# Patient Record
Sex: Female | Born: 1940 | Race: White | Hispanic: No
Health system: Southern US, Academic
[De-identification: ages and names within clinical notes are randomized; demographics above are authoritative.]

## PROBLEM LIST (undated history)

## (undated) ENCOUNTER — Encounter: Attending: Hematology & Oncology | Primary: Hematology & Oncology

## (undated) ENCOUNTER — Telehealth: Attending: Adult Health | Primary: Adult Health

## (undated) ENCOUNTER — Encounter

## (undated) ENCOUNTER — Ambulatory Visit: Payer: MEDICARE

## (undated) ENCOUNTER — Ambulatory Visit

## (undated) ENCOUNTER — Encounter
Attending: Student in an Organized Health Care Education/Training Program | Primary: Student in an Organized Health Care Education/Training Program

## (undated) ENCOUNTER — Telehealth

## (undated) ENCOUNTER — Ambulatory Visit: Payer: Medicare (Managed Care)

## (undated) ENCOUNTER — Ambulatory Visit
Payer: Medicare (Managed Care) | Attending: Student in an Organized Health Care Education/Training Program | Primary: Student in an Organized Health Care Education/Training Program

## (undated) ENCOUNTER — Other Ambulatory Visit

## (undated) ENCOUNTER — Encounter: Attending: Family | Primary: Family

## (undated) ENCOUNTER — Telehealth: Attending: Hematology & Oncology | Primary: Hematology & Oncology

## (undated) ENCOUNTER — Ambulatory Visit: Payer: MEDICARE | Attending: Hematology & Oncology | Primary: Hematology & Oncology

## (undated) ENCOUNTER — Encounter: Attending: Adult Health | Primary: Adult Health

## (undated) ENCOUNTER — Ambulatory Visit: Payer: Medicare (Managed Care) | Attending: Adult Health | Primary: Adult Health

## (undated) ENCOUNTER — Telehealth
Attending: Student in an Organized Health Care Education/Training Program | Primary: Student in an Organized Health Care Education/Training Program

## (undated) ENCOUNTER — Ambulatory Visit: Payer: MEDICARE | Attending: Adult Health | Primary: Adult Health

## (undated) ENCOUNTER — Encounter: Attending: Family Medicine | Primary: Family Medicine

## (undated) ENCOUNTER — Other Ambulatory Visit: Payer: Medicare (Managed Care)

## (undated) ENCOUNTER — Telehealth: Attending: Family Medicine | Primary: Family Medicine

## (undated) ENCOUNTER — Ambulatory Visit
Payer: MEDICARE | Attending: Student in an Organized Health Care Education/Training Program | Primary: Student in an Organized Health Care Education/Training Program

## (undated) ENCOUNTER — Ambulatory Visit: Payer: MEDICARE | Attending: Internal Medicine | Primary: Internal Medicine

## (undated) ENCOUNTER — Inpatient Hospital Stay: Payer: Medicare (Managed Care)

## (undated) ENCOUNTER — Ambulatory Visit: Payer: Medicare (Managed Care) | Attending: Family Medicine | Primary: Family Medicine

## (undated) DIAGNOSIS — I10 Essential (primary) hypertension: Secondary | ICD-10-CM

## (undated) DIAGNOSIS — E119 Type 2 diabetes mellitus without complications: Secondary | ICD-10-CM

## (undated) HISTORY — PX: FOOT SURGERY: SHX648

## (undated) HISTORY — PX: CHOLECYSTECTOMY: SHX55

## (undated) HISTORY — PX: ABDOMINAL HYSTERECTOMY: SHX81

## (undated) HISTORY — PX: BACK SURGERY: SHX140

---

## 2019-07-15 ENCOUNTER — Ambulatory Visit: Admit: 2019-07-15 | Discharge: 2019-07-16 | Payer: MEDICARE

## 2019-07-15 DIAGNOSIS — E785 Hyperlipidemia, unspecified: Principal | ICD-10-CM

## 2019-07-15 DIAGNOSIS — R899 Unspecified abnormal finding in specimens from other organs, systems and tissues: Principal | ICD-10-CM

## 2019-07-15 DIAGNOSIS — I2510 Atherosclerotic heart disease of native coronary artery without angina pectoris: Principal | ICD-10-CM

## 2019-07-15 DIAGNOSIS — I10 Essential (primary) hypertension: Principal | ICD-10-CM

## 2019-07-15 DIAGNOSIS — E119 Type 2 diabetes mellitus without complications: Principal | ICD-10-CM

## 2019-07-15 MED ORDER — ATORVASTATIN 10 MG TABLET
ORAL_TABLET | 3 refills | 0 days | Status: CP
Start: 2019-07-15 — End: ?

## 2019-07-17 MED ORDER — LISINOPRIL 20 MG TABLET
ORAL_TABLET | Freq: Every day | ORAL | 0 refills | 90 days | Status: CP
Start: 2019-07-17 — End: ?

## 2019-07-17 MED ORDER — METOPROLOL SUCCINATE ER 50 MG TABLET,EXTENDED RELEASE 24 HR
ORAL_TABLET | Freq: Every day | ORAL | 0 refills | 90.00000 days | Status: CP
Start: 2019-07-17 — End: ?

## 2019-07-17 MED ORDER — ASPIRIN 81 MG TABLET,DELAYED RELEASE
ORAL_TABLET | Freq: Every day | ORAL | 0 refills | 90 days | Status: CP
Start: 2019-07-17 — End: ?

## 2019-07-17 MED ORDER — AMLODIPINE 5 MG TABLET
ORAL_TABLET | Freq: Every day | ORAL | 0 refills | 90.00000 days | Status: CP
Start: 2019-07-17 — End: ?

## 2019-07-20 ENCOUNTER — Ambulatory Visit
Admit: 2019-07-20 | Discharge: 2019-07-31 | Disposition: A | Discharge status: Skilled Nursing Facility | Payer: MEDICARE | Admitting: Internal Medicine

## 2019-07-20 ENCOUNTER — Other Ambulatory Visit: Payer: Self-pay

## 2019-07-20 ENCOUNTER — Emergency Department
Admission: EM | Admit: 2019-07-20 | Discharge: 2019-07-20 | Disposition: A | Discharge status: Home / Self Care | Payer: Medicare HMO | Attending: Emergency Medicine | Admitting: Emergency Medicine

## 2019-07-20 ENCOUNTER — Encounter: Payer: Self-pay | Admitting: Emergency Medicine

## 2019-07-20 DIAGNOSIS — R1010 Upper abdominal pain, unspecified: Secondary | ICD-10-CM | POA: Insufficient documentation

## 2019-07-20 DIAGNOSIS — R109 Unspecified abdominal pain: Secondary | ICD-10-CM

## 2019-07-20 DIAGNOSIS — Z531 Procedure and treatment not carried out because of patient's decision for reasons of belief and group pressure: Secondary | ICD-10-CM | POA: Diagnosis not present

## 2019-07-20 DIAGNOSIS — R7989 Other specified abnormal findings of blood chemistry: Principal | ICD-10-CM

## 2019-07-20 HISTORY — DX: Type 2 diabetes mellitus without complications: E11.9

## 2019-07-20 HISTORY — DX: Essential (primary) hypertension: I10

## 2019-07-20 LAB — CBC
HCT: 46.4 % — ABNORMAL HIGH (ref 36.0–46.0)
Hemoglobin: 14.7 g/dL (ref 12.0–15.0)
MCH: 28.9 pg (ref 26.0–34.0)
MCHC: 31.7 g/dL (ref 30.0–36.0)
MCV: 91.2 fL (ref 80.0–100.0)
Platelets: 239 10*3/uL (ref 150–400)
RBC: 5.09 MIL/uL (ref 3.87–5.11)
RDW: 13.9 % (ref 11.5–15.5)
WBC: 3.2 10*3/uL — ABNORMAL LOW (ref 4.0–10.5)
nRBC: 0 % (ref 0.0–0.2)

## 2019-07-20 LAB — COMPREHENSIVE METABOLIC PANEL
ALT: 406 U/L — ABNORMAL HIGH (ref 0–44)
AST: 699 U/L — ABNORMAL HIGH (ref 15–41)
Albumin: 4.2 g/dL (ref 3.5–5.0)
Alkaline Phosphatase: 48 U/L (ref 38–126)
Anion gap: 7 (ref 5–15)
BUN: 21 mg/dL (ref 8–23)
CO2: 23 mmol/L (ref 22–32)
Calcium: 9.5 mg/dL (ref 8.9–10.3)
Chloride: 109 mmol/L (ref 98–111)
Creatinine, Ser: 0.8 mg/dL (ref 0.44–1.00)
GFR calc Af Amer: >60 mL/min (ref >60)
GFR calc non Af Amer: >60 mL/min (ref >60)
Glucose, Bld: 171 mg/dL — ABNORMAL HIGH (ref 70–99)
Potassium: 3.8 mmol/L (ref 3.5–5.1)
Sodium: 139 mmol/L (ref 135–145)
Total Bilirubin: 2 mg/dL — ABNORMAL HIGH (ref 0.3–1.2)
Total Protein: 7.1 g/dL (ref 6.5–8.1)

## 2019-07-20 LAB — LIPASE, BLOOD: Lipase: 3038 U/L — ABNORMAL HIGH (ref 11–51)

## 2019-07-20 MED ORDER — ONDANSETRON 4 MG PO TBDP
4.0000 mg | ORAL_TABLET | Freq: Once | ORAL | Status: AC | PRN
  Administered 2019-07-20: 4 mg via ORAL
  Filled 2019-07-20: qty 1

## 2019-07-20 NOTE — ED Notes (Addendum)
Patient's son here stating he is going to take patient to [facility]. Encouraged patient and son to wait to be seen and have US done. Son adamant that they are going to [facility] since patient had been waiting here for 2 hours.

## 2019-07-20 NOTE — ED Triage Notes (Signed)
Patient with complaint of upper abdominal pain that started about 23:00 last night. Patient denies nausea and vomiting.

## 2019-07-31 MED ORDER — FENOFIBRATE NANOCRYSTALLIZED 145 MG TABLET
ORAL_TABLET | Freq: Every day | ORAL | 0 refills | 30 days
Start: 2019-07-31 — End: 2019-08-30

## 2019-07-31 MED ORDER — DIPHENHYDRAMINE 12.5 MG CHEWABLE TABLET
ORAL_TABLET | Freq: Two times a day (BID) | ORAL | 0 refills | 15.00000 days | Status: CP | PRN
Start: 2019-07-31 — End: 2019-08-30

## 2019-07-31 MED ORDER — ACETAMINOPHEN 500 MG TABLET
Freq: Four times a day (QID) | ORAL | 0 days | PRN
Start: 2019-07-31 — End: ?

## 2019-07-31 MED ORDER — LORAZEPAM 0.5 MG TABLET
ORAL_TABLET | Freq: Every evening | ORAL | 30.00000 days | PRN
Start: 2019-07-31 — End: ?

## 2019-07-31 MED ORDER — POLYETHYLENE GLYCOL 3350 17 GRAM ORAL POWDER PACKET
Freq: Every day | ORAL | 0 refills | 0.00000 days | PRN
Start: 2019-07-31 — End: 2019-08-30

## 2019-07-31 MED ORDER — AMLODIPINE 5 MG TABLET
Freq: Every day | ORAL | 0 refills | 0 days
Start: 2019-07-31 — End: ?

## 2019-07-31 MED ORDER — SENNOSIDES 8.6 MG TABLET
Freq: Every evening | ORAL | 0 refills | 0.00000 days | PRN
Start: 2019-07-31 — End: 2019-08-30

## 2019-07-31 MED ORDER — METOPROLOL TARTRATE 25 MG TABLET
ORAL_TABLET | Freq: Two times a day (BID) | ORAL | 0 refills | 30 days
Start: 2019-07-31 — End: 2019-08-30

## 2019-07-31 MED ORDER — CALCIUM CARBONATE 200 MG CALCIUM (500 MG) CHEWABLE TABLET
Freq: Two times a day (BID) | ORAL | 0 refills | 0.00000 days | PRN
Start: 2019-07-31 — End: 2019-08-30

## 2019-07-31 MED ORDER — ALBUTEROL SULFATE HFA 90 MCG/ACTUATION AEROSOL INHALER
Freq: Three times a day (TID) | RESPIRATORY_TRACT | 0 refills | 0.00000 days | PRN
Start: 2019-07-31 — End: ?

## 2019-07-31 MED ORDER — GUAIFENESIN ER 600 MG TABLET, EXTENDED RELEASE 12 HR
Freq: Two times a day (BID) | ORAL | 0 refills | 0.00000 days | PRN
Start: 2019-07-31 — End: ?

## 2019-07-31 MED ORDER — OXYCODONE-ACETAMINOPHEN 5 MG-325 MG TABLET
ORAL_TABLET | Freq: Three times a day (TID) | ORAL | 0 refills | 7 days | Status: CP | PRN
Start: 2019-07-31 — End: 2019-08-07

## 2019-07-31 MED ORDER — ATORVASTATIN 10 MG TABLET
Freq: Every day | ORAL | 0 days
Start: 2019-07-31 — End: ?

## 2019-08-01 MED ORDER — FUROSEMIDE 20 MG TABLET
ORAL | 0 refills | 0.00000 days
Start: 2019-08-01 — End: 2019-08-31

## 2019-08-01 MED ORDER — PANTOPRAZOLE 40 MG TABLET,DELAYED RELEASE
ORAL_TABLET | Freq: Every day | ORAL | 0 refills | 30.00000 days | Status: CP
Start: 2019-08-01 — End: 2019-08-31

## 2019-08-01 MED ORDER — FLUTICASONE PROPIONATE 50 MCG/ACTUATION NASAL SPRAY,SUSPENSION
Freq: Every day | NASAL | 0 refills | 0 days
Start: 2019-08-01 — End: 2020-07-31

## 2019-08-13 ENCOUNTER — Ambulatory Visit: Admit: 2019-08-13 | Discharge: 2019-08-14 | Payer: MEDICARE

## 2019-08-17 DIAGNOSIS — E118 Type 2 diabetes mellitus with unspecified complications: Principal | ICD-10-CM

## 2019-08-17 DIAGNOSIS — E785 Hyperlipidemia, unspecified: Principal | ICD-10-CM

## 2019-08-17 DIAGNOSIS — I2510 Atherosclerotic heart disease of native coronary artery without angina pectoris: Principal | ICD-10-CM

## 2019-08-17 DIAGNOSIS — D72819 Decreased white blood cell count, unspecified: Principal | ICD-10-CM

## 2019-08-18 ENCOUNTER — Other Ambulatory Visit: Admit: 2019-08-18 | Discharge: 2019-08-19 | Payer: MEDICARE

## 2019-08-18 DIAGNOSIS — E785 Hyperlipidemia, unspecified: Principal | ICD-10-CM

## 2019-08-18 DIAGNOSIS — E118 Type 2 diabetes mellitus with unspecified complications: Principal | ICD-10-CM

## 2019-08-18 DIAGNOSIS — I5023 Acute on chronic systolic (congestive) heart failure: Principal | ICD-10-CM

## 2019-08-18 DIAGNOSIS — D72819 Decreased white blood cell count, unspecified: Principal | ICD-10-CM

## 2019-08-18 DIAGNOSIS — N3000 Acute cystitis without hematuria: Principal | ICD-10-CM

## 2019-08-18 DIAGNOSIS — R899 Unspecified abnormal finding in specimens from other organs, systems and tissues: Principal | ICD-10-CM

## 2019-08-19 DIAGNOSIS — I2510 Atherosclerotic heart disease of native coronary artery without angina pectoris: Principal | ICD-10-CM

## 2019-08-20 DIAGNOSIS — N3000 Acute cystitis without hematuria: Principal | ICD-10-CM

## 2019-08-20 DIAGNOSIS — D649 Anemia, unspecified: Principal | ICD-10-CM

## 2019-08-20 MED ORDER — PANTOPRAZOLE 40 MG TABLET,DELAYED RELEASE
ORAL_TABLET | Freq: Every day | ORAL | 0 refills | 30.00000 days | Status: CP
Start: 2019-08-20 — End: 2019-09-19

## 2019-08-20 MED ORDER — NITROFURANTOIN MONOHYDRATE/MACROCRYSTALS 100 MG CAPSULE
ORAL_CAPSULE | Freq: Two times a day (BID) | ORAL | 0 refills | 10 days | Status: CP
Start: 2019-08-20 — End: 2019-08-30

## 2019-08-24 DIAGNOSIS — R899 Unspecified abnormal finding in specimens from other organs, systems and tissues: Principal | ICD-10-CM

## 2019-09-27 ENCOUNTER — Other Ambulatory Visit: Payer: Self-pay

## 2019-09-27 ENCOUNTER — Ambulatory Visit
Admission: EM | Admit: 2019-09-27 | Discharge: 2019-09-27 | Disposition: A | Discharge status: Home / Self Care | Payer: Medicare HMO

## 2019-09-27 DIAGNOSIS — H6982 Other specified disorders of Eustachian tube, left ear: Secondary | ICD-10-CM | POA: Diagnosis not present

## 2019-09-27 NOTE — ED Triage Notes (Signed)
Patient complains of left ear pain that started last week. States that she has been "doctoring" on it all week without any relief.

## 2019-09-27 NOTE — ED Provider Notes (Signed)
[facility]    CSN: [id number] Arrival date & time: 09/27/19  1109      History   Chief Complaint Chief Complaint  Patient presents with  . Otalgia    left    HPI Jacqueline Arroyo is a 79 y.o. female.   79 yo female here for evaluation of left ear pain, fullness, and muffled hearing x 1 week.   She denies fever, chills, or cough. She has had a runny nose, sneezing, and PND.      Past Medical History:  Diagnosis Date  . Diabetes mellitus without complication ([place])   . Hypertension     There are no problems to display for this patient.   Past Surgical History:  Procedure Laterality Date  . ABDOMINAL HYSTERECTOMY    . BACK SURGERY    . CHOLECYSTECTOMY    . FOOT SURGERY      OB History   No obstetric history on file.      Home Medications    Prior to Admission medications   Medication Sig Start Date End Date Taking? Authorizing Provider  albuterol (VENTOLIN HFA) 108 (90 Base) MCG/ACT inhaler Inhale into the lungs. 07/12/19  Yes [provider]  amLODipine (NORVASC) 5 MG tablet  07/20/19  Yes [provider]  aspirin 81 MG EC tablet Take by mouth. 07/17/19  Yes [provider]  atorvastatin (LIPITOR) 10 MG tablet Take 5 mg by mouth at bedtime. 08/24/19  Yes [provider]  calcium-vitamin D (OSCAL WITH D) 500-200 MG-UNIT TABS tablet Take by mouth.   Yes [provider]  fenofibrate (TRICOR) 145 MG tablet Take 145 mg by mouth daily. 08/10/19  Yes [provider]  fluticasone (FLONASE) 50 MCG/ACT nasal spray Place into both nostrils. 08/19/19  Yes [provider]  furosemide (LASIX) 20 MG tablet Take by mouth. 07/23/19  Yes [provider]  lisinopril (ZESTRIL) 20 MG tablet Take 20 mg by mouth 2 (two) times daily. 06/26/19  Yes [provider]  LORazepam (ATIVAN) 0.5 MG tablet Take by mouth. 07/31/19  Yes [provider]  metFORMIN (GLUCOPHAGE-XR) 500 MG 24 hr tablet Take  500 mg by mouth daily. 07/12/19  Yes [provider]  metoprolol succinate (TOPROL-XL) 50 MG 24 hr tablet Take 50 mg by mouth 2 (two) times daily. 09/08/19  Yes [provider]    Family History History reviewed. No pertinent family history.  Social History Social History   Tobacco Use  . Smoking status: Never Smoker  . Smokeless tobacco: Never Used  Vaping Use  . Vaping Use: Never used  Substance Use Topics  . Alcohol use: Never  . Drug use: Never     Allergies   Ciprofloxacin and Keflex [cephalexin]   Review of Systems Review of Systems  Constitutional: Negative for activity change, appetite change, chills and fever.  HENT: Positive for postnasal drip and rhinorrhea. Negative for congestion and sore throat.   Respiratory: Negative for cough and shortness of breath.   Cardiovascular: Negative for chest pain.  Gastrointestinal: Negative for nausea and vomiting.  Genitourinary: Negative for difficulty urinating and frequency.  Musculoskeletal: Negative for arthralgias and neck pain.  Skin: Negative.   Neurological: Negative for dizziness, light-headedness and headaches.  Hematological: Negative.   Psychiatric/Behavioral: Negative.      Physical Exam Triage Vital Signs ED Triage Vitals [09/27/19 1154]  Enc Vitals Group     BP      Pulse      Resp  Temp      Temp src      SpO2      Weight 174 lb (78.9 kg)     Height 5' (1.524 m)     Head Circumference      Peak Flow      Pain Score 6     Pain Loc      Pain Edu?      Excl. in [place]?    No data found.  Updated Vital Signs Ht 5' (1.524 m)   Wt 174 lb (78.9 kg)   BMI 33.98 kg/m   Visual Acuity Right Eye Distance:   Left Eye Distance:   Bilateral Distance:    Right Eye Near:   Left Eye Near:    Bilateral Near:     Physical Exam Vitals and nursing note reviewed.  Constitutional:      Appearance: Normal appearance.  HENT:     Head: Normocephalic and atraumatic.     Right Ear:  Tympanic membrane, ear canal and external ear normal.     Left Ear: Ear canal and external ear normal.     Ears:     Comments: There is a small serous effusion on the left side. The TM is mildly red but it is not bulging and there is a good light reflex. Patient has pain when milking the eustachian tube externally on the left as well.  Cardiovascular:     Rate and Rhythm: Normal rate and regular rhythm.     Pulses: Normal pulses.     Heart sounds: Normal heart sounds.  Pulmonary:     Effort: Pulmonary effort is normal.     Breath sounds: Normal breath sounds.  Musculoskeletal:        General: Normal range of motion.  Skin:    General: Skin is warm and dry.     Capillary Refill: Capillary refill takes less than 2 seconds.  Neurological:     General: No focal deficit present.     Mental Status: She is alert and oriented to person, place, and time.  Psychiatric:        Mood and Affect: Mood normal.        Behavior: Behavior normal.      UC Treatments / Results  Labs (all labs ordered are listed, but only abnormal results are displayed) Labs Reviewed - No data to display  EKG   Radiology No results found.  Procedures Procedures (including critical care time)  Medications Ordered in UC Medications - No data to display  Initial Impression / Assessment and Plan / UC Course  I have reviewed the triage vital signs and the nursing notes.  Pertinent labs & imaging results that were available during my care of the patient were reviewed by me and considered in my medical decision making (see chart for details).   Patient is here for evaluation of fullness in he left ear with associated pain behind her left jaw and muffled hearing.  She has been using Dimetapp and ear drops at home without relief. She has allergies and a runny nose that she uses Flonase for but it is not helping.   PE reveals a scant serous effusion and mild redness to the left TM. Will treat with Flonase and  sinus rinse for Eustachian tube dysfunction.   Final Clinical Impressions(s) / UC Diagnoses   Final diagnoses:  Eustachian tube dysfunction, left     Discharge Instructions     Continue your Dimetapp and Flonase.  Use a Neilmed sinus rinse kit with distilled water 2-3 times a day to rinse your sinuses and also clear out the mucous causing your ear fullness and pain behind your jaw. Also, continue to pop your ears as shown by me to help clear the tube that drains your ear.   If your symptoms are not getting better see your PCP.     ED Prescriptions    None     PDMP not reviewed this encounter.   Margarette Canada, NP 09/27/19 1350

## 2019-09-27 NOTE — Discharge Instructions (Addendum)
Continue your Dimetapp and Flonase.  Use a Neilmed sinus rinse kit with distilled water 2-3 times a day to rinse your sinuses and also clear out the mucous causing your ear fullness and pain behind your jaw. Also, continue to pop your ears as shown by me to help clear the tube that drains your ear.   If your symptoms are not getting better see your PCP.

## 2019-10-01 ENCOUNTER — Ambulatory Visit: Admit: 2019-10-01 | Discharge: 2019-10-02 | Payer: MEDICARE | Attending: Family | Primary: Family

## 2019-10-01 DIAGNOSIS — D649 Anemia, unspecified: Principal | ICD-10-CM

## 2019-10-01 DIAGNOSIS — G250 Essential tremor: Principal | ICD-10-CM

## 2019-10-01 DIAGNOSIS — R899 Unspecified abnormal finding in specimens from other organs, systems and tissues: Principal | ICD-10-CM

## 2019-10-01 DIAGNOSIS — R739 Hyperglycemia, unspecified: Principal | ICD-10-CM

## 2019-10-01 DIAGNOSIS — K8500 Idiopathic acute pancreatitis without necrosis or infection: Principal | ICD-10-CM

## 2019-10-01 DIAGNOSIS — I10 Essential (primary) hypertension: Principal | ICD-10-CM

## 2019-10-01 DIAGNOSIS — Z23 Encounter for immunization: Principal | ICD-10-CM

## 2019-10-01 MED ORDER — METOPROLOL TARTRATE 25 MG TABLET
ORAL_TABLET | Freq: Two times a day (BID) | ORAL | 3 refills | 30 days | Status: CP
Start: 2019-10-01 — End: 2019-10-31

## 2019-10-01 MED ORDER — FUROSEMIDE 20 MG TABLET
ORAL_TABLET | ORAL | 3 refills | 105.00000 days | Status: CP
Start: 2019-10-01 — End: 2019-10-31

## 2019-10-01 MED ORDER — LORAZEPAM 0.5 MG TABLET
ORAL_TABLET | Freq: Every evening | ORAL | 3 refills | 30.00000 days | Status: CP | PRN
Start: 2019-10-01 — End: ?

## 2019-10-01 MED ORDER — LISINOPRIL 20 MG TABLET
ORAL_TABLET | Freq: Every day | ORAL | 3 refills | 90.00000 days | Status: CP
Start: 2019-10-01 — End: ?

## 2019-10-01 MED ORDER — AMLODIPINE 5 MG TABLET
ORAL_TABLET | Freq: Every day | ORAL | 3 refills | 90.00000 days | Status: CP
Start: 2019-10-01 — End: ?

## 2019-10-01 MED ORDER — METFORMIN 500 MG TABLET
ORAL_TABLET | Freq: Every day | ORAL | 3 refills | 90 days | Status: CP
Start: 2019-10-01 — End: ?

## 2019-10-01 MED ORDER — BLOOD GLUCOSE TEST STRIPS
ORAL_STRIP | Freq: Two times a day (BID) | 3 refills | 0.00000 days | Status: CP
Start: 2019-10-01 — End: 2019-12-30

## 2019-10-09 ENCOUNTER — Ambulatory Visit: Admit: 2019-10-09 | Discharge: 2019-10-10 | Payer: MEDICARE

## 2019-10-09 MED ORDER — SPIRONOLACTONE 25 MG TABLET
ORAL_TABLET | Freq: Every day | ORAL | 6 refills | 30 days | Status: CP
Start: 2019-10-09 — End: 2019-11-08

## 2019-10-15 ENCOUNTER — Ambulatory Visit: Admit: 2019-10-15 | Discharge: 2019-10-15 | Payer: MEDICARE

## 2019-10-15 ENCOUNTER — Institutional Professional Consult (permissible substitution): Admit: 2019-10-15 | Discharge: 2019-10-15 | Payer: MEDICARE

## 2019-10-15 DIAGNOSIS — Z794 Long term (current) use of insulin: Principal | ICD-10-CM

## 2019-10-15 DIAGNOSIS — E119 Type 2 diabetes mellitus without complications: Secondary | ICD-10-CM

## 2019-10-15 DIAGNOSIS — Z0000 Encounter for general adult medical examination without abnormal findings: Principal | ICD-10-CM

## 2019-10-19 ENCOUNTER — Ambulatory Visit: Admit: 2019-10-19 | Discharge: 2019-10-20 | Payer: MEDICARE

## 2019-11-02 DIAGNOSIS — I428 Other cardiomyopathies: Principal | ICD-10-CM

## 2019-11-02 DIAGNOSIS — D72819 Decreased white blood cell count, unspecified: Principal | ICD-10-CM

## 2019-11-02 DIAGNOSIS — E119 Type 2 diabetes mellitus without complications: Principal | ICD-10-CM

## 2019-11-02 DIAGNOSIS — K8500 Idiopathic acute pancreatitis without necrosis or infection: Principal | ICD-10-CM

## 2019-11-02 DIAGNOSIS — G250 Essential tremor: Principal | ICD-10-CM

## 2019-11-02 DIAGNOSIS — E118 Type 2 diabetes mellitus with unspecified complications: Principal | ICD-10-CM

## 2019-11-02 DIAGNOSIS — Z8579 Personal history of other malignant neoplasms of lymphoid, hematopoietic and related tissues: Principal | ICD-10-CM

## 2019-11-02 DIAGNOSIS — Z794 Long term (current) use of insulin: Principal | ICD-10-CM

## 2019-11-02 DIAGNOSIS — L989 Disorder of the skin and subcutaneous tissue, unspecified: Principal | ICD-10-CM

## 2019-11-02 DIAGNOSIS — I2510 Atherosclerotic heart disease of native coronary artery without angina pectoris: Principal | ICD-10-CM

## 2019-11-02 DIAGNOSIS — I10 Essential (primary) hypertension: Principal | ICD-10-CM

## 2019-11-02 DIAGNOSIS — M1990 Unspecified osteoarthritis, unspecified site: Principal | ICD-10-CM

## 2019-11-02 DIAGNOSIS — E785 Hyperlipidemia, unspecified: Principal | ICD-10-CM

## 2019-11-02 MED ORDER — BLOOD GLUCOSE TEST STRIPS: strip | 3 refills | 0 days | Status: AC

## 2019-11-02 MED ORDER — BLOOD GLUCOSE TEST STRIPS
ORAL_STRIP | Freq: Two times a day (BID) | 3 refills | 0.00000 days | Status: CP
Start: 2019-11-02 — End: 2019-11-02

## 2019-11-02 MED ORDER — BLOOD GLUCOSE TEST STRIPS: strip | Freq: Two times a day (BID) | 3 refills | 0 days | Status: AC

## 2019-11-02 MED ORDER — LANCETS
0 refills | 0 days | Status: CP
Start: 2019-11-02 — End: 2020-01-27

## 2019-11-12 ENCOUNTER — Ambulatory Visit: Admit: 2019-11-12 | Discharge: 2019-11-13 | Payer: MEDICARE

## 2019-11-23 ENCOUNTER — Ambulatory Visit: Admit: 2019-11-23 | Discharge: 2019-11-24 | Payer: MEDICARE

## 2019-11-23 DIAGNOSIS — R21 Rash and other nonspecific skin eruption: Principal | ICD-10-CM

## 2019-11-23 MED ORDER — TRIAMCINOLONE ACETONIDE 0.1 % TOPICAL CREAM
Freq: Two times a day (BID) | TOPICAL | 0 refills | 0 days | Status: CP
Start: 2019-11-23 — End: 2020-11-22

## 2019-12-21 DIAGNOSIS — E875 Hyperkalemia: Principal | ICD-10-CM

## 2019-12-21 MED ORDER — LISINOPRIL 20 MG TABLET
ORAL_TABLET | ORAL | 0 refills | 0.00000 days | Status: CP
Start: 2019-12-21 — End: 2019-12-31

## 2019-12-31 ENCOUNTER — Ambulatory Visit: Admit: 2019-12-31 | Discharge: 2020-01-01 | Payer: MEDICARE

## 2019-12-31 DIAGNOSIS — R000 Tachycardia, unspecified: Principal | ICD-10-CM

## 2019-12-31 DIAGNOSIS — I10 Essential (primary) hypertension: Principal | ICD-10-CM

## 2019-12-31 DIAGNOSIS — I5032 Chronic diastolic (congestive) heart failure: Principal | ICD-10-CM

## 2019-12-31 DIAGNOSIS — I2510 Atherosclerotic heart disease of native coronary artery without angina pectoris: Principal | ICD-10-CM

## 2019-12-31 MED ORDER — FENOFIBRATE NANOCRYSTALLIZED 145 MG TABLET
ORAL_TABLET | Freq: Every day | ORAL | 3 refills | 90 days | Status: CP
Start: 2019-12-31 — End: ?

## 2019-12-31 MED ORDER — EMPAGLIFLOZIN 10 MG TABLET
ORAL_TABLET | Freq: Every day | ORAL | 3 refills | 90 days | Status: CP
Start: 2019-12-31 — End: ?

## 2019-12-31 MED ORDER — LISINOPRIL 20 MG TABLET
ORAL_TABLET | Freq: Two times a day (BID) | ORAL | 3 refills | 90 days | Status: CP
Start: 2019-12-31 — End: 2020-01-30

## 2020-01-26 ENCOUNTER — Ambulatory Visit: Admit: 2020-01-26 | Discharge: 2020-01-27 | Payer: MEDICARE | Attending: Family | Primary: Family

## 2020-01-26 DIAGNOSIS — L219 Seborrheic dermatitis, unspecified: Principal | ICD-10-CM

## 2020-01-26 DIAGNOSIS — M1990 Unspecified osteoarthritis, unspecified site: Principal | ICD-10-CM

## 2020-01-26 DIAGNOSIS — E119 Type 2 diabetes mellitus without complications: Principal | ICD-10-CM

## 2020-01-26 DIAGNOSIS — E118 Type 2 diabetes mellitus with unspecified complications: Principal | ICD-10-CM

## 2020-01-26 MED ORDER — KETOCONAZOLE 2 % TOPICAL CREAM
Freq: Every day | TOPICAL | 1 refills | 60 days | Status: CP
Start: 2020-01-26 — End: 2021-01-25

## 2020-01-26 MED ORDER — HYDROXYZINE HCL 10 MG TABLET
ORAL_TABLET | Freq: Three times a day (TID) | ORAL | 3 refills | 10 days | Status: CP | PRN
Start: 2020-01-26 — End: ?

## 2020-01-26 MED ORDER — MELOXICAM 7.5 MG TABLET
ORAL_TABLET | Freq: Every day | ORAL | 3 refills | 30 days | Status: CP
Start: 2020-01-26 — End: ?

## 2020-01-27 DIAGNOSIS — E118 Type 2 diabetes mellitus with unspecified complications: Principal | ICD-10-CM

## 2020-01-27 MED ORDER — LANCETS
0 refills | 0 days | Status: CP
Start: 2020-01-27 — End: ?

## 2020-02-08 ENCOUNTER — Ambulatory Visit: Admit: 2020-02-08 | Discharge: 2020-02-09 | Payer: MEDICARE | Attending: Family | Primary: Family

## 2020-02-08 DIAGNOSIS — E119 Type 2 diabetes mellitus without complications: Principal | ICD-10-CM

## 2020-02-08 DIAGNOSIS — R350 Frequency of micturition: Principal | ICD-10-CM

## 2020-02-08 DIAGNOSIS — M1990 Unspecified osteoarthritis, unspecified site: Principal | ICD-10-CM

## 2020-02-08 DIAGNOSIS — R300 Dysuria: Principal | ICD-10-CM

## 2020-02-08 MED ORDER — NITROFURANTOIN MONOHYDRATE/MACROCRYSTALS 100 MG CAPSULE
ORAL_CAPSULE | Freq: Two times a day (BID) | ORAL | 0 refills | 7 days | Status: CP
Start: 2020-02-08 — End: 2020-02-15

## 2020-02-08 MED ORDER — PREDNISONE 10 MG TABLET
ORAL_TABLET | 0 refills | 0 days | Status: CP
Start: 2020-02-08 — End: ?

## 2020-02-23 DIAGNOSIS — G250 Essential tremor: Principal | ICD-10-CM

## 2020-02-24 ENCOUNTER — Ambulatory Visit: Admit: 2020-02-24 | Discharge: 2020-02-25 | Payer: MEDICARE | Attending: Hematology | Primary: Hematology

## 2020-02-24 DIAGNOSIS — Z8579 Personal history of other malignant neoplasms of lymphoid, hematopoietic and related tissues: Principal | ICD-10-CM

## 2020-02-24 DIAGNOSIS — D72819 Decreased white blood cell count, unspecified: Principal | ICD-10-CM

## 2020-02-24 MED ORDER — LORAZEPAM 0.5 MG TABLET
ORAL_TABLET | Freq: Every evening | ORAL | 0 refills | 15 days | Status: CP | PRN
Start: 2020-02-24 — End: ?

## 2020-03-01 ENCOUNTER — Ambulatory Visit: Admit: 2020-03-01 | Discharge: 2020-03-02 | Payer: MEDICARE

## 2020-03-01 DIAGNOSIS — Z8579 Personal history of other malignant neoplasms of lymphoid, hematopoietic and related tissues: Principal | ICD-10-CM

## 2020-03-01 DIAGNOSIS — L219 Seborrheic dermatitis, unspecified: Principal | ICD-10-CM

## 2020-03-01 DIAGNOSIS — M1990 Unspecified osteoarthritis, unspecified site: Principal | ICD-10-CM

## 2020-03-01 DIAGNOSIS — I2510 Atherosclerotic heart disease of native coronary artery without angina pectoris: Principal | ICD-10-CM

## 2020-03-01 DIAGNOSIS — G250 Essential tremor: Principal | ICD-10-CM

## 2020-03-01 DIAGNOSIS — I5032 Chronic diastolic (congestive) heart failure: Principal | ICD-10-CM

## 2020-03-01 DIAGNOSIS — C884 Extranodal marginal zone B-cell lymphoma of mucosa-associated lymphoid tissue [MALT-lymphoma]: Principal | ICD-10-CM

## 2020-03-01 DIAGNOSIS — D72819 Decreased white blood cell count, unspecified: Principal | ICD-10-CM

## 2020-03-01 DIAGNOSIS — E118 Type 2 diabetes mellitus with unspecified complications: Principal | ICD-10-CM

## 2020-03-01 DIAGNOSIS — I10 Essential (primary) hypertension: Principal | ICD-10-CM

## 2020-03-01 DIAGNOSIS — E785 Hyperlipidemia, unspecified: Principal | ICD-10-CM

## 2020-03-01 MED ORDER — ATORVASTATIN 10 MG TABLET
ORAL_TABLET | Freq: Every day | ORAL | 3 refills | 90 days | Status: CP
Start: 2020-03-01 — End: ?

## 2020-03-01 MED ORDER — FUROSEMIDE 20 MG TABLET
ORAL_TABLET | 1 refills | 0 days | Status: CP
Start: 2020-03-01 — End: ?

## 2020-03-04 ENCOUNTER — Ambulatory Visit: Admit: 2020-03-04 | Discharge: 2020-03-05 | Payer: MEDICARE | Attending: Internal Medicine | Primary: Internal Medicine

## 2020-03-04 MED ORDER — LISINOPRIL 20 MG TABLET
ORAL_TABLET | Freq: Every day | ORAL | 3 refills | 90 days
Start: 2020-03-04 — End: 2021-03-04

## 2020-03-04 MED ORDER — AMLODIPINE 5 MG TABLET
ORAL_TABLET | Freq: Two times a day (BID) | ORAL | 3 refills | 90 days
Start: 2020-03-04 — End: ?

## 2020-03-28 DIAGNOSIS — G250 Essential tremor: Principal | ICD-10-CM

## 2020-03-29 MED ORDER — LORAZEPAM 0.5 MG TABLET
ORAL_TABLET | Freq: Two times a day (BID) | ORAL | 0 refills | 15 days | Status: CP | PRN
Start: 2020-03-29 — End: 2020-04-28

## 2020-04-01 DIAGNOSIS — C884 Extranodal marginal zone B-cell lymphoma of mucosa-associated lymphoid tissue [MALT-lymphoma]: Principal | ICD-10-CM

## 2020-04-08 ENCOUNTER — Other Ambulatory Visit: Admit: 2020-04-08 | Discharge: 2020-04-08 | Payer: MEDICARE

## 2020-04-08 ENCOUNTER — Ambulatory Visit: Admit: 2020-04-08 | Discharge: 2020-04-08 | Payer: MEDICARE

## 2020-04-08 DIAGNOSIS — C884 Extranodal marginal zone B-cell lymphoma of mucosa-associated lymphoid tissue [MALT-lymphoma]: Principal | ICD-10-CM

## 2020-04-27 DIAGNOSIS — G250 Essential tremor: Principal | ICD-10-CM

## 2020-04-27 DIAGNOSIS — E118 Type 2 diabetes mellitus with unspecified complications: Principal | ICD-10-CM

## 2020-04-28 MED ORDER — LORAZEPAM 0.5 MG TABLET
ORAL_TABLET | 0 refills | 0 days | Status: CP
Start: 2020-04-28 — End: ?

## 2020-04-28 MED ORDER — LANCETS
0 refills | 0 days | Status: CP
Start: 2020-04-28 — End: ?

## 2020-04-29 DIAGNOSIS — E118 Type 2 diabetes mellitus with unspecified complications: Principal | ICD-10-CM

## 2020-04-29 MED ORDER — LANCETS
0 refills | 0 days | Status: CP
Start: 2020-04-29 — End: ?

## 2020-05-06 MED ORDER — METOPROLOL SUCCINATE ER 50 MG TABLET,EXTENDED RELEASE 24 HR
ORAL_TABLET | 2 refills | 0 days | Status: CP
Start: 2020-05-06 — End: ?

## 2020-05-09 ENCOUNTER — Ambulatory Visit: Admit: 2020-05-09 | Discharge: 2020-05-10 | Payer: MEDICARE

## 2020-05-09 DIAGNOSIS — N760 Acute vaginitis: Principal | ICD-10-CM

## 2020-05-09 DIAGNOSIS — J302 Other seasonal allergic rhinitis: Principal | ICD-10-CM

## 2020-05-09 DIAGNOSIS — J4520 Mild intermittent asthma, uncomplicated: Principal | ICD-10-CM

## 2020-05-09 MED ORDER — FLUTICASONE PROPIONATE 50 MCG/ACTUATION NASAL SPRAY,SUSPENSION
Freq: Every day | NASAL | 11 refills | 0 days
Start: 2020-05-09 — End: 2021-05-09

## 2020-05-09 MED ORDER — ALBUTEROL SULFATE HFA 90 MCG/ACTUATION AEROSOL INHALER
Freq: Four times a day (QID) | RESPIRATORY_TRACT | 2 refills | 0 days | PRN
Start: 2020-05-09 — End: ?

## 2020-05-09 MED ORDER — FLUCONAZOLE 150 MG TABLET
ORAL_TABLET | Freq: Once | ORAL | 0 refills | 1 days | Status: CP
Start: 2020-05-09 — End: 2020-05-09

## 2020-05-09 MED ORDER — FLUTICASONE PROPIONATE 110 MCG/ACTUATION HFA AEROSOL INHALER
Freq: Two times a day (BID) | RESPIRATORY_TRACT | 3 refills | 0 days | Status: CP
Start: 2020-05-09 — End: 2021-05-09

## 2020-05-25 DIAGNOSIS — G250 Essential tremor: Principal | ICD-10-CM

## 2020-05-25 DIAGNOSIS — I10 Essential (primary) hypertension: Principal | ICD-10-CM

## 2020-05-25 MED ORDER — LISINOPRIL 20 MG TABLET
ORAL_TABLET | Freq: Every day | ORAL | 3 refills | 90 days | Status: CP
Start: 2020-05-25 — End: 2021-05-25

## 2020-05-25 MED ORDER — LORAZEPAM 0.5 MG TABLET
ORAL_TABLET | Freq: Two times a day (BID) | ORAL | 0 refills | 15 days | Status: CP | PRN
Start: 2020-05-25 — End: 2021-05-25

## 2020-06-27 ENCOUNTER — Ambulatory Visit: Admit: 2020-06-27 | Discharge: 2020-06-28 | Payer: MEDICARE

## 2020-06-27 DIAGNOSIS — E118 Type 2 diabetes mellitus with unspecified complications: Principal | ICD-10-CM

## 2020-06-27 DIAGNOSIS — I5032 Chronic diastolic (congestive) heart failure: Principal | ICD-10-CM

## 2020-06-27 DIAGNOSIS — E785 Hyperlipidemia, unspecified: Principal | ICD-10-CM

## 2020-06-27 DIAGNOSIS — G4700 Insomnia, unspecified: Principal | ICD-10-CM

## 2020-06-27 DIAGNOSIS — N952 Postmenopausal atrophic vaginitis: Principal | ICD-10-CM

## 2020-06-27 DIAGNOSIS — G250 Essential tremor: Principal | ICD-10-CM

## 2020-06-27 DIAGNOSIS — Z8579 Personal history of other malignant neoplasms of lymphoid, hematopoietic and related tissues: Principal | ICD-10-CM

## 2020-06-27 DIAGNOSIS — I2510 Atherosclerotic heart disease of native coronary artery without angina pectoris: Principal | ICD-10-CM

## 2020-06-27 DIAGNOSIS — E669 Obesity, unspecified: Principal | ICD-10-CM

## 2020-06-27 DIAGNOSIS — I10 Essential (primary) hypertension: Principal | ICD-10-CM

## 2020-06-27 DIAGNOSIS — M1990 Unspecified osteoarthritis, unspecified site: Principal | ICD-10-CM

## 2020-06-27 MED ORDER — LANCETS
3 refills | 0 days | Status: CP
Start: 2020-06-27 — End: ?

## 2020-06-27 MED ORDER — ESTRADIOL 0.01% (0.1 MG/GRAM) VAGINAL CREAM
3 refills | 0 days | Status: CP
Start: 2020-06-27 — End: ?

## 2020-06-27 MED ORDER — FENOFIBRATE NANOCRYSTALLIZED 145 MG TABLET
ORAL_TABLET | Freq: Every day | ORAL | 3 refills | 90 days | Status: CP
Start: 2020-06-27 — End: ?

## 2020-06-27 MED ORDER — LORAZEPAM 0.5 MG TABLET
ORAL_TABLET | Freq: Two times a day (BID) | ORAL | 0 refills | 30.00000 days | Status: CP | PRN
Start: 2020-06-27 — End: 2021-06-27

## 2020-07-29 ENCOUNTER — Ambulatory Visit: Admit: 2020-07-29 | Discharge: 2020-07-30 | Payer: MEDICARE | Attending: Internal Medicine | Primary: Internal Medicine

## 2020-07-29 MED ORDER — SPIRONOLACTONE 25 MG TABLET
ORAL_TABLET | Freq: Every day | ORAL | 3 refills | 90 days | Status: CP
Start: 2020-07-29 — End: 2021-07-29

## 2020-07-29 MED ORDER — LISINOPRIL 30 MG TABLET
ORAL_TABLET | Freq: Every day | ORAL | 3 refills | 90.00000 days | Status: CP
Start: 2020-07-29 — End: 2021-07-29

## 2020-08-01 DIAGNOSIS — G250 Essential tremor: Principal | ICD-10-CM

## 2020-08-01 DIAGNOSIS — G4700 Insomnia, unspecified: Principal | ICD-10-CM

## 2020-08-02 MED ORDER — LORAZEPAM 0.5 MG TABLET
ORAL_TABLET | 0 refills | 0 days | Status: CP
Start: 2020-08-02 — End: ?

## 2020-09-01 ENCOUNTER — Ambulatory Visit: Admit: 2020-09-01 | Discharge: 2020-09-02 | Payer: MEDICARE | Attending: Adult Health | Primary: Adult Health

## 2020-09-01 MED ORDER — AMLODIPINE 10 MG TABLET
ORAL_TABLET | Freq: Every day | ORAL | 1 refills | 90 days | Status: CP
Start: 2020-09-01 — End: ?

## 2020-09-06 ENCOUNTER — Ambulatory Visit
Admission: EM | Admit: 2020-09-06 | Discharge: 2020-09-06 | Disposition: A | Discharge status: Home / Self Care | Payer: Medicare HMO

## 2020-09-06 ENCOUNTER — Other Ambulatory Visit: Payer: Self-pay

## 2020-09-06 DIAGNOSIS — R0981 Nasal congestion: Secondary | ICD-10-CM | POA: Diagnosis not present

## 2020-09-06 DIAGNOSIS — H6982 Other specified disorders of Eustachian tube, left ear: Secondary | ICD-10-CM | POA: Diagnosis not present

## 2020-09-06 MED ORDER — FLUTICASONE PROPIONATE 50 MCG/ACT NA SUSP: 1.0000 | Freq: Two times a day (BID) | NASAL | 2 refills | Status: DC

## 2020-09-06 MED ORDER — FEXOFENADINE-PSEUDOEPHED ER 60-120 MG PO TB12: 1.0000 | ORAL_TABLET | Freq: Two times a day (BID) | ORAL | 0 refills | Status: DC

## 2020-09-06 NOTE — ED Provider Notes (Signed)
[facility]    CSN: [id number] Arrival date & time: 09/06/20  1113      History   Chief Complaint Chief Complaint  Patient presents with   Otalgia    Left ear    HPI Jacqueline Arroyo is a 80 y.o. female presenting for approximately 4-day history of left-sided ear pain and pressure which radiates down the left side of her neck, muffled hearing, sinus congestion and postnasal drainage.  She does have history of allergies.  She has been taking children's Benadryl without improvement in her symptoms.  She denies any associated fever or cough.  No breathing difficulty, nausea/vomiting or diarrhea.  No sick contacts and no known exposure to COVID-19.  Other past medical history significant for diabetes and hypertension.  She has no other complaints or concerns.  HPI  Past Medical History:  Diagnosis Date   Diabetes mellitus without complication (HCC)    Hypertension     There are no problems to display for this patient.   Past Surgical History:  Procedure Laterality Date   ABDOMINAL HYSTERECTOMY     BACK SURGERY     CHOLECYSTECTOMY     FOOT SURGERY      OB History   No obstetric history on file.      Home Medications    Prior to Admission medications   Medication Sig Start Date End Date Taking? Authorizing Provider  albuterol (VENTOLIN HFA) 108 (90 Base) MCG/ACT inhaler Inhale into the lungs. 07/12/19  Yes [provider]  amLODipine (NORVASC) 10 MG tablet Take 10 mg by mouth daily. 09/01/20  Yes [provider]  aspirin 81 MG EC tablet Take by mouth. 07/17/19  Yes [provider]  atorvastatin (LIPITOR) 10 MG tablet Take 1 tablet by mouth daily. 03/01/20  Yes [provider]  calcium-vitamin D (OSCAL WITH D) 500-200 MG-UNIT TABS tablet Take by mouth.   Yes [provider]  estradiol (ESTRACE) 0.1 MG/GM vaginal cream SMARTSIG:Gram(s) Vaginal 3 Times a Week 08/17/20  Yes [provider]  fenofibrate (TRICOR) 145 MG  tablet Take by mouth. 06/27/20  Yes [provider]  fexofenadine-pseudoephedrine (ALLEGRA-D) 60-120 MG 12 hr tablet Take 1 tablet by mouth every 12 (twelve) hours for 7 days. 09/06/20 09/13/20 Yes Gareth Morgan  FLOVENT HFA 110 MCG/ACT inhaler SMARTSIG:2 Puff(s) By Mouth Twice Daily 05/09/20  Yes [provider]  fluticasone (FLONASE) 50 MCG/ACT nasal spray Place 1 spray into both nostrils 2 (two) times daily. 09/06/20  Yes Eusebio Friendly B, PA-C  furosemide (LASIX) 20 MG tablet Take by mouth. 07/23/19  Yes [provider]  ketoconazole (NIZORAL) 2 % cream Apply topically daily. 03/11/20  Yes [provider]  lisinopril (ZESTRIL) 30 MG tablet Take by mouth. 07/29/20 07/29/21 Yes [provider]  LORazepam (ATIVAN) 0.5 MG tablet Take by mouth. 07/31/19  Yes [provider]  meloxicam (MOBIC) 7.5 MG tablet Take 1 tablet by mouth daily. 01/26/20  Yes [provider]  metFORMIN (GLUCOPHAGE) 500 MG tablet Take 500 mg by mouth daily. 08/31/20  Yes [provider]  metoprolol succinate (TOPROL-XL) 50 MG 24 hr tablet Take 50 mg by mouth 2 (two) times daily. 09/08/19  Yes [provider]  spironolactone (ALDACTONE) 25 MG tablet Take 25 mg by mouth daily. 08/06/20  Yes [provider]    Family History History reviewed. No pertinent family history.  Social History Social History   Tobacco Use   Smoking status: Never   Smokeless  tobacco: Never  Vaping Use   Vaping Use: Never used  Substance Use Topics   Alcohol use: Never   Drug use: Never     Allergies   Ciprofloxacin, Keflex [cephalexin], Hydrocodone-acetaminophen, and Sulfamethoxazole   Review of Systems Review of Systems  Constitutional:  Negative for chills, diaphoresis, fatigue and fever.  HENT:  Positive for congestion, ear pain, hearing loss and postnasal drip. Negative for rhinorrhea, sinus pressure, sinus pain and sore throat.   Respiratory:  Negative  for cough and shortness of breath.   Gastrointestinal:  Negative for nausea and vomiting.  Musculoskeletal:  Positive for neck pain. Negative for arthralgias and myalgias.  Skin:  Negative for rash.  Neurological:  Positive for headaches. Negative for weakness.  Hematological:  Negative for adenopathy.    Physical Exam Triage Vital Signs ED Triage Vitals  Enc Vitals Group     BP --      Pulse --      Resp --      Temp --      Temp src --      SpO2 --      Weight 09/06/20 1254 180 lb (81.6 kg)     Height 09/06/20 1254 5' (1.524 m)     Head Circumference --      Peak Flow --      Pain Score 09/06/20 1253 8     Pain Loc --      Pain Edu? --      Excl. in [place]? --    No data found.  Updated Vital Signs BP 135/76 (BP Location: Left Arm)   Pulse 76   Temp 98.1 F (36.7 C) (Oral)   Resp 18   Ht 5' (1.524 m)   Wt 180 lb (81.6 kg)   SpO2 97%   BMI 35.15 kg/m      Physical Exam Vitals and nursing note reviewed.  Constitutional:      General: She is not in acute distress.    Appearance: Normal appearance. She is not ill-appearing or toxic-appearing.  HENT:     Head: Normocephalic and atraumatic.     Right Ear: Tympanic membrane, ear canal and external ear normal.     Left Ear: Ear canal and external ear normal. A middle ear effusion is present. Tympanic membrane is bulging.     Nose: Congestion present.     Mouth/Throat:     Mouth: Mucous membranes are moist.     Pharynx: Oropharynx is clear. Posterior oropharyngeal erythema (mild with PND) present.  Eyes:     General: No scleral icterus.       Right eye: No discharge.        Left eye: No discharge.     Conjunctiva/sclera: Conjunctivae normal.  Cardiovascular:     Rate and Rhythm: Normal rate and regular rhythm.     Heart sounds: Normal heart sounds.  Pulmonary:     Effort: Pulmonary effort is normal. No respiratory distress.     Breath sounds: Normal breath sounds.  Musculoskeletal:     Cervical back: Neck  supple.  Skin:    General: Skin is dry.  Neurological:     General: No focal deficit present.     Mental Status: She is alert. Mental status is at baseline.     Motor: No weakness.     Gait: Gait normal.  Psychiatric:        Mood and Affect: Mood normal.        Behavior:  Behavior normal.        Thought Content: Thought content normal.     UC Treatments / Results  Labs (all labs ordered are listed, but only abnormal results are displayed) Labs Reviewed - No data to display  EKG   Radiology No results found.  Procedures Procedures (including critical care time)  Medications Ordered in UC Medications - No data to display  Initial Impression / Assessment and Plan / UC Course  I have reviewed the triage vital signs and the nursing notes.  Pertinent labs & imaging results that were available during my care of the patient were reviewed by me and considered in my medical decision making (see chart for details).  80 year old female presenting for left ear pain and fullness as well as muffled hearing, sinus congestion and postnasal drainage.  On exam she does have nasal congestion, mild erythema with postnasal pharynx and postnasal drainage.  Additionally she has mild effusion of left TM with slight bulging but no erythema.  Chest is clear to auscultation heart regular rate and rhythm.  Clinical presentation consistent with effusion of TM with subsequent eustachian tube dysfunction.  Likely related to her underlying allergies.  I have sent in Flonase and Allegra-D.  Reviewed following with primary care provider if symptoms are worsening or not improving.  Final Clinical Impressions(s) / UC Diagnoses   Final diagnoses:  Dysfunction of left eustachian tube  Nasal congestion     Discharge Instructions      -There is a small amount of fluid behind your eardrum.  This is causing the ear pain and pressure as well as ear fullness.  I have sent over Flonase as well as Allegra-D.   Make sure to increase rest and fluids.  You can chew some gum to help pop the ear.  Also consider use of nasal saline rinses, humidifier or breathing and warm steam from the shower. -Follow-up with primary care provider if you are not improving over the next 1 to 2 weeks or symptoms worsen.     ED Prescriptions     Medication Sig Dispense Auth. Provider   fexofenadine-pseudoephedrine (ALLEGRA-D) 60-120 MG 12 hr tablet Take 1 tablet by mouth every 12 (twelve) hours for 7 days. 14 tablet Eusebio Friendly B, PA-C   fluticasone (FLONASE) 50 MCG/ACT nasal spray Place 1 spray into both nostrils 2 (two) times daily. 1 g Shirlee Latch, PA-C      PDMP not reviewed this encounter.   Shirlee Latch, PA-C 09/06/20 1317

## 2020-09-06 NOTE — Discharge Instructions (Addendum)
-  There is a small amount of fluid behind your eardrum.  This is causing the ear pain and pressure as well as ear fullness.  I have sent over Flonase as well as Allegra-D.  Make sure to increase rest and fluids.  You can chew some gum to help pop the ear.  Also consider use of nasal saline rinses, humidifier or breathing and warm steam from the shower. -Follow-up with primary care provider if you are not improving over the next 1 to 2 weeks or symptoms worsen.

## 2020-09-06 NOTE — ED Triage Notes (Signed)
Patient complains of left ear pain its been going on for 4 days, the pain radiates down to her neck. Pt used some old ear drops that she had. Reports sinus congestion, denies fever.

## 2020-09-12 ENCOUNTER — Ambulatory Visit
Admission: EM | Admit: 2020-09-12 | Discharge: 2020-09-12 | Disposition: A | Discharge status: Home / Self Care | Payer: Medicare HMO | Attending: Internal Medicine | Admitting: Internal Medicine

## 2020-09-12 ENCOUNTER — Other Ambulatory Visit: Payer: Self-pay

## 2020-09-12 DIAGNOSIS — H60332 Swimmer's ear, left ear: Secondary | ICD-10-CM

## 2020-09-12 MED ORDER — FEXOFENADINE HCL 180 MG PO TABS: 180.0000 mg | ORAL_TABLET | Freq: Every day | ORAL | 0 refills | Status: DC

## 2020-09-12 MED ORDER — NEOMYCIN-POLYMYXIN-HC 3.5-10000-1 OT SUSP: 3.0000 [drp] | Freq: Three times a day (TID) | OTIC | 0 refills | Status: DC

## 2020-09-12 MED ORDER — TRAMADOL HCL 50 MG PO TABS: 50.0000 mg | ORAL_TABLET | Freq: Four times a day (QID) | ORAL | 0 refills | Status: DC | PRN

## 2020-09-12 NOTE — ED Provider Notes (Signed)
[facility]    CSN: [id number] Arrival date & time: 09/12/20  1339      History   Chief Complaint Chief Complaint  Patient presents with   Otalgia    left    HPI Jacqueline Arroyo is a 80 y.o. female who presents with L ear pain off and on x 1 week. She was prescribed allegra D a few weeks ago when she was here and caused her anxiety and insomnia. Denies having a fever or trouble hearing. Tylenol has not helped and would like to ask for a few Tramadol's    Past Medical History:  Diagnosis Date   Diabetes mellitus without complication (HCC)    Hypertension     There are no problems to display for this patient.   Past Surgical History:  Procedure Laterality Date   ABDOMINAL HYSTERECTOMY     BACK SURGERY     CHOLECYSTECTOMY     FOOT SURGERY      OB History   No obstetric history on file.      Home Medications    Prior to Admission medications   Medication Sig Start Date End Date Taking? Authorizing Provider  albuterol (VENTOLIN HFA) 108 (90 Base) MCG/ACT inhaler Inhale into the lungs. 07/12/19  Yes [provider]  amLODipine (NORVASC) 10 MG tablet Take 10 mg by mouth daily. 09/01/20  Yes [provider]  aspirin 81 MG EC tablet Take by mouth. 07/17/19  Yes [provider]  atorvastatin (LIPITOR) 10 MG tablet Take 1 tablet by mouth daily. 03/01/20  Yes [provider]  calcium-vitamin D (OSCAL WITH D) 500-200 MG-UNIT TABS tablet Take by mouth.   Yes [provider]  estradiol (ESTRACE) 0.1 MG/GM vaginal cream SMARTSIG:Gram(s) Vaginal 3 Times a Week 08/17/20  Yes [provider]  fenofibrate (TRICOR) 145 MG tablet Take by mouth. 06/27/20  Yes [provider]  fexofenadine (ALLEGRA) 180 MG tablet Take 1 tablet (180 mg total) by mouth daily. 09/12/20  Yes Rodriguez-Southworth, Viviana Simpler  FLOVENT HFA 110 MCG/ACT inhaler SMARTSIG:2 Puff(s) By Mouth Twice Daily 05/09/20  Yes [provider]   fluticasone (FLONASE) 50 MCG/ACT nasal spray Place 1 spray into both nostrils 2 (two) times daily. 09/06/20  Yes Eusebio Friendly B, PA-C  furosemide (LASIX) 20 MG tablet Take by mouth. 07/23/19  Yes [provider]  ketoconazole (NIZORAL) 2 % cream Apply topically daily. 03/11/20  Yes [provider]  lisinopril (ZESTRIL) 30 MG tablet Take by mouth. 07/29/20 07/29/21 Yes [provider]  LORazepam (ATIVAN) 0.5 MG tablet Take by mouth. 07/31/19  Yes [provider]  meloxicam (MOBIC) 7.5 MG tablet Take 1 tablet by mouth daily. 01/26/20  Yes [provider]  metFORMIN (GLUCOPHAGE) 500 MG tablet Take 500 mg by mouth daily. 08/31/20  Yes [provider]  metoprolol succinate (TOPROL-XL) 50 MG 24 hr tablet Take 50 mg by mouth 2 (two) times daily. 09/08/19  Yes [provider]  neomycin-polymyxin-hydrocortisone (CORTISPORIN) 3.5-10000-1 OTIC suspension Place 3 drops into the left ear 3 (three) times daily. For 7 days 09/12/20  Yes Rodriguez-Southworth, Viviana Simpler  spironolactone (ALDACTONE) 25 MG tablet Take 25 mg by mouth daily. 08/06/20  Yes [provider]  traMADol (ULTRAM) 50 MG tablet Take 1 tablet (50 mg total) by mouth every 6 (six) hours as needed. 09/12/20  Yes Rodriguez-Southworth, Viviana Simpler    Family History History reviewed. No pertinent family history.  Social History Social History   Tobacco Use  Smoking status: Never   Smokeless tobacco: Never  Vaping Use   Vaping Use: Never used  Substance Use Topics   Alcohol use: Never   Drug use: Never     Allergies   Ciprofloxacin, Keflex [cephalexin], Hydrocodone-acetaminophen, and Sulfamethoxazole   Review of Systems Review of Systems  Constitutional:  Negative for appetite change, chills, diaphoresis, fatigue and fever.  HENT:  Positive for ear pain, postnasal drip and rhinorrhea. Negative for congestion, ear discharge, sore throat and trouble swallowing.   Eyes:   Negative for discharge.  Respiratory:  Negative for cough.   Skin:  Negative for rash.  Hematological:  Negative for adenopathy.    Physical Exam Triage Vital Signs ED Triage Vitals  Enc Vitals Group     BP 09/12/20 1406 (!) 150/71     Pulse Rate 09/12/20 1406 91     Resp 09/12/20 1406 18     Temp 09/12/20 1406 98.5 F (36.9 C)     Temp Source 09/12/20 1406 Oral     SpO2 09/12/20 1406 95 %     Weight 09/12/20 1403 180 lb (81.6 kg)     Height 09/12/20 1403 5' (1.524 m)     Head Circumference --      Peak Flow --      Pain Score 09/12/20 1406 8     Pain Loc --      Pain Edu? --      Excl. in [place]? --    No data found.  Updated Vital Signs BP (!) 150/71 (BP Location: Left Arm)   Pulse 91   Temp 98.5 F (36.9 C) (Oral)   Resp 18   Ht 5' (1.524 m)   Wt 180 lb (81.6 kg)   SpO2 95%   BMI 35.15 kg/m   Visual Acuity Right Eye Distance:   Left Eye Distance:   Bilateral Distance:    Right Eye Near:   Left Eye Near:    Bilateral Near:     Physical Exam Vitals and nursing note reviewed.  Constitutional:      General: She is not in acute distress.    Appearance: She is not toxic-appearing.  HENT:     Head: Normocephalic.     Right Ear: Tympanic membrane, ear canal and external ear normal.     Left Ear: Tympanic membrane and ear canal normal.     Ears:     Comments: Has tenderness with external ear motion     Nose: Nose normal.  Eyes:     General: No scleral icterus.    Conjunctiva/sclera: Conjunctivae normal.  Cardiovascular:     Rate and Rhythm: Normal rate and regular rhythm.     Heart sounds: No murmur heard. Pulmonary:     Effort: Pulmonary effort is normal.     Breath sounds: Normal breath sounds.  Musculoskeletal:        General: Normal range of motion.     Cervical back: Neck supple.  Lymphadenopathy:     Cervical: No cervical adenopathy.  Skin:    General: Skin is warm and dry.     Findings: No rash.  Neurological:     Mental Status: She is alert  and oriented to person, place, and time.     Gait: Gait normal.  Psychiatric:        Mood and Affect: Mood normal.        Behavior: Behavior normal.        Thought Content: Thought content normal.  Judgment: Judgment normal.     UC Treatments / Results  Labs (all labs ordered are listed, but only abnormal results are displayed) Labs Reviewed - No data to display  EKG   Radiology No results found.  Procedures Procedures (including critical care time)  Medications Ordered in UC Medications - No data to display  Initial Impression / Assessment and Plan / UC Course  I have reviewed the triage vital signs and the nursing notes. Otalgia, L OE I placed her on Cortisporin otic gtts as noted, and switched her Allegra to plain one. I gave her a few Tramadol's for pain.      Final Clinical Impressions(s) / UC Diagnoses   Final diagnoses:  Swimmer's ear of left side, unspecified chronicity   Discharge Instructions   None    ED Prescriptions     Medication Sig Dispense Auth. Provider   neomycin-polymyxin-hydrocortisone (CORTISPORIN) 3.5-10000-1 OTIC suspension Place 3 drops into the left ear 3 (three) times daily. For 7 days 10 mL Rodriguez-Southworth, Nettie Elm, PA-C   fexofenadine (ALLEGRA) 180 MG tablet Take 1 tablet (180 mg total) by mouth daily. 30 tablet Rodriguez-Southworth, Elfa Wooton, PA-C   traMADol (ULTRAM) 50 MG tablet Take 1 tablet (50 mg total) by mouth every 6 (six) hours as needed. 10 tablet Rodriguez-Southworth, Nettie Elm, PA-C      I have reviewed the PDMP during this encounter.   Garey Ham, [place] 09/12/20 1631

## 2020-09-12 NOTE — ED Triage Notes (Signed)
Pt here with C/O left ear pain, for over a week off and one. Unable to take Allegra makes her nervous feeling. Would like an antibiotic.

## 2020-09-13 DIAGNOSIS — G250 Essential tremor: Principal | ICD-10-CM

## 2020-09-13 DIAGNOSIS — G4700 Insomnia, unspecified: Principal | ICD-10-CM

## 2020-09-13 MED ORDER — LORAZEPAM 0.5 MG TABLET
ORAL_TABLET | 0 refills | 0 days | Status: CP
Start: 2020-09-13 — End: ?

## 2020-09-25 ENCOUNTER — Ambulatory Visit
Admit: 2020-09-25 | Discharge: 2020-09-25 | Disposition: A | Discharge status: Home / Self Care | Payer: MEDICARE | Attending: Student in an Organized Health Care Education/Training Program

## 2020-09-25 DIAGNOSIS — H6690 Otitis media, unspecified, unspecified ear: Principal | ICD-10-CM

## 2020-09-25 MED ORDER — AMOXICILLIN 500 MG CAPSULE
ORAL_CAPSULE | Freq: Three times a day (TID) | ORAL | 0 refills | 7 days | Status: CP
Start: 2020-09-25 — End: 2020-10-02

## 2020-09-26 DIAGNOSIS — E118 Type 2 diabetes mellitus with unspecified complications: Principal | ICD-10-CM

## 2020-09-26 MED ORDER — LANCETS
3 refills | 0 days | Status: CP
Start: 2020-09-26 — End: 2021-09-26

## 2020-09-27 DIAGNOSIS — J302 Other seasonal allergic rhinitis: Principal | ICD-10-CM

## 2020-09-27 DIAGNOSIS — J4520 Mild intermittent asthma, uncomplicated: Principal | ICD-10-CM

## 2020-09-27 MED ORDER — FLUTICASONE PROPIONATE 50 MCG/ACTUATION NASAL SPRAY,SUSPENSION
Freq: Every day | NASAL | 11 refills | 0 days | Status: CP
Start: 2020-09-27 — End: 2021-09-27

## 2020-09-27 MED ORDER — ALBUTEROL SULFATE HFA 90 MCG/ACTUATION AEROSOL INHALER
Freq: Four times a day (QID) | RESPIRATORY_TRACT | 2 refills | 0 days | Status: CP | PRN
Start: 2020-09-27 — End: 2021-09-27

## 2020-09-28 DIAGNOSIS — E118 Type 2 diabetes mellitus with unspecified complications: Principal | ICD-10-CM

## 2020-09-28 MED ORDER — LANCETS
3 refills | 0 days | Status: CP
Start: 2020-09-28 — End: 2021-09-28

## 2020-10-03 MED ORDER — SPIRONOLACTONE 25 MG TABLET
ORAL_TABLET | 3 refills | 0 days | Status: CP
Start: 2020-10-03 — End: ?

## 2020-10-06 DIAGNOSIS — H6983 Other specified disorders of Eustachian tube, bilateral: Principal | ICD-10-CM

## 2020-10-11 DIAGNOSIS — C884 Extranodal marginal zone B-cell lymphoma of mucosa-associated lymphoid tissue [MALT-lymphoma]: Principal | ICD-10-CM

## 2020-10-12 ENCOUNTER — Other Ambulatory Visit: Admit: 2020-10-12 | Discharge: 2020-10-13 | Payer: MEDICARE

## 2020-10-12 ENCOUNTER — Ambulatory Visit: Admit: 2020-10-12 | Discharge: 2020-10-13 | Payer: MEDICARE

## 2020-10-12 DIAGNOSIS — C884 Extranodal marginal zone B-cell lymphoma of mucosa-associated lymphoid tissue [MALT-lymphoma]: Principal | ICD-10-CM

## 2020-10-20 DIAGNOSIS — G250 Essential tremor: Principal | ICD-10-CM

## 2020-10-20 DIAGNOSIS — G4700 Insomnia, unspecified: Principal | ICD-10-CM

## 2020-10-20 MED ORDER — LORAZEPAM 0.5 MG TABLET
ORAL_TABLET | 0 refills | 0 days | Status: CP
Start: 2020-10-20 — End: ?

## 2020-10-20 MED ORDER — MELOXICAM 7.5 MG TABLET
ORAL_TABLET | Freq: Every day | ORAL | 0 refills | 30 days | Status: CP | PRN
Start: 2020-10-20 — End: ?

## 2020-10-24 ENCOUNTER — Ambulatory Visit (INDEPENDENT_AMBULATORY_CARE_PROVIDER_SITE_OTHER): Payer: Medicare HMO

## 2020-10-24 ENCOUNTER — Other Ambulatory Visit: Payer: Self-pay

## 2020-10-24 ENCOUNTER — Encounter: Payer: Self-pay | Admitting: Emergency Medicine

## 2020-10-24 ENCOUNTER — Ambulatory Visit
Admission: EM | Admit: 2020-10-24 | Discharge: 2020-10-24 | Disposition: A | Discharge status: Home / Self Care | Payer: Medicare HMO | Attending: Physician Assistant | Admitting: Physician Assistant

## 2020-10-24 DIAGNOSIS — R1084 Generalized abdominal pain: Secondary | ICD-10-CM | POA: Diagnosis present

## 2020-10-24 DIAGNOSIS — R300 Dysuria: Secondary | ICD-10-CM | POA: Diagnosis present

## 2020-10-24 LAB — COMPREHENSIVE METABOLIC PANEL
ALT: 29 U/L (ref 0–44)
AST: 24 U/L (ref 15–41)
Albumin: 4.7 g/dL (ref 3.5–5.0)
Alkaline Phosphatase: 45 U/L (ref 38–126)
Anion gap: 8 (ref 5–15)
BUN: 27 mg/dL — ABNORMAL HIGH (ref 8–23)
CO2: 24 mmol/L (ref 22–32)
Calcium: 10.6 mg/dL — ABNORMAL HIGH (ref 8.9–10.3)
Chloride: 104 mmol/L (ref 98–111)
Creatinine, Ser: 0.81 mg/dL (ref 0.44–1.00)
GFR, Estimated: >60 mL/min (ref >60)
Glucose, Bld: 135 mg/dL — ABNORMAL HIGH (ref 70–99)
Potassium: 5 mmol/L (ref 3.5–5.1)
Sodium: 136 mmol/L (ref 135–145)
Total Bilirubin: 0.6 mg/dL (ref 0.3–1.2)
Total Protein: 7.7 g/dL (ref 6.5–8.1)

## 2020-10-24 LAB — CBC WITH DIFFERENTIAL/PLATELET
Abs Immature Granulocytes: 0.11 10*3/uL — ABNORMAL HIGH (ref 0.00–0.07)
Basophils Absolute: 0 10*3/uL (ref 0.0–0.1)
Basophils Relative: 0 %
Eosinophils Absolute: 0 10*3/uL (ref 0.0–0.5)
Eosinophils Relative: 0 %
HCT: 50.5 % — ABNORMAL HIGH (ref 36.0–46.0)
Hemoglobin: 15.8 g/dL — ABNORMAL HIGH (ref 12.0–15.0)
Immature Granulocytes: 2 %
Lymphocytes Relative: 16 %
Lymphs Abs: 0.8 10*3/uL (ref 0.7–4.0)
MCH: 29 pg (ref 26.0–34.0)
MCHC: 31.3 g/dL (ref 30.0–36.0)
MCV: 92.8 fL (ref 80.0–100.0)
Monocytes Absolute: 0.3 10*3/uL (ref 0.1–1.0)
Monocytes Relative: 6 %
Neutro Abs: 4.1 10*3/uL (ref 1.7–7.7)
Neutrophils Relative %: 76 %
Platelets: 268 10*3/uL (ref 150–400)
RBC: 5.44 MIL/uL — ABNORMAL HIGH (ref 3.87–5.11)
RDW: 14.3 % (ref 11.5–15.5)
WBC: 5.4 10*3/uL (ref 4.0–10.5)
nRBC: 0 % (ref 0.0–0.2)

## 2020-10-24 LAB — URINALYSIS, COMPLETE (UACMP) WITH MICROSCOPIC
Bilirubin Urine: NEGATIVE
Glucose, UA: NEGATIVE mg/dL
Hgb urine dipstick: NEGATIVE
Ketones, ur: NEGATIVE mg/dL
Leukocytes,Ua: NEGATIVE
Nitrite: NEGATIVE
Protein, ur: NEGATIVE mg/dL
Specific Gravity, Urine: 1.025 (ref 1.005–1.030)
pH: 5 (ref 5.0–8.0)

## 2020-10-24 NOTE — ED Triage Notes (Signed)
Pt presents today with lower abdominal pain and pain/burning with urination x 2 days. Denies fever.

## 2020-10-24 NOTE — ED Provider Notes (Signed)
[facility]    CSN: [id number] Arrival date & time: 10/24/20  1010      History   Chief Complaint Chief Complaint  Patient presents with   Dysuria    HPI Jacqueline Arroyo is a 80 y.o. female presenting for worsening abdominal pain for the past 2 days.  Patient says the pain is all over but seems to be worse at the right lower side.  She reports 55-month history of left lower quadrant pain that comes and goes.  Patient says current pain is about 8 out of 10.  She says pain seems to be associated with burning with urination slightly over the past couple days as well.  No urinary frequency or urgency noted.  No reports of vaginal discharge, itching or odor.  Denies any changes with appetite, vomiting or diarrhea.  Last bowel movement was yesterday and patient says it was normal.  She does state that she had some diarrhea a few days ago which has since resolved.  Patient was concerned about recent antibiotics that could have caused the diarrhea.  She had taken amoxicillin for ear infection about a month ago.  Patient denies any associated fevers, fatigue, chills, sweats.  Past medical history significant for diabetes and hypertension.  Patient reports history of cholecystectomy and abdominal hysterectomy.  Patient reports concern for possible kidney stone.  No other complaints.  HPI  Past Medical History:  Diagnosis Date   Diabetes mellitus without complication (HCC)    Hypertension     There are no problems to display for this patient.   Past Surgical History:  Procedure Laterality Date   ABDOMINAL HYSTERECTOMY     BACK SURGERY     CHOLECYSTECTOMY     FOOT SURGERY      OB History   No obstetric history on file.      Home Medications    Prior to Admission medications   Medication Sig Start Date End Date Taking? Authorizing Provider  albuterol (VENTOLIN HFA) 108 (90 Base) MCG/ACT inhaler Inhale into the lungs. 07/12/19   [provider]  amLODipine  (NORVASC) 10 MG tablet Take 10 mg by mouth daily. 09/01/20   [provider]  aspirin 81 MG EC tablet Take by mouth. 07/17/19   [provider]  atorvastatin (LIPITOR) 10 MG tablet Take 1 tablet by mouth daily. 03/01/20   [provider]  calcium-vitamin D (OSCAL WITH D) 500-200 MG-UNIT TABS tablet Take by mouth.    [provider]  estradiol (ESTRACE) 0.1 MG/GM vaginal cream SMARTSIG:Gram(s) Vaginal 3 Times a Week 08/17/20   [provider]  fenofibrate (TRICOR) 145 MG tablet Take by mouth. 06/27/20   [provider]  fexofenadine (ALLEGRA) 180 MG tablet Take 1 tablet (180 mg total) by mouth daily. 09/12/20   Rodriguez-Southworth, Viviana Simpler  FLOVENT HFA 110 MCG/ACT inhaler SMARTSIG:2 Puff(s) By Mouth Twice Daily 05/09/20   [provider]  fluticasone (FLONASE) 50 MCG/ACT nasal spray Place 1 spray into both nostrils 2 (two) times daily. 09/06/20   Shirlee Latch, PA-C  furosemide (LASIX) 20 MG tablet Take by mouth. 07/23/19   [provider]  ketoconazole (NIZORAL) 2 % cream Apply topically daily. 03/11/20   [provider]  lisinopril (ZESTRIL) 30 MG tablet Take by mouth. 07/29/20 07/29/21  [provider]  LORazepam (ATIVAN) 0.5 MG tablet Take by mouth. 07/31/19   [provider]  meloxicam (MOBIC) 7.5 MG tablet Take 1 tablet by mouth daily. 01/26/20  [provider]  metFORMIN (GLUCOPHAGE) 500 MG tablet Take 500 mg by mouth daily. 08/31/20   [provider]  metoprolol succinate (TOPROL-XL) 50 MG 24 hr tablet Take 50 mg by mouth 2 (two) times daily. 09/08/19   [provider]  neomycin-polymyxin-hydrocortisone (CORTISPORIN) 3.5-10000-1 OTIC suspension Place 3 drops into the left ear 3 (three) times daily. For 7 days 09/12/20   Rodriguez-Southworth, Nettie Elm, PA-C  spironolactone (ALDACTONE) 25 MG tablet Take 25 mg by mouth daily. 08/06/20   [provider]  traMADol (ULTRAM) 50 MG  tablet Take 1 tablet (50 mg total) by mouth every 6 (six) hours as needed. 09/12/20   Rodriguez-Southworth, Nettie Elm, PA-C    Family History History reviewed. No pertinent family history.  Social History Social History   Tobacco Use   Smoking status: Never   Smokeless tobacco: Never  Vaping Use   Vaping Use: Never used  Substance Use Topics   Alcohol use: Never   Drug use: Never     Allergies   Ciprofloxacin, Keflex [cephalexin], Hydrocodone-acetaminophen, and Sulfamethoxazole   Review of Systems Review of Systems  Constitutional:  Negative for chills, fatigue and fever.  Respiratory:  Negative for shortness of breath.   Cardiovascular:  Negative for chest pain.  Gastrointestinal:  Positive for abdominal pain. Negative for blood in stool, constipation, diarrhea, nausea and vomiting.  Genitourinary:  Positive for dysuria. Negative for decreased urine volume, flank pain, frequency, hematuria, pelvic pain, urgency, vaginal bleeding, vaginal discharge and vaginal pain.  Musculoskeletal:  Positive for back pain (chronic).  Skin:  Negative for rash.    Physical Exam Triage Vital Signs ED Triage Vitals [10/24/20 1155]  Enc Vitals Group     BP      Pulse      Resp      Temp      Temp src      SpO2      Weight      Height      Head Circumference      Peak Flow      Pain Score 8     Pain Loc      Pain Edu?      Excl. in [place]?    No data found.  Updated Vital Signs BP (!) 157/86 (BP Location: Left Arm)   Pulse 85   Temp 98.1 F (36.7 C) (Oral)   Resp 18   SpO2 97%      Physical Exam Vitals and nursing note reviewed.  Constitutional:      General: She is not in acute distress.    Appearance: Normal appearance. She is not ill-appearing or toxic-appearing.  HENT:     Head: Normocephalic and atraumatic.  Eyes:     General: No scleral icterus.       Right eye: No discharge.        Left eye: No discharge.     Conjunctiva/sclera: Conjunctivae normal.   Cardiovascular:     Rate and Rhythm: Normal rate and regular rhythm.     Heart sounds: Normal heart sounds.  Pulmonary:     Effort: Pulmonary effort is normal. No respiratory distress.     Breath sounds: Normal breath sounds.  Abdominal:     General: Bowel sounds are normal.     Palpations: Abdomen is soft.     Tenderness: There is generalized abdominal tenderness. There is no right CVA tenderness or left CVA tenderness.  Musculoskeletal:     Cervical back: Neck supple.  Skin:  General: Skin is dry.  Neurological:     General: No focal deficit present.     Mental Status: She is alert. Mental status is at baseline.     Motor: No weakness.     Gait: Gait normal.  Psychiatric:        Mood and Affect: Mood normal.        Behavior: Behavior normal.        Thought Content: Thought content normal.     UC Treatments / Results  Labs (all labs ordered are listed, but only abnormal results are displayed) Labs Reviewed  URINALYSIS, COMPLETE (UACMP) WITH MICROSCOPIC - Abnormal; Notable for the following components:      Result Value   Bacteria, UA RARE (*)    All other components within normal limits  COMPREHENSIVE METABOLIC PANEL - Abnormal; Notable for the following components:   Glucose, Bld 135 (*)    BUN 27 (*)    Calcium 10.6 (*)    All other components within normal limits  CBC WITH DIFFERENTIAL/PLATELET - Abnormal; Notable for the following components:   RBC 5.44 (*)    Hemoglobin 15.8 (*)    HCT 50.5 (*)    Abs Immature Granulocytes 0.11 (*)    All other components within normal limits  URINE CULTURE    EKG   Radiology DG Abdomen 1 View  Result Date: 10/24/2020 CLINICAL DATA:  Abdominal pain EXAM: ABDOMEN - 1 VIEW COMPARISON:  None. FINDINGS: Nonobstructive bowel gas pattern with moderate stool burden. Cholecystectomy clips. Calcifications of the upper abdomen, possibly costochondral. No calcifications seen over the expected location of the kidneys or ureters.  Calcifications in the pelvis, likely phleboliths. Degenerative changes of the lumbar spine. Prior vertebral augmentation of the lower thoracic spine. IMPRESSION: Nonobstructive bowel gas pattern with moderate stool burden. No radiographic evidence of nephroureterolithiasis. Electronically Signed   By: Allegra Lai M.D.   On: 10/24/2020 14:02    Procedures Procedures (including critical care time)  Medications Ordered in UC Medications - No data to display  Initial Impression / Assessment and Plan / UC Course  I have reviewed the triage vital signs and the nursing notes.  Pertinent labs & imaging results that were available during my care of the patient were reviewed by me and considered in my medical decision making (see chart for details).  80 year old female presenting for generalized abdominal pain which is worse of the right lower side over the past 2 days.  She says it is associate with dysuria.  Patient is afebrile and overall well-appearing.  She does have generalized abdominal tenderness on exam.  Normal bowel sounds.  Does not appear to be in any distress.  Chest clear to auscultation heart regular rate rhythm.  Urinalysis shows negative leukocytes, nitrites, glucose, hemoglobin.  We will send urine for culture to assess for any bacterial growth and treat for UTI positive.  CBC and CMP obtained.  CBC shows normal WBC counts and slightly increased hemoglobin at 15.8.  Additionally she has elevated hemoglobin at 15.8.  CMP shows elevated glucose at 135, elevated BUN at 27 and elevated calcium at 10.6.  KUB performed and independently viewed by me.  KUB shows moderate stool burden with no evidence of nephrolithiasis.  Reviewed all results with patient and advised her none are significant or concerning.  I suspect she is a little bit dehydrated based on her BUN and urinalysis and she is also moderately constipated.  Advised increasing her rest and fluids and taking Tylenol  for pain  relief.  Advised a stool softener and considering Pepto-Bismol for upset stomach.  Advised her to follow-up with her PCP.  Patient informs me that she has an appointment with her PCP tomorrow morning.  We did review ED red flag signs and symptoms related to abdominal pain in case her symptoms were to worsen before her appointment.  Final Clinical Impressions(s) / UC Diagnoses   Final diagnoses:  Generalized abdominal pain  Dysuria     Discharge Instructions      -The urine test does not show any evidence of a UTI but I will send the urine for culture and if bacteria grows in a couple days we can send antibiotics.  You can take over-the-counter AZO if you develop more painful urination.  Increase your fluid intake.  Some of your labs indicate that you may be dehydrated. -Your white blood cell count is normal so do not have any concerns about significant infection. -Your blood sugar is up a tad but you were not fasting. -X-ray shows that you are a little bit constipated but there were no signs of kidney stones. -I suspect your abdominal pain could be related to the constipation.  I advised that you increase fluid intake and consider use of an over-the-counter stool softener such as MiraLAX.  You can take Tylenol and/or Pepto-Bismol for stomach ache -Keep appointment with your PCP tomorrow for reexamination. -However, if you develop a fever or worsening abdominal pain, vomiting, lethargy, please go to emergency department sooner.     ED Prescriptions   None    PDMP not reviewed this encounter.   Shirlee Latch, PA-C 10/24/20 1423

## 2020-10-24 NOTE — Discharge Instructions (Addendum)
-  The urine test does not show any evidence of a UTI but I will send the urine for culture and if bacteria grows in a couple days we can send antibiotics.  You can take over-the-counter AZO if you develop more painful urination.  Increase your fluid intake.  Some of your labs indicate that you may be dehydrated. -Your white blood cell count is normal so do not have any concerns about significant infection. -Your blood sugar is up a tad but you were not fasting. -X-ray shows that you are a little bit constipated but there were no signs of kidney stones. -I suspect your abdominal pain could be related to the constipation.  I advised that you increase fluid intake and consider use of an over-the-counter stool softener such as MiraLAX.  You can take Tylenol and/or Pepto-Bismol for stomach ache -Keep appointment with your PCP tomorrow for reexamination. -However, if you develop a fever or worsening abdominal pain, vomiting, lethargy, please go to emergency department sooner.

## 2020-10-25 ENCOUNTER — Ambulatory Visit: Admit: 2020-10-25 | Discharge: 2020-10-26 | Payer: MEDICARE

## 2020-10-25 DIAGNOSIS — E118 Type 2 diabetes mellitus with unspecified complications: Principal | ICD-10-CM

## 2020-10-25 DIAGNOSIS — K5900 Constipation, unspecified: Principal | ICD-10-CM

## 2020-10-25 DIAGNOSIS — I10 Essential (primary) hypertension: Principal | ICD-10-CM

## 2020-10-25 DIAGNOSIS — E785 Hyperlipidemia, unspecified: Principal | ICD-10-CM

## 2020-10-25 DIAGNOSIS — I2510 Atherosclerotic heart disease of native coronary artery without angina pectoris: Principal | ICD-10-CM

## 2020-10-25 DIAGNOSIS — N898 Other specified noninflammatory disorders of vagina: Principal | ICD-10-CM

## 2020-10-25 DIAGNOSIS — D72819 Decreased white blood cell count, unspecified: Principal | ICD-10-CM

## 2020-10-25 DIAGNOSIS — G250 Essential tremor: Principal | ICD-10-CM

## 2020-10-25 DIAGNOSIS — C884 Extranodal marginal zone B-cell lymphoma of mucosa-associated lymphoid tissue [MALT-lymphoma]: Principal | ICD-10-CM

## 2020-10-25 DIAGNOSIS — E669 Obesity, unspecified: Principal | ICD-10-CM

## 2020-10-25 DIAGNOSIS — M1990 Unspecified osteoarthritis, unspecified site: Principal | ICD-10-CM

## 2020-10-25 LAB — URINE CULTURE

## 2020-11-27 DIAGNOSIS — K8500 Idiopathic acute pancreatitis without necrosis or infection: Principal | ICD-10-CM

## 2020-11-27 DIAGNOSIS — G250 Essential tremor: Principal | ICD-10-CM

## 2020-11-27 DIAGNOSIS — G4700 Insomnia, unspecified: Principal | ICD-10-CM

## 2020-11-28 MED ORDER — METFORMIN 500 MG TABLET
ORAL_TABLET | Freq: Every day | ORAL | 3 refills | 90 days | Status: CP
Start: 2020-11-28 — End: 2021-11-28

## 2020-11-28 MED ORDER — LORAZEPAM 0.5 MG TABLET
ORAL_TABLET | 0 refills | 0 days | Status: CP
Start: 2020-11-28 — End: ?

## 2020-11-28 MED ORDER — MELOXICAM 7.5 MG TABLET
ORAL_TABLET | 0 refills | 0 days | Status: CP
Start: 2020-11-28 — End: ?

## 2020-11-28 MED ORDER — BLOOD GLUCOSE TEST STRIPS
0 refills | 0 days | Status: CP
Start: 2020-11-28 — End: ?

## 2020-12-27 DIAGNOSIS — G4700 Insomnia, unspecified: Principal | ICD-10-CM

## 2020-12-27 DIAGNOSIS — J302 Other seasonal allergic rhinitis: Principal | ICD-10-CM

## 2020-12-27 DIAGNOSIS — K8500 Idiopathic acute pancreatitis without necrosis or infection: Principal | ICD-10-CM

## 2020-12-27 DIAGNOSIS — N952 Postmenopausal atrophic vaginitis: Principal | ICD-10-CM

## 2020-12-27 DIAGNOSIS — J4520 Mild intermittent asthma, uncomplicated: Principal | ICD-10-CM

## 2020-12-27 DIAGNOSIS — G250 Essential tremor: Principal | ICD-10-CM

## 2020-12-27 DIAGNOSIS — I10 Essential (primary) hypertension: Principal | ICD-10-CM

## 2020-12-27 MED ORDER — BLOOD GLUCOSE TEST STRIPS
ORAL_STRIP | 3 refills | 0 days | Status: CP
Start: 2020-12-27 — End: ?

## 2020-12-27 MED ORDER — FLUTICASONE PROPIONATE 50 MCG/ACTUATION NASAL SPRAY,SUSPENSION
Freq: Every day | NASAL | 11 refills | 123 days | Status: CP
Start: 2020-12-27 — End: 2021-12-27

## 2020-12-27 MED ORDER — FUROSEMIDE 20 MG TABLET
ORAL_TABLET | 1 refills | 0 days | Status: CP
Start: 2020-12-27 — End: ?

## 2020-12-27 MED ORDER — LANCETS 28 GAUGE
0 refills | 0 days | Status: CP
Start: 2020-12-27 — End: ?

## 2020-12-27 MED ORDER — TRUE METRIX LEVEL 1 SOLUTION
0 refills | 0 days | Status: CP
Start: 2020-12-27 — End: 2021-12-27

## 2020-12-27 MED ORDER — ALBUTEROL SULFATE HFA 90 MCG/ACTUATION AEROSOL INHALER
Freq: Four times a day (QID) | RESPIRATORY_TRACT | 2 refills | 0 days | Status: CP | PRN
Start: 2020-12-27 — End: 2021-12-27

## 2020-12-27 MED ORDER — METOPROLOL SUCCINATE ER 50 MG TABLET,EXTENDED RELEASE 24 HR
ORAL_TABLET | 2 refills | 0 days | Status: CP
Start: 2020-12-27 — End: 2021-12-27

## 2020-12-27 MED ORDER — LISINOPRIL 30 MG TABLET
ORAL_TABLET | Freq: Every day | ORAL | 3 refills | 90 days | Status: CP
Start: 2020-12-27 — End: 2021-12-27

## 2020-12-27 MED ORDER — ESTRADIOL 0.01% (0.1 MG/GRAM) VAGINAL CREAM
3 refills | 0 days | Status: CP
Start: 2020-12-27 — End: 2021-12-27

## 2020-12-28 MED ORDER — MELOXICAM 7.5 MG TABLET
ORAL_TABLET | 0 refills | 0 days | Status: CP
Start: 2020-12-28 — End: ?

## 2020-12-28 MED ORDER — LORAZEPAM 0.5 MG TABLET
ORAL_TABLET | 0 refills | 0 days | Status: CP
Start: 2020-12-28 — End: ?

## 2020-12-29 ENCOUNTER — Ambulatory Visit: Admit: 2020-12-29 | Discharge: 2020-12-30 | Payer: MEDICARE

## 2020-12-29 MED ORDER — LISINOPRIL 30 MG TABLET
ORAL_TABLET | Freq: Every day | ORAL | 3 refills | 90.00000 days | Status: CP
Start: 2020-12-29 — End: 2021-12-29

## 2020-12-29 MED ORDER — SPIRONOLACTONE 25 MG TABLET
ORAL_TABLET | Freq: Every day | ORAL | 3 refills | 90 days | Status: CP
Start: 2020-12-29 — End: ?

## 2021-01-18 ENCOUNTER — Ambulatory Visit: Admit: 2021-01-18 | Discharge: 2021-01-19 | Payer: MEDICARE

## 2021-01-18 DIAGNOSIS — H608X1 Other otitis externa, right ear: Principal | ICD-10-CM

## 2021-01-18 DIAGNOSIS — R42 Dizziness and giddiness: Principal | ICD-10-CM

## 2021-01-18 MED ORDER — ACETIC ACID 2 % EAR SOLUTION
Freq: Three times a day (TID) | OTIC | 0 refills | 25 days | Status: CP
Start: 2021-01-18 — End: 2021-01-25

## 2021-01-18 MED ORDER — MECLIZINE 12.5 MG TABLET
ORAL_TABLET | Freq: Two times a day (BID) | ORAL | 0 refills | 10 days | Status: CP | PRN
Start: 2021-01-18 — End: ?

## 2021-01-24 DIAGNOSIS — K8500 Idiopathic acute pancreatitis without necrosis or infection: Principal | ICD-10-CM

## 2021-01-24 MED ORDER — BLOOD GLUCOSE TEST STRIPS
ORAL_STRIP | 1 refills | 0 days | Status: CP
Start: 2021-01-24 — End: 2022-01-24

## 2021-01-31 MED ORDER — LANCING DEVICE
2 refills | 0 days | Status: CP
Start: 2021-01-31 — End: 2022-01-31

## 2021-01-31 MED ORDER — ACCU-CHEK AVIVA PLUS TEST STRIPS
2 refills | 0 days | Status: CP
Start: 2021-01-31 — End: 2022-01-31

## 2021-02-10 MED ORDER — BLOOD-GLUCOSE METER KIT WRAPPER
0 refills | 0 days | Status: CP
Start: 2021-02-10 — End: 2022-02-10

## 2021-02-13 DIAGNOSIS — G4700 Insomnia, unspecified: Principal | ICD-10-CM

## 2021-02-13 DIAGNOSIS — G250 Essential tremor: Principal | ICD-10-CM

## 2021-02-13 MED ORDER — LORAZEPAM 0.5 MG TABLET
ORAL_TABLET | Freq: Two times a day (BID) | ORAL | 0 refills | 30 days | Status: CP | PRN
Start: 2021-02-13 — End: 2022-02-13

## 2021-02-14 ENCOUNTER — Ambulatory Visit
Admit: 2021-02-14 | Discharge: 2021-02-14 | Disposition: A | Discharge status: Home / Self Care | Payer: MEDICARE | Attending: Student in an Organized Health Care Education/Training Program

## 2021-02-14 ENCOUNTER — Emergency Department
Admit: 2021-02-14 | Discharge: 2021-02-14 | Disposition: A | Discharge status: Home / Self Care | Payer: MEDICARE | Attending: Student in an Organized Health Care Education/Training Program

## 2021-02-14 DIAGNOSIS — I5032 Chronic diastolic (congestive) heart failure: Principal | ICD-10-CM

## 2021-02-14 DIAGNOSIS — E8770 Fluid overload, unspecified: Principal | ICD-10-CM

## 2021-02-14 DIAGNOSIS — R0600 Dyspnea, unspecified: Principal | ICD-10-CM

## 2021-02-17 ENCOUNTER — Ambulatory Visit: Admit: 2021-02-17 | Discharge: 2021-02-18 | Payer: MEDICARE

## 2021-02-17 ENCOUNTER — Ambulatory Visit: Admit: 2021-02-17 | Discharge: 2021-02-18 | Payer: MEDICARE | Attending: Family | Primary: Family

## 2021-02-17 DIAGNOSIS — I5032 Chronic diastolic (congestive) heart failure: Principal | ICD-10-CM

## 2021-02-17 DIAGNOSIS — E861 Hypovolemia: Principal | ICD-10-CM

## 2021-02-17 DIAGNOSIS — Z09 Encounter for follow-up examination after completed treatment for conditions other than malignant neoplasm: Principal | ICD-10-CM

## 2021-02-21 ENCOUNTER — Ambulatory Visit: Admit: 2021-02-21 | Discharge: 2021-02-22 | Payer: MEDICARE | Attending: Adult Health | Primary: Adult Health

## 2021-02-21 DIAGNOSIS — I5032 Chronic diastolic (congestive) heart failure: Principal | ICD-10-CM

## 2021-02-21 DIAGNOSIS — R002 Palpitations: Principal | ICD-10-CM

## 2021-02-24 ENCOUNTER — Ambulatory Visit
Admission: EM | Admit: 2021-02-24 | Discharge: 2021-02-24 | Disposition: A | Discharge status: Home / Self Care | Payer: Medicare HMO

## 2021-02-24 ENCOUNTER — Other Ambulatory Visit: Payer: Self-pay

## 2021-02-24 ENCOUNTER — Ambulatory Visit (INDEPENDENT_AMBULATORY_CARE_PROVIDER_SITE_OTHER): Payer: Medicare HMO

## 2021-02-24 DIAGNOSIS — M25572 Pain in left ankle and joints of left foot: Secondary | ICD-10-CM | POA: Diagnosis not present

## 2021-02-24 DIAGNOSIS — M79672 Pain in left foot: Secondary | ICD-10-CM

## 2021-02-24 DIAGNOSIS — S93402A Sprain of unspecified ligament of left ankle, initial encounter: Secondary | ICD-10-CM

## 2021-02-24 NOTE — ED Triage Notes (Signed)
Patient is here for "Ankle Pain" (Left). Started "hurting" after "twisting it about a week ago". History of Arthiritis "anyway". Pain feels like a "spur" in bottom of foot. DOI; "date unknown".

## 2021-02-24 NOTE — Discharge Instructions (Signed)
Keep your ankle elevated is much as possible to help decrease swelling and aid in healing.  Apply moist heat to your ankle for 20 minutes at a time to help improve blood flow which will bring fresh oxygen and nutrients to the ligaments and help facilitate the removal of metabolic byproducts from inflammation.  Continue to take your Meloxicam as prescribed. You can take 2 tablets once daily for no more than 2-3 days.  Follow-up with Podiatry if your symptoms continue

## 2021-02-24 NOTE — ED Notes (Signed)
Placed on 2L Oxygen.

## 2021-02-24 NOTE — ED Provider Notes (Signed)
[facility]    CSN: [id number] Arrival date & time: 02/24/21  1010      History   Chief Complaint Chief Complaint  Patient presents with   Ankle Pain    Left     HPI Jacqueline Arroyo is a 81 y.o. female.   HPI  81 year old female here for evaluation of left ankle pain.  Patient ports that she is been having pain in her left ankle and the bottom of her foot for approximately a week.  She thinks she may have twisted it but she is not really sure.  She does have a history of arthritis and is prescribed meloxicam but does not like to take it because she is worried about her kidneys.  She states that she did notice some bruising in the back part of her heel but that has resolved.  Past Medical History:  Diagnosis Date   Diabetes mellitus without complication ([place])    Hypertension     There are no problems to display for this patient.   Past Surgical History:  Procedure Laterality Date   ABDOMINAL HYSTERECTOMY     BACK SURGERY     CHOLECYSTECTOMY     FOOT SURGERY      OB History   No obstetric history on file.      Home Medications    Prior to Admission medications   Medication Sig Start Date End Date Taking? Authorizing Provider  albuterol (VENTOLIN HFA) 108 (90 Base) MCG/ACT inhaler Inhale into the lungs. 07/12/19  Yes [provider]  amLODipine (NORVASC) 10 MG tablet Take 10 mg by mouth daily. 09/01/20  Yes [provider]  atorvastatin (LIPITOR) 10 MG tablet Take 1 tablet by mouth daily. 03/01/20  Yes [provider]  Blood Glucose Monitoring Suppl (FIFTY50 GLUCOSE METER 2.0) w/Device KIT See admin instructions. 02/10/21 02/10/22 Yes [provider]  cycloSPORINE (RESTASIS) 0.05 % ophthalmic emulsion INSTILL 1 DROP INTO EACH EYE TWICE DAILY 01/29/20  Yes [provider]  fenofibrate (TRICOR) 145 MG tablet Take by mouth. 06/27/20  Yes [provider]  fluticasone (FLONASE) 50 MCG/ACT nasal spray 1 spray  into each nostril daily. 12/27/20 12/27/21 Yes [provider]  furosemide (LASIX) 20 MG tablet Take by mouth. 07/23/19  Yes [provider]  glucose blood (ACCU-CHEK AVIVA PLUS) test strip USE TO CHECK BLOOD SUGAR TWICE DAILY 01/31/21 01/31/22 Yes [provider]  Lancets (FREESTYLE) lancets Check blood sugar twice daily 12/27/20  Yes [provider]  lisinopril (ZESTRIL) 30 MG tablet Take 1 tablet by mouth daily. 12/29/20 12/29/21 Yes [provider]  LORazepam (ATIVAN) 0.5 MG tablet Take by mouth. 07/31/19  Yes [provider]  meclizine (ANTIVERT) 12.5 MG tablet Take 12.5 mg by mouth 2 (two) times daily as needed. 01/18/21  Yes [provider]  meloxicam (MOBIC) 7.5 MG tablet Take 1 tablet by mouth daily. 01/26/20  Yes [provider]  metFORMIN (GLUCOPHAGE) 500 MG tablet Take 500 mg by mouth daily. 08/31/20  Yes [provider]  metoprolol succinate (TOPROL-XL) 50 MG 24 hr tablet Take 50 mg by mouth 2 (two) times daily. 09/08/19  Yes [provider]  spironolactone (ALDACTONE) 25 MG tablet Take 25 mg by mouth daily. 08/06/20  Yes [provider]  UNABLE TO FIND Med Name: HCTZ 10 mg   Yes [provider]  acetic acid 2 % otic solution SMARTSIG:Left Ear 01/19/21   [provider]  aspirin 81 MG EC tablet Take  by mouth. 07/17/19   [provider]  calcium-vitamin D (OSCAL WITH D) 500-200 MG-UNIT TABS tablet Take by mouth.    [provider]  Cyanocobalamin 2000 MCG TBCR Take by mouth.    [provider]  estradiol (ESTRACE) 0.1 MG/GM vaginal cream SMARTSIG:Gram(s) Vaginal 3 Times a Week 08/17/20   [provider]  fexofenadine (ALLEGRA) 180 MG tablet Take 1 tablet (180 mg total) by mouth daily. 09/12/20   Rodriguez-Southworth, Sandrea Matte  FLOVENT HFA 110 MCG/ACT inhaler SMARTSIG:2 Puff(s) By Mouth Twice Daily 05/09/20   [provider]  ketoconazole  (NIZORAL) 2 % cream Apply topically daily. 03/11/20   [provider]  neomycin-polymyxin-hydrocortisone (CORTISPORIN) 3.5-10000-1 OTIC suspension Place 3 drops into the left ear 3 (three) times daily. For 7 days 09/12/20   Rodriguez-Southworth, Sunday Spillers, PA-C  traMADol (ULTRAM) 50 MG tablet Take 1 tablet (50 mg total) by mouth every 6 (six) hours as needed. 09/12/20   Rodriguez-Southworth, Sunday Spillers, PA-C    Family History History reviewed. No pertinent family history.  Social History Social History   Tobacco Use   Smoking status: Never   Smokeless tobacco: Never  Vaping Use   Vaping Use: Never used  Substance Use Topics   Alcohol use: Never   Drug use: Never     Allergies   Ciprofloxacin, Keflex [cephalexin], Hydrocodone-acetaminophen, and Sulfamethoxazole   Review of Systems Review of Systems  Musculoskeletal:  Positive for arthralgias and joint swelling.  Skin:  Positive for color change.  Neurological:  Negative for weakness and numbness.  Hematological: Negative.   Psychiatric/Behavioral: Negative.      Physical Exam Triage Vital Signs ED Triage Vitals  Enc Vitals Group     BP 02/24/21 1034 (!) 150/93     Pulse Rate 02/24/21 1034 98     Resp 02/24/21 1034 (!) 24     Temp 02/24/21 1034 97.7 F (36.5 C)     Temp Source 02/24/21 1034 Oral     SpO2 02/24/21 1034 93 %     Weight 02/24/21 1026 179 lb 14.3 oz (81.6 kg)     Height --      Head Circumference --      Peak Flow --      Pain Score 02/24/21 1026 9     Pain Loc --      Pain Edu? --      Excl. in [place]? --    No data found.  Updated Vital Signs BP (!) 150/93 (BP Location: Left Arm)    Pulse 98    Temp 97.7 F (36.5 C) (Oral)    Resp (!) 24    Wt 179 lb 14.3 oz (81.6 kg)    SpO2 93%    BMI 35.13 kg/m   Visual Acuity Right Eye Distance:   Left Eye Distance:   Bilateral Distance:    Right Eye Near:   Left Eye Near:    Bilateral Near:     Physical Exam Vitals and nursing note reviewed.   Constitutional:      Appearance: Normal appearance. She is not ill-appearing.  HENT:     Head: Normocephalic and atraumatic.  Musculoskeletal:        General: Tenderness present. No swelling or deformity. Normal range of motion.  Skin:    General: Skin is warm and dry.     Capillary Refill: Capillary refill takes less than 2 seconds.     Findings: No bruising or erythema.  Neurological:     General:  No focal deficit present.     Mental Status: She is alert and oriented to person, place, and time.     Sensory: No sensory deficit.     Motor: No weakness.  Psychiatric:        Mood and Affect: Mood normal.        Behavior: Behavior normal.        Thought Content: Thought content normal.        Judgment: Judgment normal.     UC Treatments / Results  Labs (all labs ordered are listed, but only abnormal results are displayed) Labs Reviewed - No data to display  EKG   Radiology DG Ankle Complete Left  Result Date: 02/24/2021 CLINICAL DATA:  Ankle pain twisting about the toe. EXAM: LEFT ANKLE COMPLETE - 3+ VIEW COMPARISON:  None. FINDINGS: There is no evidence of fracture, dislocation, or joint effusion. Degenerative changes of the tibiotalar and talonavicular joints. Small plantar calcaneal spur. Soft tissue swelling about the ankle. Osteopenia. Vascular calcifications. IMPRESSION: 1.  No acute fracture or dislocation. 2. Degenerative changes of the tibiotalar and talonavicular joints. Osteopenia. 3.  Soft tissue swelling about the ankle. Electronically Signed   By: Keane Police D.O.   On: 02/24/2021 11:03   DG Foot Complete Left  Result Date: 02/24/2021 CLINICAL DATA:  Left foot/ankle twisting injury 1 week ago with left foot and ankle pain. EXAM: LEFT FOOT - COMPLETE 3+ VIEW COMPARISON:  None. FINDINGS: Minimal degenerate change of the first MTP joint and throughout the interphalangeal joints. Mild degenerate change over the midfoot and hindfoot. Mild pes planus deformity. Small  inferior calcaneal spur. Minimal degenerate change over the tibiotalar joint. No acute fracture or dislocation. No significant soft tissue injury. IMPRESSION: 1. No acute findings. 2. Mild degenerative changes as described. Electronically Signed   By: Marin Olp M.D.   On: 02/24/2021 11:02    Procedures Procedures (including critical care time)  Medications Ordered in UC Medications - No data to display  Initial Impression / Assessment and Plan / UC Course  I have reviewed the triage vital signs and the nursing notes.  Pertinent labs & imaging results that were available during my care of the patient were reviewed by me and considered in my medical decision making (see chart for details).  Patient is very pleasant, nontoxic-appearing 81 year old female here for evaluation of left ankle pain as outlined in HPI above.  Patient believes that she twisted it a week ago and has been having continued pain in her ankle joint as well as in the arch of her foot near her heel.  She is concerned it might be her arthritis versus a heel spur.  She did notice some bruising near her heel but that is resolved.  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  She has seen a podiatrist in the past but it has been a number of years and was no one local.  On exam patient's foot is in normal anatomical alignment and she has a flat arch.  DP and PT pulses are 2+.  Skin is pink and dry.  It is also warm.  Patient is full range of motion of her phalanges as well as her ankle joint.  She does have tenderness with palpation of the medial lateral joint line but no crepitus.  No tenderness with palpation of the calcaneus, Achilles, midfoot, or phalanges.  She does have mild tenderness with palpation of her posterior arch near her calcaneus.  Radiographs of her left foot and ankle were obtained in triage.  Left foot & ankle films independently reviewed and evaluated by me.  Impression: Patient does have a  flat arch and there is some mild bone spurring to her plantar calcaneus.  No evidence of fracture or dislocation.  There is some soft tissue swelling.  Radiology overread is pending. Radiology impression is no acute findings.  Mild degenerative changes to the first MTP joint and throughout the interphalangeal joint.  Mild degenerative changes over the midfoot and hindfoot.  Mild pes planus deformity.  Small inferior calcaneal spur.  Suspect patient is having arthritis flare and I have encouraged her to take her meloxicam as prescribed.  She is also to keep her foot elevated is much as possible to minimize swelling and apply ice as needed.  If her symptoms do not improve in a week I have given her contact information for [facility] podiatry practice for her to follow-up with.   Final Clinical Impressions(s) / UC Diagnoses   Final diagnoses:  Sprain of left ankle, unspecified ligament, initial encounter     Discharge Instructions      Keep your ankle elevated is much as possible to help decrease swelling and aid in healing.  Apply moist heat to your ankle for 20 minutes at a time to help improve blood flow which will bring fresh oxygen and nutrients to the ligaments and help facilitate the removal of metabolic byproducts from inflammation.  Continue to take your Meloxicam as prescribed. You can take 2 tablets once daily for no more than 2-3 days.  Follow-up with Podiatry if your symptoms continue       ED Prescriptions   None    PDMP not reviewed this encounter.   Margarette Canada, NP 02/24/21 [phone]

## 2021-02-27 ENCOUNTER — Ambulatory Visit: Admit: 2021-02-27 | Discharge: 2021-02-28 | Payer: MEDICARE

## 2021-02-27 DIAGNOSIS — I5032 Chronic diastolic (congestive) heart failure: Principal | ICD-10-CM

## 2021-02-27 DIAGNOSIS — E669 Obesity, unspecified: Principal | ICD-10-CM

## 2021-02-27 DIAGNOSIS — M1990 Unspecified osteoarthritis, unspecified site: Principal | ICD-10-CM

## 2021-02-27 DIAGNOSIS — G250 Essential tremor: Principal | ICD-10-CM

## 2021-02-27 DIAGNOSIS — Z8579 Personal history of other malignant neoplasms of lymphoid, hematopoietic and related tissues: Principal | ICD-10-CM

## 2021-02-27 DIAGNOSIS — E118 Type 2 diabetes mellitus with unspecified complications: Principal | ICD-10-CM

## 2021-02-27 DIAGNOSIS — R42 Dizziness and giddiness: Principal | ICD-10-CM

## 2021-02-27 DIAGNOSIS — K5900 Constipation, unspecified: Principal | ICD-10-CM

## 2021-02-27 DIAGNOSIS — C4499 Other specified malignant neoplasm of skin, unspecified: Principal | ICD-10-CM

## 2021-02-27 DIAGNOSIS — C884 Extranodal marginal zone B-cell lymphoma of mucosa-associated lymphoid tissue [MALT-lymphoma]: Principal | ICD-10-CM

## 2021-02-27 DIAGNOSIS — D72819 Decreased white blood cell count, unspecified: Principal | ICD-10-CM

## 2021-02-27 DIAGNOSIS — I10 Essential (primary) hypertension: Principal | ICD-10-CM

## 2021-02-27 DIAGNOSIS — E785 Hyperlipidemia, unspecified: Principal | ICD-10-CM

## 2021-02-27 DIAGNOSIS — N898 Other specified noninflammatory disorders of vagina: Principal | ICD-10-CM

## 2021-02-27 DIAGNOSIS — I2510 Atherosclerotic heart disease of native coronary artery without angina pectoris: Principal | ICD-10-CM

## 2021-02-27 MED ORDER — BETAMETHASONE DIPROPIONATE 0.05 % LOTION
Freq: Two times a day (BID) | TOPICAL | 1 refills | 0 days | Status: CP
Start: 2021-02-27 — End: 2022-02-27

## 2021-02-27 MED ORDER — LANCETS
5 refills | 0 days | Status: CP
Start: 2021-02-27 — End: ?

## 2021-02-27 MED ORDER — ACCU-CHEK GUIDE TEST STRIPS
Freq: Once | 5 refills | 0 days | Status: CP
Start: 2021-02-27 — End: 2021-02-27

## 2021-03-20 DIAGNOSIS — G250 Essential tremor: Principal | ICD-10-CM

## 2021-03-20 DIAGNOSIS — G4700 Insomnia, unspecified: Principal | ICD-10-CM

## 2021-03-20 MED ORDER — LORAZEPAM 0.5 MG TABLET
ORAL_TABLET | 0 refills | 0 days | Status: CP
Start: 2021-03-20 — End: ?

## 2021-03-20 MED ORDER — ACCU-CHEK GUIDE TEST STRIPS
Freq: Once | 5 refills | 0 days | Status: CP
Start: 2021-03-20 — End: 2021-03-20

## 2021-04-06 DIAGNOSIS — I10 Essential (primary) hypertension: Principal | ICD-10-CM

## 2021-04-07 MED ORDER — FUROSEMIDE 20 MG TABLET
ORAL_TABLET | ORAL | 1 refills | 84 days | Status: CP
Start: 2021-04-07 — End: 2022-04-07

## 2021-04-12 ENCOUNTER — Ambulatory Visit: Admit: 2021-04-12 | Discharge: 2021-04-12 | Payer: MEDICARE | Attending: Adult Health | Primary: Adult Health

## 2021-04-12 ENCOUNTER — Ambulatory Visit: Admit: 2021-04-12 | Discharge: 2021-04-12 | Payer: MEDICARE

## 2021-04-13 MED ORDER — MELOXICAM 7.5 MG TABLET
ORAL_TABLET | 0 refills | 0 days | Status: CP
Start: 2021-04-13 — End: ?

## 2021-04-18 DIAGNOSIS — C884 Extranodal marginal zone B-cell lymphoma of mucosa-associated lymphoid tissue [MALT-lymphoma]: Principal | ICD-10-CM

## 2021-04-19 ENCOUNTER — Other Ambulatory Visit: Admit: 2021-04-19 | Discharge: 2021-04-20 | Payer: MEDICARE

## 2021-04-19 ENCOUNTER — Ambulatory Visit: Admit: 2021-04-19 | Discharge: 2021-04-20 | Payer: MEDICARE

## 2021-04-19 DIAGNOSIS — C884 Extranodal marginal zone B-cell lymphoma of mucosa-associated lymphoid tissue [MALT-lymphoma]: Principal | ICD-10-CM

## 2021-05-11 DIAGNOSIS — G250 Essential tremor: Principal | ICD-10-CM

## 2021-05-11 DIAGNOSIS — G4700 Insomnia, unspecified: Principal | ICD-10-CM

## 2021-05-11 MED ORDER — LORAZEPAM 0.5 MG TABLET
ORAL_TABLET | 0 refills | 0 days | Status: CP
Start: 2021-05-11 — End: ?

## 2021-05-22 ENCOUNTER — Ambulatory Visit: Admit: 2021-05-22 | Discharge: 2021-05-23 | Payer: MEDICARE | Attending: Adult Health | Primary: Adult Health

## 2021-05-22 MED ORDER — METOPROLOL SUCCINATE ER 50 MG TABLET,EXTENDED RELEASE 24 HR
ORAL_TABLET | Freq: Every evening | ORAL | 2 refills | 90 days | Status: CP
Start: 2021-05-22 — End: ?

## 2021-05-23 MED ORDER — MELOXICAM 7.5 MG TABLET
ORAL_TABLET | Freq: Every day | ORAL | 1 refills | 30 days | Status: CP
Start: 2021-05-23 — End: 2022-05-23

## 2021-06-05 DIAGNOSIS — R931 Abnormal findings on diagnostic imaging of heart and coronary circulation: Principal | ICD-10-CM

## 2021-06-05 DIAGNOSIS — I5022 Chronic systolic (congestive) heart failure: Principal | ICD-10-CM

## 2021-06-13 DIAGNOSIS — G250 Essential tremor: Principal | ICD-10-CM

## 2021-06-13 DIAGNOSIS — G4700 Insomnia, unspecified: Principal | ICD-10-CM

## 2021-06-13 MED ORDER — LORAZEPAM 0.5 MG TABLET
ORAL_TABLET | 0 refills | 0 days | Status: CP
Start: 2021-06-13 — End: ?

## 2021-06-21 DIAGNOSIS — I10 Essential (primary) hypertension: Principal | ICD-10-CM

## 2021-06-22 MED ORDER — AMLODIPINE 10 MG TABLET
ORAL_TABLET | 0 refills | 0 days | Status: CP
Start: 2021-06-22 — End: ?

## 2021-06-27 ENCOUNTER — Ambulatory Visit: Admit: 2021-06-27 | Discharge: 2021-06-28 | Payer: MEDICARE

## 2021-06-27 DIAGNOSIS — R42 Dizziness and giddiness: Principal | ICD-10-CM

## 2021-06-27 DIAGNOSIS — G250 Essential tremor: Principal | ICD-10-CM

## 2021-06-27 DIAGNOSIS — Z8579 Personal history of other malignant neoplasms of lymphoid, hematopoietic and related tissues: Principal | ICD-10-CM

## 2021-06-27 DIAGNOSIS — I2510 Atherosclerotic heart disease of native coronary artery without angina pectoris: Principal | ICD-10-CM

## 2021-06-27 DIAGNOSIS — N898 Other specified noninflammatory disorders of vagina: Principal | ICD-10-CM

## 2021-06-27 DIAGNOSIS — M1990 Unspecified osteoarthritis, unspecified site: Principal | ICD-10-CM

## 2021-06-27 DIAGNOSIS — E785 Hyperlipidemia, unspecified: Principal | ICD-10-CM

## 2021-06-27 DIAGNOSIS — I5032 Chronic diastolic (congestive) heart failure: Principal | ICD-10-CM

## 2021-06-27 DIAGNOSIS — E118 Type 2 diabetes mellitus with unspecified complications: Principal | ICD-10-CM

## 2021-06-27 DIAGNOSIS — I10 Essential (primary) hypertension: Principal | ICD-10-CM

## 2021-06-27 DIAGNOSIS — E669 Obesity, unspecified: Principal | ICD-10-CM

## 2021-06-27 DIAGNOSIS — C4499 Other specified malignant neoplasm of skin, unspecified: Principal | ICD-10-CM

## 2021-06-27 MED ORDER — METFORMIN 500 MG TABLET
ORAL_TABLET | Freq: Every day | ORAL | 3 refills | 90 days | Status: CP
Start: 2021-06-27 — End: 2022-06-27

## 2021-06-27 MED ORDER — LISINOPRIL 30 MG TABLET
ORAL_TABLET | Freq: Every day | ORAL | 3 refills | 90 days | Status: CP
Start: 2021-06-27 — End: 2022-06-27

## 2021-06-27 MED ORDER — FENOFIBRATE NANOCRYSTALLIZED 145 MG TABLET
ORAL_TABLET | Freq: Every day | ORAL | 3 refills | 90 days | Status: CP
Start: 2021-06-27 — End: ?

## 2021-06-27 MED ORDER — SPIRONOLACTONE 25 MG TABLET
ORAL_TABLET | Freq: Every day | ORAL | 3 refills | 90.00000 days | Status: CP
Start: 2021-06-27 — End: ?

## 2021-06-27 MED ORDER — ATORVASTATIN 10 MG TABLET
ORAL_TABLET | Freq: Every day | ORAL | 3 refills | 90.00000 days | Status: CP
Start: 2021-06-27 — End: ?

## 2021-07-17 DIAGNOSIS — I10 Essential (primary) hypertension: Principal | ICD-10-CM

## 2021-07-17 DIAGNOSIS — G250 Essential tremor: Principal | ICD-10-CM

## 2021-07-17 DIAGNOSIS — G4700 Insomnia, unspecified: Principal | ICD-10-CM

## 2021-07-17 MED ORDER — LORAZEPAM 0.5 MG TABLET
ORAL_TABLET | 0 refills | 0 days | Status: CP
Start: 2021-07-17 — End: ?

## 2021-07-17 MED ORDER — FUROSEMIDE 20 MG TABLET
ORAL_TABLET | 0 refills | 0 days | Status: CP
Start: 2021-07-17 — End: ?

## 2021-08-07 MED ORDER — MELOXICAM 7.5 MG TABLET
ORAL_TABLET | Freq: Every day | ORAL | 0 refills | 30 days | Status: CP
Start: 2021-08-07 — End: ?

## 2021-08-16 ENCOUNTER — Ambulatory Visit: Admit: 2021-08-16 | Discharge: 2021-08-16 | Payer: MEDICARE

## 2021-08-17 ENCOUNTER — Ambulatory Visit: Admit: 2021-08-17 | Discharge: 2021-08-18 | Payer: MEDICARE

## 2021-08-17 DIAGNOSIS — M1990 Unspecified osteoarthritis, unspecified site: Principal | ICD-10-CM

## 2021-08-17 DIAGNOSIS — I10 Essential (primary) hypertension: Principal | ICD-10-CM

## 2021-08-17 DIAGNOSIS — H9202 Otalgia, left ear: Principal | ICD-10-CM

## 2021-08-17 MED ORDER — TRAMADOL 50 MG TABLET
ORAL_TABLET | Freq: Four times a day (QID) | ORAL | 0 refills | 8.00000 days | Status: CP | PRN
Start: 2021-08-17 — End: ?

## 2021-08-23 ENCOUNTER — Ambulatory Visit
Admission: EM | Admit: 2021-08-23 | Discharge: 2021-08-23 | Disposition: A | Discharge status: Home / Self Care | Payer: Medicare HMO | Attending: Family Medicine | Admitting: Family Medicine

## 2021-08-23 DIAGNOSIS — H66002 Acute suppurative otitis media without spontaneous rupture of ear drum, left ear: Secondary | ICD-10-CM | POA: Diagnosis not present

## 2021-08-23 MED ORDER — AMOXICILLIN 875 MG PO TABS: 875.0000 mg | ORAL_TABLET | Freq: Two times a day (BID) | ORAL | 0 refills | Status: AC

## 2021-08-23 MED ORDER — FLUCONAZOLE 150 MG PO TABS: 150.0000 mg | ORAL_TABLET | ORAL | 0 refills | Status: AC

## 2021-08-23 NOTE — ED Triage Notes (Signed)
Pt c/o LT ear ache x1 week, throat sore on LT side, red, no fevers

## 2021-08-23 NOTE — Discharge Instructions (Signed)
Some of the pharmacy to pick up your prescriptions.  To prevent a yeast infection: Take Diflucan 1 tablet on day 7.  Take the second tablet on day 10 of your antibiotics.

## 2021-08-23 NOTE — ED Provider Notes (Signed)
[facility]    CSN: [id number] Arrival date & time: 08/23/21  1033      History   Chief Complaint Chief Complaint  Patient presents with   Otalgia   Sore Throat    HPI Jacqueline Arroyo is a 81 y.o. female.   HPI   Jacqueline Arroyo presents for left ear pain for the past week with associated sore throat.  Denies fever.  She continues to have left ear pain and started having pain in the left side of her throat.  Says when she looked back there as she thought she saw some white spots and it was red.  She went to her primary care doctor's office this week and they did not see anything wrong with her ear.  She does see an ear nose and throat doctor but has not in a while.  Reports not feeling that well.  She goes to [employer]ingo a couple times a week but notes that everyone there tries to " take care of themselves."  No known sick contacts.  She has not taken a COVID test.   Fever : no  Cough: no Sputum: no Nasal congestion : no  Rhinorrhea: no Myalgias: no Appetite: normal  Hydration: normal  Abdominal pain: no Nausea: no Vomiting: no Sleep disturbance: no Headache: no      Past Medical History:  Diagnosis Date   Diabetes mellitus without complication ([place])    Hypertension     There are no problems to display for this patient.   Past Surgical History:  Procedure Laterality Date   ABDOMINAL HYSTERECTOMY     BACK SURGERY     CHOLECYSTECTOMY     FOOT SURGERY      OB History   No obstetric history on file.      Home Medications    Prior to Admission medications   Medication Sig Start Date End Date Taking? Authorizing Provider  acetic acid 2 % otic solution SMARTSIG:Left Ear 01/19/21  Yes [provider]  albuterol (VENTOLIN HFA) 108 (90 Base) MCG/ACT inhaler Inhale into the lungs. 07/12/19  Yes [provider]  amLODipine (NORVASC) 10 MG tablet Take 10 mg by mouth daily. 09/01/20  Yes [provider]  amoxicillin (AMOXIL) 875 MG tablet  Take 1 tablet (875 mg total) by mouth 2 (two) times daily for 10 days. 08/23/21 09/02/21 Yes Valmai Vandenberghe, DO  aspirin 81 MG EC tablet Take by mouth. 07/17/19  Yes [provider]  atorvastatin (LIPITOR) 10 MG tablet Take 1 tablet by mouth daily. 03/01/20  Yes [provider]  Blood Glucose Monitoring Suppl (FIFTY50 GLUCOSE METER 2.0) w/Device KIT See admin instructions. 02/10/21 02/10/22 Yes [provider]  calcium-vitamin D (OSCAL WITH D) 500-200 MG-UNIT TABS tablet Take by mouth.   Yes [provider]  Cyanocobalamin 2000 MCG TBCR Take by mouth.   Yes [provider]  cycloSPORINE (RESTASIS) 0.05 % ophthalmic emulsion INSTILL 1 DROP INTO EACH EYE TWICE DAILY 01/29/20  Yes [provider]  estradiol (ESTRACE) 0.1 MG/GM vaginal cream SMARTSIG:Gram(s) Vaginal 3 Times a Week 08/17/20  Yes [provider]  fenofibrate (TRICOR) 145 MG tablet Take by mouth. 06/27/20  Yes [provider]  fexofenadine (ALLEGRA) 180 MG tablet Take 1 tablet (180 mg total) by mouth daily. 09/12/20  Yes Rodriguez-Southworth, Sandrea Matte  FLOVENT HFA 110 MCG/ACT inhaler SMARTSIG:2 Puff(s) By Mouth Twice Daily 05/09/20  Yes [provider]  fluconazole (DIFLUCAN) 150 MG tablet Take 1 tablet (150 mg total)  by mouth every 3 (three) days for 2 doses. 08/30/21 09/03/21 Yes Terree Gaultney, DO  fluticasone (FLONASE) 50 MCG/ACT nasal spray 1 spray into each nostril daily. 12/27/20 12/27/21 Yes [provider]  furosemide (LASIX) 20 MG tablet Take by mouth. 07/23/19  Yes [provider]  glucose blood (ACCU-CHEK AVIVA PLUS) test strip USE TO CHECK BLOOD SUGAR TWICE DAILY 01/31/21 01/31/22 Yes [provider]  ketoconazole (NIZORAL) 2 % cream Apply topically daily. 03/11/20  Yes [provider]  Lancets (FREESTYLE) lancets Check blood sugar twice daily 12/27/20  Yes [provider]  lisinopril (ZESTRIL) 30 MG tablet Take 1  tablet by mouth daily. 12/29/20 12/29/21 Yes [provider]  LORazepam (ATIVAN) 0.5 MG tablet Take by mouth. 07/31/19  Yes [provider]  meclizine (ANTIVERT) 12.5 MG tablet Take 12.5 mg by mouth 2 (two) times daily as needed. 01/18/21  Yes [provider]  meloxicam (MOBIC) 7.5 MG tablet Take 1 tablet by mouth daily. 01/26/20  Yes [provider]  metFORMIN (GLUCOPHAGE) 500 MG tablet Take 500 mg by mouth daily. 08/31/20  Yes [provider]  metoprolol succinate (TOPROL-XL) 50 MG 24 hr tablet Take 50 mg by mouth 2 (two) times daily. 09/08/19  Yes [provider]  neomycin-polymyxin-hydrocortisone (CORTISPORIN) 3.5-10000-1 OTIC suspension Place 3 drops into the left ear 3 (three) times daily. For 7 days 09/12/20  Yes Rodriguez-Southworth, Sandrea Matte  spironolactone (ALDACTONE) 25 MG tablet Take 25 mg by mouth daily. 08/06/20  Yes [provider]  traMADol (ULTRAM) 50 MG tablet Take 1 tablet (50 mg total) by mouth every 6 (six) hours as needed. 09/12/20  Yes Rodriguez-Southworth, Sunday Spillers, PA-C  UNABLE TO FIND Med Name: HCTZ 10 mg   Yes [provider]    Family History History reviewed. No pertinent family history.  Social History Social History   Tobacco Use   Smoking status: Never   Smokeless tobacco: Never  Vaping Use   Vaping Use: Never used  Substance Use Topics   Alcohol use: Never   Drug use: Never     Allergies   Ciprofloxacin, Keflex [cephalexin], Hydrocodone-acetaminophen, and Sulfamethoxazole   Review of Systems Review of Systems: :negative unless otherwise stated in HPI.      Physical Exam Triage Vital Signs ED Triage Vitals  Enc Vitals Group     BP 08/23/21 1108 (!) 147/65     Pulse Rate 08/23/21 1108 79     Resp --      Temp 08/23/21 1108 98 F (36.7 C)     Temp Source 08/23/21 1108 Oral     SpO2 08/23/21 1108 96 %     Weight 08/23/21 1105 173 lb (78.5 kg)     Height 08/23/21 1105 '5\' 2"'   (1.575 m)     Head Circumference --      Peak Flow --      Pain Score 08/23/21 1105 5     Pain Loc --      Pain Edu? --      Excl. in [place]? --    No data found.  Updated Vital Signs BP (!) 147/65 (BP Location: Left Arm)   Pulse 79   Temp 98 F (36.7 C) (Oral)   Ht '5\' 2"'  (1.575 m)   Wt 78.5 kg   SpO2 96%   BMI 31.64 kg/m   Visual Acuity Right Eye Distance:   Left Eye Distance:   Bilateral Distance:    Right Eye Near:   Left  Eye Near:    Bilateral Near:     Physical Exam GEN:     alert, non-toxic appearing female in no distress    HENT:  mucus membranes moist, oropharyngeal without lesions or exudate, no tonsillar hypertrophy, mild  erythema, no clear nasal discharge, left TM opaque and erythematous, right TM normal EYES:   no scleral injection NECK:  normal ROM, no lymphadenopathy RESP:  no increased work of breathing CVS:   regular rate Skin:   warm and dry    UC Treatments / Results  Labs (all labs ordered are listed, but only abnormal results are displayed) Labs Reviewed - No data to display  EKG   Radiology No results found.  Procedures Procedures (including critical care time)  Medications Ordered in UC Medications - No data to display  Initial Impression / Assessment and Plan / UC Course  I have reviewed the triage vital signs and the nursing notes.  Pertinent labs & imaging results that were available during my care of the patient were reviewed by me and considered in my medical decision making (see chart for details).      Acute Otitis media Patient is a 81 year old female with a week of left ear pain.  Exam concerning for acute otitis media.  Overall patient is well-appearing, well-hydrated and without respiratory distress. She is afebrile. Treat with amoxicillin for 10 days.  She has history of antibiotic associated yeast infections.  Treat prophylactically with Diflucan.  Tylenol/Motrin's as needed for fever or discomfort.    Discussed MDM,  treatment plan and plan for follow-up with patient/parent who agrees with plan.   Final Clinical Impressions(s) / UC Diagnoses   Final diagnoses:  Non-recurrent acute suppurative otitis media of left ear without spontaneous rupture of tympanic membrane     Discharge Instructions      Some of the pharmacy to pick up your prescriptions.  To prevent a yeast infection: Take Diflucan 1 tablet on day 7.  Take the second tablet on day 10 of your antibiotics.      ED Prescriptions     Medication Sig Dispense Auth. Provider   amoxicillin (AMOXIL) 875 MG tablet Take 1 tablet (875 mg total) by mouth 2 (two) times daily for 10 days. 20 tablet Mckenze Slone, DO   fluconazole (DIFLUCAN) 150 MG tablet Take 1 tablet (150 mg total) by mouth every 3 (three) days for 2 doses. 2 tablet Lyndee Hensen, DO      PDMP not reviewed this encounter.   Lyndee Hensen, DO 08/23/21 1434

## 2021-09-05 ENCOUNTER — Ambulatory Visit
Admission: EM | Admit: 2021-09-05 | Discharge: 2021-09-05 | Disposition: A | Discharge status: Home / Self Care | Payer: Medicare HMO | Attending: Emergency Medicine | Admitting: Emergency Medicine

## 2021-09-05 ENCOUNTER — Ambulatory Visit (INDEPENDENT_AMBULATORY_CARE_PROVIDER_SITE_OTHER): Payer: Medicare HMO

## 2021-09-05 DIAGNOSIS — H66002 Acute suppurative otitis media without spontaneous rupture of ear drum, left ear: Secondary | ICD-10-CM | POA: Diagnosis present

## 2021-09-05 DIAGNOSIS — R002 Palpitations: Secondary | ICD-10-CM

## 2021-09-05 LAB — CBC WITH DIFFERENTIAL/PLATELET
Abs Immature Granulocytes: 0.1 10*3/uL — ABNORMAL HIGH (ref 0.00–0.07)
Basophils Absolute: 0 10*3/uL (ref 0.0–0.1)
Basophils Relative: 1 %
Eosinophils Absolute: 0 10*3/uL (ref 0.0–0.5)
Eosinophils Relative: 1 %
HCT: 58.6 % — ABNORMAL HIGH (ref 36.0–46.0)
Hemoglobin: 18 g/dL — ABNORMAL HIGH (ref 12.0–15.0)
Immature Granulocytes: 1 %
Lymphocytes Relative: 18 %
Lymphs Abs: 1.1 10*3/uL (ref 0.7–4.0)
MCH: 27.6 pg (ref 26.0–34.0)
MCHC: 30.7 g/dL (ref 30.0–36.0)
MCV: 89.7 fL (ref 80.0–100.0)
Monocytes Absolute: 0.3 10*3/uL (ref 0.1–1.0)
Monocytes Relative: 5 %
Neutro Abs: 4.5 10*3/uL (ref 1.7–7.7)
Neutrophils Relative %: 74 %
Platelets: 275 10*3/uL (ref 150–400)
RBC: 6.53 MIL/uL — ABNORMAL HIGH (ref 3.87–5.11)
RDW: 16.4 % — ABNORMAL HIGH (ref 11.5–15.5)
WBC: 6 10*3/uL (ref 4.0–10.5)
nRBC: 0 % (ref 0.0–0.2)

## 2021-09-05 LAB — COMPREHENSIVE METABOLIC PANEL
ALT: 22 U/L (ref 0–44)
AST: 23 U/L (ref 15–41)
Albumin: 4.7 g/dL (ref 3.5–5.0)
Alkaline Phosphatase: 42 U/L (ref 38–126)
Anion gap: 8 (ref 5–15)
BUN: 26 mg/dL — ABNORMAL HIGH (ref 8–23)
CO2: 26 mmol/L (ref 22–32)
Calcium: 10.1 mg/dL (ref 8.9–10.3)
Chloride: 104 mmol/L (ref 98–111)
Creatinine, Ser: 0.97 mg/dL (ref 0.44–1.00)
GFR, Estimated: 58 mL/min — ABNORMAL LOW (ref >60)
Glucose, Bld: 169 mg/dL — ABNORMAL HIGH (ref 70–99)
Potassium: 4.7 mmol/L (ref 3.5–5.1)
Sodium: 138 mmol/L (ref 135–145)
Total Bilirubin: 0.8 mg/dL (ref 0.3–1.2)
Total Protein: 7.8 g/dL (ref 6.5–8.1)

## 2021-09-05 LAB — TROPONIN I (HIGH SENSITIVITY): Troponin I (High Sensitivity): 7 ng/L (ref <18)

## 2021-09-05 MED ORDER — DOXYCYCLINE HYCLATE 100 MG PO CAPS: 100.0000 mg | ORAL_CAPSULE | Freq: Two times a day (BID) | ORAL | 0 refills | Status: AC

## 2021-09-05 NOTE — Discharge Instructions (Addendum)
Your chest x-ray and lab results were largely unremarkable.  They do not demonstrate any evidence of heart damage.  Take the doxycycline twice daily for 7 days with food for treatment of your ear infection.  Take an over-the-counter probiotic 1 hour after each dose of antibiotic to prevent diarrhea.  Use over-the-counter Tylenol and ibuprofen as needed for pain or fever.  Place a hot water bottle, or heating pad, underneath your pillowcase at night to help dilate up your ear and aid in pain relief as well as resolution of the infection.  Return for reevaluation for any new or worsening symptoms.

## 2021-09-05 NOTE — ED Triage Notes (Signed)
Patient reports that she was here about 10 days ago for an ear infection and was given Amoxicillin. Patient reports that she took about 4 pills of it and it made her heart flutter.   Patient reports that she is still having left ear pain.

## 2021-09-05 NOTE — ED Provider Notes (Signed)
[facility]    CSN: [id number] Arrival date & time: 09/05/21  4196      History   Chief Complaint Chief Complaint  Patient presents with   Otalgia    Left    heart flutter    HPI Jacqueline Arroyo is a 81 y.o. female.   HPI  81 year old female here for evaluation of cardiac complaints.  Patient reports that she was diagnosed with an ear infection 10 days ago and started on amoxicillin.  After taking 4 doses of amoxicillin she developed fluttering in her heart so she stopped taking the amoxicillin.  She states that she is not having any current fluttering in her heart but she is continuing to have pain in her left ear.  She denies any fever, chest pain, shortness of breath, dizzy, or sweating.  She states that she had some fatigue yesterday but this improved after she ate something.  Of greater concern is that she has been having pain into her left neck that extends out to her left shoulder for the last 1 to 2 days.  Patient has a history of diabetes and hypertension.  Past Medical History:  Diagnosis Date   Diabetes mellitus without complication ([place])    Hypertension     There are no problems to display for this patient.   Past Surgical History:  Procedure Laterality Date   ABDOMINAL HYSTERECTOMY     BACK SURGERY     CHOLECYSTECTOMY     FOOT SURGERY      OB History   No obstetric history on file.      Home Medications    Prior to Admission medications   Medication Sig Start Date End Date Taking? Authorizing Provider  doxycycline (VIBRAMYCIN) 100 MG capsule Take 1 capsule (100 mg total) by mouth 2 (two) times daily for 7 days. 09/05/21 09/12/21 Yes Margarette Canada, NP  acetic acid 2 % otic solution SMARTSIG:Left Ear 01/19/21   [provider]  albuterol (VENTOLIN HFA) 108 (90 Base) MCG/ACT inhaler Inhale into the lungs. 07/12/19   [provider]  amLODipine (NORVASC) 10 MG tablet Take 10 mg by mouth daily. 09/01/20   [provider]   aspirin 81 MG EC tablet Take by mouth. 07/17/19   [provider]  atorvastatin (LIPITOR) 10 MG tablet Take 1 tablet by mouth daily. 03/01/20   [provider]  Blood Glucose Monitoring Suppl (FIFTY50 GLUCOSE METER 2.0) w/Device KIT See admin instructions. 02/10/21 02/10/22  [provider]  calcium-vitamin D (OSCAL WITH D) 500-200 MG-UNIT TABS tablet Take by mouth.    [provider]  Cyanocobalamin 2000 MCG TBCR Take by mouth.    [provider]  cycloSPORINE (RESTASIS) 0.05 % ophthalmic emulsion INSTILL 1 DROP INTO EACH EYE TWICE DAILY 01/29/20   [provider]  estradiol (ESTRACE) 0.1 MG/GM vaginal cream SMARTSIG:Gram(s) Vaginal 3 Times a Week 08/17/20   [provider]  fenofibrate (TRICOR) 145 MG tablet Take by mouth. 06/27/20   [provider]  fexofenadine (ALLEGRA) 180 MG tablet Take 1 tablet (180 mg total) by mouth daily. 09/12/20   Rodriguez-Southworth, Sandrea Matte  FLOVENT HFA 110 MCG/ACT inhaler SMARTSIG:2 Puff(s) By Mouth Twice Daily 05/09/20   [provider]  fluticasone (FLONASE) 50 MCG/ACT nasal spray 1 spray into each nostril daily. 12/27/20 12/27/21  [provider]  furosemide (LASIX) 20 MG tablet Take by mouth. 07/23/19   [provider]  glucose blood (ACCU-CHEK AVIVA PLUS) test strip USE TO CHECK  BLOOD SUGAR TWICE DAILY 01/31/21 01/31/22  [provider]  ketoconazole (NIZORAL) 2 % cream Apply topically daily. 03/11/20   [provider]  Lancets (FREESTYLE) lancets Check blood sugar twice daily 12/27/20   [provider]  lisinopril (ZESTRIL) 30 MG tablet Take 1 tablet by mouth daily. 12/29/20 12/29/21  [provider]  LORazepam (ATIVAN) 0.5 MG tablet Take by mouth. 07/31/19   [provider]  meclizine (ANTIVERT) 12.5 MG tablet Take 12.5 mg by mouth 2 (two) times daily as needed. 01/18/21   [provider]  meloxicam (MOBIC) 7.5 MG  tablet Take 1 tablet by mouth daily. 01/26/20   [provider]  metFORMIN (GLUCOPHAGE) 500 MG tablet Take 500 mg by mouth daily. 08/31/20   [provider]  metoprolol succinate (TOPROL-XL) 50 MG 24 hr tablet Take 50 mg by mouth 2 (two) times daily. 09/08/19   [provider]  neomycin-polymyxin-hydrocortisone (CORTISPORIN) 3.5-10000-1 OTIC suspension Place 3 drops into the left ear 3 (three) times daily. For 7 days 09/12/20   Rodriguez-Southworth, Sunday Spillers, PA-C  spironolactone (ALDACTONE) 25 MG tablet Take 25 mg by mouth daily. 08/06/20   [provider]  traMADol (ULTRAM) 50 MG tablet Take 1 tablet (50 mg total) by mouth every 6 (six) hours as needed. 09/12/20   Rodriguez-Southworth, Sunday Spillers, PA-C  UNABLE TO FIND Med Name: HCTZ 10 mg    [provider]    Family History History reviewed. No pertinent family history.  Social History Social History   Tobacco Use   Smoking status: Never   Smokeless tobacco: Never  Vaping Use   Vaping Use: Never used  Substance Use Topics   Alcohol use: Never   Drug use: Never     Allergies   Ciprofloxacin, Keflex [cephalexin], Hydrocodone-acetaminophen, and Sulfamethoxazole   Review of Systems Review of Systems  Constitutional:  Negative for diaphoresis.  HENT:  Positive for ear pain. Negative for congestion and rhinorrhea.   Respiratory:  Negative for cough and shortness of breath.   Cardiovascular:  Positive for palpitations. Negative for chest pain.  Musculoskeletal:  Positive for neck pain.       Pain in left arm and shoulder  Neurological:  Negative for dizziness.     Physical Exam Triage Vital Signs ED Triage Vitals  Enc Vitals Group     BP 09/05/21 1134 (!) 177/73     Pulse Rate 09/05/21 1134 71     Resp --      Temp 09/05/21 1134 97.6 F (36.4 C)     Temp src --      SpO2 09/05/21 1134 94 %     Weight 09/05/21 1129 173 lb 1 oz (78.5 kg)     Height 09/05/21 1129 '5\' 2"'  (1.575 m)     Head  Circumference --      Peak Flow --      Pain Score 09/05/21 1129 8     Pain Loc --      Pain Edu? --      Excl. in [place]? --    No data found.  Updated Vital Signs BP (!) 177/73 (BP Location: Right Arm)   Pulse 71   Temp 97.6 F (36.4 C)   Ht '5\' 2"'  (1.575 m)   Wt 173 lb 1 oz (78.5 kg)   SpO2 94%   BMI 31.65 kg/m   Visual Acuity Right Eye Distance:   Left Eye Distance:   Bilateral Distance:    Right Eye Near:  Left Eye Near:    Bilateral Near:     Physical Exam Vitals and nursing note reviewed.  Constitutional:      Appearance: Normal appearance. She is not ill-appearing.  HENT:     Head: Normocephalic and atraumatic.     Right Ear: Tympanic membrane, ear canal and external ear normal. There is no impacted cerumen.     Left Ear: Ear canal and external ear normal. There is no impacted cerumen.     Nose: Nose normal.  Eyes:     Extraocular Movements: Extraocular movements intact.     Pupils: Pupils are equal, round, and reactive to light.  Cardiovascular:     Rate and Rhythm: Normal rate and regular rhythm.     Pulses: Normal pulses.     Heart sounds: Normal heart sounds. No murmur heard.    No friction rub. No gallop.  Pulmonary:     Effort: Pulmonary effort is normal.     Breath sounds: Normal breath sounds. No wheezing, rhonchi or rales.  Musculoskeletal:     Cervical back: Normal range of motion and neck supple. No tenderness.  Lymphadenopathy:     Cervical: No cervical adenopathy.  Skin:    General: Skin is warm and dry.     Capillary Refill: Capillary refill takes less than 2 seconds.     Findings: No erythema or rash.  Neurological:     General: No focal deficit present.     Mental Status: She is alert and oriented to person, place, and time.  Psychiatric:        Mood and Affect: Mood normal.        Behavior: Behavior normal.        Thought Content: Thought content normal.        Judgment: Judgment normal.      UC Treatments / Results   Labs (all labs ordered are listed, but only abnormal results are displayed) Labs Reviewed  CBC WITH DIFFERENTIAL/PLATELET - Abnormal; Notable for the following components:      Result Value   RBC 6.53 (*)    Hemoglobin 18.0 (*)    RDW 16.4 (*)    Abs Immature Granulocytes 0.10 (*)    All other components within normal limits  COMPREHENSIVE METABOLIC PANEL - Abnormal; Notable for the following components:   Glucose, Bld 169 (*)    BUN 26 (*)    GFR, Estimated 58 (*)    All other components within normal limits  TROPONIN I (HIGH SENSITIVITY)    EKG Sinus rhythm with first-degree AV block and a ventricular rate of 60 bpm Peer interval 220 ms Cures duration 140 ms QT/QTc 414/442 ms Possible left atrial enlargement, left axis deviation, left bundle branch block.   Radiology DG Chest 2 View  Result Date: 09/05/2021 CLINICAL DATA:  Palpitations EXAM: CHEST - 2 VIEW COMPARISON:  None Available. FINDINGS: The heart size and mediastinal contours are within normal limits. No acute airspace opacity. 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 IMPRESSION: No acute abnormality of the lungs. Electronically Signed   By: Delanna Ahmadi M.D.   On: 09/05/2021 13:03    Procedures Procedures (including critical care time)  Medications Ordered in UC Medications - No data to display  Initial Impression / Assessment and Plan / UC Course  I have reviewed the triage vital signs and the nursing notes.  Pertinent labs & imaging results that were available during my care  of the patient were reviewed by me and considered in my medical decision making (see chart for details).   Patient is a nontoxic-appearing 81 year old female here for evaluation of left ear pain and heart fluttering.  She reports that she had heart fluttering following up taking 4 doses of amoxicillin so she stopped taking the amoxicillin but did not come in for  reevaluation of either her continued left ear pain or her heart fluttering.  She is followed by cardiology but states she did not call cardiology either.  She denies any heart fluttering at present and she also denies chest pain, shortness of breath, dizziness, or sweating.  She states that she does have pain on the left side of her neck and it radiates out to her left shoulder.  This been going on for last 1 to 2 days.  She did have an episode of fatigue yesterday but reports that it resolved after she ate.  Patient's EKG shows sinus rhythm with a first-degree AV block and left bundle branch block.  The left bundle branch block is not new it was present on a previous EKG from 07/20/2019.  At that time she did not have any first-degree AV block but she did have a long PR interval of 194 ms.  There is no appreciable ST elevation in the EKG is largely unchanged from previous.  Her physical exam reveals a mildly erythematous left tympanic membrane but no effusion.  The external auditory canal is clear.  Right TM is pearly gray in appearance with normal light reflex and clear external auditory canal.  Patient has no anterior cervical lymphadenopathy on exam.  Cardiopulmonary exam reveals S1-S2 heart sounds with regular rate and rhythm and lung sounds that are clear to auscultation in all fields.  Her left neck pain and shoulder pain are concerning for possible cardiac involvement.  I will check CBC, CMP, and troponin.  We will also obtain chest x-ray.  If the studies are negative I will discharge patient home on doxycycline twice daily for 7 days for treatment of otitis media as patient has an allergy to cephalosporins.  CMP shows mildly elevated BUN of 26 but the creatinine is normal at 0.97.  Electrolytes are unremarkable.  Transaminases are normal.  CBC shows an elevated RBC count and hemoglobin.  The hematocrit is pending.  RDW is elevated at 16.4.  Platelets are 275. When reviewing historical trends going back  to 07/20/2019 patient has been experiencing an increase in RBC, hemoglobin, and hematocrit.  Some of this may be related to lack of hydration.  High-sensitivity troponin is 7.  Chest x-ray independently reviewed and evaluated by me.  Impression: The lung spaces are well aerated.  There is no evidence of infiltrate or effusion.  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  Radiology overread is pending. Radiology impression states heart size and mediastinal contours are within normal limits.  No acute airspace opacity.  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  No acute abnormality of the lungs.  The etiology of the patient's heart flutter is unclear but it may be related to the amoxicillin.  I will discharge her home on doxycycline twice daily for 7 days for treatment of her residual ear infection and have her follow-up with her PCP and cardiology for continued symptoms.   Final Clinical Impressions(s) / UC Diagnoses   Final diagnoses:  Non-recurrent acute suppurative otitis media of left ear without spontaneous rupture of  tympanic membrane   Discharge Instructions   None    ED Prescriptions     Medication Sig Dispense Auth. Provider   doxycycline (VIBRAMYCIN) 100 MG capsule Take 1 capsule (100 mg total) by mouth 2 (two) times daily for 7 days. 14 capsule Margarette Canada, NP      PDMP not reviewed this encounter.   Margarette Canada, NP 09/05/21 1342

## 2021-09-12 DIAGNOSIS — G4700 Insomnia, unspecified: Principal | ICD-10-CM

## 2021-09-12 DIAGNOSIS — G250 Essential tremor: Principal | ICD-10-CM

## 2021-09-12 DIAGNOSIS — J4520 Mild intermittent asthma, uncomplicated: Principal | ICD-10-CM

## 2021-09-12 MED ORDER — ALBUTEROL SULFATE HFA 90 MCG/ACTUATION AEROSOL INHALER
Freq: Four times a day (QID) | RESPIRATORY_TRACT | 0 refills | 0 days | Status: CP | PRN
Start: 2021-09-12 — End: ?

## 2021-09-13 MED ORDER — LORAZEPAM 0.5 MG TABLET
ORAL_TABLET | 0 refills | 0 days | Status: CP
Start: 2021-09-13 — End: ?

## 2021-09-14 MED ORDER — MELOXICAM 7.5 MG TABLET
ORAL_TABLET | Freq: Every day | ORAL | 0 refills | 30 days | Status: CP
Start: 2021-09-14 — End: ?

## 2021-10-05 ENCOUNTER — Ambulatory Visit: Admit: 2021-10-05 | Discharge: 2021-10-06 | Payer: MEDICARE

## 2021-10-13 ENCOUNTER — Ambulatory Visit
Admission: EM | Admit: 2021-10-13 | Discharge: 2021-10-13 | Disposition: A | Discharge status: Home / Self Care | Payer: Medicare HMO | Attending: Emergency Medicine | Admitting: Emergency Medicine

## 2021-10-13 DIAGNOSIS — R21 Rash and other nonspecific skin eruption: Secondary | ICD-10-CM | POA: Diagnosis not present

## 2021-10-13 MED ORDER — PREDNISONE 20 MG PO TABS: 40.0000 mg | ORAL_TABLET | Freq: Every day | ORAL | 0 refills | Status: DC

## 2021-10-13 MED ORDER — MUPIROCIN 2 % EX OINT
1.0000 | TOPICAL_OINTMENT | Freq: Two times a day (BID) | CUTANEOUS | 0 refills | Status: DC
Start: 2021-10-13 — End: 2022-02-12

## 2021-10-13 NOTE — ED Triage Notes (Signed)
Patient reports that she as at [employer]ible study last night and something bite her on on the left side of her neck.   Patient reports swelling on the left side of her neck and it is warn to the touch.   Patient reports that the bite does itch.

## 2021-10-13 NOTE — ED Provider Notes (Signed)
[facility]    CSN: [id number] Arrival date & time: 10/13/21  0857      History   Chief Complaint Chief Complaint  Patient presents with   Insect Bite    HPI Jacqueline Arroyo is a 81 y.o. female.   Patient presents for evaluation after insect bite behind the left ear beginning 1 day ago. endorses that it was a flying bug but she is unsure of what kind.  Site is pruritic and she feels that it has increased in size.  Has not attempted treatment of symptoms.  History of diabetes and hypertension.  Denies drainage, fever or chills  Past Medical History:  Diagnosis Date   Diabetes mellitus without complication ([place])    Hypertension     There are no problems to display for this patient.   Past Surgical History:  Procedure Laterality Date   ABDOMINAL HYSTERECTOMY     BACK SURGERY     CHOLECYSTECTOMY     FOOT SURGERY      OB History   No obstetric history on file.      Home Medications    Prior to Admission medications   Medication Sig Start Date End Date Taking? Authorizing Provider  mupirocin ointment (BACTROBAN) 2 % Apply 1 Application topically 2 (two) times daily. 10/13/21  Yes Naresh Althaus R, NP  predniSONE (DELTASONE) 20 MG tablet Take 2 tablets (40 mg total) by mouth daily. 10/13/21  Yes Janna Oak, Vincente Liberty R, NP  acetic acid 2 % otic solution SMARTSIG:Left Ear 01/19/21   [provider]  albuterol (VENTOLIN HFA) 108 (90 Base) MCG/ACT inhaler Inhale into the lungs. 07/12/19   [provider]  amLODipine (NORVASC) 10 MG tablet Take 10 mg by mouth daily. 09/01/20   [provider]  aspirin 81 MG EC tablet Take by mouth. 07/17/19   [provider]  atorvastatin (LIPITOR) 10 MG tablet Take 1 tablet by mouth daily. 03/01/20   [provider]  Blood Glucose Monitoring Suppl (FIFTY50 GLUCOSE METER 2.0) w/Device KIT See admin instructions. 02/10/21 02/10/22  [provider]  calcium-vitamin D (OSCAL WITH D)  500-200 MG-UNIT TABS tablet Take by mouth.    [provider]  Cyanocobalamin 2000 MCG TBCR Take by mouth.    [provider]  cycloSPORINE (RESTASIS) 0.05 % ophthalmic emulsion INSTILL 1 DROP INTO EACH EYE TWICE DAILY 01/29/20   [provider]  estradiol (ESTRACE) 0.1 MG/GM vaginal cream SMARTSIG:Gram(s) Vaginal 3 Times a Week 08/17/20   [provider]  fenofibrate (TRICOR) 145 MG tablet Take by mouth. 06/27/20   [provider]  fexofenadine (ALLEGRA) 180 MG tablet Take 1 tablet (180 mg total) by mouth daily. 09/12/20   Rodriguez-Southworth, Sandrea Matte  FLOVENT HFA 110 MCG/ACT inhaler SMARTSIG:2 Puff(s) By Mouth Twice Daily 05/09/20   [provider]  fluticasone (FLONASE) 50 MCG/ACT nasal spray 1 spray into each nostril daily. 12/27/20 12/27/21  [provider]  furosemide (LASIX) 20 MG tablet Take by mouth. 07/23/19   [provider]  glucose blood (ACCU-CHEK AVIVA PLUS) test strip USE TO CHECK BLOOD SUGAR TWICE DAILY 01/31/21 01/31/22  [provider]  ketoconazole (NIZORAL) 2 % cream Apply topically daily. 03/11/20   [provider]  Lancets (FREESTYLE) lancets Check blood sugar twice daily 12/27/20   [provider]  lisinopril (ZESTRIL) 30 MG tablet Take 1 tablet by mouth daily. 12/29/20 12/29/21  [provider]  LORazepam (ATIVAN) 0.5 MG tablet Take by mouth. 07/31/19  [provider]  meclizine (ANTIVERT) 12.5 MG tablet Take 12.5 mg by mouth 2 (two) times daily as needed. 01/18/21   [provider]  meloxicam (MOBIC) 7.5 MG tablet Take 1 tablet by mouth daily. 01/26/20   [provider]  metFORMIN (GLUCOPHAGE) 500 MG tablet Take 500 mg by mouth daily. 08/31/20   [provider]  metoprolol succinate (TOPROL-XL) 50 MG 24 hr tablet Take 50 mg by mouth 2 (two) times daily. 09/08/19   [provider]  neomycin-polymyxin-hydrocortisone (CORTISPORIN)  3.5-10000-1 OTIC suspension Place 3 drops into the left ear 3 (three) times daily. For 7 days 09/12/20   Rodriguez-Southworth, Sunday Spillers, PA-C  spironolactone (ALDACTONE) 25 MG tablet Take 25 mg by mouth daily. 08/06/20   [provider]  traMADol (ULTRAM) 50 MG tablet Take 1 tablet (50 mg total) by mouth every 6 (six) hours as needed. 09/12/20   Rodriguez-Southworth, Sunday Spillers, PA-C  UNABLE TO FIND Med Name: HCTZ 10 mg    [provider]    Family History History reviewed. No pertinent family history.  Social History Social History   Tobacco Use   Smoking status: Never   Smokeless tobacco: Never  Vaping Use   Vaping Use: Never used  Substance Use Topics   Alcohol use: Never   Drug use: Never     Allergies   Amoxicillin, Ciprofloxacin, Keflex [cephalexin], Hydrocodone-acetaminophen, and Sulfamethoxazole   Review of Systems Review of Systems  Constitutional: Negative.   Respiratory: Negative.    Cardiovascular: Negative.   Skin:  Positive for rash. Negative for color change, pallor and wound.     Physical Exam Triage Vital Signs ED Triage Vitals  Enc Vitals Group     BP 10/13/21 0905 (!) 165/92     Pulse Rate 10/13/21 0905 80     Resp --      Temp 10/13/21 0905 97.9 F (36.6 C)     Temp Source 10/13/21 0905 Oral     SpO2 10/13/21 0905 94 %     Weight 10/13/21 0903 173 lb 1 oz (78.5 kg)     Height 10/13/21 0903 _0  (1.575 m)     Head Circumference --      Peak Flow --      Pain Score 10/13/21 0903 0     Pain Loc --      Pain Edu? --      Excl. in [place]? --    No data found.  Updated Vital Signs BP (!) 165/92 (BP Location: Left Arm)   Pulse 80   Temp 97.9 F (36.6 C) (Oral)   Ht _1  (1.575 m)   Wt 173 lb 1 oz (78.5 kg)   SpO2 94%   BMI 31.65 kg/m   Visual Acuity Right Eye Distance:   Left Eye Distance:   Bilateral Distance:    Right Eye Near:   Left Eye Near:    Bilateral Near:     Physical Exam Constitutional:      Appearance:  Normal appearance.  Eyes:     Extraocular Movements: Extraocular movements intact.  Pulmonary:     Effort: Pulmonary effort is normal.  Neurological:     Mental Status: She is alert and oriented to person, place, and time. Mental status is at baseline.  Psychiatric:        Mood and Affect: Mood normal.        Behavior: Behavior normal.       UC Treatments / Results  Labs (all  labs ordered are listed, but only abnormal results are displayed) Labs Reviewed - No data to display  EKG   Radiology No results found.  Procedures Procedures (including critical care time)  Medications Ordered in UC Medications - No data to display  Initial Impression / Assessment and Plan / UC Course  I have reviewed the triage vital signs and the nursing notes.  Pertinent labs & imaging results that were available during my care of the patient were reviewed by me and considered in my medical decision making (see chart for details).  Rash  Appears to be a localized reaction to possible insect bite, prednisone prescribed and prescribed topical Bactroban for coverage for bacteria, may use over-the-counter analgesics if pain occurs and topical or oral antihistamines or calamine lotion for management of pruritus, given strict precautions that if no improvement seen with medication usage to follow-up for reevaluation Final Clinical Impressions(s) / UC Diagnoses   Final diagnoses:  Rash and nonspecific skin eruption     Discharge Instructions      Today you are being treated for a bug bite reaction, we will provide coverage for the swelling and itching as well as for bacteria  Use of prednisone every morning with food for 5 days, this medicine helps to reduce inflammation that occurs with reaction which in turn will help to begin to clear your symptoms  You may use topical calamine lotion or Benadryl over the affected area to help manage itching  Apply Bactroban ointment every morning and  every evening for 7 days to help provide coverage for bacteria  If you have any concerns regarding healing please follow-up with urgent care or primary doctor for reevaluation   ED Prescriptions     Medication Sig Dispense Auth. Provider   predniSONE (DELTASONE) 20 MG tablet Take 2 tablets (40 mg total) by mouth daily. 10 tablet Lowella Petties R, NP   mupirocin ointment (BACTROBAN) 2 % Apply 1 Application topically 2 (two) times daily. 22 g Hans Eden, NP      PDMP not reviewed this encounter.   Hans Eden, [place] 10/13/21 [phone]

## 2021-10-13 NOTE — Discharge Instructions (Signed)
Today you are being treated for a bug bite reaction, we will provide coverage for the swelling and itching as well as for bacteria  Use of prednisone every morning with food for 5 days, this medicine helps to reduce inflammation that occurs with reaction which in turn will help to begin to clear your symptoms  You may use topical calamine lotion or Benadryl over the affected area to help manage itching  Apply Bactroban ointment every morning and every evening for 7 days to help provide coverage for bacteria  If you have any concerns regarding healing please follow-up with urgent care or primary doctor for reevaluation

## 2021-10-14 ENCOUNTER — Ambulatory Visit
Admit: 2021-10-14 | Discharge: 2021-10-14 | Disposition: A | Discharge status: Home / Self Care | Payer: MEDICARE | Attending: Emergency Medicine

## 2021-10-14 ENCOUNTER — Emergency Department
Admit: 2021-10-14 | Discharge: 2021-10-14 | Disposition: A | Discharge status: Home / Self Care | Payer: MEDICARE | Attending: Emergency Medicine

## 2021-10-14 DIAGNOSIS — J189 Pneumonia, unspecified organism: Principal | ICD-10-CM

## 2021-10-14 MED ORDER — AZITHROMYCIN 250 MG TABLET
ORAL_TABLET | Freq: Every day | ORAL | 0 refills | 3 days | Status: CP
Start: 2021-10-14 — End: 2021-10-17

## 2021-10-14 MED ORDER — CEFPODOXIME 200 MG TABLET
ORAL_TABLET | Freq: Two times a day (BID) | ORAL | 0 refills | 7 days | Status: CP
Start: 2021-10-14 — End: 2021-10-21

## 2021-10-14 MED ORDER — AZITHROMYCIN 500 MG TABLET
ORAL_TABLET | Freq: Every day | ORAL | 0 refills | 1 days | Status: CP
Start: 2021-10-14 — End: 2021-10-15

## 2021-10-16 ENCOUNTER — Ambulatory Visit
Admit: 2021-10-16 | Discharge: 2021-10-17 | Payer: MEDICARE | Attending: Student in an Organized Health Care Education/Training Program | Primary: Student in an Organized Health Care Education/Training Program

## 2021-10-16 DIAGNOSIS — R0600 Dyspnea, unspecified: Principal | ICD-10-CM

## 2021-10-16 DIAGNOSIS — E860 Dehydration: Principal | ICD-10-CM

## 2021-10-17 DIAGNOSIS — D751 Secondary polycythemia: Principal | ICD-10-CM

## 2021-10-19 ENCOUNTER — Ambulatory Visit: Admit: 2021-10-19 | Discharge: 2021-10-19 | Payer: MEDICARE

## 2021-10-19 ENCOUNTER — Other Ambulatory Visit: Admit: 2021-10-19 | Discharge: 2021-10-19 | Payer: MEDICARE

## 2021-10-19 DIAGNOSIS — R0600 Dyspnea, unspecified: Principal | ICD-10-CM

## 2021-10-19 DIAGNOSIS — D751 Secondary polycythemia: Principal | ICD-10-CM

## 2021-10-20 DIAGNOSIS — G250 Essential tremor: Principal | ICD-10-CM

## 2021-10-20 DIAGNOSIS — G4700 Insomnia, unspecified: Principal | ICD-10-CM

## 2021-10-20 MED ORDER — LORAZEPAM 0.5 MG TABLET
ORAL_TABLET | 0 refills | 0 days | Status: CP
Start: 2021-10-20 — End: ?

## 2021-10-20 MED ORDER — MELOXICAM 7.5 MG TABLET
ORAL_TABLET | Freq: Every day | ORAL | 0 refills | 30 days | Status: CP
Start: 2021-10-20 — End: ?

## 2021-10-23 ENCOUNTER — Encounter
Admit: 2021-10-23 | Discharge: 2021-10-23 | Payer: MEDICARE | Attending: Student in an Organized Health Care Education/Training Program | Primary: Student in an Organized Health Care Education/Training Program

## 2021-10-25 ENCOUNTER — Other Ambulatory Visit: Admit: 2021-10-25 | Discharge: 2021-10-26 | Payer: MEDICARE

## 2021-10-25 DIAGNOSIS — D751 Secondary polycythemia: Principal | ICD-10-CM

## 2021-10-25 DIAGNOSIS — C884 Extranodal marginal zone B-cell lymphoma of mucosa-associated lymphoid tissue [MALT-lymphoma]: Principal | ICD-10-CM

## 2021-10-31 ENCOUNTER — Ambulatory Visit: Admit: 2021-10-31 | Discharge: 2021-11-01 | Payer: MEDICARE

## 2021-10-31 DIAGNOSIS — J4520 Mild intermittent asthma, uncomplicated: Principal | ICD-10-CM

## 2021-10-31 DIAGNOSIS — M1990 Unspecified osteoarthritis, unspecified site: Principal | ICD-10-CM

## 2021-10-31 DIAGNOSIS — G250 Essential tremor: Principal | ICD-10-CM

## 2021-10-31 DIAGNOSIS — I10 Essential (primary) hypertension: Principal | ICD-10-CM

## 2021-10-31 DIAGNOSIS — D72819 Decreased white blood cell count, unspecified: Principal | ICD-10-CM

## 2021-10-31 DIAGNOSIS — E785 Hyperlipidemia, unspecified: Principal | ICD-10-CM

## 2021-10-31 DIAGNOSIS — C4499 Other specified malignant neoplasm of skin, unspecified: Principal | ICD-10-CM

## 2021-10-31 DIAGNOSIS — N898 Other specified noninflammatory disorders of vagina: Principal | ICD-10-CM

## 2021-10-31 DIAGNOSIS — E118 Type 2 diabetes mellitus with unspecified complications: Principal | ICD-10-CM

## 2021-10-31 DIAGNOSIS — Z8719 Personal history of other diseases of the digestive system: Principal | ICD-10-CM

## 2021-10-31 DIAGNOSIS — J302 Other seasonal allergic rhinitis: Principal | ICD-10-CM

## 2021-10-31 DIAGNOSIS — D751 Secondary polycythemia: Principal | ICD-10-CM

## 2021-10-31 DIAGNOSIS — Z8572 Personal history of non-Hodgkin lymphomas: Principal | ICD-10-CM

## 2021-10-31 DIAGNOSIS — I5022 Chronic systolic (congestive) heart failure: Principal | ICD-10-CM

## 2021-10-31 DIAGNOSIS — I2510 Atherosclerotic heart disease of native coronary artery without angina pectoris: Principal | ICD-10-CM

## 2021-10-31 DIAGNOSIS — Z8701 Personal history of pneumonia (recurrent): Principal | ICD-10-CM

## 2021-10-31 DIAGNOSIS — E669 Obesity, unspecified: Principal | ICD-10-CM

## 2021-10-31 MED ORDER — METFORMIN 500 MG TABLET
ORAL_TABLET | Freq: Every day | ORAL | 3 refills | 0.00000 days | Status: CP
Start: 2021-10-31 — End: 2021-10-31

## 2021-10-31 MED ORDER — MELOXICAM 7.5 MG TABLET
ORAL_TABLET | Freq: Every day | ORAL | 1 refills | 90.00000 days | Status: CP
Start: 2021-10-31 — End: ?

## 2021-10-31 MED ORDER — AMLODIPINE 10 MG TABLET
ORAL_TABLET | Freq: Every day | ORAL | 3 refills | 90.00000 days | Status: CP
Start: 2021-10-31 — End: ?

## 2021-10-31 MED ORDER — ALBUTEROL SULFATE HFA 90 MCG/ACTUATION AEROSOL INHALER
Freq: Four times a day (QID) | RESPIRATORY_TRACT | 0 refills | 0 days | Status: CP | PRN
Start: 2021-10-31 — End: ?

## 2021-10-31 MED ORDER — FLUTICASONE PROPIONATE 50 MCG/ACTUATION NASAL SPRAY,SUSPENSION
Freq: Every day | NASAL | 11 refills | 120 days | Status: CP
Start: 2021-10-31 — End: 2022-10-31

## 2021-11-06 DIAGNOSIS — C884 Extranodal marginal zone B-cell lymphoma of mucosa-associated lymphoid tissue [MALT-lymphoma]: Principal | ICD-10-CM

## 2021-11-08 ENCOUNTER — Ambulatory Visit: Admit: 2021-11-08 | Discharge: 2021-11-09 | Payer: MEDICARE

## 2021-11-08 ENCOUNTER — Encounter: Admit: 2021-11-08 | Discharge: 2021-11-09 | Payer: MEDICARE

## 2021-11-08 ENCOUNTER — Other Ambulatory Visit: Admit: 2021-11-08 | Discharge: 2021-11-09 | Payer: MEDICARE

## 2021-11-08 DIAGNOSIS — C884 Extranodal marginal zone B-cell lymphoma of mucosa-associated lymphoid tissue [MALT-lymphoma]: Principal | ICD-10-CM

## 2021-11-08 DIAGNOSIS — D751 Secondary polycythemia: Principal | ICD-10-CM

## 2021-12-08 ENCOUNTER — Ambulatory Visit
Admission: EM | Admit: 2021-12-08 | Discharge: 2021-12-08 | Disposition: A | Discharge status: Home / Self Care | Payer: Medicare HMO | Attending: Emergency Medicine | Admitting: Emergency Medicine

## 2021-12-08 ENCOUNTER — Encounter: Payer: Self-pay | Admitting: Emergency Medicine

## 2021-12-08 DIAGNOSIS — S20361A Insect bite (nonvenomous) of right front wall of thorax, initial encounter: Secondary | ICD-10-CM | POA: Diagnosis not present

## 2021-12-08 DIAGNOSIS — L03114 Cellulitis of left upper limb: Secondary | ICD-10-CM

## 2021-12-08 DIAGNOSIS — W57XXXA Bitten or stung by nonvenomous insect and other nonvenomous arthropods, initial encounter: Secondary | ICD-10-CM

## 2021-12-08 MED ORDER — DOXYCYCLINE HYCLATE 100 MG PO CAPS: 100.0000 mg | ORAL_CAPSULE | Freq: Two times a day (BID) | ORAL | 0 refills | Status: DC

## 2021-12-08 MED ORDER — PREDNISONE 10 MG PO TABS: 20.0000 mg | ORAL_TABLET | Freq: Every day | ORAL | 0 refills | Status: AC

## 2021-12-08 NOTE — ED Provider Notes (Signed)
[facility]    CSN: [id number] Arrival date & time: 12/08/21  1203      History   Chief Complaint Chief Complaint  Patient presents with   Insect Bite    Bed bugs   Rash    HPI Jacqueline Arroyo is a 81 y.o. female.   Presents for evaluation for bug bite to the left lower forearm and to the right chest wall beginning 1 day ago.  Endorses that she was sitting on a chair air bubble study when bite occurred, erythema, swelling and pruritus began upon return home.  Was notified by maintenance of exposure to bedbugs .  Has attempted use of a 20 mg dose of prednisone, topical Bactroban and without relief.  Denies fever, chills or drainage.    Past Medical History:  Diagnosis Date   Diabetes mellitus without complication ([place])    Hypertension     There are no problems to display for this patient.   Past Surgical History:  Procedure Laterality Date   ABDOMINAL HYSTERECTOMY     BACK SURGERY     CHOLECYSTECTOMY     FOOT SURGERY      OB History   No obstetric history on file.      Home Medications    Prior to Admission medications   Medication Sig Start Date End Date Taking? Authorizing Provider  doxycycline (VIBRAMYCIN) 100 MG capsule Take 1 capsule (100 mg total) by mouth 2 (two) times daily. 12/08/21  Yes Arial Galligan R, NP  predniSONE (DELTASONE) 10 MG tablet Take 2 tablets (20 mg total) by mouth daily for 5 days. 12/08/21 12/13/21 Yes Zelena Bushong R, NP  acetic acid 2 % otic solution SMARTSIG:Left Ear 01/19/21   [provider]  albuterol (VENTOLIN HFA) 108 (90 Base) MCG/ACT inhaler Inhale into the lungs. 07/12/19   [provider]  amLODipine (NORVASC) 10 MG tablet Take 10 mg by mouth daily. 09/01/20   [provider]  aspirin 81 MG EC tablet Take by mouth. 07/17/19   [provider]  atorvastatin (LIPITOR) 10 MG tablet Take 1 tablet by mouth daily. 03/01/20   [provider]  Blood Glucose Monitoring Suppl  (FIFTY50 GLUCOSE METER 2.0) w/Device KIT See admin instructions. 02/10/21 02/10/22  [provider]  calcium-vitamin D (OSCAL WITH D) 500-200 MG-UNIT TABS tablet Take by mouth.    [provider]  Cyanocobalamin 2000 MCG TBCR Take by mouth.    [provider]  cycloSPORINE (RESTASIS) 0.05 % ophthalmic emulsion INSTILL 1 DROP INTO EACH EYE TWICE DAILY 01/29/20   [provider]  estradiol (ESTRACE) 0.1 MG/GM vaginal cream SMARTSIG:Gram(s) Vaginal 3 Times a Week 08/17/20   [provider]  fenofibrate (TRICOR) 145 MG tablet Take by mouth. 06/27/20   [provider]  fexofenadine (ALLEGRA) 180 MG tablet Take 1 tablet (180 mg total) by mouth daily. 09/12/20   Rodriguez-Southworth, Sandrea Matte  FLOVENT HFA 110 MCG/ACT inhaler SMARTSIG:2 Puff(s) By Mouth Twice Daily 05/09/20   [provider]  fluticasone (FLONASE) 50 MCG/ACT nasal spray 1 spray into each nostril daily. 12/27/20 12/27/21  [provider]  furosemide (LASIX) 20 MG tablet Take by mouth. 07/23/19   [provider]  glucose blood (ACCU-CHEK AVIVA PLUS) test strip USE TO CHECK BLOOD SUGAR TWICE DAILY 01/31/21 01/31/22  [provider]  ketoconazole (NIZORAL) 2 % cream Apply topically daily. 03/11/20   [provider]  Lancets (FREESTYLE) lancets Check blood sugar twice daily 12/27/20  [provider]  lisinopril (ZESTRIL) 30 MG tablet Take 1 tablet by mouth daily. 12/29/20 12/29/21  [provider]  LORazepam (ATIVAN) 0.5 MG tablet Take by mouth. 07/31/19   [provider]  meclizine (ANTIVERT) 12.5 MG tablet Take 12.5 mg by mouth 2 (two) times daily as needed. 01/18/21   [provider]  meloxicam (MOBIC) 7.5 MG tablet Take 1 tablet by mouth daily. 01/26/20   [provider]  metFORMIN (GLUCOPHAGE) 500 MG tablet Take 500 mg by mouth daily. 08/31/20   [provider]  metoprolol succinate (TOPROL-XL) 50 MG  24 hr tablet Take 50 mg by mouth 2 (two) times daily. 09/08/19   [provider]  mupirocin ointment (BACTROBAN) 2 % Apply 1 Application topically 2 (two) times daily. 10/13/21   Shreshta Medley, Leitha Schuller, NP  neomycin-polymyxin-hydrocortisone (CORTISPORIN) 3.5-10000-1 OTIC suspension Place 3 drops into the left ear 3 (three) times daily. For 7 days 09/12/20   Rodriguez-Southworth, Sunday Spillers, PA-C  spironolactone (ALDACTONE) 25 MG tablet Take 25 mg by mouth daily. 08/06/20   [provider]  traMADol (ULTRAM) 50 MG tablet Take 1 tablet (50 mg total) by mouth every 6 (six) hours as needed. 09/12/20   Rodriguez-Southworth, Sunday Spillers, PA-C  UNABLE TO FIND Med Name: HCTZ 10 mg    [provider]    Family History History reviewed. No pertinent family history.  Social History Social History   Tobacco Use   Smoking status: Never   Smokeless tobacco: Never  Vaping Use   Vaping Use: Never used  Substance Use Topics   Alcohol use: Never   Drug use: Never     Allergies   Amoxicillin, Ciprofloxacin, Keflex [cephalexin], Hydrocodone-acetaminophen, and Sulfamethoxazole   Review of Systems Review of Systems   Physical Exam Triage Vital Signs ED Triage Vitals  Enc Vitals Group     BP 12/08/21 1320 (!) 161/81     Pulse Rate 12/08/21 1320 97     Resp 12/08/21 1320 15     Temp 12/08/21 1320 98.2 F (36.8 C)     Temp Source 12/08/21 1320 Oral     SpO2 12/08/21 1320 94 %     Weight 12/08/21 1317 172 lb (78 kg)     Height 12/08/21 1317 _0  (1.575 m)     Head Circumference --      Peak Flow --      Pain Score 12/08/21 1317 0     Pain Loc --      Pain Edu? --      Excl. in [place]? --    No data found.  Updated Vital Signs BP (!) 161/81 (BP Location: Right Arm)   Pulse 97   Temp 98.2 F (36.8 C) (Oral)   Resp 15   Ht _1  (1.575 m)   Wt 172 lb (78 kg)   SpO2 94%   BMI 31.46 kg/m   Visual Acuity Right Eye Distance:   Left Eye Distance:   Bilateral Distance:     Right Eye Near:   Left Eye Near:    Bilateral Near:     Physical Exam Constitutional:      Appearance: Normal appearance.  Eyes:     Extraocular Movements: Extraocular movements intact.  Pulmonary:     Effort: Pulmonary effort is normal.  Skin:    Comments: Skin of left forearm hot to touch, mild to severe swelling, site is erythematous, no drainage noted  Skin on the right chest wall is cool, erythema  and swelling present no drainage noted  Neurological:     Mental Status: She is alert and oriented to person, place, and time. Mental status is at baseline.  Psychiatric:        Mood and Affect: Mood normal.        Behavior: Behavior normal.         UC Treatments / Results  Labs (all labs ordered are listed, but only abnormal results are displayed) Labs Reviewed - No data to display  EKG   Radiology No results found.  Procedures Procedures (including critical care time)  Medications Ordered in UC Medications - No data to display  Initial Impression / Assessment and Plan / UC Course  I have reviewed the triage vital signs and the nursing notes.  Pertinent labs & imaging results that were available during my care of the patient were reviewed by me and considered in my medical decision making (see chart for details).  Cellulitis of left arm, bug bite, initial encounter, insect bite of chest,right , initial encounter  Concern for infection due to warmth and significant swelling of the left forearm, discussed with patient, doxycycline prescribed as well as low-dose prednisone for management of reaction to the right chest wall, discussed effect on blood sugar and advised to monitor closely, may use topical Benadryl cream for management of pruritus and recommended follow-up if symptoms persist or worsen at any point Final Clinical Impressions(s) / UC Diagnoses   Final diagnoses:  Cellulitis of left arm  Bug bite, initial encounter  Insect bite of chest, left,  initial encounter     Discharge Instructions      Today you are being treated for a bug bite, area to the left arm looks as if it has become infected most likely due to the broken skin when the bite occurred  Begin prednisone every morning for 5 days, this helps to reduce inflammation, will also help with itching, medicine will temporarily increase blood sugars  May continue use of Benadryl cream over affected areas to help with itching  Begin doxycycline every morning and every evening for 7 days to clear infection, if you begin to experience weakness of the legs please notify urgent care and stop medication  If your symptoms continue to persist past use of medication or worsen at any point please return for reevaluation   ED Prescriptions     Medication Sig Dispense Auth. Provider   doxycycline (VIBRAMYCIN) 100 MG capsule Take 1 capsule (100 mg total) by mouth 2 (two) times daily. 14 capsule Cathlin Buchan R, NP   predniSONE (DELTASONE) 10 MG tablet Take 2 tablets (20 mg total) by mouth daily for 5 days. 10 tablet Hans Eden, NP      PDMP not reviewed this encounter.   Hans Eden, NP 12/08/21 1416

## 2021-12-08 NOTE — Discharge Instructions (Addendum)
Today you are being treated for a bug bite, area to the left arm looks as if it has become infected most likely due to the broken skin when the bite occurred  Begin prednisone every morning for 5 days, this helps to reduce inflammation, will also help with itching, medicine will temporarily increase blood sugars  May continue use of Benadryl cream over affected areas to help with itching  Begin doxycycline every morning and every evening for 7 days to clear infection, if you begin to experience weakness of the legs please notify urgent care and stop medication  If your symptoms continue to persist past use of medication or worsen at any point please return for reevaluation

## 2021-12-08 NOTE — ED Triage Notes (Signed)
Patient states that she thinks that she got bit by bed bugs last night on her chest and left forearm.  Patient has itching, redness and swelling at the sites.  Patient took some Prednisone today.

## 2021-12-21 ENCOUNTER — Ambulatory Visit: Admit: 2021-12-21 | Discharge: 2021-12-22 | Payer: MEDICARE

## 2021-12-21 DIAGNOSIS — H6092 Unspecified otitis externa, left ear: Principal | ICD-10-CM

## 2021-12-21 DIAGNOSIS — E118 Type 2 diabetes mellitus with unspecified complications: Principal | ICD-10-CM

## 2021-12-21 MED ORDER — FLUOCINOLONE ACETONIDE OIL 0.01 % EAR DROPS
Freq: Two times a day (BID) | OTIC | 0 refills | 200 days | Status: CP
Start: 2021-12-21 — End: ?

## 2021-12-21 MED ORDER — LANCETS
5 refills | 0 days | Status: CP
Start: 2021-12-21 — End: ?

## 2022-01-03 DIAGNOSIS — G250 Essential tremor: Principal | ICD-10-CM

## 2022-01-03 DIAGNOSIS — G4700 Insomnia, unspecified: Principal | ICD-10-CM

## 2022-01-04 MED ORDER — LORAZEPAM 0.5 MG TABLET
ORAL_TABLET | 0 refills | 0 days | Status: CP
Start: 2022-01-04 — End: ?

## 2022-01-14 ENCOUNTER — Encounter: Payer: Self-pay | Admitting: Emergency Medicine

## 2022-01-14 ENCOUNTER — Ambulatory Visit
Admission: EM | Admit: 2022-01-14 | Discharge: 2022-01-14 | Disposition: A | Discharge status: Home / Self Care | Payer: Medicare HMO | Attending: Family Medicine | Admitting: Family Medicine

## 2022-01-14 DIAGNOSIS — G8929 Other chronic pain: Secondary | ICD-10-CM | POA: Insufficient documentation

## 2022-01-14 DIAGNOSIS — N898 Other specified noninflammatory disorders of vagina: Secondary | ICD-10-CM | POA: Insufficient documentation

## 2022-01-14 DIAGNOSIS — D72829 Elevated white blood cell count, unspecified: Secondary | ICD-10-CM | POA: Insufficient documentation

## 2022-01-14 LAB — WET PREP, GENITAL
Clue Cells Wet Prep HPF POC: NONE SEEN
Sperm: NONE SEEN
Trich, Wet Prep: NONE SEEN
WBC, Wet Prep HPF POC: <10 — AB (ref <10)
Yeast Wet Prep HPF POC: NONE SEEN

## 2022-01-14 LAB — URINALYSIS, ROUTINE W REFLEX MICROSCOPIC
Bilirubin Urine: NEGATIVE
Glucose, UA: NEGATIVE mg/dL
Ketones, ur: NEGATIVE mg/dL
Nitrite: NEGATIVE
Protein, ur: NEGATIVE mg/dL
Specific Gravity, Urine: 1.015 (ref 1.005–1.030)
pH: 5 (ref 5.0–8.0)

## 2022-01-14 LAB — URINALYSIS, MICROSCOPIC (REFLEX)

## 2022-01-14 MED ORDER — METHOCARBAMOL 500 MG PO TABS: 500.0000 mg | ORAL_TABLET | Freq: Two times a day (BID) | ORAL | 0 refills | Status: DC

## 2022-01-14 NOTE — ED Triage Notes (Signed)
Patient c/o neck pain, back pain and hip pain that 4-5 days ago.  Patient does have chronic pain.  Patient denies fall or injury.  Patient denies any urinary symptoms.  Patient also reports some vaginal itching.

## 2022-01-14 NOTE — Discharge Instructions (Addendum)
Your urine and vaginal swab did not show an infection. I sent your urine for culture.  Someone will contact you if you need to start antibiotics.   For your pain:  Take 2 tablets up to 3 times a day. Use Tramadol for severe pain. You can apply a Lidocaine patches or topical gels for additional pain. Take muscle relaxer at bedtime.

## 2022-01-14 NOTE — ED Provider Notes (Signed)
[facility]    CSN: [id number] Arrival date & time: 01/14/22  1033      History   Chief Complaint Chief Complaint  Patient presents with   Back Pain    HPI  HPI Jacqueline Arroyo is a 82 y.o. female.   [name] presents for pain in her hips, back that feels like stiffness for the past 4 days. Has shoulder pain.  Tramadol gives her a headache. Mobic isn't taking the pain away. It is somewhat better today.  Has soreness throughout her body.  Has to get a PET scan next Tuesday to see why her "bones are making extra blood".  Feels like she needs a muscle relaxer as this has helped her in the past.  Has no new urinary or bladder incontinence.  Has to use the vaginal cream every once in a while for vaginal irritations. Has burning when she urinates.  No fever, nausea, vomiting, belly pain.    She has diabetes but says that they do not let her check her blood sugar more than once a day.     Past Medical History:  Diagnosis Date   Diabetes mellitus without complication ([place])    Hypertension     There are no problems to display for this patient.   Past Surgical History:  Procedure Laterality Date   ABDOMINAL HYSTERECTOMY     BACK SURGERY     CHOLECYSTECTOMY     FOOT SURGERY      OB History   No obstetric history on file.      Home Medications    Prior to Admission medications   Medication Sig Start Date End Date Taking? Authorizing Provider  methocarbamol (ROBAXIN) 500 MG tablet Take 1 tablet (500 mg total) by mouth 2 (two) times daily. 01/14/22  Yes Sarahann Horrell, Ronnette Juniper, DO  acetic acid 2 % otic solution SMARTSIG:Left Ear 01/19/21   [provider]  albuterol (VENTOLIN HFA) 108 (90 Base) MCG/ACT inhaler Inhale into the lungs. 07/12/19   [provider]  amLODipine (NORVASC) 10 MG tablet Take 10 mg by mouth daily. 09/01/20   [provider]  aspirin 81 MG EC tablet Take by mouth. 07/17/19   [provider]  atorvastatin (LIPITOR) 10  MG tablet Take 1 tablet by mouth daily. 03/01/20   [provider]  Blood Glucose Monitoring Suppl (FIFTY50 GLUCOSE METER 2.0) w/Device KIT See admin instructions. 02/10/21 02/10/22  [provider]  calcium-vitamin D (OSCAL WITH D) 500-200 MG-UNIT TABS tablet Take by mouth.    [provider]  Cyanocobalamin 2000 MCG TBCR Take by mouth.    [provider]  cycloSPORINE (RESTASIS) 0.05 % ophthalmic emulsion INSTILL 1 DROP INTO EACH EYE TWICE DAILY 01/29/20   [provider]  doxycycline (VIBRAMYCIN) 100 MG capsule Take 1 capsule (100 mg total) by mouth 2 (two) times daily. 12/08/21   Hans Eden, NP  estradiol (ESTRACE) 0.1 MG/GM vaginal cream SMARTSIG:Gram(s) Vaginal 3 Times a Week 08/17/20   [provider]  fenofibrate (TRICOR) 145 MG tablet Take by mouth. 06/27/20   [provider]  fexofenadine (ALLEGRA) 180 MG tablet Take 1 tablet (180 mg total) by mouth daily. 09/12/20   Rodriguez-Southworth, Sandrea Matte  FLOVENT HFA 110 MCG/ACT inhaler SMARTSIG:2 Puff(s) By Mouth Twice Daily 05/09/20   [provider]  fluticasone (FLONASE) 50 MCG/ACT nasal spray 1 spray into each nostril daily. 12/27/20 12/27/21  [provider]  furosemide (LASIX) 20 MG tablet Take by mouth. 07/23/19  [provider]  glucose blood (ACCU-CHEK AVIVA PLUS) test strip USE TO CHECK BLOOD SUGAR TWICE DAILY 01/31/21 01/31/22  [provider]  ketoconazole (NIZORAL) 2 % cream Apply topically daily. 03/11/20   [provider]  Lancets (FREESTYLE) lancets Check blood sugar twice daily 12/27/20   [provider]  lisinopril (ZESTRIL) 30 MG tablet Take 1 tablet by mouth daily. 12/29/20 12/29/21  [provider]  LORazepam (ATIVAN) 0.5 MG tablet Take by mouth. 07/31/19   [provider]  meclizine (ANTIVERT) 12.5 MG tablet Take 12.5 mg by mouth 2 (two) times daily as needed. 01/18/21   [provider]   meloxicam (MOBIC) 7.5 MG tablet Take 1 tablet by mouth daily. 01/26/20   [provider]  metFORMIN (GLUCOPHAGE) 500 MG tablet Take 500 mg by mouth daily. 08/31/20   [provider]  metoprolol succinate (TOPROL-XL) 50 MG 24 hr tablet Take 50 mg by mouth 2 (two) times daily. 09/08/19   [provider]  mupirocin ointment (BACTROBAN) 2 % Apply 1 Application topically 2 (two) times daily. 10/13/21   White, Leitha Schuller, NP  neomycin-polymyxin-hydrocortisone (CORTISPORIN) 3.5-10000-1 OTIC suspension Place 3 drops into the left ear 3 (three) times daily. For 7 days 09/12/20   Rodriguez-Southworth, Sunday Spillers, PA-C  spironolactone (ALDACTONE) 25 MG tablet Take 25 mg by mouth daily. 08/06/20   [provider]  traMADol (ULTRAM) 50 MG tablet Take 1 tablet (50 mg total) by mouth every 6 (six) hours as needed. 09/12/20   Rodriguez-Southworth, Sunday Spillers, PA-C  UNABLE TO FIND Med Name: HCTZ 10 mg    [provider]    Family History History reviewed. No pertinent family history.  Social History Social History   Tobacco Use   Smoking status: Never   Smokeless tobacco: Never  Vaping Use   Vaping Use: Never used  Substance Use Topics   Alcohol use: Never   Drug use: Never     Allergies   Amoxicillin, Ciprofloxacin, Keflex [cephalexin], Hydrocodone-acetaminophen, and Sulfamethoxazole   Review of Systems Review of Systems: egative unless otherwise stated in HPI.      Physical Exam Triage Vital Signs ED Triage Vitals [01/14/22 1121]  Enc Vitals Group     BP      Pulse      Resp      Temp      Temp src      SpO2      Weight 171 lb 15.3 oz (78 kg)     Height 5\' 2"  (1.575 m)     Head Circumference      Peak Flow      Pain Score 8     Pain Loc      Pain Edu?      Excl. in [place]?    No data found.  Updated Vital Signs BP (!) 131/90 (BP Location: Left Arm)   Pulse 87   Temp 98.2 F (36.8 C) (Oral)   Resp 14   Ht 5\' 2"  (1.575 m)   Wt 78 kg   SpO2  99%   BMI 31.45 kg/m   Visual Acuity Right Eye Distance:   Left Eye Distance:   Bilateral Distance:    Right Eye Near:   Left Eye Near:    Bilateral Near:     Physical Exam GEN: chronically ill-appearing female with baseline tremor (not new)  CVS: well perfused, distal pulses intact   RESP: speaking in full sentences without pause, no respiratory distress  MSK: no  edema or overlying skin changes on examined skin  Lumbar spine: TTP paraspinal muscles with hypertonicity, gross sensation intact  Has walker in exam room  Neck: no midline spinous process tenderness or paraspinal hypertonicity Shoulder: ROM not assessed, no deltoid hypertonicity  GU: pt perfomed self-swab    UC Treatments / Results  Labs (all labs ordered are listed, but only abnormal results are displayed) Labs Reviewed  WET PREP, GENITAL - Abnormal; Notable for the following components:      Result Value   WBC, Wet Prep HPF POC <10 (*)    All other components within normal limits  URINE CULTURE - Abnormal; Notable for the following components:   Culture MULTIPLE SPECIES PRESENT, SUGGEST RECOLLECTION (*)    All other components within normal limits  URINALYSIS, ROUTINE W REFLEX MICROSCOPIC - Abnormal; Notable for the following components:   Hgb urine dipstick TRACE (*)    Leukocytes,Ua TRACE (*)    All other components within normal limits  URINALYSIS, MICROSCOPIC (REFLEX) - Abnormal; Notable for the following components:   Bacteria, UA FEW (*)    All other components within normal limits    EKG   Radiology No results found.  Procedures Procedures (including critical care time)  Medications Ordered in UC Medications - No data to display  Initial Impression / Assessment and Plan / UC Course  I have reviewed the triage vital signs and the nursing notes.  Pertinent labs & imaging results that were available during my care of the patient were reviewed by me and considered in my medical decision  making (see chart for details).      Pt is a 82 y.o.  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  She takes Mobic and Tramadol as needed.  Imaging deferred as she has no trauma or falls. Additionally, on exam there is no new midline tenderness. Her pain is reported to be fairly widespread. She has no infectious symptoms.  Does follow with hematology for polycythemia and is due for  PET scan soon. Per Dr Tressie Ellis note, pt has extranodal marginal B-cell lymphoma and polycythemia.  Her pain may be related.  Advised pt to keep her upcoming PET scan appointment.    Patient to gradually return to normal activities, as tolerated and continue ordinary activities within the limits permitted by pain. She was previously prescribed Mobic and Tramadol for pain.  Will add Robaxin BID PRN. Fall prevention dicussed.  Return and ED precautions given.   Regarding her vaginal irritation, wet prep is not concerning for acute bacterial or yeast vaginitis.  Urinalysis showed trace hematuria that was not supported on microscopy and leukocytosis however few bacteria were seen.  Urine culture sent and we will start antibiotics if needed.   Discussed MDM, treatment plan and plan for follow-up with patient who agrees with plan.   Final Clinical Impressions(s) / UC Diagnoses   Final diagnoses:  Other chronic pain  Vaginal irritation     Discharge Instructions      Your urine and vaginal swab did not show an infection. I sent your urine for culture.  Someone will contact you if you need to start antibiotics.   For your pain:  Take 2 tablets up to 3 times a day. Use Tramadol for severe pain. You can apply a Lidocaine patches or topical gels for additional pain. Take muscle relaxer at bedtime.      ED Prescriptions     Medication  Sig Dispense Auth. Provider   methocarbamol (ROBAXIN) 500 MG tablet Take 1 tablet (500 mg total) by mouth 2 (two) times daily.  30 tablet Katha Cabal, DO      PDMP not reviewed this encounter.   Katha Cabal, DO 01/17/22 1123

## 2022-01-15 LAB — URINE CULTURE

## 2022-02-01 MED ORDER — KETOCONAZOLE 2 % TOPICAL CREAM
0 refills | 0 days | Status: CP
Start: 2022-02-01 — End: ?

## 2022-02-05 MED ORDER — ACCU-CHEK GUIDE TEST STRIPS
Freq: Two times a day (BID) | 1 refills | 0 days | Status: CP
Start: 2022-02-05 — End: 2023-02-05

## 2022-02-05 MED ORDER — METOPROLOL SUCCINATE ER 50 MG TABLET,EXTENDED RELEASE 24 HR
ORAL_TABLET | Freq: Every evening | ORAL | 3 refills | 90 days | Status: CP
Start: 2022-02-05 — End: ?

## 2022-02-06 ENCOUNTER — Ambulatory Visit: Admit: 2022-02-06 | Discharge: 2022-02-06 | Payer: MEDICARE

## 2022-02-06 ENCOUNTER — Other Ambulatory Visit: Admit: 2022-02-06 | Discharge: 2022-02-06 | Payer: MEDICARE

## 2022-02-06 ENCOUNTER — Ambulatory Visit
Admit: 2022-02-06 | Discharge: 2022-02-06 | Payer: MEDICARE | Attending: Hematology & Oncology | Primary: Hematology & Oncology

## 2022-02-06 ENCOUNTER — Encounter
Admit: 2022-02-06 | Discharge: 2022-02-06 | Payer: MEDICARE | Attending: Hematology & Oncology | Primary: Hematology & Oncology

## 2022-02-06 DIAGNOSIS — D751 Secondary polycythemia: Principal | ICD-10-CM

## 2022-02-06 DIAGNOSIS — E611 Iron deficiency: Principal | ICD-10-CM

## 2022-02-06 DIAGNOSIS — D509 Iron deficiency anemia, unspecified: Principal | ICD-10-CM

## 2022-02-06 MED ORDER — HYDROXYUREA 500 MG CAPSULE
ORAL_CAPSULE | Freq: Every day | ORAL | 2 refills | 30 days | Status: CP
Start: 2022-02-06 — End: 2022-05-07
  Filled 2022-02-13: qty 30, 30d supply, fill #0

## 2022-02-10 NOTE — Unmapped (Signed)
[facility] Specialty Medication Onboarding    Specialty Medication: Hydroxyurea 500 mg capsules  Prior Authorization: Not Required   Financial Assistance: No - copay  <$25  Final Copay/Day Supply: $0 / 30    Insurance Restrictions:     Notes to Pharmacist:     The triage team has completed the benefits investigation and has determined that the patient is able to fill this medication at [facility]. Please contact the patient to complete the onboarding or follow up with the prescribing physician as needed.

## 2022-02-12 ENCOUNTER — Institutional Professional Consult (permissible substitution): Admit: 2022-02-12 | Discharge: 2022-02-13 | Payer: MEDICARE | Attending: Oncology | Primary: Oncology

## 2022-02-12 ENCOUNTER — Ambulatory Visit
Admission: EM | Admit: 2022-02-12 | Discharge: 2022-02-12 | Disposition: A | Discharge status: Home / Self Care | Payer: Medicare HMO | Attending: Physician Assistant | Admitting: Physician Assistant

## 2022-02-12 ENCOUNTER — Encounter: Payer: Self-pay | Admitting: Emergency Medicine

## 2022-02-12 DIAGNOSIS — L03115 Cellulitis of right lower limb: Secondary | ICD-10-CM | POA: Diagnosis not present

## 2022-02-12 DIAGNOSIS — R233 Spontaneous ecchymoses: Secondary | ICD-10-CM

## 2022-02-12 MED ORDER — TRIAMCINOLONE ACETONIDE 0.025 % EX OINT: 1.0000 | TOPICAL_OINTMENT | Freq: Two times a day (BID) | CUTANEOUS | 0 refills | Status: AC

## 2022-02-12 MED ORDER — CEFDINIR 300 MG PO CAPS: 300.0000 mg | ORAL_CAPSULE | Freq: Two times a day (BID) | ORAL | 0 refills | Status: AC

## 2022-02-12 NOTE — Unmapped (Unsigned)
**Incomplete onboarding**    LVM - 2/12    [facility]   Patient Onboarding/Medication Counseling    [name] is a 82 y.o. female with Polycythemia who I am counseling today on {Blank:19197::initiation,continuation} of therapy.  I am speaking to {Blank:19197::the patient,the patient's caregiver, ***,the patient's family member, ***,***}.    Was a Nurse, learning disability used for this call? No    Verified patient's date of birth / HIPAA.    Specialty medication(s) to be sent: Hematology/Oncology: Hydroxyurea      Non-specialty medications/supplies to be sent: n/a      Medications not needed at this time: n/a         Hydrea (hydroxyurea)     Medication & Administration     Dosage: Take 1 capsules (500mg ) by mouth once daily    Administration: Take by mouth once daily at about the same time each day. Take without regard to meals with a full glass of water. Swallow the capsule whole and do not chew, open, break, or crush the capsules.    Adherence/Missed dose instructions: Take a missed dose as soon as you remember it. If it is close to the time of your next dose, skip the missed dose and resume your normal schedule.    Goals of Therapy     Prevent disease progression    Side Effects & Monitoring Parameters   Upset stomach (nausea, vomiting)  Diarrhea/constipation  Fatigue   Loss of appetite  Headache  Mouth sores  Weight gain  Hair loss (not usually seen in HU doses)  Increased risk of infections  Increased risk of bleeding  Photosensitivity     The following side effects should be reported to the provider:  Signs of an allergic reaction  Skin changes  Shortness of breath, difficulty breathing, new or worsening cough  Heartbeat that doesn't feel normal (heart feels like it is racing, skipping a beat or fluttering)  Signs of infection  Excessive bruising, gums bleeding or nose bleeds  Skin changes  Yellowing of skin or eyes  Decrease in urination, change in color of urine, blood in urine or swelling in ankles  Tumor Lysis Syndrome symptoms (excessive nausea, vomiting, diarrhea or lethargic)      Contraindications, Warnings, & Precautions     Bone marrow suppression (CBC should be monitored at baseline and periodically throughout treatment)   Mouth sores-discussed use of baking soda/salt rinses  Exposure of an unborn child to this medication could cause birth defects so you should not become pregnant or father a [place] while on this medication.  Effective birth control is necessary during treatment and for 6 months after treatment for women and 1 year for men.  Radiation recall- when there is a skin reaction (looks like a severe sunburn) in the areas where radiation was previously given    Drug/Food Interactions     Medication list reviewed in Epic. The patient was instructed to inform the care team before taking any new medications or supplements. No drug interactions identified.   Avoid use of live vaccines during therapy.    Storage, Handling Precautions, & Disposal   Hydroxyurea should be stored at room temperature  Compound hydroxyurea is stable for 60 days at room temperature  Hydroxyurea is considered a hazardous agent. Anyone other than the patient should wear gloves if handling.  Wash hands thoroughly before and after handling      Current Medications (including OTC/herbals), Comorbidities and Allergies     Current Outpatient Medications  Medication Sig Dispense Refill    albuterol HFA 90 mcg/actuation inhaler Inhale 2 puffs every six (6) hours as needed for wheezing. 18 g 0    amlodipine (NORVASC) 10 MG tablet Take 1 tablet (10 mg total) by mouth daily. 90 tablet 3    aspirin (ECOTRIN) 81 MG tablet Take 1 tablet (81 mg total) by mouth daily. 30 tablet 11    atorvastatin (LIPITOR) 10 MG tablet Take 1 tablet (10 mg total) by mouth daily. 90 tablet 3    betamethasone dipropionate 0.05 % lotion Apply topically Two (2) times a day. 60 mL 1    blood sugar diagnostic (ACCU-CHEK GUIDE TEST STRIPS) Strp by Other route Two (2) times a day (30 minutes before a meal). 200 each 1    blood-glucose meter kit Use as instructed 1 each 0    calcium-vitamin D 500 mg(1,250mg ) -200 unit per tablet Take 1 tablet by mouth in the morning and 1 tablet in the evening. Take with meals.      cyanocobalamin 2000 MCG tablet Take 1 tablet (2,000 mcg total) by mouth daily. (Patient not taking: Reported on 10/31/2021)      estradioL (ESTRACE) 0.01 % (0.1 mg/gram) vaginal cream Insert 2 g into the vagina daily. Use externally prn      fenofibrate (TRICOR) 145 MG tablet Take 1 tablet (145 mg total) by mouth daily. 90 tablet 3    fluocinolone acetonide oil 0.01 % Drop Administer 1 drop into the left ear two (2) times a day. 20 mL 0    fluticasone propionate (FLONASE) 50 mcg/actuation nasal spray 1 spray into each nostril daily. 16 g 11    fluticasone propionate (FLONASE) 50 mcg/actuation nasal spray 1 spray into each nostril daily. 16 g 11    furosemide (LASIX) 20 MG tablet TAKE 1 TABLET BY MOUTH THREE TIMES A WEEK 36 tablet 0    guaiFENesin (MUCINEX) 600 mg 12 hr tablet Take 1 tablet (600 mg total) by mouth two (2) times a day as needed for congestion.  0    hydroxyurea (HYDREA) 500 mg capsule Take 1 capsule (500 mg total) by mouth daily. 30 capsule 2    ketoconazole (NIZORAL) 2 % cream APPLY  CREAM TOPICALLY ONCE DAILY FOR SCALP RASH 30 g 0    lancets (ACCU-CHEK SOFTCLIX LANCETS) Misc Use as directed to check glucose twice per day 200 each 5    lancing device Misc USE TO CHECK BLOOD SUGAR 2 TIMES A DAY 100 each 2    lisinopriL (PRINIVIL,ZESTRIL) 30 MG tablet Take 1 tablet (30 mg total) by mouth daily. 90 tablet 3    LORazepam (ATIVAN) 0.5 MG tablet TAKE 1/2 (ONE-HALF) TABLET BY MOUTH TWICE DAILY AS NEEDED FOR ANXIETY 30 tablet 0    meloxicam (MOBIC) 7.5 MG tablet Take 1 tablet (7.5 mg total) by mouth daily. 90 tablet 1    metFORMIN (GLUCOPHAGE) 500 MG tablet 1 tablet in the AM and 1/2 tab with dinner daily 180 tablet 3    metoPROLOL succinate (TOPROL-XL) 50 MG 24 hr tablet TAKE 1 TABLET BY MOUTH AT BEDTIME 90 tablet 3    mupirocin (BACTROBAN) 2 % ointment Apply 1 Application topically Two (2) times a day (at 8am and 12:00).      RESTASIS 0.05 % ophthalmic emulsion INSTILL 1 DROP INTO EACH EYE TWICE DAILY      spironolactone (ALDACTONE) 25 MG tablet Take 1 tablet (25 mg total) by mouth daily. 90 tablet 3    traMADoL (  ULTRAM) 50 mg tablet Take 1 tablet (50 mg total) by mouth every six (6) hours as needed for pain. (Patient not taking: Reported on 10/31/2021) 30 tablet 0     No current facility-administered medications for this visit.       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    Appropriateness of Therapy     Acute infections noted within Epic:  No active infections  Patient reported infection: {Blank single:19197::None,***- patient reported to provider,***- pharmacy reported to provider}    Is medication and dose appropriate based on diagnosis and infection status? Yes    Prescription has been clinically reviewed: Yes      Baseline Quality of Life Assessment      How many days over the past month did your condition  keep you from your normal activities? For example, brushing your teeth or getting up in the morning. {Blank:19197::***,0,Patient declined to answer}    Financial Information     Medication Assistance provided: None Required    Anticipated copay of $0 reviewed with patient. Verified delivery address.    Delivery Information     Scheduled delivery date: ***    Expected start date: ***    Patient was notified of new phone menu: Yes: Onc    Medication will be delivered via Next Day Courier to the prescription address in Epic [place].  This shipment will not require a signature.      Explained the services we provide at [facility] and that each month we would call to set up refills.  Stressed importance of returning phone calls so that we could ensure they receive their medications in time each month.  Informed patient that we should be setting up refills 7-10 days prior to when they will run out of medication.  A pharmacist will reach out to perform a clinical assessment periodically.  Informed patient that a welcome packet, containing information about our pharmacy and other support services, a Notice of  Privacy Practices, and a drug information handout will be sent.      The patient or caregiver noted above participated in the development of this care plan and knows that they can request review of or adjustments to the care plan at any time.      Patient or caregiver verbalized understanding of the above information as well as how to contact the pharmacy at [phone] option 4 with any questions/concerns.  The pharmacy is open Monday through Friday 8:30am-4:30pm.  A pharmacist is available 24/7 via pager to answer any clinical questions they may have.    Patient Specific Needs     Does the patient have any physical, cognitive, or cultural barriers? {Blank single:19197::No,Yes - ***}    Does the patient have adequate living arrangements? (i.e. the ability to store and take their medication appropriately) {Blank single:19197::Yes,No - ***}    Did you identify any home environmental safety or security hazards? {Blank single:19197::No,Yes - ***}    Patient prefers to have medications discussed with  Patient     Is the patient or caregiver able to read and understand education materials at a high school level or above? Yes    Patient's primary language is  English     Is the patient high risk? No    SOCIAL DETERMINANTS OF HEALTH     At the [facility], we have learned that life circumstances - like trouble affording food, housing, utilities, or transportation can affect the health of many of our patients.   That is why we wanted to ask: are you currently experiencing any life circumstances that are negatively impacting your health and/or quality of life? No    Social Determinants of Health     Financial Resource Strain: Low Risk  (02/14/2021)    Overall Financial Resource Strain (CARDIA)     Difficulty of Paying Living Expenses: Not hard at all   Internet Connectivity: No Internet connectivity concern identified (06/27/2021)    Internet Connectivity     Do you have access to internet services: Yes     How do you connect to the internet: Personal Device at home     Is your internet connection strong enough for you to watch video on your device without major problems?: Yes     Do you have enough data to get through the month?: Yes     Does at least one of the devices have a camera that you can use for video chat?: Yes   Food Insecurity: No Food Insecurity (02/14/2021)    Hunger Vital Sign     Worried About Running Out of Food in the Last Year: Never true     Ran Out of Food in the Last Year: Never true   Tobacco Use: Low Risk  (01/14/2022)    Received from [facility]    Patient History     Smoking Tobacco Use: Never     Smokeless Tobacco Use: Never     Passive Exposure: Not on file   Housing/Utilities: Low Risk  (02/14/2021)    Housing/Utilities     Within the past 12 months, have you ever stayed: outside, in a car, in a tent, in an overnight shelter, or temporarily in someone else's home (i.e. couch-surfing)?: No     Are you worried about losing your housing?: No     Within the past 12 months, have you been unable to get utilities (heat, electricity) when it was really needed?: No   Alcohol Use:  Not At Risk (06/27/2021)    Alcohol Use     How often do you have a drink containing alcohol?: Never     How many drinks containing alcohol do you have on a typical day when you are drinking?: 1 - 2     How often do you have 5 or more drinks on one occasion?: Never   Transportation Needs: No Transportation Needs (02/14/2021)    PRAPARE - Transportation     Lack of Transportation (Medical): No     Lack of Transportation (Non-Medical): No   Substance Use: Low Risk  (06/27/2021)    Substance Use     Taken prescription drugs for non-medical reasons: Never     Taken illegal drugs: Never     Patient indicated they have taken drugs in the past year for non-medical reasons: Yes, [positive answer(s)]: Not on file   Health Literacy: Low Risk  (10/15/2019)    Health Literacy     : Never   Physical Activity: Not on file   Interpersonal Safety: Not at risk (06/27/2021)    Interpersonal Safety     Unsafe Where You Currently Live: No     Physically Hurt by Anyone: No     Abused by Anyone: No   Stress: No Stress Concern Present (10/16/2021)    [employer] of Occupational Health - Occupational Stress Questionnaire     Feeling of Stress : Not at all   Intimate Partner Violence: Not At Risk (06/27/2021)    Humiliation, Afraid, Rape, and Kick questionnaire     Fear of Current or Ex-Partner: No     Emotionally Abused: No     Physically Abused: No     Sexually Abused: No   Depression: Not at risk (02/17/2021)    PHQ-2     PHQ-2 Score: 0   Social Connections: Not on file       Would you be willing to receive help with any of the needs that you have identified today? Not applicable       Kenslie Abbruzzese Vangie Bicker, PharmD  [facility] Specialty Pharmacist

## 2022-02-12 NOTE — Unmapped (Signed)
Regarding: TX: - Cindy Gonzalez - allergic reaction? insect bite  ----- Message from [place], CMA sent at 02/12/2022  9:53 AM EST -----  insect bite, redness on leg - hx of allergic reaction - no other sx's    appt made for 02/13/2022 at 10:20 - declined reginal, will go to urgent care if has to be seen today

## 2022-02-12 NOTE — Unmapped (Signed)
Copied from CRM #[id number]. Topic: HL - Post Triage Scheduling Support - See in   4 hours  >> Feb 12, 2022 10:19 AM Lorra Hals wrote:  Post-triage support request related to Appointment Scheduling  Primary Complaint/Symptom: redness 2 x 3 inches on leg, unsure if insect bite per pt   PASS Support Need: Cancel appointment

## 2022-02-12 NOTE — Unmapped (Addendum)
Clinical Pharmacist Practitioner: [facility]       Patient Information: Cindy Gonzalez  is a 82 y.o. year-old female with  polycythemia vera   who will start on hydroxyurea therapy. I called patient for hydroxyurea counseling.    A/P:   Ms.Kwong will start hydroxyurea 500 mg once daily, once received from pharmacy  Target: hematocrit < 45%, platelet within normal range, anc > 2.0    Counseled patient on:  -hydroxyurea therapeutic rationale, adherence/missed doses, dose and administration, lab monitoring/follow-up, adverse effects and management or safe handling, storage and disposal  -dose: take with/without food  -side effects: bone marrow suppression (risk of bleeding, infection), leg/mouth ulcers, nail discoloration, hair loss, nausea, diarrhea  -monitoring: cbc every 2 weeks for the first month, then every month for 2-3 months, then every 2-3 months if blood counts stable  -safe handling: hazardous medication, wash hands after contact with hydroxyurea capsules, keep away from children, pets    Ms.Cogburn verbalized understanding of the treatment plan provided and had no further questions.       F/u: 2/26 with Markus Jarvis, lab appointment for possible therapeutic phlebotomy on 2/23  ____________________________________________________________________________________________________________    Interim history:  Patient reported following:  -was in urgent care yesterday for bug bite on leg, better, not taking antibiotics prescribed  -keeps medications in a box in the living, no pillbox, but remembers to takes her medications  -currently has no pv-related symptoms  -lab draw at local PCP  -son drives her to appointments at [facility]    Medication Access  Insurance: Pecola Lawless      Labs (monitoring labs to be drawn at 02/06/22):   No visits with results within 1 Day(s) from this visit.   Latest known visit with results is:   Lab on 02/06/2022   Component Date Value Ref Range Status    LDH 02/06/2022 399 (H)  120 - 246 U/L Final    Reticulocyte Auto % 02/06/2022 1.42  0.50 - 2.17 % Final    Absolute Auto Reticulocyte 02/06/2022 100.4 (H)  23.0 - 100.0 10*9/L Final    Ferritin 02/06/2022 27.5  7.3 - 270.7 ng/mL Final    Iron 02/06/2022 51  50 - 170 ug/dL Final    TIBC 41/66/0630 392  250 - 425 ug/dL Final    Iron Saturation (%) 02/06/2022 13 (L)  20 - 55 % Final    Sodium 02/06/2022 139  135 - 145 mmol/L Final    Potassium 02/06/2022 5.3 (H)  3.5 - 5.1 mmol/L Final    Chloride 02/06/2022 106  98 - 107 mmol/L Final    CO2 02/06/2022 23.0  20.0 - 31.0 mmol/L Final    Anion Gap 02/06/2022 10  5 - 14 mmol/L Final    BUN 02/06/2022 25 (H)  9 - 23 mg/dL Final    Creatinine 16/01/930 0.92  0.55 - 1.02 mg/dL Final    BUN/Creatinine Ratio 02/06/2022 27   Final    eGFR CKD-EPI (2021) Female 02/06/2022 62  >=60 mL/min/1.12m2 Final    eGFR calculated with CKD-EPI 2021 equation in accordance with [employer] and [employer] of Nephrology Task Force recommendations.    Glucose 02/06/2022 116  70 - 179 mg/dL Final    Calcium 35/57/3220 10.0  8.7 - 10.4 mg/dL Final    Albumin 25/42/7062 4.1  3.4 - 5.0 g/dL Final    Total Protein 02/06/2022 7.2  5.7 - 8.2 g/dL Final    Total Bilirubin 02/06/2022 0.6  0.3 -  1.2 mg/dL Final    AST 16/10/9602 29  <=34 U/L Final    ALT 02/06/2022 23  10 - 49 U/L Final    Alkaline Phosphatase 02/06/2022 51  46 - 116 U/L Final    WBC 02/06/2022 7.8  3.6 - 11.2 10*9/L Final    RBC 02/06/2022 7.08 (H)  3.95 - 5.13 10*12/L Final    HGB 02/06/2022 19.5 (H)  11.3 - 14.9 g/dL Final    HCT 54/09/8117 59.7 (H)  34.0 - 44.0 % Final    MCV 02/06/2022 84.4  77.6 - 95.7 fL Final    MCH 02/06/2022 27.6  25.9 - 32.4 pg Final    MCHC 02/06/2022 32.7  32.0 - 36.0 g/dL Final    RDW 14/78/2956 16.3 (H)  12.2 - 15.2 % Final    MPV 02/06/2022 8.7  6.8 - 10.7 fL Final    Platelet 02/06/2022 262  150 - 450 10*9/L Final    Neutrophils % 02/06/2022 75.0  % Final    Lymphocytes % 02/06/2022 17.0  % Final Monocytes % 02/06/2022 6.2  % Final    Eosinophils % 02/06/2022 0.5  % Final    Basophils % 02/06/2022 1.3  % Final    Absolute Neutrophils 02/06/2022 5.9  1.8 - 7.8 10*9/L Final    Absolute Lymphocytes 02/06/2022 1.3  1.1 - 3.6 10*9/L Final    Absolute Monocytes 02/06/2022 0.5  0.3 - 0.8 10*9/L Final    Absolute Eosinophils 02/06/2022 0.0  0.0 - 0.5 10*9/L Final    Absolute Basophils 02/06/2022 0.1  0.0 - 0.1 10*9/L Final    Anisocytosis 02/06/2022 Slight (A)  Not Present Final    Smear Review Comments 02/06/2022 See Comment (A)  Undefined Final    Smear reviewed  Instrument ID [id number]  Reactive lymphocytes present    Giant Platelets 02/06/2022 Present (A)  Not Present Final       Approximate telephone time spent with patient: 30 minutes     Audie Box, PharmD, BCOP, CPP  Clinical Pharmacist Practitioner, Benign Hematology     The patient was physically located in [place] or a state in which I am permitted to provide care. The patient understood that s/he may incur co-pays and cost sharing, and agreed to the telemedicine visit. The visit was completed via phone and/or video, which was appropriate and reasonable under the circumstances given the patient's presentation at the time.     The patient has been advised of the potential risks and limitations of this mode of treatment (including, but not limited to, the absence of in-person examination) and has agreed to be treated using telemedicine. The patient's/patient's family's questions regarding telemedicine have been answered.      If the phone/video visit was completed in an ambulatory setting, the patient has also been advised to contact their provider???s office for worsening conditions, and seek emergency medical treatment and/or call 911 if the patient deems either necessary.     Visit conducted by: Telephone  Person contacted: patient  Contact phone number: [phone]   Is there someone else in the room? no

## 2022-02-12 NOTE — Unmapped (Signed)
Upcoming Appt:  Future Appointments   Date Time Provider Department Center   02/12/2022  1:00 PM Audie Box, CPP [id number]   02/13/2022 10:20 AM Mangel, [name], DO [facility]   02/14/2022  8:00 AM [facility] PET[facility] INJ RM 6 [facility] [place]   02/14/2022  9:30 AM [facility] PETCT RM 1 [facility] [place]   02/14/2022 10:15 AM ADULT ONC LAB [facility]   02/16/2022  2:00 PM Dittus, Providence Crosby, MD [id number]   02/23/2022 12:30 PM ADULT ONC LAB [facility]   02/23/2022  1:30 PM ONCDEV CHAIR 51 [id number]   02/26/2022  1:30 PM ADULT ONC LAB [facility]   02/26/2022  2:30 PM Vernie Murders, AGNP [facility]   03/06/2022  9:20 AM Jenell Milliner, MD [facility]   04/19/2022  8:30 AM ADULT ONC LAB [facility]   04/19/2022  9:30 AM Darel Hong, PA [id number]   05/16/2022 12:30 PM ADULT ONC LAB [facility]   05/16/2022  1:30 PM Dittus, Providence Crosby, MD [id number]       Recent:   What is the date of your last related visit?  No recent visit   Related acute medications Rx'd:  n/a  Home treatment tried:  doxycycline 100mg  Saturday-Monday , took one tablet each day , 2 Sunday, one today from previous bug bite before , triamcinolone cream, neosporin       Relevant:   Allergies: Amoxicillin, Cephalexin, Ciprofloxacin, Hydrocodone-acetaminophen, and Sulfamethoxazole  Medications: metformin, lisinopril , metoprolol   Health History: diabetes, HTN  Weight: n/a      [place] [facility] patients only:  What was the date of your last cancer treatment (mm/dd/yy)?: no  Was the treatment oral or infusion?: n./a  Are you currently on TVEC (yes/no)?: n/a  Reason for Disposition   [1] Looks infected (spreading redness, pus) AND [2] large red area (> 2 in. or 5 cm)    Answer Assessment - Initial Assessment Questions  1. APPEARANCE of RASH: Describe the rash.       Pinkish red patch, flat to skin, tiny pin point dot in center, not sure if a bug bite, she has not been outside, it is not open or draining -but has gotten a little larger since she first noticed it per pt , no swelling per pt   2. LOCATION: Where is the rash located?       It is on right leg front, just below the knee cap per pt   3. NUMBER: How many spots are there?       One spot per pt   4. SIZE: How big are the spots? (Inches, centimeters or compare to size of a coin)       2-3 inches on pinkish red per pt   5. ONSET: When did the rash start?       Started 02/08/22 evening per pt   6. ITCHING: Does the rash itch? If Yes, ask: How bad is the itch?  (Scale 0-10; or none, mild, moderate, severe)      It itched Thursday and Friday, not Saturday or Sunday, no itching today per pt   7. PAIN: Does the rash hurt? If Yes, ask: How bad is the pain?  (Scale 0-10; or none, mild, moderate, severe)     - NONE (0): no pain     - MILD (1-3): doesn't interfere with normal activities      -  MODERATE (4-7): interferes with normal activities or awakens from sleep      - SEVERE (8-10): excruciating pain, unable to do any normal activities     Currently 3-4/10 per pt    8. OTHER SYMPTOMS: Do you have any other symptoms? (e.g., fever)      No fever, no other symptoms, she is walking normally and not limping, she does have arthritis per pt , pt denies difficulty swallowing or difficulty breathing  9. PREGNANCY: Is there any chance you are pregnant? When was your last menstrual period?      N/a  Pt stated she used doxycycline 100mg  Saturday-Monday , took one tablet each day , 2 Sunday, one today from previous bug bite before and has no more, she also tried triamcinolone cream, neosporin .   Per [facility]: there are no pcp appointmentss today, she was scheduled for pcp appointment for tomorrow, 02/13/22 at 1020 and will cancel this appointment. Pt stated she will go to a local Urgent Care within the next 4 hours. This RN advised the pt to call back if worsens, concerns, new symptoms. Pt with verbal understanding and stated she has no other questions and ended the call. Sent message to scheduler to cancel the appointment.    Protocols used: Rash or Redness - Localized-A-AH

## 2022-02-12 NOTE — Discharge Instructions (Addendum)
-  The rash may be due to mild infection, but it also could be related to your underlying blood cell problems.  -I have sent an antibiotic to the pharmacy for you for possible infection as well as a topical corticosteroid. Also try to elevate and ice the area to help with swelling. -If you cannot tolerate the antibiotic let us know. If you have any swelling or breathing trouble call 911. -Follow up with PCP and hematologist

## 2022-02-12 NOTE — ED Triage Notes (Signed)
Pt has redness on right lower leg. Area was itchy but has gotten better Started about 4 days ago. She states it is a bug bite. She states she took 4 doxy that she had at home.

## 2022-02-12 NOTE — ED Provider Notes (Signed)
[facility]    CSN: [id number] Arrival date & time: 02/12/22  1051      History   Chief Complaint Chief Complaint  Patient presents with   Insect Bite    HPI Jacqueline Arroyo is a 82 y.o. female presenting for an area of redness and rash of the right anterior lower leg for the past 4 days.  Patient reports that she noticed an itchy bump and scratched vigorously.  Reports after that she developed increased redness.  States she has taken 4 doxycycline capsules over the past 2 days.  Reports similar redness seems to gotten better but now she thinks she is having an intolerance to the doxycycline.  Reports that it gave her palpitations.  Reports palpitations with amoxicillin, Keflex and Bactrim.  Patient denies fever.  She has not any pain in her leg.  No drainage from the leg.  Her medical history stemming for diabetes and hypertension.  Additionally patient has a history of polycythemia and is seeing a hematologist.  Next appointment is in 2 days.  HPI  Past Medical History:  Diagnosis Date   Diabetes mellitus without complication ([place])    Hypertension     There are no problems to display for this patient.   Past Surgical History:  Procedure Laterality Date   ABDOMINAL HYSTERECTOMY     BACK SURGERY     CHOLECYSTECTOMY     FOOT SURGERY      OB History   No obstetric history on file.      Home Medications    Prior to Admission medications   Medication Sig Start Date End Date Taking? Authorizing Provider  acetic acid 2 % otic solution SMARTSIG:Left Ear 01/19/21  Yes [provider]  albuterol (VENTOLIN HFA) 108 (90 Base) MCG/ACT inhaler Inhale into the lungs. 07/12/19  Yes [provider]  amLODipine (NORVASC) 10 MG tablet Take 10 mg by mouth daily. 09/01/20  Yes [provider]  aspirin 81 MG EC tablet Take by mouth. 07/17/19  Yes [provider]  atorvastatin (LIPITOR) 10 MG tablet Take 1 tablet by mouth daily. 03/01/20  Yes  [provider]  calcium-vitamin D (OSCAL WITH D) 500-200 MG-UNIT TABS tablet Take by mouth.   Yes [provider]  cefdinir (OMNICEF) 300 MG capsule Take 1 capsule (300 mg total) by mouth 2 (two) times daily for 7 days. 02/12/22 02/19/22 Yes Laurene Footman B, PA-C  cycloSPORINE (RESTASIS) 0.05 % ophthalmic emulsion INSTILL 1 DROP INTO EACH EYE TWICE DAILY 01/29/20  Yes [provider]  estradiol (ESTRACE) 0.1 MG/GM vaginal cream SMARTSIG:Gram(s) Vaginal 3 Times a Week 08/17/20  Yes [provider]  fenofibrate (TRICOR) 145 MG tablet Take by mouth. 06/27/20  Yes [provider]  FLOVENT HFA 110 MCG/ACT inhaler SMARTSIG:2 Puff(s) By Mouth Twice Daily 05/09/20  Yes [provider]  furosemide (LASIX) 20 MG tablet Take by mouth. 07/23/19  Yes [provider]  ketoconazole (NIZORAL) 2 % cream Apply topically daily. 03/11/20  Yes [provider]  Lancets (FREESTYLE) lancets Check blood sugar twice daily 12/27/20  Yes [provider]  LORazepam (ATIVAN) 0.5 MG tablet Take by mouth. 07/31/19  Yes [provider]  meclizine (ANTIVERT) 12.5 MG tablet Take 12.5 mg by mouth 2 (two) times daily as needed. 01/18/21  Yes [provider]  meloxicam (MOBIC) 7.5 MG tablet Take 1 tablet by mouth daily. 01/26/20  Yes [provider]  metFORMIN (GLUCOPHAGE) 500 MG tablet Take 500 mg by  mouth daily. 08/31/20  Yes [provider]  metoprolol succinate (TOPROL-XL) 50 MG 24 hr tablet Take 50 mg by mouth 2 (two) times daily. 09/08/19  Yes [provider]  spironolactone (ALDACTONE) 25 MG tablet Take 25 mg by mouth daily. 08/06/20  Yes [provider]  triamcinolone (KENALOG) 0.025 % ointment Apply 1 Application topically 2 (two) times daily for 7 days. 02/12/22 02/19/22 Yes Danton Clap, PA-C  Cyanocobalamin 2000 MCG TBCR Take by mouth.    [provider]  fexofenadine (ALLEGRA) 180 MG tablet Take  1 tablet (180 mg total) by mouth daily. 09/12/20   Rodriguez-Southworth, Sunday Spillers, PA-C  fluticasone (FLONASE) 50 MCG/ACT nasal spray 1 spray into each nostril daily. 12/27/20 12/27/21  [provider]  lisinopril (ZESTRIL) 30 MG tablet Take 1 tablet by mouth daily. 12/29/20 12/29/21  [provider]  methocarbamol (ROBAXIN) 500 MG tablet Take 1 tablet (500 mg total) by mouth 2 (two) times daily. 01/14/22   Brimage, Ronnette Juniper, DO  traMADol (ULTRAM) 50 MG tablet Take 1 tablet (50 mg total) by mouth every 6 (six) hours as needed. 09/12/20   Rodriguez-Southworth, Sunday Spillers, PA-C  UNABLE TO FIND Med Name: HCTZ 10 mg    [provider]    Family History No family history on file.  Social History Social History   Tobacco Use   Smoking status: Never   Smokeless tobacco: Never  Vaping Use   Vaping Use: Never used  Substance Use Topics   Alcohol use: Never   Drug use: Never     Allergies   Amoxicillin, Ciprofloxacin, Keflex [cephalexin], Hydrocodone-acetaminophen, and Sulfamethoxazole   Review of Systems Review of Systems  Constitutional:  Negative for fatigue and fever.  Respiratory:  Negative for shortness of breath.   Cardiovascular:  Negative for leg swelling.  Musculoskeletal:  Negative for arthralgias, gait problem and joint swelling.  Skin:  Positive for color change and rash.  Neurological:  Negative for weakness and numbness.     Physical Exam Triage Vital Signs ED Triage Vitals  Enc Vitals Group     BP 02/12/22 1239 (!) 160/80     Pulse Rate 02/12/22 1239 78     Resp 02/12/22 1239 16     Temp 02/12/22 1239 98.5 F (36.9 C)     Temp Source 02/12/22 1239 Oral     SpO2 02/12/22 1239 94 %     Weight 02/12/22 1237 171 lb 15.3 oz (78 kg)     Height 02/12/22 1237 5' 2"$  (1.575 m)     Head Circumference --      Peak Flow --      Pain Score 02/12/22 1236 0     Pain Loc --      Pain Edu? --      Excl. in [place]? --    No data found.  Updated Vital  Signs BP (!) 160/80 (BP Location: Left Arm)   Pulse 78   Temp 98.5 F (36.9 C) (Oral)   Resp 16   Ht 5' 2"$  (1.575 m)   Wt 171 lb 15.3 oz (78 kg)   SpO2 94%   BMI 31.45 kg/m   Physical Exam Vitals and nursing note reviewed.  Constitutional:      General: She is not in acute distress.    Appearance: Normal appearance. She is not ill-appearing or toxic-appearing.  HENT:     Head: Normocephalic and atraumatic.     Nose: Nose normal.     Mouth/Throat:  Mouth: Mucous membranes are moist.     Pharynx: Oropharynx is clear.  Eyes:     General: No scleral icterus.       Right eye: No discharge.        Left eye: No discharge.     Conjunctiva/sclera: Conjunctivae normal.  Cardiovascular:     Rate and Rhythm: Normal rate and regular rhythm.     Heart sounds: Normal heart sounds.  Pulmonary:     Effort: Pulmonary effort is normal. No respiratory distress.     Breath sounds: Normal breath sounds.  Musculoskeletal:     Cervical back: Neck supple.  Skin:    General: Skin is dry.     Findings: Rash present.     Comments: Small erythematous papule of the left anterior leg with surrounding mild increased warmth. No TTP. Extending out from that is a petechial rash.  Neurological:     General: No focal deficit present.     Mental Status: She is alert. Mental status is at baseline.     Motor: No weakness.     Gait: Gait normal.  Psychiatric:        Mood and Affect: Mood normal.        Behavior: Behavior normal.        Thought Content: Thought content normal.      UC Treatments / Results  Labs (all labs ordered are listed, but only abnormal results are displayed) Labs Reviewed - No data to display  EKG   Radiology No results found.  Procedures Procedures (including critical care time)  Medications Ordered in UC Medications - No data to display  Initial Impression / Assessment and Plan / UC Course  I have reviewed the triage vital signs and the nursing  notes.  Pertinent labs & imaging results that were available during my care of the patient were reviewed by me and considered in my medical decision making (see chart for details).   82 year old female presents for suspected bug bite and possible infection of the right anterior lower leg.  Patient reports that she noticed a tiny bump a few days ago, scratched it and then developed a rash.  He has been taking doxycycline for the past couple days and states symptoms improved.  Patient does have a history of polycythemia and is currently being seen by hematologist.  I have included an image in the chart of patient's rash.  She does have a tiny erythematous papule with slightly increased warmth.  Extending out from that is a petechial rash.  Suspect possible mild underlying infection.  She is reporting some improvement after taking antibiotics.  She would like to continue antibiotics.  Advised to discontinue the doxycycline and start cefdinir.  She reports that she is taking Keflex in the past and has had palpitations with it.  Patient does have listed allergies for amoxicillin, Keflex, Bactrim and Cipro.  Advised her to try the cefdinir but if she cannot tolerate it to give [place] a call.  For any acute/adverse reactions, advised to call 911 or go to ER.  Sent triamcinolone topical.  Continue to follow-up with hematologist.  Marena Chancy if the petechial rash could be related to underlying undiagnosed hematological problem.   Final Clinical Impressions(s) / UC Diagnoses   Final diagnoses:  Cellulitis of right anterior lower leg  Petechial rash     Discharge Instructions      -The rash may be due to mild infection, but it also could be related to your underlying  blood cell problems.  -I have sent an antibiotic to the pharmacy for you for possible infection as well as a topical corticosteroid. Also try to elevate and ice the area to help with swelling. -If you cannot tolerate the antibiotic let us know. If  you have any swelling or breathing trouble call 911. -Follow up with PCP and hematologist     ED Prescriptions     Medication Sig Dispense Auth. Provider   triamcinolone (KENALOG) 0.025 % ointment Apply 1 Application topically 2 (two) times daily for 7 days. 30 g Laurene Footman B, PA-C   cefdinir (OMNICEF) 300 MG capsule Take 1 capsule (300 mg total) by mouth 2 (two) times daily for 7 days. 14 capsule Danton Clap, PA-C      PDMP not reviewed this encounter.   Danton Clap, PA-C 02/12/22 1337

## 2022-02-13 ENCOUNTER — Encounter
Admit: 2022-02-13 | Discharge: 2022-02-13 | Payer: MEDICARE | Attending: Hematology & Oncology | Primary: Hematology & Oncology

## 2022-02-14 ENCOUNTER — Other Ambulatory Visit: Admit: 2022-02-14 | Discharge: 2022-02-14 | Payer: MEDICARE

## 2022-02-14 ENCOUNTER — Ambulatory Visit: Admit: 2022-02-14 | Discharge: 2022-02-14 | Payer: MEDICARE

## 2022-02-14 DIAGNOSIS — C884 Extranodal marginal zone B-cell lymphoma of mucosa-associated lymphoid tissue [MALT-lymphoma]: Principal | ICD-10-CM

## 2022-02-14 LAB — COMPREHENSIVE METABOLIC PANEL
ALBUMIN: 4.2 g/dL (ref 3.4–5.0)
ALKALINE PHOSPHATASE: 51 U/L (ref 46–116)
ALT (SGPT): 20 U/L (ref 10–49)
ANION GAP: 5 mmol/L (ref 5–14)
AST (SGOT): 20 U/L (ref <=34)
BILIRUBIN TOTAL: 0.7 mg/dL (ref 0.3–1.2)
BLOOD UREA NITROGEN: 23 mg/dL (ref 9–23)
BUN / CREAT RATIO: 28
CALCIUM: 9.9 mg/dL (ref 8.7–10.4)
CHLORIDE: 109 mmol/L — ABNORMAL HIGH (ref 98–107)
CO2: 26 mmol/L (ref 20.0–31.0)
CREATININE: 0.81 mg/dL
EGFR CKD-EPI (2021) FEMALE: 72 mL/min/{1.73_m2} (ref >=60)
GLUCOSE RANDOM: 150 mg/dL (ref 70–179)
POTASSIUM: 4.9 mmol/L — ABNORMAL HIGH (ref 3.4–4.8)
PROTEIN TOTAL: 7.2 g/dL (ref 5.7–8.2)
SODIUM: 140 mmol/L (ref 135–145)

## 2022-02-14 LAB — CBC W/ AUTO DIFF
BASOPHILS ABSOLUTE COUNT: 0.1 10*9/L (ref 0.0–0.1)
BASOPHILS RELATIVE PERCENT: 1.3 %
EOSINOPHILS ABSOLUTE COUNT: 0.1 10*9/L (ref 0.0–0.5)
EOSINOPHILS RELATIVE PERCENT: 0.8 %
HEMATOCRIT: 58.4 % — ABNORMAL HIGH (ref 34.0–44.0)
HEMOGLOBIN: 18.8 g/dL — ABNORMAL HIGH (ref 11.3–14.9)
LYMPHOCYTES ABSOLUTE COUNT: 1 10*9/L — ABNORMAL LOW (ref 1.1–3.6)
LYMPHOCYTES RELATIVE PERCENT: 14.9 %
MEAN CORPUSCULAR HEMOGLOBIN CONC: 32.2 g/dL (ref 32.0–36.0)
MEAN CORPUSCULAR HEMOGLOBIN: 27 pg (ref 25.9–32.4)
MEAN CORPUSCULAR VOLUME: 84.1 fL (ref 77.6–95.7)
MEAN PLATELET VOLUME: 8.4 fL (ref 6.8–10.7)
MONOCYTES ABSOLUTE COUNT: 0.3 10*9/L (ref 0.3–0.8)
MONOCYTES RELATIVE PERCENT: 5 %
NEUTROPHILS ABSOLUTE COUNT: 5.1 10*9/L (ref 1.8–7.8)
NEUTROPHILS RELATIVE PERCENT: 78 %
PLATELET COUNT: 236 10*9/L (ref 150–450)
RED BLOOD CELL COUNT: 6.94 10*12/L — ABNORMAL HIGH (ref 3.95–5.13)
RED CELL DISTRIBUTION WIDTH: 16.2 % — ABNORMAL HIGH (ref 12.2–15.2)
WBC ADJUSTED: 6.5 10*9/L (ref 3.6–11.2)

## 2022-02-14 LAB — SLIDE REVIEW

## 2022-02-14 LAB — LACTATE DEHYDROGENASE: LACTATE DEHYDROGENASE: 249 U/L — ABNORMAL HIGH (ref 120–246)

## 2022-02-14 MED ADMIN — Fluorine F-18 FDG 4-40 mCi IV: 5 | INTRAVENOUS | @ 13:00:00 | Stop: 2022-02-14

## 2022-02-14 NOTE — Unmapped (Signed)
IDENTIFICATION: This is a 82 y.o. 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  She was negative for HIV and HBV.  Staging PET-CT has not been ordered.    PROGNOSIS:  A validated prognostic score for follicular lymphoma is the FLIPI score (Solal-Celigny, Blood, 2004), which has been validated in marginal zone lymphoma (Heilgeist, Cancer, 2013). Among 144 patients with MZL, the 5-year PFS for patients who had FLIPI scores of 0 to 2 (low or intermediate risk) was excellent at 92%, whereas it was only 62% for patients who had FLIPI scores of 3 to 5 (poor risk; P = .003). Similarly, the 5-year OS for patients who had FLIPI scores of 0 to 2 was 95%, whereas it was only 62% for patients who had FLIPI scores of 3 to 5 (P = .0009). This patient had the following FLIPI score prognostic factors: age >76, correlating with a low-risk (0-1 points, 10-yr OS 70%) FLIPI score.     A validated prognostic score for MALT lymphoma is the MALT-IPI ([place], Blood, 2017). This score is comprised of 3 factors: age >84, elevated LDH, and stage III/IV. This patient has none of these factors and has low risk disease (22yr EFS 70%; 67yr OS 99%).       TREATMENT:  The standard approach for advanced MZL is surveillance unless there are GELF criteria present. The patient is asymptomatic so we will initiate a surveillance protocol. Standard surveillance consists of a history, physical exam and labwork every 4 months for the first year. Additionally, patients should have a CT CAP every 6 months to monitor for disease progression. After the first year, if the disease is stable, the follow-up intervals can be lengthened.     Her TTE 04/12/21 showed EF 35-40% which is 2/2 CHF. She had a positive peripheral blood flow on 02/24/2020, so she likely has a low level of active lymphoma. Recent CTA was neg for LAD and CT in 2021 was also not c/f lymphoma. She had elevated HGB at 17.8 11/08/21. JAK2 mutation+. Refer to MPN clinic.      Patient presents for PET review. PET showed mild increased bone marrow uptake which is nonspecific, potentially related to polycythemia. Patient has no LN activity. Continue surveillance. RTC in 6 mo for surveillance visit, consider spacing to yearly in following visits.     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 Pt seen by Dr. Leotis Pain in clinic with plan to start hydrea 500mg  daily. Pt hesitant to start treatment but she was reassured that the benefit outweighs the risk of hydrea.    SUMMARY:  - Diagnosed with orbital MZL in 1999  - Treated with 3-4 months of Cytoxan  - Recurrence of MZL to lower eyelid R eye in 10/2002  - Underwent local radiation to eye. CT scans 11/20/02 revealed no evidence of recurrent tumor  - On leukocytopenia work-up 02/24/20, peripheral  blood flow cytometry revealed low-level CD5-negative/CD10-negative b-cell population representing <1% of peripheral WBC's.   - CT CAP negative in 07/2019  - CTA neg 02/2021  - TTE 04/12/21: EF 35-40%  - Hg 17.8; JAK2+: Dr. Leotis Pain following   -starting hydrea 500mg  daily  - PET (02/14/22): mild increase BM uptake, nonspecific.   - RTC in 6 mo, consider spacing after next visit.     A total of 40 minutes were spent face-to-face and non-face-to-face in the care of this patient, which includes all pre, intra, and post visit time on the date of service.  ______________________________________________________________________    Documentation assistance was provided by Seabron Spates, Scribe, on February 13, 2022 at 7:39 PM for Winona Legato, DO, MPH    -------------------------------------------------------  February 16, 2022 3:06 PM. Documentation assistance provided by the Scribe. I was present during the time the encounter was recorded. The information recorded by the Scribe was done at my direction and has been reviewed and validated by me.    Winona Legato, DO, MPH  Associate Professor of Medicine  Division of Hematology and Oncology        INTERVAL HISTORY:  Ms. Lukes presents for surveillance visit and is accompanied by her son. She has had 3 trips to urgent care in the interim, two of which were associated with cellulitis associated with bug bites (12/08/21 and 02/12/22) and one of which was associated with urinary pain. Patient feels fairly well. She will begin hydrea shortly, associated with Dr. Leotis Pain. I counseled patient that the benefits of hydrea outweigh the risks. Denies fevers, chills, nausea, vomiting, diarrhea, chest pain, shortness of breath, cough, early satiety, weight loss, new LAD, or concerning symptoms.     HISTORY OF PRESENT ILLNESS:  Ms. Heinrich is a 82 y.o. Caucasian female who we are seeing in consultation at the request of Dr. Theadora Rama for further evaluation of leukopenia.  Patient has a h/o orbital marginal zone lymphoma originally diagnosed in 1999. Treated with primary resection and Cytoxan for 3-4 months. Had local recurrence in 10/2002 involving R eye lower eyelid without ocular involvement, treated with local radiation in [place]. Previously followed by [facility], with last visit in 04/2012. Per available records, WBCs have been predominantly in the 2.6-3.6 range with ANCs 1.1-2.3 range and ALCs 0.4-1.5 range dating back to 2010.  Patient presented to [facility] 02/24/20 for follow-up evaluation of leukocytopenia. In work-up, her peripheral blood flow cytometry showed mild lymphopenia with low-level CD5-negative/CD10-negative monotypic B-cell population, representing <1% (23 cells/uL) of the peripheral white blood cells. Given previous history of marginal zone lymphoma, was referred [facility] lymphoma clinic.     She is here with a one point cane accompanied by her son. Patient developed non-drenching night sweats in early 02/2020 and became febrile (110.7) on 02/21/20. She is doing well today. Her BM is normal. 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 She does have some mild tachycardia. She reports having some fluid problems given her CHF hx- manages with fluid pills. Denies fevers, chills, night sweats, nausea, vomiting, diarrhea, chest pain, shortness of breath, cough, early satiety, weight loss, new LAD, or concerning symptoms.     PAST MEDICAL HISTORY:  Past Medical History:   Diagnosis Date    At risk for falls     left ankle arthritis; cane     Coronary artery disease     Diabetes mellitus (CMS-HCC)     Diffuse large B cell lymphoma (CMS-HCC)  Hypertension     Impaired mobility     left ankle arthritis, cane    Visual impairment     glasses       MEDICATIONS:  Current Outpatient Medications   Medication Sig Dispense Refill    albuterol HFA 90 mcg/actuation inhaler Inhale 2 puffs every six (6) hours as needed for wheezing. 18 g 0    amlodipine (NORVASC) 10 MG tablet Take 1 tablet (10 mg total) by mouth daily. 90 tablet 3    aspirin (ECOTRIN) 81 MG tablet Take 1 tablet (81 mg total) by mouth daily. 30 tablet 11    atorvastatin (LIPITOR) 10 MG tablet Take 1 tablet (10 mg total) by mouth daily. 90 tablet 3    betamethasone dipropionate 0.05 % lotion Apply topically Two (2) times a day. 60 mL 1    blood sugar diagnostic (ACCU-CHEK GUIDE TEST STRIPS) Strp by Other route Two (2) times a day (30 minutes before a meal). 200 each 1    blood-glucose meter kit Use as instructed 1 each 0    calcium-vitamin D 500 mg(1,250mg ) -200 unit per tablet Take 1 tablet by mouth in the morning and 1 tablet in the evening. Take with meals.      cyanocobalamin 2000 MCG tablet Take 1 tablet (2,000 mcg total) by mouth daily. (Patient not taking: Reported on 10/31/2021)      estradioL (ESTRACE) 0.01 % (0.1 mg/gram) vaginal cream Insert 2 g into the vagina daily. Use externally prn      fenofibrate (TRICOR) 145 MG tablet Take 1 tablet (145 mg total) by mouth daily. 90 tablet 3    fluocinolone acetonide oil 0.01 % Drop Administer 1 drop into the left ear two (2) times a day. 20 mL 0    fluticasone propionate (FLONASE) 50 mcg/actuation nasal spray 1 spray into each nostril daily. 16 g 11    fluticasone propionate (FLONASE) 50 mcg/actuation nasal spray 1 spray into each nostril daily. 16 g 11    furosemide (LASIX) 20 MG tablet TAKE 1 TABLET BY MOUTH THREE TIMES A WEEK 36 tablet 0    guaiFENesin (MUCINEX) 600 mg 12 hr tablet Take 1 tablet (600 mg total) by mouth two (2) times a day as needed for congestion.  0    hydroxyurea (HYDREA) 500 mg capsule Take 1 capsule (500 mg total) by mouth daily. 30 capsule 2    ketoconazole (NIZORAL) 2 % cream APPLY  CREAM TOPICALLY ONCE DAILY FOR SCALP RASH 30 g 0    lancets (ACCU-CHEK SOFTCLIX LANCETS) Misc Use as directed to check glucose twice per day 200 each 5    lancing device Misc USE TO CHECK BLOOD SUGAR 2 TIMES A DAY 100 each 2    lisinopriL (PRINIVIL,ZESTRIL) 30 MG tablet Take 1 tablet (30 mg total) by mouth daily. 90 tablet 3    LORazepam (ATIVAN) 0.5 MG tablet TAKE 1/2 (ONE-HALF) TABLET BY MOUTH TWICE DAILY AS NEEDED FOR ANXIETY 30 tablet 0    meloxicam (MOBIC) 7.5 MG tablet Take 1 tablet (7.5 mg total) by mouth daily. 90 tablet 1    metFORMIN (GLUCOPHAGE) 500 MG tablet 1 tablet in the AM and 1/2 tab with dinner daily 180 tablet 3    metoPROLOL succinate (TOPROL-XL) 50 MG 24 hr tablet TAKE 1 TABLET BY MOUTH AT BEDTIME 90 tablet 3    mupirocin (BACTROBAN) 2 % ointment Apply 1 Application topically Two (2) times a day (at 8am and 12:00).      RESTASIS  0.05 % ophthalmic emulsion INSTILL 1 DROP INTO EACH EYE TWICE DAILY      spironolactone (ALDACTONE) 25 MG tablet Take 1 tablet (25 mg total) by mouth daily. 90 tablet 3    traMADoL (ULTRAM) 50 mg tablet Take 1 tablet (50 mg total) by mouth every six (6) hours as needed for pain. (Patient not taking: Reported on 10/31/2021) 30 tablet 0     No current facility-administered medications for this visit.       ALLERGIES:  Allergies   Allergen Reactions    Amoxicillin      Other Reaction(s): Unknown    Cephalexin Palpitations    Ciprofloxacin Palpitations       Legacy System: Abran Cantor  Onset Date: <blank>  Substance Legacy/Cerner: Cipro / Cipro (Legacy value)  Category: Drug  Severity Legacy/Cerner: <blank> / Unknown  Reaction(s): makes head feel funny  nausea  Comments: <blank>    Hydrocodone-Acetaminophen Nausea And Vomiting    Sulfamethoxazole Palpitations       SOCIAL HISTORY:  Social History     Socioeconomic History    Marital status: Widowed   Tobacco Use    Smoking status: Never    Smokeless tobacco: Never   Vaping Use    Vaping status: Never Used   Substance and Sexual Activity    Alcohol use: Not Currently    Drug use: Never    Sexual activity: Not Currently     Social Determinants of Health     Financial Resource Strain: Low Risk  (02/14/2021)    Overall Financial Resource Strain (CARDIA)     Difficulty of Paying Living Expenses: Not hard at all   Food Insecurity: No Food Insecurity (02/14/2021)    Hunger Vital Sign     Worried About Running Out of Food in the Last Year: Never true     Ran Out of Food in the Last Year: Never true   Transportation Needs: No Transportation Needs (02/14/2021)    PRAPARE - Transportation     Lack of Transportation (Medical): No     Lack of Transportation (Non-Medical): No   Stress: No Stress Concern Present (10/16/2021)    [employer] of Occupational Health - Occupational Stress Questionnaire     Feeling of Stress : Not at all       FAMILY HISTORY:  Family History   Problem Relation Age of Onset    Cancer Other     Diabetes Mother     Asthma Mother     Hypertension Father     Stroke Father        REVIEW OF SYSTEMS:  See HPI. A 10 system ROS is otherwise negative.    VITAL SIGNS:   There were no vitals filed for this visit.        EXAM:  ECOG: 1  CONST: NAD, Awake, Alert  HEENT: Oropharynx clear.   LYMPH: No cervical, supraclavicular, axillary, or inguinal LAD  RESP: Clear to auscultation bilaterally.  CV: Regular rate and rhythm. No rubs, gallops or murmurs.   GI: Soft, nontender, nondistended. No hepatosplenomegaly.   MSK: Trace edema to b/l LE. L ankle brace.   NEURO: No focal deficits    LABORATORY:  No visits with results within 1 Day(s) from this visit.   Latest known visit with results is:   Lab on 02/06/2022   Component Date Value Ref Range Status    LDH 02/06/2022 399 (H)  120 - 246 U/L Final    Reticulocyte Auto % 02/06/2022 1.42  0.50 - 2.17 % Final    Absolute Auto Reticulocyte 02/06/2022 100.4 (H)  23.0 - 100.0 10*9/L Final    Ferritin 02/06/2022 27.5  7.3 - 270.7 ng/mL Final    Iron 02/06/2022 51  50 - 170 ug/dL Final    TIBC 28/41/3244 392  250 - 425 ug/dL Final    Iron Saturation (%) 02/06/2022 13 (L)  20 - 55 % Final    Specimen Info 02/06/2022    Final                    Value:This result contains rich text formatting which cannot be displayed here.    Myeloid Mutation Panel - MDS & MPN 02/06/2022    Final                    Value:This result contains rich text formatting which cannot be displayed here.    Sodium 02/06/2022 139  135 - 145 mmol/L Final    Potassium 02/06/2022 5.3 (H)  3.5 - 5.1 mmol/L Final    Chloride 02/06/2022 106  98 - 107 mmol/L Final    CO2 02/06/2022 23.0  20.0 - 31.0 mmol/L Final    Anion Gap 02/06/2022 10  5 - 14 mmol/L Final    BUN 02/06/2022 25 (H)  9 - 23 mg/dL Final    Creatinine 01/03/7251 0.92  0.55 - 1.02 mg/dL Final    BUN/Creatinine Ratio 02/06/2022 27   Final    eGFR CKD-EPI (2021) Female 02/06/2022 62  >=60 mL/min/1.58m2 Final    eGFR calculated with CKD-EPI 2021 equation in accordance with [employer] and [employer] of Nephrology Task Force recommendations.    Glucose 02/06/2022 116  70 - 179 mg/dL Final    Calcium 66/44/0347 10.0  8.7 - 10.4 mg/dL Final    Albumin 42/59/5638 4.1  3.4 - 5.0 g/dL Final    Total Protein 02/06/2022 7.2  5.7 - 8.2 g/dL Final    Total Bilirubin 02/06/2022 0.6  0.3 - 1.2 mg/dL Final    AST 75/64/3329 29  <=34 U/L Final    ALT 02/06/2022 23  10 - 49 U/L Final    Alkaline Phosphatase 02/06/2022 51  46 - 116 U/L Final    WBC 02/06/2022 7.8  3.6 - 11.2 10*9/L Final    RBC 02/06/2022 7.08 (H)  3.95 - 5.13 10*12/L Final    HGB 02/06/2022 19.5 (H)  11.3 - 14.9 g/dL Final    HCT 51/88/4166 59.7 (H)  34.0 - 44.0 % Final    MCV 02/06/2022 84.4  77.6 - 95.7 fL Final    MCH 02/06/2022 27.6  25.9 - 32.4 pg Final    MCHC 02/06/2022 32.7  32.0 - 36.0 g/dL Final    RDW 07/01/1599 16.3 (H)  12.2 - 15.2 % Final    MPV 02/06/2022 8.7  6.8 - 10.7 fL Final    Platelet 02/06/2022 262  150 - 450 10*9/L Final    Neutrophils % 02/06/2022 75.0  % Final    Lymphocytes % 02/06/2022 17.0  % Final    Monocytes % 02/06/2022 6.2  % Final    Eosinophils % 02/06/2022 0.5  % Final    Basophils % 02/06/2022 1.3  % Final    Absolute Neutrophils 02/06/2022 5.9  1.8 - 7.8 10*9/L Final    Absolute Lymphocytes 02/06/2022 1.3  1.1 - 3.6 10*9/L Final    Absolute Monocytes 02/06/2022 0.5  0.3 - 0.8 10*9/L Final  Absolute Eosinophils 02/06/2022 0.0  0.0 - 0.5 10*9/L Final    Absolute Basophils 02/06/2022 0.1  0.0 - 0.1 10*9/L Final    Anisocytosis 02/06/2022 Slight (A)  Not Present Final    Smear Review Comments 02/06/2022 See Comment (A)  Undefined Final    Smear reviewed  Instrument ID [id number]  Reactive lymphocytes present    Giant Platelets 02/06/2022 Present (A)  Not Present Final       IMAGING:  PET 02/14/22  1. Mildly diffusely increased bone marrow uptake, which is a highly nonspecific finding. While this could represent some degree of lymphoma involvement, could also be seen with other conditions such as hypoxia.    2. Otherwise, no evidence of FDG avid lymphoma    CTA chest w/o contrast 07/28/19:   -No pulmonary embolism.  -Multifocal groundglass opacities with mild interlobular septal thickening and trace pleural effusions likely pulmonary edema.     MRI abdomen 07/21/19:   -Sequela of cholecystectomy. No evidence of retained biliary ductal stone or stricture.  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    PATHOLOGY:  Peripheral blood flow cytometry 02/24/20:   Diagnosis   Peripheral blood, smear review and flow cytometry  -  Lymphopenia with low-level CD5-negative/CD10-negative monotypic B-cell population identified, representing <1% (23 cells/uL) of the peripheral white blood cells by flow cytometric analysis (See Comment)     This electronic signature is attestation that the pathologist personally reviewed the submitted material(s) and the final diagnosis reflects that evaluation.   Electronically signed by Sondra Come, MD on 02/26/2020 at 1058   Diagnosis Comment    While the significance of the numerically small CD5-negative/CD10-negative monotypic B-cell population identified in the peripheral blood is uncertain, this likely represents low-level involvement by the patient's reported previously diagnosed marginal zone lymphoma. Clinical and radiographic correlation is necessary for further characterization.

## 2022-02-15 DIAGNOSIS — I10 Essential (primary) hypertension: Principal | ICD-10-CM

## 2022-02-15 DIAGNOSIS — G250 Essential tremor: Principal | ICD-10-CM

## 2022-02-15 DIAGNOSIS — G4700 Insomnia, unspecified: Principal | ICD-10-CM

## 2022-02-15 MED ORDER — LORAZEPAM 0.5 MG TABLET
ORAL_TABLET | 0 refills | 0 days | Status: CP
Start: 2022-02-15 — End: ?

## 2022-02-15 MED ORDER — FUROSEMIDE 20 MG TABLET
ORAL_TABLET | 0 refills | 0 days | Status: CP
Start: 2022-02-15 — End: ?

## 2022-02-16 ENCOUNTER — Ambulatory Visit: Admit: 2022-02-16 | Discharge: 2022-02-17 | Payer: MEDICARE

## 2022-02-16 DIAGNOSIS — C884 Extranodal marginal zone B-cell lymphoma of mucosa-associated lymphoid tissue [MALT-lymphoma]: Principal | ICD-10-CM

## 2022-02-16 NOTE — Unmapped (Addendum)
Notes:  We will see you in clinic in 6 months.     Labwork:  No visits with results within 1 Day(s) from this visit.   Latest known visit with results is:   Hospital Outpatient Visit on 02/14/2022   Component Date Value Ref Range Status    Glucose, POC 02/14/2022 160  70 - 179 mg/dL Final       Please call us if you experience:   1. Nausea or vomiting not controlled by nausea medicines  2. Fever of 100.4 F or higher   3. Uncontrolled pain  4. Any other concerning symptom     For any cancer-related concerns, including:   Appointment information   Questions regarding your cancer diagnosis or treatment   Any new symptoms.     Please call [phone].    For EMERGENCIES on Nights, Weekends and Holidays please call and ask for the oncologist on call.    [facility]  [address]  [place], [place] [address]  [email or web address]

## 2022-02-23 ENCOUNTER — Ambulatory Visit: Admit: 2022-02-23 | Discharge: 2022-02-24 | Payer: MEDICARE

## 2022-02-23 ENCOUNTER — Other Ambulatory Visit: Admit: 2022-02-23 | Discharge: 2022-02-24 | Payer: MEDICARE

## 2022-02-23 DIAGNOSIS — D751 Secondary polycythemia: Principal | ICD-10-CM

## 2022-02-23 DIAGNOSIS — D509 Iron deficiency anemia, unspecified: Principal | ICD-10-CM

## 2022-02-23 DIAGNOSIS — E611 Iron deficiency: Principal | ICD-10-CM

## 2022-02-23 LAB — COMPREHENSIVE METABOLIC PANEL
ALBUMIN: 4 g/dL (ref 3.4–5.0)
ALKALINE PHOSPHATASE: 53 U/L (ref 46–116)
ALT (SGPT): 25 U/L (ref 10–49)
ANION GAP: 7 mmol/L (ref 5–14)
AST (SGOT): 31 U/L (ref <=34)
BILIRUBIN TOTAL: 0.7 mg/dL (ref 0.3–1.2)
BLOOD UREA NITROGEN: 34 mg/dL — ABNORMAL HIGH (ref 9–23)
BUN / CREAT RATIO: 34
CALCIUM: 10.4 mg/dL (ref 8.7–10.4)
CHLORIDE: 107 mmol/L (ref 98–107)
CO2: 26 mmol/L (ref 20.0–31.0)
CREATININE: 1 mg/dL
EGFR CKD-EPI (2021) FEMALE: 56 mL/min/{1.73_m2} — ABNORMAL LOW (ref >=60)
GLUCOSE RANDOM: 155 mg/dL (ref 70–179)
POTASSIUM: 5.6 mmol/L — ABNORMAL HIGH (ref 3.5–5.1)
PROTEIN TOTAL: 7.1 g/dL (ref 5.7–8.2)
SODIUM: 140 mmol/L (ref 135–145)

## 2022-02-23 LAB — CBC
HEMATOCRIT: 60.6 % (ref 34.0–44.0)
HEMOGLOBIN: 19.5 g/dL — ABNORMAL HIGH (ref 11.3–14.9)
MEAN CORPUSCULAR HEMOGLOBIN CONC: 32.2 g/dL (ref 32.0–36.0)
MEAN CORPUSCULAR HEMOGLOBIN: 27.2 pg (ref 25.9–32.4)
MEAN CORPUSCULAR VOLUME: 84.6 fL (ref 77.6–95.7)
MEAN PLATELET VOLUME: 8.7 fL (ref 6.8–10.7)
PLATELET COUNT: 261 10*9/L (ref 150–450)
RED BLOOD CELL COUNT: 7.16 10*12/L — ABNORMAL HIGH (ref 3.95–5.13)
RED CELL DISTRIBUTION WIDTH: 16.3 % — ABNORMAL HIGH (ref 12.2–15.2)
WBC ADJUSTED: 5.9 10*9/L (ref 3.6–11.2)

## 2022-02-23 LAB — IRON & TIBC
IRON SATURATION: 18 % — ABNORMAL LOW (ref 20–55)
IRON: 73 ug/dL
TOTAL IRON BINDING CAPACITY: 398 ug/dL (ref 250–425)

## 2022-02-23 LAB — FERRITIN: FERRITIN: 25.3 ng/mL

## 2022-02-23 MED ADMIN — sodium chloride 0.9% (NS) bolus 250 mL: 250 mL | INTRAVENOUS | @ 19:00:00 | Stop: 2022-02-23

## 2022-02-23 NOTE — Unmapped (Signed)
1410 - pt ambulated with rolling walker with stable gait for therapeutic phlebotomy. Pt states feeling well and offers no complaints at this time. Pt with 18G PIV to RAC PTA - flushed with good BR.  Pt labs within parameters to procced. 250grams/233ml drained via gravity from 18G PIV to RAC and pt tolerated well. NS infusing after therapuetic phlebotomy and pt resting in NAD.    15:25 - pt tolerated fluid infusion and PIV with good BR afterwards. PIV flushed and removed per protocol and pt given copy of AVS and ambulated with rolling walker from treatment area to meet family member in lobby.

## 2022-02-23 NOTE — Unmapped (Signed)
RED ZONE Means: RED ZONE: Take action now!     You need to be seen right away  Symptoms are at a severe level of discomfort    Call 911 or go to your nearest  Hospital for help     - Bleeding that will not stop    - Hard to breathe    - New seizure - Chest pain  - Fall or passing out  -Thoughts of hurting    yourself or others      Call 911 if you are going into the RED ZONE                  YELLOW ZONE Means:     Please call with any new or worsening symptom(s), even if not on this list.  Call [phone]  After hours, weekends, and holidays - you will reach a long recording with specific instructions, If not in an emergency such as above, please listen closely all the way to the end and choose the option that relates to your need.   You can be seen by a provider the same day through our Same Day Acute Care for Patients with Cancer program.      YELLOW ZONE: Take action today     Symptoms are new or worsening  You are not within your goal range for:    - Pain    - Shortness of breath    - Bleeding (nose, urine, stool, wound)    - Feeling sick to your stomach and throwing up    - Mouth sores/pain in your mouth or throat    - Hard stool or very loose stools (increase in       ostomy output)    - No urine for 12 hours    - Feeding tube or other catheter/tube issue    - Redness or pain at previous IV or port/catheter site    - Depressed or anxiety   - Swelling (leg, arm, abdomen,     face, neck)  - Skin rash or skin changes  - Wound issues (redness, drainage,    re-opened)  - Confusion  - Vision changes  - Fever >100.4 F or chills  - Worsening cough with mucus that is    green, yellow, or bloody  - Pain or burning when going to the    bathroom  - Home Infusion Pump Issue- call    [phone]         Call your healthcare provider if you are going into the YELLOW ZONE     GREEN ZONE Means:  Your symptoms are under controls  Continue to take your medicine as ordered  Keep all visits to the provider GREEN ZONE: You are in control  No increase or worsening symptoms  Able to take your medicine  Able to drink and eat    - DO NOT use MyChart messages to report red or yellow symptoms. Allow up to 3    business days for a reply.  -MyChart is for non-urgent medication refills, scheduling requests, or other general questions.         [id number] Rev. 06/30/2021  Approved by Oncology Patient Education Committee        Lab on 02/23/2022   Component Date Value Ref Range Status    WBC 02/23/2022 5.9  3.6 - 11.2 10*9/L Final    RBC 02/23/2022 7.16 (H)  3.95 - 5.13 10*12/L Final    HGB 02/23/2022 19.5 (H)  11.3 - 14.9 g/dL Final    HCT 16/10/9602 60.6 (HH)  34.0 - 44.0 % Final    MCV 02/23/2022 84.6  77.6 - 95.7 fL Final    MCH 02/23/2022 27.2  25.9 - 32.4 pg Final    MCHC 02/23/2022 32.2  32.0 - 36.0 g/dL Final    RDW 54/09/8117 16.3 (H)  12.2 - 15.2 % Final    MPV 02/23/2022 8.7  6.8 - 10.7 fL Final    Platelet 02/23/2022 261  150 - 450 10*9/L Final    Ferritin 02/23/2022 25.3  7.3 - 270.7 ng/mL Final    Iron 02/23/2022 73  50 - 170 ug/dL Final    TIBC 14/78/2956 398  250 - 425 ug/dL Final    Iron Saturation (%) 02/23/2022 18 (L)  20 - 55 % Final    Sodium 02/23/2022 140  135 - 145 mmol/L Final    Potassium 02/23/2022 5.6 (H)  3.5 - 5.1 mmol/L Final    Chloride 02/23/2022 107  98 - 107 mmol/L Final    CO2 02/23/2022 26.0  20.0 - 31.0 mmol/L Final    Anion Gap 02/23/2022 7  5 - 14 mmol/L Final    BUN 02/23/2022 34 (H)  9 - 23 mg/dL Final    Creatinine 21/30/8657 1.00  0.55 - 1.02 mg/dL Final    BUN/Creatinine Ratio 02/23/2022 34   Final    eGFR CKD-EPI (2021) Female 02/23/2022 56 (L)  >=60 mL/min/1.45m2 Final    eGFR calculated with CKD-EPI 2021 equation in accordance with [employer] and [employer] of Nephrology Task Force recommendations.    Glucose 02/23/2022 155  70 - 179 mg/dL Final    Calcium 84/69/6295 10.4  8.7 - 10.4 mg/dL Final    Albumin 28/41/3244 4.0  3.4 - 5.0 g/dL Final    Total Protein 02/23/2022 7.1  5.7 - 8.2 g/dL Final    Total Bilirubin 02/23/2022 0.7  0.3 - 1.2 mg/dL Final    AST 01/03/7251 31  <=34 U/L Final    ALT 02/23/2022 25  10 - 49 U/L Final    Alkaline Phosphatase 02/23/2022 53  46 - 116 U/L Final   ]

## 2022-02-26 ENCOUNTER — Other Ambulatory Visit: Admit: 2022-02-26 | Discharge: 2022-02-26 | Payer: MEDICARE

## 2022-02-26 ENCOUNTER — Ambulatory Visit: Admit: 2022-02-26 | Discharge: 2022-02-26 | Payer: MEDICARE | Attending: Oncology | Primary: Oncology

## 2022-02-26 ENCOUNTER — Ambulatory Visit: Admit: 2022-02-26 | Discharge: 2022-02-26 | Payer: MEDICARE | Attending: Adult Health | Primary: Adult Health

## 2022-02-26 DIAGNOSIS — D751 Secondary polycythemia: Principal | ICD-10-CM

## 2022-02-26 DIAGNOSIS — D509 Iron deficiency anemia, unspecified: Principal | ICD-10-CM

## 2022-02-26 DIAGNOSIS — R799 Abnormal finding of blood chemistry, unspecified: Principal | ICD-10-CM

## 2022-02-26 DIAGNOSIS — E118 Type 2 diabetes mellitus with unspecified complications: Principal | ICD-10-CM

## 2022-02-26 DIAGNOSIS — E611 Iron deficiency: Principal | ICD-10-CM

## 2022-02-26 LAB — FERRITIN: FERRITIN: 22 ng/mL

## 2022-02-26 LAB — COMPREHENSIVE METABOLIC PANEL
ALBUMIN: 4 g/dL (ref 3.4–5.0)
ALKALINE PHOSPHATASE: 46 U/L (ref 46–116)
ALT (SGPT): 25 U/L (ref 10–49)
ANION GAP: 4 mmol/L — ABNORMAL LOW (ref 5–14)
AST (SGOT): 25 U/L (ref <=34)
BILIRUBIN TOTAL: 0.6 mg/dL (ref 0.3–1.2)
BLOOD UREA NITROGEN: 34 mg/dL — ABNORMAL HIGH (ref 9–23)
BUN / CREAT RATIO: 34
CALCIUM: 10.3 mg/dL (ref 8.7–10.4)
CHLORIDE: 109 mmol/L — ABNORMAL HIGH (ref 98–107)
CO2: 29 mmol/L (ref 20.0–31.0)
CREATININE: 1.01 mg/dL
EGFR CKD-EPI (2021) FEMALE: 55 mL/min/{1.73_m2} — ABNORMAL LOW (ref >=60)
GLUCOSE RANDOM: 124 mg/dL (ref 70–179)
POTASSIUM: 6 mmol/L — ABNORMAL HIGH (ref 3.5–5.1)
PROTEIN TOTAL: 7.3 g/dL (ref 5.7–8.2)
SODIUM: 142 mmol/L (ref 135–145)

## 2022-02-26 LAB — IRON & TIBC
IRON SATURATION: 15 % — ABNORMAL LOW (ref 20–55)
IRON: 61 ug/dL
TOTAL IRON BINDING CAPACITY: 413 ug/dL (ref 250–425)

## 2022-02-26 LAB — CBC
HEMATOCRIT: 57.2 % — ABNORMAL HIGH (ref 34.0–44.0)
HEMOGLOBIN: 18.4 g/dL — ABNORMAL HIGH (ref 11.3–14.9)
MEAN CORPUSCULAR HEMOGLOBIN CONC: 32.1 g/dL (ref 32.0–36.0)
MEAN CORPUSCULAR HEMOGLOBIN: 27.6 pg (ref 25.9–32.4)
MEAN CORPUSCULAR VOLUME: 85.9 fL (ref 77.6–95.7)
MEAN PLATELET VOLUME: 8.4 fL (ref 6.8–10.7)
PLATELET COUNT: 262 10*9/L (ref 150–450)
RED BLOOD CELL COUNT: 6.66 10*12/L — ABNORMAL HIGH (ref 3.95–5.13)
RED CELL DISTRIBUTION WIDTH: 16.4 % — ABNORMAL HIGH (ref 12.2–15.2)
WBC ADJUSTED: 6.3 10*9/L (ref 3.6–11.2)

## 2022-02-26 LAB — MAGNESIUM: MAGNESIUM: 1.7 mg/dL (ref 1.6–2.6)

## 2022-02-26 MED ORDER — HYDROXYUREA 500 MG CAPSULE
ORAL_CAPSULE | ORAL | 3 refills | 85 days | Status: CP
Start: 2022-02-26 — End: ?
  Filled 2022-03-07: qty 44, 28d supply, fill #0

## 2022-02-26 NOTE — Unmapped (Addendum)
Increase your Hydrea to 1 capsule DAILY on Monday, Wednesday, and Friday plus 1 capsule TWICE DAILY on Tuesday, Thursday, Saturday and Sunday.  Stop your multivitamin for now  We will find out about phlebotomy at [facility]  If you have any new symptoms with the increase in Hydrea, please let me know.  Follow up in about 6 weeks        If you have any questions, please do not hesitate to contact us.    **Fevers over 100.4 are an EMERGENCY. Please go to the nearest ER**    If you experience new or worsening of the following symptoms, please call:  1. Night sweats  2. Abdominal discomfort in the left upper quadrant  3. Feeling full early  4. Excessive fatigue  5. Unintentional weight loss  6. Bone pain  7. Itching after hot showers    When reviewing your results, please remember that the results of many of the tests we order can vary somewhat and that variation often means nothing.  Sometimes when we get results back after your clinic visit, if it looks like there???s some variation of that type, we may decide to recheck things sooner than we discussed in clinic.  If you get a call that we want to recheck things sooner, do not panic. It does not mean that things are going wrong.    For appointments & questions Monday through Friday 8 AM-- 5 PM   please call [phone] or Toll free [phone].    On Nights, Weekends and Holidays  Call [phone] and ask for the adult hematologist/oncologist on call.    Markus Jarvis, AGPCNP-C, MSN  Nurse Practitioner  Hematologic Malignancies  [facility]    Nurse Navigator: Pete Glatter, RN  Questions and appointments M-F 8am - 5pm: [phone] or [phone]    [facility]  [address]  [place], [place] [address]  [email or web address]    Results for orders placed or performed in visit on 02/26/22   CBC   Result Value Ref Range    Results Verified by Slide Scan Slide Reviewed     WBC 6.3 3.6 - 11.2 10*9/L    RBC 6.66 (H) 3.95 - 5.13 10*12/L    HGB 18.4 (H) 11.3 - 14.9 g/dL    HCT 36.6 (H) 44.0 - 44.0 %    MCV 85.9 77.6 - 95.7 fL    MCH 27.6 25.9 - 32.4 pg    MCHC 32.1 32.0 - 36.0 g/dL    RDW 34.7 (H) 42.5 - 15.2 %    MPV 8.4 6.8 - 10.7 fL    Platelet 262 150 - 450 10*9/L   Ferritin   Result Value Ref Range    Ferritin 22.0 7.3 - 270.7 ng/mL   IRON PANEL   Result Value Ref Range    Iron 61 50 - 170 ug/dL    TIBC 956 387 - 564 ug/dL    Iron Saturation (%) 15 (L) 20 - 55 %   Comprehensive Metabolic Panel   Result Value Ref Range    Sodium 142 135 - 145 mmol/L    Potassium 6.0 (H) 3.5 - 5.1 mmol/L    Chloride 109 (H) 98 - 107 mmol/L    CO2 29.0 20.0 - 31.0 mmol/L    Anion Gap 4 (L) 5 - 14 mmol/L    BUN 34 (H) 9 - 23 mg/dL    Creatinine 3.32 9.51 - 1.02 mg/dL    BUN/Creatinine Ratio 34  eGFR CKD-EPI (2021) Female 55 (L) >=60 mL/min/1.47m2    Glucose 124 70 - 179 mg/dL    Calcium 16.1 8.7 - 09.6 mg/dL    Albumin 4.0 3.4 - 5.0 g/dL    Total Protein 7.3 5.7 - 8.2 g/dL    Total Bilirubin 0.6 0.3 - 1.2 mg/dL    AST 25 <=04 U/L    ALT 25 10 - 49 U/L    Alkaline Phosphatase 46 46 - 116 U/L

## 2022-02-26 NOTE — Unmapped (Signed)
ID:  Cindy Gonzalez is an 82 y.o. 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  A bone marrow biopsy is not needed to make the diagnosis. She also has an IDH2 and TET2 mutation, conveying worse prognosis.    Cindy. Gonzalez is doing well overall.  She is tolerating HU without any side effects.  She has been on it for about 2 weeks.  There has not been much change in her hmct and it remains high.  She continues to have an increased risk of stroke.  We decided to increase her HU to alternating dosing to expedite the reduction in her hmct to reduce her risk.  She will let us know if she feels poorly after increasing.    Will plan to draw a whole blood potassium at next lab check to see if this is pseudohyperkalemia.  There are no medications that increase her potassium nor is she eating additional potassium.    PLAN:   Continue q2w phlebotomy  Increase HU to 500mg  MWF, 1g Tu/Th/Sa/Su  Continue ASA 81mg  daily  Follow up in 6 weeks    Markus Jarvis, RN, MSN, AGPCNP-C  Nurse Practitioner  Hematologic Malignancies  [facility]  02/26/2022    I personally spent 55 minutes face-to-face and non-face-to-face in the care of this patient, which includes all pre, intra, and post visit time on the date of service.  All documented time was specific to the E/M visit and does not include any procedures that may have been performed.    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    04/2020: 3.2/13.7/41.1/225; ANC: 1.9; ALC: 1.0  02/2021: CTA: Negative; B12: 1,251  04/2021: TTE: EF 35-40%; 5.5/14.6/44.3/268; LDH: 234  10/14/21: 8.0/17.7/55.4/265; AMC: 0.1; ALC: 0.5; K: 5.0  10/19/21: JAK2: 64.9; Epo < 1  11/08/21: 5.6/17.8/55.8/243; K 5.3; LDH: 248  02/06/22: Seen by [facility]EME  7.8/19.5/59.7/262  Smear: No immature forms; nl RBC morphology; smudge cells; reactive lymph   Myeloid mutation panel: IDH2, JAK2, TET2  Ferritin: 27.5  LDH: 399  02/14/22: Started Hydroxyurea 500mg  PO daily  6.5/18.8/58.4/236  02/23/22: Therapeutic phlebotomy   5.9/19.5/60.6/261  02/26/22: 6.3/18.4/57.2/262    INTERVAL HX:  Cindy Gonzalez comes for an opinion about her PCV.  We discussed the following:   PS: No change to this with starting HU  Arthritis: This is her biggest concern from a mobility and QOL standpoint  MSK: has felt a little more achy the past few days  Heme: She denies any bleeding sx; she has no h/o clots  DM: She denies any sx; her BG has been stable  The remainder of her ROS is given below    MPN 10 Score  (App: [email or web address])   [email or web address]  (Useful for monitoring patient symptoms.    Symptom Range Score   Fatigue in the past 24 hours (Absent) 0 1 2 3 4 5 6 7 8 9  10 (Worst Imaginable) 0   Filling up quickly when you eat (Early satiety)  (Absent) 0 1 2 3 4 5 6 7 8 9  10 (  Worst Imaginable) 0   Abdominal discomfort  (Absent) 0 1 2 3 4 5 6 7 8 9  10 (Worst Imaginable) 0   Inactivity (Absent) 0 1 2 3 4 5 6 7 8 9  10 (Worst Imaginable) Uses walking stick; slowed done arthritis 1   Problems with concentration - (Absent) 0 1 2 3 4 5 6 7 8 9  10 (Worst Imaginable) 0   Numbness/ Tingling (in my hands and feet) (Absent) 0 1 2 3 4 5 6 7 8 9  10 (Worst Imaginable) occ in feet 1 - 2   Night sweats (Absent) 0 1 2 3 4 5 6 7 8 9  10 (Worst Imaginable) She notes a little sweatiness with her cholesterol medicine 1   Itching (pruritus) (Absent) 0 1 2 3 4 5 6 7 8 9  10 (Worst Imaginable) 1   Bone pain (diffuse not joint pain or arthritis) (Absent) 0 1 2 3 4 5 6 7 8 9  10 (Worst Imaginable) It is difficult to separate bone from arthritis pain    Fever (>100 F) (Absent) 0 1 2 3 4 5 6 7 8 9  10 (Daily) 0   Unintentional weight loss last 6 months (Absent) 0 1 2 3 4 5 6 7 8 9  10 (Worst Imaginable) With DM meds 16 lbs      PHYSICAL EXAM:  VS: As recorded in Epic  GENERAL: She is in NAD  HEENT: OP is clear; plates  LYMPH NODES:  No LAN  LUNGS: CTA; kyphosis  COR: RRR w/o m/r/g  ABD: NTND; no HSM  EXT: No edema    Primary prophylaxis of thrombosis  All patients: Aspirin unless    VWF activity <30%,   Platelet >?1 million,   CALR-mutated low-risk ET    PV with Hct >?45%: Add phlebotomy/cytoreduction to target Hct <?45%  Age >?60 years and/or prior history of thrombosis: Add cytoreduction  Secondary prophylaxis after thrombotic event  All patients: Cytoreduction  Typical VTE: Consider indefinite VKA for most patients; aspirin if not on VKA  Atypical VTE: Indefinite VKA  Arterial thrombosis: Aspirin    PCV Risk  Age > 60:    1   No: 0   Yes: 1    History of thrombosis:  0   No: 0   Yes: 1    Low Risk: 0 points  High Risk 1 -2 points    Low Risk patients treat with aspirin every day;   High Risk patients treat with aspirin BID*  * Consider BID aspirin for patients with microvascular complications, additional CV risk factors, or leukocytosis        PV:   Major criteria  Hgb/Hct  Men: >?16.5?g/dL or 16% or inc red blood cell mass  Women: > 16 g/dL or 10% or inc red blood cell mass  Inc in RBC Mass > 25%  Presence of JAK2 or JAK2 exon 12 mutation   BM biopsy showing hypercellularity for age with trilineage growth (panmyelosis) including prominent erythroid, granulocytic and megakaryocytic proliferation with pleomorphic, mature megakaryocytes (differences in size, without atypia)     Minor criteria  Subnormal serum erythropoietin level     Diagnosis: All 3 major or first 2 major and 1 minor    Post-PV MF  Required criteria  Previous dx of PCV  BM Fibrosis of grade 2/3    Additional  Anemia or loss of need for cytoreduction  Leukoerythroblastosis  Splenomegaly > 5 cm from baseline or new splenomegaly  2 or more constitutional  sx: > 10% wt loss in 6 mths, NS, fever > 37.5

## 2022-03-01 NOTE — Unmapped (Signed)
Medicare Annual Wellness Visit    Risks identified- falls risk with  No recent falls reported.      End of Life Care Planning  Advance Directives-  DNR/DNI    Personalized Prevention Plan  During the course of the visit the patient was educated and counseled about appropriate screening and preventive services.  A personalized prevention plan was reviewed with the patient .    Health Maintenance-  Mammogram-2021; pt referred  BDS-2021/pt is referred  CRS: 2017 with recommendation for repeat in 3 years: Deferred as per guidelines/age  Td vaccine-2019  Pneumovax-2020  ShingRix- recommended and pt advised to get at local pharmacy  Flu vaccine-10/2021  Eye exam- up to date  Covid 19 vaccine-series completed and boosted 2021; booster recommended  RSV-  at local pharmacy      PMH/HPI  I last saw the patient in October/2023 for the following medical conditions:    1.  Essential hypertension: The patient has been counseled on maintaining low-salt dietary salt intake, home blood pressure monitoring and exercise routine. Her home BP values average 140/70.  It is somewhat elevated today but pt has not taken her morning medications yet.  She continues on lisinopril, amlodipine, spironolactone, metoprolol and furosemide as directed. She is followed by cardiology. Spironolactone was more recently added to her regimen by cardiology. CMP done 02/2022 revealed normal LFT's, serum electrolytes and creatinine level .    2.  Essential tremor: Patient has been on lorazepam for this condition but she takes 1/2 a tab bid prn only.    She has been on propranolol in the past and it resulted in excessive sedation and stopped.  She wishes to continue lorazepam since she takes it prn and it helps with insomnia and GAD as well. Primidone can result in sedation as well therefore defer this option for now.  TSH was normal in 10/2020.     3.  T2DM: Patient has been counseled on maintaining carbohydrate/sugar restricted diet.  HgA1c was increased to 8.4 in October/2023.  I recommended increasing metformin to 500 mg in the a.m. and an half a tab in the p.m.  Cardiologist recommended she start Jardiance 10 mg daily but pt decided not to take this medication due to potential side effects.    She had normal serum creatinine level of 1.0 in 02/2022. She had a diabetic eye exam in 01/2021 .  Repeat A1c today recommended.    4.  Dyslipidemia: Patient has been counseled maintaining low-fat diet and statin and tricor regimen as previously directed.  She had normal LFTs in 02/2022 and lipid panel from 06/2021 revealed low HDL of 35  and LDL of  57.      5.  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 CBC done at that time with H/H of 18/57 with normal white blood cell count, platelet count and MCV.  Iron and ferritin level were low normal at that time. She was advised to stop iron supplement . She is on Vit B 12 supplement. She was started on Hydrea 500 mg M, W, F and 1000 mg on all other days.  She is having her first therapeutic phlebotomy this week.       6. BMI of 34:  She is mindful of her diet and she has limited exercise. She has no history of thyroid disease.  Her weight remains stable.  7..  CAD history and chronic heart failure with preserved EF: Patient followed by cardiology.  Last encounter was yesterday and details reviewed with the pt today.  No med changes were made and she will follow up in 6 months time.   She continues on home oxygen.   She has been using it if her home O2 saturation is below 88 and or SOB.  She has been using it primarily at night but not often.  Patient had recent Zio patch early this year and follow up  echocardiogram in 08/2021 which revealed improved LVEF of 55-60%.   O2 sat today is 95% off oxygen.       8.  OA- pt take Mobic daily.  She is advised to take Maximum strength tylenol, 650 mg tab, 2 tablets up to 3 times a day.  She takes Meloxicam as needed only for break through pain but had been taking it daily. .   In addition, she has tramadol she takes prn.  She has been seen by [facility] orthopaedics and she has more recent issues with her left 5th finger.   She is unable to flex this finger without history of injury.     9. History of skin cancer: She was seen by local dermatologist early this yr and diagnosed with basal cell CA.  Lesion was excised and she was advised to follow up with dermatology prn.  She reports no new worrisome skin lesions.     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  She  was started on  recommend estrace vaginal cream to use as directed. This has helped with her symptoms. She is using cream prn and she is having no vaginal complaints today.    11. History of constipation:  KUB xray done in 10/2020 revealed moderate stool burden.   In addition to increasing daily fluid intake, I recommended generic OTC metamucil, Citrucel or Miralax.  She has not needed to take any otc remedies since she has maintained a diet rich with fruits and vegetables and she is having a daily normal  BM.  She has occasional heartburn which responds to OTC TUMs.or Nexium .    12. Vertigo complaint: pt was seen earlier this year with vertigo. Possible BPPV.  Patient reported resolution of symptoms.   She reports no recent falls.  Falls assessment done.       13.  Patient was seen at [facility] ED on 10/14/2021 with pneumonia of right lower lobe.  She presented with shortness of breath and cough.  She was given Vantin 200 mg once and discharged on Zithromax to take as directed. Pneumonia symptoms started after she was started on prednisone for an allergic reaction to an insect bite.  She has had resolution of SOB and cough.  She has an albuterol inhaler that she uses prn for allergy related cough.               Orders Placed This Encounter   Procedures    Mammo Digital Screening Bilateral     [place] location     Standing Status:   Future     Standing Expiration Date:   03/06/2023     Order Specific Question:   REASON FOR EXAM     Answer:   SCREENING MAMMOGRAM     Order Specific Question:   Performed at     Answer:   [place]    Dexa Bone Density Skeletal     [place] location     Standing Status:  Future     Standing Expiration Date:   03/06/2023     Order Specific Question:   Performed at     Answer:   [facility]     Order Specific Question:   Reason for Exam:     Answer:   screening    Hemoglobin A1c    Hemoglobin A1c    TSH    Lipid Panel     Order Specific Question:   Fasting required for collection?     Answer:   No       Subjective:     ABBEE DOWDEN is a 82 y.o. female who presents for a Medicare Wellness Visit.      Medicare eligibility date:  82 yr old  Type of visit: AWV, subsequent    Health Risk Assessment:  The patient's Health Risk Assessment forms were completed/reviewed.      Current Opioid use: None    Comprehensive Medical History  Patient Active Problem List   Diagnosis    Pancreatitis    Hepatitis    Hypertension    Coronary artery disease    Atelectasis    Heart failure with mid-range ejection fraction (CMS-HCC)    Hypoxemia    Essential tremor    Hyponatremia    Acute disorder of liver    NICM (nonischemic cardiomyopathy) (CMS-HCC)    Extranodal marginal zone B-cell lymphoma (CMS-HCC)    Leukopenia    Type 2 diabetes with complication (CMS-HCC)    Dyslipidemia    History of lymphoma    Arthritis    Seborrheic dermatitis    Insomnia    Obesity    Osteoarthritis    Mild aortic stenosis    Dryness of vagina    Skin carcinoma    Vertigo    History of pneumonia    History of constipation    Erythrocytosis    Iron deficiency anemia    Iron deficiency     Past Medical History:   Diagnosis Date    At risk for falls     left ankle arthritis; cane     Coronary artery disease     Diabetes mellitus (CMS-HCC)     Diffuse large B cell lymphoma (CMS-HCC)     Hypertension     Impaired mobility     left ankle arthritis, cane    Visual impairment     glasses     Past Surgical History:   Procedure Laterality Date    CHOLECYSTECTOMY      HYSTERECTOMY       Family History   Problem Relation Age of Onset    Cancer Other     Diabetes Mother     Asthma Mother     Hypertension Father     Stroke Father      Allergies   Allergen Reactions    Amoxicillin      Other Reaction(s): Unknown    Cephalexin Palpitations    Ciprofloxacin Palpitations     Legacy System: [place]    Onset Date: <blank>    Substance Legacy/Cerner: Cipro / Cipro (Legacy value)    Category: Drug    Severity Legacy/Cerner: <blank> / Unknown    Reaction(s): makes head feel funny  nausea    Comments: <blank>    Hydrocodone-Acetaminophen Nausea And Vomiting and Other (See Comments)    Sulfamethoxazole Palpitations     Current Outpatient Medications   Medication Sig Dispense Refill    albuterol HFA 90 mcg/actuation inhaler Inhale 2 puffs  every six (6) hours as needed for wheezing. 18 g 0    amlodipine (NORVASC) 10 MG tablet Take 1 tablet (10 mg total) by mouth daily. 90 tablet 3    aspirin (ECOTRIN) 81 MG tablet Take 1 tablet (81 mg total) by mouth daily. 30 tablet 11    atorvastatin (LIPITOR) 10 MG tablet Take 1 tablet (10 mg total) by mouth daily. 90 tablet 3    betamethasone dipropionate 0.05 % lotion Apply topically Two (2) times a day. 60 mL 1    blood sugar diagnostic (ACCU-CHEK GUIDE TEST STRIPS) Strp by Other route Two (2) times a day (30 minutes before a meal). 200 each 1    calcium-vitamin D 500 mg(1,250mg ) -200 unit per tablet Take 1 tablet by mouth in the morning and 1 tablet in the evening. Take with meals.      cyanocobalamin 2000 MCG tablet Take 1 tablet (2,000 mcg total) by mouth daily.      fenofibrate (TRICOR) 145 MG tablet Take 1 tablet (145 mg total) by mouth daily. 90 tablet 3    fluocinolone acetonide oil 0.01 % Drop Administer 1 drop into the left ear two (2) times a day. (Patient taking differently: Administer 1 drop into the left ear two (2) times a day. prn) 20 mL 0    fluticasone propionate (FLONASE) 50 mcg/actuation nasal spray 1 spray into each nostril daily. 16 g 11    furosemide (LASIX) 20 MG tablet TAKE 1 TABLET BY MOUTH THREE TIMES A WEEK 36 tablet 0    ketoconazole (NIZORAL) 2 % cream APPLY  CREAM TOPICALLY ONCE DAILY FOR SCALP RASH 30 g 0    lancets (ACCU-CHEK SOFTCLIX LANCETS) Misc Use as directed to check glucose twice per day 200 each 5    lisinopril (PRINIVIL,ZESTRIL) 30 MG tablet Take 1 tablet (30 mg total) by mouth daily. 90 tablet 3    LORazepam (ATIVAN) 0.5 MG tablet TAKE 1/2 (ONE-HALF) TABLET BY MOUTH TWICE DAILY AS NEEDED FOR ANXIETY 30 tablet 0    meloxicam (MOBIC) 7.5 MG tablet Take 1 tablet (7.5 mg total) by mouth daily. 90 tablet 1    metFORMIN (GLUCOPHAGE) 500 MG tablet 1 tablet in the AM and 1/2 tab with dinner daily 180 tablet 3    metoPROLOL succinate (TOPROL-XL) 50 MG 24 hr tablet TAKE 1 TABLET BY MOUTH AT BEDTIME 90 tablet 3    mupirocin (BACTROBAN) 2 % ointment Apply 1 Application topically Two (2) times a day (at 8am and 12:00).      RESTASIS 0.05 % ophthalmic emulsion INSTILL 1 DROP INTO EACH EYE TWICE DAILY      spironolactone (ALDACTONE) 25 MG tablet Take 1 tablet (25 mg total) by mouth daily. 90 tablet 3    traMADoL (ULTRAM) 50 mg tablet Take 1 tablet (50 mg total) by mouth every six (6) hours as needed for pain. 30 tablet 0    blood-glucose meter kit Use as instructed 1 each 0    estradiol (ESTRACE) 0.01 % (0.1 mg/gram) vaginal cream Insert 2 g into the vagina daily. Use externally prn 42.5 g 11    fluticasone propionate (FLONASE) 50 mcg/actuation nasal spray 1 spray into each nostril daily. (Patient not taking: Reported on 03/06/2022) 16 g 11    hydroxyurea (HYDREA) 500 mg capsule Take 1 capsule (500 mg total) by mouth Every Monday, Wednesday, and Friday AND 2 capsules (1,000 mg total) Every Tuesday, Thursday, Saturday, Sunday. (Patient not taking: Reported on 03/06/2022) 132 capsule 3  lancing device Misc USE TO CHECK BLOOD SUGAR 2 TIMES A DAY 100 each 2     No current facility-administered medications for this visit.       Hospitalizations:  none    Current Providers:   Patient Care Team:  Jenell Milliner, MD as PCP - General (Family Medicine)  Jenell Milliner, MD as PCP - Guinevere Scarlet, Lanny Hurst, MD (Cardiovascular Disease)  Dittus, Providence Crosby, MD as Consulting Physician (Hematology and Oncology)  Ramm, Erskin Burnet as Nurse Practitioner (Cardiology)    Other Specialists, Providers, Medical Suppliers:  [facility] cardiology  [facility] hematology    Social History:   Occupation:  retired   Marital Status:  widow   Lives with:  alone   Diet:  heart health   Physical Activity:  she does all of her housework although her mobility is limited  Social History     Socioeconomic History    Marital status: Widowed     Spouse name: None    Number of children: None    Years of education: None    Highest education level: None   Tobacco Use    Smoking status: Never    Smokeless tobacco: Never   Vaping Use    Vaping status: Never Used   Substance and Sexual Activity    Alcohol use: Not Currently    Drug use: Never    Sexual activity: Not Currently     Social Determinants of Health     Financial Resource Strain: Low Risk  (02/14/2021)    Overall Financial Resource Strain (CARDIA)     Difficulty of Paying Living Expenses: Not hard at all   Food Insecurity: No Food Insecurity (02/14/2021)    Hunger Vital Sign     Worried About Running Out of Food in the Last Year: Never true     Ran Out of Food in the Last Year: Never true   Transportation Needs: No Transportation Needs (02/14/2021)    PRAPARE - Transportation     Lack of Transportation (Medical): No     Lack of Transportation (Non-Medical): No   Stress: No Stress Concern Present (10/16/2021)    [employer] of Occupational Health - Occupational Stress Questionnaire     Feeling of Stress : Not at all       Preventive Care:  Health Maintenance   Topic Date Due    Zoster Vaccines (1 of 2) Never done    COVID-19 Vaccine (4 - 2023-24 season) 09/01/2021    Hemoglobin A1c  01/31/2022    Retinal Eye Exam  03/02/2022    Foot Exam  11/01/2022    Urine Albumin/Creatinine Ratio  11/01/2022    Serum Creatinine Monitoring  02/27/2023    Potassium Monitoring  02/27/2023    Medicare Annual Wellness Visit (AWV)  04/05/2023    DEXA Scan  04/01/2024    DTaP/Tdap/Td Vaccines (2 - Td or Tdap) 03/01/2027    Pneumococcal Vaccine 65+  Completed    Influenza Vaccine  Completed     Immunization History   Administered Date(s) Administered    COVID-19 VACC,MRNA,(PFIZER)(PF) 02/09/2019, 03/02/2019, 11/12/2019    INFLUENZA INJ MDCK PF, QUAD,(FLUCELVAX)(41MO AND UP EGG FREE) 10/01/2019, 10/25/2020    INFLUENZA QUAD ADJUVANTED 2YR UP(FLUAD) 10/05/2021    PNEUMOCOCCAL POLYSACCHARIDE 23-VALENT 10/02/2010, 05/07/2018    Pneumococcal Conjugate 13-Valent 10/29/2013    TD, ADSORBED, 5LF TETANUS TOXOID, PF(TENIVAC/DECAVAC) 02/28/2017       Depression Screen:  1.  Over the past two weeks, have you  felt down, depressed or hopeless?  no  2.  Over the past two weeks, have you felt little interest or pleasure in doing things?  no    Safety Screen:  1.  Do you need help with the phone, transportation, shopping, preparing meals, housework, laundry, medications, or managing money?  no  2.  Does your home have rugs in the hallway, lack grab bars in the bathroom, lack handrails on the stairs, or have poor lighting?  no       Objective:     Blood pressure 122/76, pulse 67, temperature 36 ??C (96.8 ??F), height 153.2 cm (5' 0.32), weight 76.5 kg (168 lb 9.6 oz), SpO2 95%, not currently breastfeeding.  Body mass index is 32.58 kg/m??.    Functional Ability:  Mobility Test : limited   Hearing Assessment: normal to finger rub testing  Memory Assessment: normal clock drawing with time and able to recall 3 words      Physical Exam:  Neck exam- no masses  Lung exam- clear to auscultation  Breast exam- no masses palpated  Cardiac exam- RRR nl s1 and s2  abd exam- no HSM, non tender, no masses  Ext exam- no edema    Assessment and Plan-  AWV  Screening labs-the patient had recent comprehensive lab work including CBC, CMP, magnesium and iron levels.  SHe is due for A1c, annual TSH and lipid panel today.  Referrals- mammogram and BDS  Vaccines- RSV, Covid 19 booster and Shingrix recommended  Follow up- 3 months.

## 2022-03-01 NOTE — Unmapped (Signed)
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    Current hydroxyurea dose: 500 mg daily    A/P:   -hematocrit of 57.2% not within target goal of < 45%   -Adjust hydroxyurea dose to 1000 mg/day (500 mg twice daily) on tue, thu,sat, sun, 500 mg/day on mon, wed, fri  Target: anc > 2.0, hematocrit < 45%    Counseled patient on:  -hydroxyurea dose and administration    Cindy Gonzalez verbalized understanding of the treatment plan provided and had no further questions.       F/u: 04/10/22  ____________________________________________________________________________________________________________    Interim history:  Patient reported following:  -takes hydroxyurea after food  -tolerating hydroxyurea with no side effects  -bug bite much better    Medication Access  Insurance: [employer] medicare      Labs:   Office Visit on 02/26/2022   Component Date Value Ref Range Status    Magnesium 02/26/2022 1.7  1.6 - 2.6 mg/dL Final   Lab on 08/65/7846   Component Date Value Ref Range Status    Results Verified by Slide Scan 02/26/2022 Slide Reviewed   Final    WBC 02/26/2022 6.3  3.6 - 11.2 10*9/L Final    RBC 02/26/2022 6.66 (H)  3.95 - 5.13 10*12/L Final    HGB 02/26/2022 18.4 (H)  11.3 - 14.9 g/dL Final    HCT 96/29/5284 57.2 (H)  34.0 - 44.0 % Final    MCV 02/26/2022 85.9  77.6 - 95.7 fL Final    MCH 02/26/2022 27.6  25.9 - 32.4 pg Final    MCHC 02/26/2022 32.1  32.0 - 36.0 g/dL Final    RDW 13/24/4010 16.4 (H)  12.2 - 15.2 % Final    MPV 02/26/2022 8.4  6.8 - 10.7 fL Final    Platelet 02/26/2022 262  150 - 450 10*9/L Final    Ferritin 02/26/2022 22.0  7.3 - 270.7 ng/mL Final    Iron 02/26/2022 61  50 - 170 ug/dL Final    TIBC 27/25/3664 413  250 - 425 ug/dL Final    Iron Saturation (%) 02/26/2022 15 (L)  20 - 55 % Final    Sodium 02/26/2022 142  135 - 145 mmol/L Final    Potassium 02/26/2022 6.0 (H)  3.5 - 5.1 mmol/L Final    Chloride 02/26/2022 109 (H)  98 - 107 mmol/L Final    CO2 02/26/2022 29.0  20.0 - 31.0 mmol/L Final    Anion Gap 02/26/2022 4 (L)  5 - 14 mmol/L Final    BUN 02/26/2022 34 (H)  9 - 23 mg/dL Final    Creatinine 40/34/7425 1.01  0.55 - 1.02 mg/dL Final    BUN/Creatinine Ratio 02/26/2022 34   Final    eGFR CKD-EPI (2021) Female 02/26/2022 55 (L)  >=60 mL/min/1.4m2 Final    eGFR calculated with CKD-EPI 2021 equation in accordance with [employer] and [employer] of Nephrology Task Force recommendations.    Glucose 02/26/2022 124  70 - 179 mg/dL Final    Calcium 95/63/8756 10.3  8.7 - 10.4 mg/dL Final    Albumin 43/32/9518 4.0  3.4 - 5.0 g/dL Final    Total Protein 02/26/2022 7.3  5.7 - 8.2 g/dL Final    Total Bilirubin 02/26/2022 0.6  0.3 - 1.2 mg/dL Final    AST  02/26/2022 25  <=34 U/L Final    ALT 02/26/2022 25  10 - 49 U/L Final    Alkaline Phosphatase 02/26/2022 46  46 - 116 U/L Final       Approximate face time spent with patient: 10 minutes     Audie Box, PharmD, BCOP, CPP  Clinical Pharmacist Practitioner, Benign Hematology

## 2022-03-01 NOTE — Unmapped (Signed)
DIVISION OF CARDIOLOGY   [facility] [employer],   [place]                                                                         Date of Service:  03/05/2022     ASSESSMENT and PLAN:   1. Coronary artery disease involving native coronary artery of native heart without angina pectoris  Stable nonobstructive coronary disease with LDL in June which was appropriately controlled.  Remains on aspirin for secondary prevention.    2. 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  Recommended continue utilization twice daily Lasix along with spironolactone.  May consider addition of Jardiance in the future.    3. Primary hypertension  Borderline control of her blood pressures.  Have recommended following them at home and if they are consistently above 130 systolic then and notify our clinic.  - lisinopril (PRINIVIL,ZESTRIL) 30 MG tablet; Take 1 tablet (30 mg total) by mouth daily.  Dispense: 90 tablet; Refill: 3      Return to clinic:  Return in about 6 months (around 09/05/2022).    SUBJECTIVE:     Chief complaint: Cindy Gonzalez is a 82 y.o. female with a past medical history of HFpEF, HTN, mild LV dysfunction seen for routine follow-up of coronary artery disease HFpEF and hypertension.    Per chart review, the patient is well established in my clinic. She was seen in the last year at the ED for fluid overload and discharged on 20 mg Lasix (See ED note for more detail). Despite re-challenging with Jardiance she continued to feel dizzy, nauseated, and with frequent headaches. Echo showed LVEF 35-40%, moderate reduced RV systolic function.    At last visit: No changes were made to her treatment plan.     History of present illness:  Today Cindy Gonzalez presents to the clinic that overall she has been doing very well.  She denies chest pain, PND, orthopnea or lower extreme edema.  She reports that she is taking Lasix once or twice a week if she has some swelling in her ankles or notices her weight going up on her scale but overall symptoms been well-controlled.  She reports blood pressures have been in the 130s primarily.  She denies chest pain, blood in her urine or blood in her stool.    Review of Systems  10 systems were reviewed and negative except as noted in HPI.    Past Medical History:   Diagnosis Date    At risk for falls     left ankle arthritis; cane     Coronary artery disease     Diabetes mellitus (CMS-HCC)     Diffuse large B cell lymphoma (CMS-HCC)     Hypertension     Impaired mobility     left ankle arthritis, cane    Visual impairment     glasses       Past Surgical History:   Procedure Laterality Date    CHOLECYSTECTOMY      HYSTERECTOMY         Current Outpatient Medications   Medication Instructions    albuterol HFA 90 mcg/actuation inhaler 2 puffs, Inhalation, Every 6 hours  PRN    amlodipine (NORVASC) 10 mg, Oral, Daily (standard)    aspirin (ECOTRIN) 81 mg, Oral, Daily (standard)    atorvastatin (LIPITOR) 10 mg, Oral, Daily (standard)    blood sugar diagnostic (ACCU-CHEK GUIDE TEST STRIPS) Strp Other, 2 times a day (AC)    blood-glucose meter kit Use as instructed    calcium-vitamin D 500 mg(1,250mg ) -200 unit per tablet 1 tablet, Oral, 2 times a day with meals    cyanocobalamin 2,000 mcg, Oral, Daily (standard)    estradiol (ESTRACE) 2 g, Vaginal, Daily (standard), Use externally prn    fenofibrate (TRICOR) 145 mg, Oral, Daily (standard)    fluocinolone acetonide oil 0.01 % Drop 1 drop, Left Ear, 2 times a day (standard)    fluticasone propionate (FLONASE) 50 mcg/actuation nasal spray 1 spray, Each Nare, Daily (standard)    fluticasone propionate (FLONASE) 50 mcg/actuation nasal spray 1 spray, Each Nare, Daily (standard)    furosemide (LASIX) 20 MG tablet TAKE 1 TABLET BY MOUTH THREE TIMES A WEEK    hydroxyurea (HYDREA) 500 mg capsule Take 1 capsule (500 mg total) by mouth Every Monday, Wednesday, and Friday AND 2 capsules (1,000 mg total) Every Tuesday, Thursday, Saturday, Sunday.    ketoconazole (NIZORAL) 2 % cream APPLY  CREAM TOPICALLY ONCE DAILY FOR SCALP RASH    lancets (ACCU-CHEK SOFTCLIX LANCETS) Misc Use as directed to check glucose twice per day    lancing device Misc USE TO CHECK BLOOD SUGAR 2 TIMES A DAY    lisinopril (PRINIVIL,ZESTRIL) 30 mg, Oral, Daily (standard)    LORazepam (ATIVAN) 0.5 MG tablet TAKE 1/2 (ONE-HALF) TABLET BY MOUTH TWICE DAILY AS NEEDED FOR ANXIETY    meloxicam (MOBIC) 7.5 mg, Oral, Daily (standard)    metFORMIN (GLUCOPHAGE) 500 MG tablet 1 tablet in the AM and 1/2 tab with dinner daily    metoPROLOL succinate (TOPROL-XL) 50 mg, Oral, Nightly    mupirocin (BACTROBAN) 2 % ointment 1 Application, Topical, 2 times a day    RESTASIS 0.05 % ophthalmic emulsion INSTILL 1 DROP INTO EACH EYE TWICE DAILY    spironolactone (ALDACTONE) 25 mg, Oral, Daily (standard)    traMADol (ULTRAM) 50 mg, Oral, Every 6 hours PRN      Allergies:  is allergic to amoxicillin, cephalexin, ciprofloxacin, hydrocodone-acetaminophen, and sulfamethoxazole.    Social History:  She  reports that she has never smoked. She has never used smokeless tobacco. She reports that she does not currently use alcohol. She reports that she does not use drugs.    Family History:  Her family history includes Asthma in her mother; Cancer in an other family member; Diabetes in her mother; Hypertension in her father; Stroke in her father.    OBJECTIVE:      BP 136/65  - Pulse 83  - Resp 20  - Ht 152.4 cm (5')  - Wt 77.1 kg (170 lb)  - LMP  (LMP Unknown)  - SpO2 97%  - BMI 33.20 kg/m??    Wt Readings from Last 3 Encounters:   03/09/22 78.1 kg (172 lb 2.9 oz)   03/06/22 76.5 kg (168 lb 9.6 oz)   03/05/22 77.1 kg (170 lb)     BP Readings from Last 5 Encounters:   03/09/22 144/64   03/06/22 122/76   03/05/22 136/65   02/26/22 134/70   02/23/22 129/62     General-  Normal appearing female in no apparent distress.  Neurologic- Alert and oriented X3.  Cranial nerve II-XII grossly intact.  HEENT-  Normocephalic atraumatic head.  No scleral icterus.  Wearing face mask.  Neck- Supple, no carotid bruis, jugular venous pulsation 7 cm water.  Lungs- Clear to auscultation, no wheezes, rhonchi, or rhales.  Heart-regular rhythm and rate without murmur rub or gallop.  Abdomen- Soft, nontender, no organomegally.  Extremities-  No clubbing or cyanosis.  No pitting edema to lower extremities bilaterally  Pulses-2+ pulses in radial and dorsalis pedis bilaterally.  Psych- Normal mood, appropriate.     Lab Results   Component Value Date    HGB 17.8 (H) 03/09/2022    HGB 18.4 (H) 02/26/2022    HGB 19.5 (H) 02/23/2022    PLT 153 03/09/2022    PLT 262 02/26/2022    PLT 261 02/23/2022     Lab Results   Component Value Date    CREATININE 0.86 03/09/2022    CREATININE 1.01 02/26/2022    CREATININE 1.00 02/23/2022    K 4.8 03/09/2022    K 6.0 (H) 02/26/2022    K 5.6 (H) 02/23/2022      Lab Results   Component Value Date    BNP 54.86 10/14/2021    BNP 39.19 02/14/2021    BNP 53.50 07/26/2019     Lab Results   Component Value Date    PROBNP 207.0 10/16/2021     Lab Results   Component Value Date    CHOL 143 03/06/2022    CHOL 134 06/27/2021    LDL 69 03/06/2022    LDL 57 06/27/2021    HDL 39 (L) 03/06/2022    HDL 35 (L) 06/27/2021    TRIG 177 (H) 03/06/2022    TRIG 209 (H) 06/27/2021     Lab Results   Component Value Date    A1C 7.2 (H) 03/06/2022     Electrocardiogram:  From 10/14/21 showed: 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 Left axis deviation. LBBB.     From 02/14/21 showed: SR with 1st degree AV block, left axis deviation, LBBB.     From 03/04/20 showed: sinus tach 114bpm, left axis deviation, LBBB (previously IVCD).     From 07/20/19 showed: SR, 1st degree AV block w/ occasional premature ventricular beats, left axis deviation, intraventricular conduction delay, possible anterolateral infarct.    Echocardiogram:  TTE From 08/16/21 showed: The left ventricle is normal in size with upper normal wall thickness. The left ventricular systolic function is normal, LVEF is visually estimated at 55-60%. Mitral annular calcification is present (moderate). The right ventricle is normal in size, with normal systolic function. There is no RV thrombus.    From 04/12/21 showed: LVEF 35-40%. Mitral annular calcification is present (moderate). The aortic valve is trileaflet with mildly thickened leaflets with mildly reduced excursion. The right ventricle is normal in size, with moderately reduced systolic function. There is an echo density within the RV apex that likely represents trabeculation but thrombus or mass can not be excluded.  Recommend clinical correlation and if appropriate, complimentary diagnostic testing.     From 07/21/19 showed: LV normal in size w/ mildly increased wall thickness, LVEF 45%. Mild mitral annular calcification present. Mild aortic valve stenosis. RV normal in size, w/ normal systolic function.    Ziopatch:  From 2/21-3/07/23 showed: patient had a min HR of 50 bpm, max HR of 154 bpm, and avg HR of 80 bpm. First Degree AV Block was present. Bundle Branch Block/IVCD was present. Predominant underlying rhythm was Sinus Rhythm. 4 Supraventricular Tachycardia runs occurred, the run with the  fastest interval lasting 8 beats with a max rate of 154 bpm (avg 128 bpm); the run with the fastest interval was also the longest. Isolated SVEs were rare (<1.0%), SVE Couplets were rare (<1.0%), and SVE Triplets were rare (<1.0%). Isolated VEs were rare (<1.0%), VE Couplets were rare (<1.0%), and no VE Triplets were present. Ventricular Bigeminy and Trigeminy were present.  Symptoms associated with isolated ventricular ectopic beats.     CTA chest:  From 02/14/21 showed: cardiac chambers normal in size.    Ascending and descending aorta normal in caliber.    There is no pericardial effusion. Coronary calcification.     Cardiac Catheterization:  From 2014 showed: 30% mLAD, calcification LMCA.    Lipid panel:  Component Latest Ref Rng 06/27/2021   Triglycerides      0 - 150 mg/dL 284 (H)    Cholesterol      <=200 mg/dL 132    HDL      40 - 60 mg/dL 35 (L)    LDL calculated      40 - 99 mg/dL 57    VLDL Cholesterol Cal      11 - 41 mg/dL 44.0 (H)    Chol/HDL Ratio      1.0 - 4.5  3.8    Non-HDL Cholesterol      70 - 102 mg/dL 99    FASTING Unknown       Documentation assistance was provided by Mirian Capuchin, Scribe on March 05, 2022 3:43 PM  for Joneen Roach, MD.     I have reviewed the documentation provided by the scribe and confirm that it accurately reflects the service I personally performed and the decisions made by me.  Signature: CSD  Date: 03/11/22   Time: 15:43

## 2022-03-04 NOTE — Unmapped (Signed)
Clinical Assessment Needed For: Dose Change  Medication: Hydroxyurea  Last Fill Date/Day Supply: 02/13/22 / 30  Copay $0  Was previous dose already scheduled to fill: No    Notes to Pharmacist: None

## 2022-03-05 ENCOUNTER — Ambulatory Visit: Admit: 2022-03-05 | Discharge: 2022-03-06 | Payer: MEDICARE

## 2022-03-05 MED ORDER — LISINOPRIL 30 MG TABLET
ORAL_TABLET | Freq: Every day | ORAL | 3 refills | 90 days | Status: CP
Start: 2022-03-05 — End: 2023-03-05

## 2022-03-05 NOTE — Unmapped (Signed)
[facility] Clinical Assessment & Refill Coordination Note    Cindy Gonzalez, DOB: May 11, 1940  Phone: [phone] (home)     All above HIPAA information was verified with patient.     Was a Nurse, learning disability used for this call? No    Specialty Medication(s):   Hematology/Oncology: Hydroxyurea     Current Outpatient Medications   Medication Sig Dispense Refill    albuterol HFA 90 mcg/actuation inhaler Inhale 2 puffs every six (6) hours as needed for wheezing. 18 g 0    amlodipine (NORVASC) 10 MG tablet Take 1 tablet (10 mg total) by mouth daily. 90 tablet 3    aspirin (ECOTRIN) 81 MG tablet Take 1 tablet (81 mg total) by mouth daily. 30 tablet 11    atorvastatin (LIPITOR) 10 MG tablet Take 1 tablet (10 mg total) by mouth daily. 90 tablet 3    blood sugar diagnostic (ACCU-CHEK GUIDE TEST STRIPS) Strp by Other route Two (2) times a day (30 minutes before a meal). 200 each 1    blood-glucose meter kit Use as instructed 1 each 0    calcium-vitamin D 500 mg(1,250mg ) -200 unit per tablet Take 1 tablet by mouth in the morning and 1 tablet in the evening. Take with meals.      cyanocobalamin 2000 MCG tablet Take 1 tablet (2,000 mcg total) by mouth daily.      estradioL (ESTRACE) 0.01 % (0.1 mg/gram) vaginal cream Insert 2 g into the vagina daily. Use externally prn      fenofibrate (TRICOR) 145 MG tablet Take 1 tablet (145 mg total) by mouth daily. 90 tablet 3    fluocinolone acetonide oil 0.01 % Drop Administer 1 drop into the left ear two (2) times a day. 20 mL 0    fluticasone propionate (FLONASE) 50 mcg/actuation nasal spray 1 spray into each nostril daily. 16 g 11    fluticasone propionate (FLONASE) 50 mcg/actuation nasal spray 1 spray into each nostril daily. 16 g 11    furosemide (LASIX) 20 MG tablet TAKE 1 TABLET BY MOUTH THREE TIMES A WEEK 36 tablet 0    guaiFENesin (MUCINEX) 600 mg 12 hr tablet Take 1 tablet (600 mg total) by mouth two (2) times a day as needed for congestion.  0 hydroxyurea (HYDREA) 500 mg capsule Take 1 capsule (500 mg total) by mouth Every Monday, Wednesday, and Friday AND 2 capsules (1,000 mg total) Every Tuesday, Thursday, Saturday, Sunday. 132 capsule 3    ketoconazole (NIZORAL) 2 % cream APPLY  CREAM TOPICALLY ONCE DAILY FOR SCALP RASH 30 g 0    lancets (ACCU-CHEK SOFTCLIX LANCETS) Misc Use as directed to check glucose twice per day 200 each 5    lancing device Misc USE TO CHECK BLOOD SUGAR 2 TIMES A DAY 100 each 2    lisinopriL (PRINIVIL,ZESTRIL) 30 MG tablet Take 1 tablet (30 mg total) by mouth daily. 90 tablet 3    LORazepam (ATIVAN) 0.5 MG tablet TAKE 1/2 (ONE-HALF) TABLET BY MOUTH TWICE DAILY AS NEEDED FOR ANXIETY 30 tablet 0    meloxicam (MOBIC) 7.5 MG tablet Take 1 tablet (7.5 mg total) by mouth daily. 90 tablet 1    metFORMIN (GLUCOPHAGE) 500 MG tablet 1 tablet in the AM and 1/2 tab with dinner daily 180 tablet 3    metoPROLOL succinate (TOPROL-XL) 50 MG 24 hr tablet TAKE 1 TABLET BY MOUTH AT BEDTIME 90 tablet 3    mupirocin (BACTROBAN) 2 % ointment Apply 1 Application  topically Two (2) times a day (at 8am and 12:00).      RESTASIS 0.05 % ophthalmic emulsion INSTILL 1 DROP INTO EACH EYE TWICE DAILY      spironolactone (ALDACTONE) 25 MG tablet Take 1 tablet (25 mg total) by mouth daily. 90 tablet 3    traMADoL (ULTRAM) 50 mg tablet Take 1 tablet (50 mg total) by mouth every six (6) hours as needed for pain. 30 tablet 0     No current facility-administered medications for this visit.        Changes to medications: Cindy Gonzalez Reports stopping the following medications: multivitamin    Allergies   Allergen Reactions    Amoxicillin      Other Reaction(s): Unknown    Cephalexin Palpitations    Ciprofloxacin Palpitations       Legacy System: Abran Cantor  Onset Date: <blank>  Substance Legacy/Cerner: Cipro / Cipro (Legacy value)  Category: Drug  Severity Legacy/Cerner: <blank> / Unknown  Reaction(s): makes head feel funny  nausea  Comments: <blank>    Hydrocodone-Acetaminophen Nausea And Vomiting    Sulfamethoxazole Palpitations       Changes to allergies: No    SPECIALTY MEDICATION ADHERENCE     Hyrdroxyurea 500 mg: 5 days of medicine on hand       Medication Adherence    Patient reported X missed doses in the last month: 0  Specialty Medication: Hyroxyurea 500mg   Patient is on additional specialty medications: No  Informant: patient          Specialty medication(s) dose(s) confirmed: Patient reports changes to the regimen as follows: 1 capsule every MWF and 2 on T,TH,S&S      Are there any concerns with adherence? No    Adherence counseling provided? Not needed    CLINICAL MANAGEMENT AND INTERVENTION      Clinical Benefit Assessment:    Do you feel the medicine is effective or helping your condition? No    Clinical Benefit counseling provided? Not needed    Adverse Effects Assessment:    Are you experiencing any side effects? No    Are you experiencing difficulty administering your medicine? No    Quality of Life Assessment:    Quality of Life    Rheumatology  Oncology  Dermatology  Cystic Fibrosis          How many days over the past month did your condition  keep you from your normal activities? For example, brushing your teeth or getting up in the morning. 0    Have you discussed this with your provider? Not needed    Acute Infection Status:    Acute infections noted within Epic:  No active infections  Patient reported infection: None    Therapy Appropriateness:    Is therapy appropriate and patient progressing towards therapeutic goals? Yes, therapy is appropriate and should be continued    DISEASE/MEDICATION-SPECIFIC INFORMATION      N/A    Oncology: Is the patient receiving adequate infection prevention treatment? Not applicable  Does the patient have adequate nutritional support? Not applicable    PATIENT SPECIFIC NEEDS     Does the patient have any physical, cognitive, or cultural barriers? No    Is the patient high risk? Yes, patient is taking oral chemotherapy. Appropriateness of therapy as been assessed    Did the patient require a clinical intervention? No    Does the patient require physician intervention or other additional services (i.e., nutrition, smoking cessation, social work)? No  SOCIAL DETERMINANTS OF HEALTH     At the [facility], we have learned that life circumstances - like trouble affording food, housing, utilities, or transportation can affect the health of many of our patients.   That is why we wanted to ask: are you currently experiencing any life circumstances that are negatively impacting your health and/or quality of life? No    Social Determinants of Health     Financial Resource Strain: Low Risk  (02/14/2021)    Overall Financial Resource Strain (CARDIA)     Difficulty of Paying Living Expenses: Not hard at all   Internet Connectivity: No Internet connectivity concern identified (06/27/2021)    Internet Connectivity     Do you have access to internet services: Yes     How do you connect to the internet: Personal Device at home     Is your internet connection strong enough for you to watch video on your device without major problems?: Yes     Do you have enough Gonzalez to get through the month?: Yes     Does at least one of the devices have a camera that you can use for video chat?: Yes   Food Insecurity: No Food Insecurity (02/14/2021)    Hunger Vital Sign     Worried About Running Out of Food in the Last Year: Never true     Ran Out of Food in the Last Year: Never true   Tobacco Use: Low Risk  (02/12/2022)    Received from [facility]    Patient History     Smoking Tobacco Use: Never     Smokeless Tobacco Use: Never     Passive Exposure: Not on file   Housing/Utilities: Low Risk  (02/14/2021)    Housing/Utilities     Within the past 12 months, have you ever stayed: outside, in a car, in a tent, in an overnight shelter, or temporarily in someone else's home (i.e. couch-surfing)?: No     Are you worried about losing your housing?: No     Within the past 12 months, have you been unable to get utilities (heat, electricity) when it was really needed?: No   Alcohol Use: Not At Risk (06/27/2021)    Alcohol Use     How often do you have a drink containing alcohol?: Never     How many drinks containing alcohol do you have on a typical day when you are drinking?: 1 - 2     How often do you have 5 or more drinks on one occasion?: Never   Transportation Needs: No Transportation Needs (02/14/2021)    PRAPARE - Transportation     Lack of Transportation (Medical): No     Lack of Transportation (Non-Medical): No   Substance Use: Low Risk  (06/27/2021)    Substance Use     Taken prescription drugs for non-medical reasons: Never     Taken illegal drugs: Never     Patient indicated they have taken drugs in the past year for non-medical reasons: Yes, [positive answer(s)]: Not on file   Health Literacy: Low Risk  (10/15/2019)    Health Literacy     : Never   Physical Activity: Not on file   Interpersonal Safety: Not at risk (06/27/2021)    Interpersonal Safety     Unsafe Where You Currently Live: No     Physically Hurt by Anyone: No     Abused by Anyone: No   Stress: No Stress Concern Present (  10/16/2021)    [employer] of Occupational Health - Occupational Stress Questionnaire     Feeling of Stress : Not at all   Intimate Partner Violence: Not At Risk (06/27/2021)    Humiliation, Afraid, Rape, and Kick questionnaire     Fear of Current or Ex-Partner: No     Emotionally Abused: No     Physically Abused: No     Sexually Abused: No   Depression: Not at risk (02/26/2022)    PHQ-2     PHQ-2 Score: 0   Social Connections: Not on file       Would you be willing to receive help with any of the needs that you have identified today? Not applicable       SHIPPING     Specialty Medication(s) to be Shipped:   Hematology/Oncology: Hydroxyurea    Other medication(s) to be shipped: No additional medications requested for fill at this time     Changes to insurance: No    Patient was informed of new phone menu: Yes    Delivery Scheduled: Yes, Expected medication delivery date: 03/08/22.     Medication will be delivered via Next Day Courier to the confirmed prescription address in [facility].    The patient will receive a drug information handout for each medication shipped and additional FDA Medication Guides as required.  Verified that patient has previously received a Conservation officer, historic buildings and a Surveyor, mining.    The patient or caregiver noted above participated in the development of this care plan and knows that they can request review of or adjustments to the care plan at any time.      All of the patient's questions and concerns have been addressed.    Cindy Gonzalez, PharmD   [facility] Specialty Pharmacist

## 2022-03-05 NOTE — Unmapped (Signed)
Pt states she has not had any chest pain or sob.  She only needs her Lasix twice a week when she feels like she needs it.  She uses her oxygen at night.

## 2022-03-06 ENCOUNTER — Ambulatory Visit: Admit: 2022-03-06 | Discharge: 2022-03-07 | Payer: MEDICARE

## 2022-03-06 DIAGNOSIS — Z1231 Encounter for screening mammogram for malignant neoplasm of breast: Principal | ICD-10-CM

## 2022-03-06 DIAGNOSIS — I10 Essential (primary) hypertension: Principal | ICD-10-CM

## 2022-03-06 DIAGNOSIS — Z0000 Encounter for general adult medical examination without abnormal findings: Principal | ICD-10-CM

## 2022-03-06 DIAGNOSIS — E785 Hyperlipidemia, unspecified: Principal | ICD-10-CM

## 2022-03-06 DIAGNOSIS — G250 Essential tremor: Principal | ICD-10-CM

## 2022-03-06 DIAGNOSIS — E118 Type 2 diabetes mellitus with unspecified complications: Principal | ICD-10-CM

## 2022-03-06 DIAGNOSIS — D751 Secondary polycythemia: Principal | ICD-10-CM

## 2022-03-06 DIAGNOSIS — M8589 Other specified disorders of bone density and structure, multiple sites: Principal | ICD-10-CM

## 2022-03-06 LAB — LIPID PANEL
CHOLESTEROL/HDL RATIO SCREEN: 3.7 (ref 1.0–4.5)
CHOLESTEROL: 143 mg/dL (ref <=200)
HDL CHOLESTEROL: 39 mg/dL — ABNORMAL LOW (ref 40–60)
LDL CHOLESTEROL CALCULATED: 69 mg/dL (ref 40–99)
NON-HDL CHOLESTEROL: 104 mg/dL (ref 70–130)
TRIGLYCERIDES: 177 mg/dL — ABNORMAL HIGH (ref 0–150)
VLDL CHOLESTEROL CAL: 35.4 mg/dL (ref 11–41)

## 2022-03-06 LAB — TSH: THYROID STIMULATING HORMONE: 2.006 u[IU]/mL (ref 0.550–4.780)

## 2022-03-06 LAB — HEMOGLOBIN A1C
ESTIMATED AVERAGE GLUCOSE: 160 mg/dL
HEMOGLOBIN A1C: 7.2 % — ABNORMAL HIGH (ref 4.8–5.6)

## 2022-03-06 MED ORDER — ESTRADIOL 0.01% (0.1 MG/GRAM) VAGINAL CREAM
Freq: Every day | VAGINAL | 11 refills | 21 days | Status: CP
Start: 2022-03-06 — End: ?

## 2022-03-06 NOTE — Unmapped (Addendum)
The following vaccines are recommended at your local pharmacy:  RSV vaccine  Covid 19 booster  Shingles (shingRix) vaccine

## 2022-03-07 NOTE — Unmapped (Signed)
Acceptable A1c is 7.2.  TSH is normal.  Lipid panel remain stable.  Continue with current diet and medication regimen as directed.

## 2022-03-09 ENCOUNTER — Ambulatory Visit: Admit: 2022-03-09 | Discharge: 2022-03-10 | Payer: MEDICARE

## 2022-03-09 LAB — COMPREHENSIVE METABOLIC PANEL
ALBUMIN: 3.8 g/dL (ref 3.4–5.0)
ALKALINE PHOSPHATASE: 42 U/L — ABNORMAL LOW (ref 46–116)
ALT (SGPT): 20 U/L (ref 10–49)
ANION GAP: 4 mmol/L — ABNORMAL LOW (ref 5–14)
AST (SGOT): 26 U/L (ref <=34)
BILIRUBIN TOTAL: 0.6 mg/dL (ref 0.3–1.2)
BLOOD UREA NITROGEN: 34 mg/dL — ABNORMAL HIGH (ref 9–23)
BUN / CREAT RATIO: 40
CALCIUM: 9.4 mg/dL (ref 8.7–10.4)
CHLORIDE: 110 mmol/L — ABNORMAL HIGH (ref 98–107)
CO2: 27 mmol/L (ref 20.0–31.0)
CREATININE: 0.86 mg/dL
EGFR CKD-EPI (2021) FEMALE: 67 mL/min/{1.73_m2} (ref >=60)
GLUCOSE RANDOM: 120 mg/dL (ref 70–179)
POTASSIUM: 4.8 mmol/L (ref 3.5–5.1)
PROTEIN TOTAL: 6.8 g/dL (ref 5.7–8.2)
SODIUM: 141 mmol/L (ref 135–145)

## 2022-03-09 LAB — CBC
HEMATOCRIT: 55.4 % — ABNORMAL HIGH (ref 34.0–44.0)
HEMOGLOBIN: 17.8 g/dL — ABNORMAL HIGH (ref 11.3–14.9)
MEAN CORPUSCULAR HEMOGLOBIN CONC: 32.1 g/dL (ref 32.0–36.0)
MEAN CORPUSCULAR HEMOGLOBIN: 27.7 pg (ref 25.9–32.4)
MEAN CORPUSCULAR VOLUME: 86.2 fL (ref 77.6–95.7)
MEAN PLATELET VOLUME: 8.2 fL (ref 6.8–10.7)
PLATELET COUNT: 153 10*9/L (ref 150–450)
RED BLOOD CELL COUNT: 6.43 10*12/L — ABNORMAL HIGH (ref 3.95–5.13)
RED CELL DISTRIBUTION WIDTH: 17.2 % — ABNORMAL HIGH (ref 12.2–15.2)
WBC ADJUSTED: 4.3 10*9/L (ref 3.6–11.2)

## 2022-03-09 LAB — IRON & TIBC
IRON SATURATION: 22 % (ref 20–55)
IRON: 89 ug/dL
TOTAL IRON BINDING CAPACITY: 411 ug/dL (ref 250–425)

## 2022-03-09 LAB — FERRITIN: FERRITIN: 18.3 ng/mL

## 2022-03-09 MED ADMIN — sodium chloride 0.9% (NS) bolus 250 mL: 250 mL | INTRAVENOUS | @ 18:00:00 | Stop: 2022-03-09

## 2022-03-09 NOTE — Unmapped (Signed)
Hospital Outpatient Visit on 03/09/2022   Component Date Value Ref Range Status    WBC 03/09/2022 4.3  3.6 - 11.2 10*9/L Final    RBC 03/09/2022 6.43 (H)  3.95 - 5.13 10*12/L Final    HGB 03/09/2022 17.8 (H)  11.3 - 14.9 g/dL Final    HCT 84/13/2440 55.4 (H)  34.0 - 44.0 % Final    MCV 03/09/2022 86.2  77.6 - 95.7 fL Final    MCH 03/09/2022 27.7  25.9 - 32.4 pg Final    MCHC 03/09/2022 32.1  32.0 - 36.0 g/dL Final    RDW 10/28/2534 17.2 (H)  12.2 - 15.2 % Final    MPV 03/09/2022 8.2  6.8 - 10.7 fL Final    Platelet 03/09/2022 153  150 - 450 10*9/L Final    Ferritin 03/09/2022 18.3  7.3 - 270.7 ng/mL Final    Iron 03/09/2022 89  50 - 170 ug/dL Final    TIBC 64/40/3474 411  250 - 425 ug/dL Final    Iron Saturation (%) 03/09/2022 22  20 - 55 % Final    Sodium 03/09/2022 141  135 - 145 mmol/L Final    Potassium 03/09/2022 4.8  3.5 - 5.1 mmol/L Final    Chloride 03/09/2022 110 (H)  98 - 107 mmol/L Final    CO2 03/09/2022 27.0  20.0 - 31.0 mmol/L Final    Anion Gap 03/09/2022 4 (L)  5 - 14 mmol/L Final    BUN 03/09/2022 34 (H)  9 - 23 mg/dL Final    Creatinine 25/95/6387 0.86  0.55 - 1.02 mg/dL Final    BUN/Creatinine Ratio 03/09/2022 40   Final    eGFR CKD-EPI (2021) Female 03/09/2022 67  >=60 mL/min/1.71m2 Final    eGFR calculated with CKD-EPI 2021 equation in accordance with [employer] and [employer] of Nephrology Task Force recommendations.    Glucose 03/09/2022 120  70 - 179 mg/dL Final    Calcium 56/43/3295 9.4  8.7 - 10.4 mg/dL Final    Albumin 18/84/1660 3.8  3.4 - 5.0 g/dL Final    Total Protein 03/09/2022 6.8  5.7 - 8.2 g/dL Final    Total Bilirubin 03/09/2022 0.6  0.3 - 1.2 mg/dL Final    AST 63/01/6008 26  <=34 U/L Final    ALT 03/09/2022 20  10 - 49 U/L Final    Alkaline Phosphatase 03/09/2022 42 (L)  46 - 116 U/L Final          RED ZONE Means: RED ZONE: Take action now!     You need to be seen right away  Symptoms are at a severe level of discomfort    Call 911 or go to your nearest  Hospital for help     - Bleeding that will not stop    - Hard to breathe    - New seizure - Chest pain  - Fall or passing out  -Thoughts of hurting    yourself or others      Call 911 if you are going into the RED ZONE                  YELLOW ZONE Means:     Please call with any new or worsening symptom(s), even if not on this list.  Call [phone]  After hours, weekends, and holidays - you will reach a long recording with specific instructions, If not in an emergency such as above, please listen closely all the way to the  end and choose the option that relates to your need.   You can be seen by a provider the same day through our Same Day Acute Care for Patients with Cancer program.      YELLOW ZONE: Take action today     Symptoms are new or worsening  You are not within your goal range for:    - Pain    - Shortness of breath    - Bleeding (nose, urine, stool, wound)    - Feeling sick to your stomach and throwing up    - Mouth sores/pain in your mouth or throat    - Hard stool or very loose stools (increase in       ostomy output)    - No urine for 12 hours    - Feeding tube or other catheter/tube issue    - Redness or pain at previous IV or port/catheter site    - Depressed or anxiety   - Swelling (leg, arm, abdomen,     face, neck)  - Skin rash or skin changes  - Wound issues (redness, drainage,    re-opened)  - Confusion  - Vision changes  - Fever >100.4 F or chills  - Worsening cough with mucus that is    green, yellow, or bloody  - Pain or burning when going to the    bathroom  - Home Infusion Pump Issue- call    [phone]         Call your healthcare provider if you are going into the YELLOW ZONE     GREEN ZONE Means:  Your symptoms are under controls  Continue to take your medicine as ordered  Keep all visits to the provider GREEN ZONE: You are in control  No increase or worsening symptoms  Able to take your medicine  Able to drink and eat    - DO NOT use MyChart messages to report red or yellow symptoms. Allow up to 3    business days for a reply.  -MyChart is for non-urgent medication refills, scheduling requests, or other general questions.         [id number] Rev. 06/30/2021  Approved by Oncology Patient Education Committee

## 2022-03-09 NOTE — Unmapped (Signed)
Patient came in for lab check and therapeutic phlebotomy. Hemoglobin was 17.8, per therapy plan. Per therapy plan remove of blood. Patient tolerated treatment. Replace with NS fluids over 1 hour. Patient tolerated treatment. Patient was stable at time of discharge. Patient self-ambulated to lobby.

## 2022-03-13 ENCOUNTER — Ambulatory Visit: Admit: 2022-03-13 | Discharge: 2022-03-14 | Payer: MEDICARE

## 2022-03-14 NOTE — Unmapped (Signed)
BDS reveals normal density at the lumbar spine and osteopenia of hip.  Recommend regular weight bearing exercise, good dietary calcium intake and OTC Vit D supplements.  Repeat BDS in 2 yrs.

## 2022-03-16 ENCOUNTER — Ambulatory Visit
Admission: EM | Admit: 2022-03-16 | Discharge: 2022-03-16 | Disposition: A | Discharge status: Home / Self Care | Payer: Medicare HMO

## 2022-03-16 DIAGNOSIS — M25551 Pain in right hip: Secondary | ICD-10-CM

## 2022-03-16 MED ORDER — PREDNISONE 20 MG PO TABS: 40.0000 mg | ORAL_TABLET | Freq: Every day | ORAL | 0 refills | Status: AC

## 2022-03-16 MED ORDER — TRAMADOL HCL 50 MG PO TABS: 50.0000 mg | ORAL_TABLET | Freq: Four times a day (QID) | ORAL | 0 refills | Status: AC | PRN

## 2022-03-16 NOTE — ED Triage Notes (Signed)
Pt c/o right side hip pain x4days  Pt states that the pain is along the right hip and flank and going down the lateral right leg.   Pt has taken tramadol pain medication from an old prescription. Pt states that it helped for a while.   Pt took 1 and a half pills at night for the pain.  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 Pt has a left foot spur.

## 2022-03-16 NOTE — ED Provider Notes (Signed)
[facility]    CSN: [id number] Arrival date & time: 03/16/22  0936      History   Chief Complaint Chief Complaint  Patient presents with   Hip Pain         HPI Jacqueline Arroyo is a 82 y.o. female presenting for right buttocks and lateral hip pain x 4 days.  She denies any sort of injury.  She says the pain does not radiate past her hip and denies any associated numbness, weakness or tingling.  Pain is worse with walking and bearing weight on the extremity.  She also reports that it becomes full stiff when she sits for too long.  She has tried meloxicam, Tylenol, heat and an old prescription of tramadol with some mild improvement in her symptoms.  She says she has a history of arthritis and knows that she has arthritis in her hips and thinks that could be the cause of her pain but is not sure.  She denies any associated fever, fatigue, chest pain, abdominal pain, dysuria, frequency, urgency, hematuria.  Patient reports that she has had appendectomy and cholecystectomy in the past that she is not worried about having those sorts of infections.  She also has a history of back surgery.  Additional history includes diabetes and hypertension.  No other complaints today.  HPI  Past Medical History:  Diagnosis Date   Diabetes mellitus without complication ([place])    Hypertension     There are no problems to display for this patient.   Past Surgical History:  Procedure Laterality Date   ABDOMINAL HYSTERECTOMY     BACK SURGERY     CHOLECYSTECTOMY     FOOT SURGERY      OB History   No obstetric history on file.      Home Medications    Prior to Admission medications   Medication Sig Start Date End Date Taking? Authorizing Provider  cycloSPORINE (RESTASIS) 0.05 % ophthalmic emulsion INSTILL 1 DROP INTO EACH EYE TWICE DAILY 01/29/20  Yes [provider]  hydroxyurea (HYDREA) 500 MG capsule Take by mouth. 02/26/22  Yes [provider]  predniSONE  (DELTASONE) 20 MG tablet Take 2 tablets (40 mg total) by mouth daily for 5 days. 03/16/22 03/21/22 Yes Danton Clap, PA-C  traMADol (ULTRAM) 50 MG tablet Take 1 tablet (50 mg total) by mouth every 6 (six) hours as needed for up to 3 days for severe pain. 03/16/22 03/19/22 Yes Laurene Footman B, PA-C  acetic acid 2 % otic solution SMARTSIG:Left Ear 01/19/21   [provider]  albuterol (VENTOLIN HFA) 108 (90 Base) MCG/ACT inhaler Inhale into the lungs. 07/12/19   [provider]  amLODipine (NORVASC) 10 MG tablet Take 10 mg by mouth daily. 09/01/20   [provider]  aspirin 81 MG EC tablet Take by mouth. 07/17/19   [provider]  atorvastatin (LIPITOR) 10 MG tablet Take 1 tablet by mouth daily. 03/01/20   [provider]  calcium-vitamin D (OSCAL WITH D) 500-200 MG-UNIT TABS tablet Take by mouth.    [provider]  Cyanocobalamin 2000 MCG TBCR Take by mouth.    [provider]  estradiol (ESTRACE) 0.1 MG/GM vaginal cream SMARTSIG:Gram(s) Vaginal 3 Times a Week 08/17/20   [provider]  fenofibrate (TRICOR) 145 MG tablet Take by mouth. 06/27/20   [provider]  fexofenadine (ALLEGRA) 180 MG tablet Take 1 tablet (180 mg total) by mouth daily. 09/12/20   Rodriguez-Southworth, Sunday Spillers, PA-C  FLOVENT HFA 110 MCG/ACT inhaler SMARTSIG:2 Puff(s) By Mouth Twice Daily 05/09/20   [provider]  fluticasone (FLONASE) 50 MCG/ACT nasal spray 1 spray into each nostril daily. 12/27/20 12/27/21  [provider]  furosemide (LASIX) 20 MG tablet Take by mouth. 07/23/19   [provider]  ketoconazole (NIZORAL) 2 % cream Apply topically daily. 03/11/20   [provider]  Lancets (FREESTYLE) lancets Check blood sugar twice daily 12/27/20   [provider]  lisinopril (ZESTRIL) 30 MG tablet Take 1 tablet by mouth daily. 12/29/20 12/29/21  [provider]  LORazepam (ATIVAN) 0.5 MG tablet Take by  mouth. 07/31/19   [provider]  meclizine (ANTIVERT) 12.5 MG tablet Take 12.5 mg by mouth 2 (two) times daily as needed. 01/18/21   [provider]  meloxicam (MOBIC) 7.5 MG tablet Take 1 tablet by mouth daily. 01/26/20   [provider]  metFORMIN (GLUCOPHAGE) 500 MG tablet Take 500 mg by mouth daily. 08/31/20   [provider]  methocarbamol (ROBAXIN) 500 MG tablet Take 1 tablet (500 mg total) by mouth 2 (two) times daily. 01/14/22   Lyndee Hensen, DO  metoprolol succinate (TOPROL-XL) 50 MG 24 hr tablet Take 50 mg by mouth 2 (two) times daily. 09/08/19   [provider]  spironolactone (ALDACTONE) 25 MG tablet Take 25 mg by mouth daily. 08/06/20   [provider]  UNABLE TO FIND Med Name: HCTZ 10 mg    [provider]    Family History History reviewed. No pertinent family history.  Social History Social History   Tobacco Use   Smoking status: Never   Smokeless tobacco: Never  Vaping Use   Vaping Use: Never used  Substance Use Topics   Alcohol use: Never   Drug use: Never     Allergies   Amoxicillin, Ciprofloxacin, Keflex [cephalexin], Hydrocodone-acetaminophen, and Sulfamethoxazole   Review of Systems Review of Systems  Constitutional:  Negative for fatigue and fever.  Respiratory:  Negative for shortness of breath.   Cardiovascular:  Negative for chest pain.  Gastrointestinal:  Negative for abdominal pain, nausea and vomiting.  Genitourinary:  Negative for difficulty urinating, dysuria, hematuria, pelvic pain and urgency.  Musculoskeletal:  Positive for arthralgias and back pain. Negative for gait problem and joint swelling.  Skin:  Negative for rash and wound.  Neurological:  Negative for weakness and numbness.     Physical Exam Triage Vital Signs ED Triage Vitals  Enc Vitals Group     BP 03/16/22 1148 (!) 159/71     Pulse Rate 03/16/22 1148 76     Resp --      Temp 03/16/22 1148 97.6 F (36.4 C)      Temp Source 03/16/22 1148 Oral     SpO2 03/16/22 1148 96 %     Weight 03/16/22 1143 170 lb (77.1 kg)     Height 03/16/22 1143 5' (1.524 m)     Head Circumference --      Peak Flow --      Pain Score 03/16/22 1141 10     Pain Loc --      Pain Edu? --      Excl. in [place]? --    No data found.  Updated Vital Signs BP (!) 159/71 (BP Location: Left Arm)   Pulse 76   Temp 97.6 F (36.4 C) (Oral)   Ht 5' (1.524 m)   Wt 170 lb (77.1 kg)   SpO2 96%   BMI 33.20  kg/m       Physical Exam Vitals and nursing note reviewed.  Constitutional:      General: She is not in acute distress.    Appearance: Normal appearance. She is not ill-appearing or toxic-appearing.  HENT:     Head: Normocephalic and atraumatic.  Eyes:     General: No scleral icterus.       Right eye: No discharge.        Left eye: No discharge.     Conjunctiva/sclera: Conjunctivae normal.  Cardiovascular:     Rate and Rhythm: Normal rate and regular rhythm.     Heart sounds: Normal heart sounds.  Pulmonary:     Effort: Pulmonary effort is normal. No respiratory distress.     Breath sounds: Normal breath sounds.  Musculoskeletal:     Cervical back: Neck supple.     Lumbar back: Tenderness (TTP right paralumbar  muscles and right buttocks) present. Decreased range of motion. Positive right straight leg raise test. Negative left straight leg raise test.     Right hip: Tenderness (lateral hip) present. No deformity. Decreased range of motion. Normal strength.  Skin:    General: Skin is dry.  Neurological:     General: No focal deficit present.     Mental Status: She is alert. Mental status is at baseline.     Motor: No weakness.     Gait: Gait abnormal (walks with cane).  Psychiatric:        Mood and Affect: Mood normal.        Behavior: Behavior normal.        Thought Content: Thought content normal.      UC Treatments / Results  Labs (all labs ordered are listed, but only abnormal results are displayed) Labs  Reviewed - No data to display  EKG   Radiology No results found.  Procedures Procedures (including critical care time)  Medications Ordered in UC Medications - No data to display  Initial Impression / Assessment and Plan / UC Course  I have reviewed the triage vital signs and the nursing notes.  Pertinent labs & imaging results that were available during my care of the patient were reviewed by me and considered in my medical decision making (see chart for details).   82 year old female presents for right hip pain for the past 4 days.  Denies injury.  Has known history of arthritis in the hip.  Has tried meloxicam, Tylenol, tramadol and heat with some improvement in symptoms.  She denies any red flag signs or symptoms.  Likely flareup of underlying arthritis versus lumbar radiculopathy.  Patient would like to try prednisone.  She has had some improvement with her symptoms with prednisone in the past.  She is aware that it can increase her blood sugar and will monitor closely.  Sent prednisone to pharmacy.  Also sent a few tablets of tramadol as needed for severe pain.  She did not routinely take this medication but has taken it before with some improvement in her symptoms.  She says she tried half a tablet at that time and that usually helps.  Encouraged to continue with heat, ice, stretching.  Reports that she has an appointment with her PCP in 3 days regarding foot pain.  Advised her to follow-up with PCP at that time if pain continues.  ED precautions discussed.   Final Clinical Impressions(s) / UC Diagnoses   Final diagnoses:  Right hip pain     Discharge Instructions      -  I sent prednisone at your request.  Hopefully this helps your hip.  You can also continue with the Tylenol, heat, muscle rubs, lidocaine patches. - You may take half to 1 tramadol as needed for severe pain. - If your pain acutely worsens, please follow-up with orthopedics or ER.     ED Prescriptions      Medication Sig Dispense Auth. Provider   predniSONE (DELTASONE) 20 MG tablet Take 2 tablets (40 mg total) by mouth daily for 5 days. 10 tablet Laurene Footman B, PA-C   traMADol (ULTRAM) 50 MG tablet Take 1 tablet (50 mg total) by mouth every 6 (six) hours as needed for up to 3 days for severe pain. 10 tablet Danton Clap, PA-C      I have reviewed the PDMP during this encounter.   Danton Clap, PA-C 03/16/22 1221

## 2022-03-16 NOTE — ED Triage Notes (Signed)
Pt vomited yesterday after taking cyanocobalamin. Pt believes it is because she is taking Hydroxyurea.

## 2022-03-16 NOTE — Discharge Instructions (Addendum)
-  I sent prednisone at your request.  Hopefully this helps your hip.  You can also continue with the Tylenol, heat, muscle rubs, lidocaine patches. - You may take half to 1 tramadol as needed for severe pain. - If your pain acutely worsens, please follow-up with orthopedics or ER.

## 2022-03-17 ENCOUNTER — Ambulatory Visit
Admit: 2022-03-17 | Discharge: 2022-03-17 | Disposition: A | Discharge status: Home / Self Care | Payer: MEDICARE | Attending: Emergency Medicine

## 2022-03-17 DIAGNOSIS — T50905A Adverse effect of unspecified drugs, medicaments and biological substances, initial encounter: Principal | ICD-10-CM

## 2022-03-17 DIAGNOSIS — R450 Nervousness: Principal | ICD-10-CM

## 2022-03-17 LAB — CBC W/ AUTO DIFF
BASOPHILS ABSOLUTE COUNT: 0 10*9/L (ref 0.0–0.1)
BASOPHILS RELATIVE PERCENT: 0.7 %
EOSINOPHILS ABSOLUTE COUNT: 0 10*9/L (ref 0.0–0.5)
EOSINOPHILS RELATIVE PERCENT: 0.5 %
HEMATOCRIT: 54.9 % — ABNORMAL HIGH (ref 34.0–44.0)
HEMOGLOBIN: 17.7 g/dL — ABNORMAL HIGH (ref 11.3–14.9)
LYMPHOCYTES ABSOLUTE COUNT: 0.6 10*9/L — ABNORMAL LOW (ref 1.1–3.6)
LYMPHOCYTES RELATIVE PERCENT: 12.1 %
MEAN CORPUSCULAR HEMOGLOBIN CONC: 32.2 g/dL (ref 32.0–36.0)
MEAN CORPUSCULAR HEMOGLOBIN: 28.2 pg (ref 25.9–32.4)
MEAN CORPUSCULAR VOLUME: 87.4 fL (ref 77.6–95.7)
MEAN PLATELET VOLUME: 8.4 fL (ref 6.8–10.7)
MONOCYTES ABSOLUTE COUNT: 0.4 10*9/L (ref 0.3–0.8)
MONOCYTES RELATIVE PERCENT: 7.6 %
NEUTROPHILS ABSOLUTE COUNT: 3.9 10*9/L (ref 1.8–7.8)
NEUTROPHILS RELATIVE PERCENT: 79.1 %
NUCLEATED RED BLOOD CELLS: 0 /100{WBCs} (ref <=4)
PLATELET COUNT: 114 10*9/L — ABNORMAL LOW (ref 150–450)
RED BLOOD CELL COUNT: 6.28 10*12/L — ABNORMAL HIGH (ref 3.95–5.13)
RED CELL DISTRIBUTION WIDTH: 19.8 % — ABNORMAL HIGH (ref 12.2–15.2)
WBC ADJUSTED: 5 10*9/L (ref 3.6–11.2)

## 2022-03-17 LAB — URINALYSIS WITH MICROSCOPY WITH CULTURE REFLEX
BACTERIA: NONE SEEN /HPF
BILIRUBIN UA: NEGATIVE
BLOOD UA: NEGATIVE
GLUCOSE UA: NEGATIVE
KETONES UA: NEGATIVE
NITRITE UA: NEGATIVE
PH UA: 7 (ref 5.0–9.0)
PROTEIN UA: NEGATIVE
RBC UA: <1 /HPF (ref <=4)
SPECIFIC GRAVITY UA: 1.007 (ref 1.003–1.030)
SQUAMOUS EPITHELIAL: <1 /HPF (ref 0–5)
UROBILINOGEN UA: <2
WBC UA: 1 /HPF (ref 0–5)

## 2022-03-17 LAB — BASIC METABOLIC PANEL
ANION GAP: 6 mmol/L (ref 5–14)
BLOOD UREA NITROGEN: 25 mg/dL — ABNORMAL HIGH (ref 9–23)
BUN / CREAT RATIO: 29
CALCIUM: 10.5 mg/dL — ABNORMAL HIGH (ref 8.7–10.4)
CHLORIDE: 110 mmol/L — ABNORMAL HIGH (ref 98–107)
CO2: 25.4 mmol/L (ref 20.0–31.0)
CREATININE: 0.87 mg/dL
EGFR CKD-EPI (2021) FEMALE: 66 mL/min/{1.73_m2} (ref >=60)
GLUCOSE RANDOM: 162 mg/dL (ref 70–179)
POTASSIUM: 4.7 mmol/L (ref 3.4–4.8)
SODIUM: 141 mmol/L (ref 135–145)

## 2022-03-17 LAB — HIGH SENSITIVITY TROPONIN I - SINGLE: HIGH SENSITIVITY TROPONIN I: 20 ng/L (ref <=34)

## 2022-03-17 MED ADMIN — LORazepam (ATIVAN) tablet 0.25 mg: .25 mg | ORAL | @ 18:00:00 | Stop: 2022-03-17

## 2022-03-17 NOTE — Unmapped (Addendum)
BIB [facility] from home for shakiness and nervousness after taking prednisone and hydroxyurea.

## 2022-03-17 NOTE — Unmapped (Signed)
Bed: 08  Expected date:   Expected time:   Means of arrival:   Comments:  EMS

## 2022-03-17 NOTE — Unmapped (Signed)
[facility]  Emergency Department Provider Note      ED Clinical Impression      Final diagnoses:   Adverse effect of drug, initial encounter (Primary)   Nervousness            Impression, Medical Decision Making, Progress Notes and Critical Care      Impression, Differential Diagnosis and Plan of Care    Patient is a 82 y.o. female with a PMH of arthritis, polycythemia, CAD, HF, heart murmur, HTN, T2DM, back surgery, and baseline tremor who presented to the ED  per EMS for shakiness and nervousness in the setting of taking 10 mg of prednisone (spread over 2 half doses) yesterday.    On exam, patient is overall well-appearing and in no acute distress. Vital signs are normotensive, non tachycardic, afebrile, and saturating 95% on room air with no signs of any respiratory distress. Physical exam is notable for intention tremor bilateral upper extremities..     Differential includes medication reaction to prednisone versus electrolyte disturbance versus dehydration.  On chart review, patient had a TSH on 3-5 that was normal, lower suspicion for hyperthyroidism.  Lower suspicion for primary dysrhythmia.    Plan for EKG, trop, UA w/reflex, BMP, and CBC.       Independent Interpretation of Studies: I have independently reviewed EKG and noted sinus rhythm at 86 with a first-degree AV block, interventricular conduction delay, questionable LVH.  When compared to prior EKG from October 2023, no significant change was noted.  External Records Reviewed: Patient's most recent outside Emergency Department visit 03/16/2022 [facility] ED Note (details patient's recent evaluation and medication treatment for hip pain including prednisone)  History obtained from other sources: None    Additional Progress Notes  EKG similar to prior.  Hemoglobin of 17.7 also similar to prior.  Remainder of other labs are reassuring.  Patient reported improvement in symptoms in the ED with observation.  Did request a dose of her home Ativan prior to discharge but states that she does feel improved.  I suspect that her symptoms are related to starting prednisone.  Will discharge home with PCP follow-up, return precautions given.    Portions of this record have been created using Scient[employer], clinical (histocompatibility and immunogenetics). Dictation errors have been sought, but may not have been identified and corrected.    See chart and resident provider documentation for details.    ____________________________________________         History        Reason for Visit  Medication Reaction      HPI   Cindy Gonzalez is a 82 y.o. female with a history notable for arthritis, polycythemia, CAD, HF, heart murmur, HTN, T2DM, back surgery, and baseline tremor who presented to the ED for evaluation of possible adverse reaction to medicine. The patient reports per EMS for shakiness and nervousness in the setting of taking 10 mg of prednisone (spread over 2 half doses) yesterday. Per chart, patient was started on prednisone yesterday at [facility] for 4 days of right buttock and lateral hip pain. She walks with a cane at baseline. She reports that she has had adverse reactions to prednisone in the past including elevated blood glucose and nervousness but thought that it might not bother her if she split the dose in 2. She also notes that she recently started hydroxyurea (1 month ago) for polycythemia .  No other new medications.  Endorses heart palpitations. Denies urinary symptoms. Denies recent fever.  The patient notes that she has not taken any of her morning medications or glucose this morning.    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)    Hypoxemia    Essential tremor    Hyponatremia    Acute disorder of liver    NICM (nonischemic cardiomyopathy) (CMS-HCC)    Extranodal marginal zone B-cell lymphoma (CMS-HCC)    Leukopenia    Type 2 diabetes with complication (CMS-HCC)    Dyslipidemia    History of lymphoma    Arthritis    Seborrheic dermatitis    Insomnia    Obesity    Osteoarthritis    Mild aortic stenosis    Dryness of vagina    Skin carcinoma    Vertigo    History of pneumonia    History of constipation    Erythrocytosis    Iron deficiency anemia    Iron deficiency       Past Surgical History:   Procedure Laterality Date    CHOLECYSTECTOMY      HYSTERECTOMY      OOPHORECTOMY         No current facility-administered medications for this encounter.    Current Outpatient Medications:     albuterol HFA 90 mcg/actuation inhaler, Inhale 2 puffs every six (6) hours as needed for wheezing., Disp: 18 g, Rfl: 0    amlodipine (NORVASC) 10 MG tablet, Take 1 tablet (10 mg total) by mouth daily., Disp: 90 tablet, Rfl: 3    aspirin (ECOTRIN) 81 MG tablet, Take 1 tablet (81 mg total) by mouth daily., Disp: 30 tablet, Rfl: 11    atorvastatin (LIPITOR) 10 MG tablet, Take 1 tablet (10 mg total) by mouth daily., Disp: 90 tablet, Rfl: 3    blood sugar diagnostic (ACCU-CHEK GUIDE TEST STRIPS) Strp, by Other route Two (2) times a day (30 minutes before a meal)., Disp: 200 each, Rfl: 1    blood-glucose meter kit, Use as instructed, Disp: 1 each, Rfl: 0    calcium-vitamin D 500 mg(1,250mg ) -200 unit per tablet, Take 1 tablet by mouth in the morning and 1 tablet in the evening. Take with meals., Disp: , Rfl:     cyanocobalamin 2000 MCG tablet, Take 1 tablet (2,000 mcg total) by mouth daily., Disp: , Rfl:     estradiol (ESTRACE) 0.01 % (0.1 mg/gram) vaginal cream, Insert 2 g into the vagina daily. Use externally prn, Disp: 42.5 g, Rfl: 11    fenofibrate (TRICOR) 145 MG tablet, Take 1 tablet (145 mg total) by mouth daily., Disp: 90 tablet, Rfl: 3    fluocinolone acetonide oil 0.01 % Drop, Administer 1 drop into the left ear two (2) times a day. (Patient taking differently: Administer 1 drop into the left ear two (2) times a day. prn), Disp: 20 mL, Rfl: 0    fluticasone propionate (FLONASE) 50 mcg/actuation  nasal spray, 1 spray into each nostril daily., Disp: 16 g, Rfl: 11    fluticasone propionate (FLONASE) 50 mcg/actuation nasal spray, 1 spray into each nostril daily. (Patient not taking: Reported on 03/06/2022), Disp: 16 g, Rfl: 11    furosemide (LASIX) 20 MG tablet, TAKE 1 TABLET BY MOUTH THREE TIMES A WEEK, Disp: 36 tablet, Rfl: 0    hydroxyurea (HYDREA) 500 mg capsule, Take 1 capsule (500 mg total) by mouth Every Monday, Wednesday, and Friday AND 2 capsules (1,000 mg total) Every Tuesday, Thursday, Saturday, Sunday. (Patient not taking: Reported on 03/06/2022), Disp: 132 capsule, Rfl: 3    ketoconazole (NIZORAL) 2 % cream, APPLY  CREAM TOPICALLY ONCE DAILY FOR SCALP RASH, Disp: 30 g, Rfl: 0    lancets (ACCU-CHEK SOFTCLIX LANCETS) Misc, Use as directed to check glucose twice per day, Disp: 200 each, Rfl: 5    lancing device Misc, USE TO CHECK BLOOD SUGAR 2 TIMES A DAY, Disp: 100 each, Rfl: 2    lisinopril (PRINIVIL,ZESTRIL) 30 MG tablet, Take 1 tablet (30 mg total) by mouth daily., Disp: 90 tablet, Rfl: 3    LORazepam (ATIVAN) 0.5 MG tablet, TAKE 1/2 (ONE-HALF) TABLET BY MOUTH TWICE DAILY AS NEEDED FOR ANXIETY, Disp: 30 tablet, Rfl: 0    meloxicam (MOBIC) 7.5 MG tablet, Take 1 tablet (7.5 mg total) by mouth daily., Disp: 90 tablet, Rfl: 1    metFORMIN (GLUCOPHAGE) 500 MG tablet, 1 tablet in the AM and 1/2 tab with dinner daily, Disp: 180 tablet, Rfl: 3    metoPROLOL succinate (TOPROL-XL) 50 MG 24 hr tablet, TAKE 1 TABLET BY MOUTH AT BEDTIME, Disp: 90 tablet, Rfl: 3    mupirocin (BACTROBAN) 2 % ointment, Apply 1 Application topically Two (2) times a day (at 8am and 12:00)., Disp: , Rfl:     RESTASIS 0.05 % ophthalmic emulsion, INSTILL 1 DROP INTO EACH EYE TWICE DAILY, Disp: , Rfl:     spironolactone (ALDACTONE) 25 MG tablet, Take 1 tablet (25 mg total) by mouth daily., Disp: 90 tablet, Rfl: 3    traMADoL (ULTRAM) 50 mg tablet, Take 1 tablet (50 mg total) by mouth every six (6) hours as needed for pain., Disp: 30 tablet, Rfl: 0    Allergies  Amoxicillin, Cephalexin, Ciprofloxacin, Hydrocodone-acetaminophen, and Sulfamethoxazole    Family History   Problem Relation Age of Onset    Cancer Other     Diabetes Mother     Asthma Mother     Hypertension Father     Stroke Father        Social History  Social History     Tobacco Use    Smoking status: Never    Smokeless tobacco: Never   Vaping Use    Vaping status: Never Used   Substance Use Topics    Alcohol use: Not Currently    Drug use: Never          Physical Exam     This provider entered the patient's room: Yes:    If this provider did not enter the room, a comprehensive physical exam was not able to be performed due to increased infection risk to themselves, other providers, staff and other patients), as well as to conserve personal protective equipment (PPE) utilization during the COVID-19 pandemic.    If this provider did enter the patient room, the following was PPE worn: Surgical mask, eye protection and gloves     ED Triage Vitals [03/17/22 0916]   Enc Vitals Group  BP 175/81      Heart Rate 94      SpO2 Pulse       Resp 20      Temp 36.4 ??C (97.5 ??F)      Temp Source Oral      SpO2 96 %     Constitutional: Alert and oriented. Well appearing and in no distress.  Eyes: Conjunctivae are normal.  ENT       Head: Normocephalic and atraumatic.       Nose: No congestion.       Mouth/Throat: Mucous membranes are moist.       Neck: No stridor.  Hematological/Lymphatic/Immunilogical: No cervical lymphadenopathy.  Cardiovascular: Normal rate, regular rhythm. Normal and symmetric distal pulses are present in all extremities.  Respiratory: Normal respiratory effort. Breath sounds are normal.  Gastrointestinal: Soft and nontender. There is no CVA tenderness.  Musculoskeletal: Normal range of motion in all extremities.       Right lower leg: No tenderness or edema.       Left lower leg: No tenderness or edema.  Neurologic: Normal speech and language. No gross focal neurologic deficits are appreciated.  Intention tremor noted upper extremities.  Skin: Skin is warm, dry and intact. No rash noted.  Psychiatric: Mood and affect are normal. Speech and behavior are normal.              Documentation assistance was provided by Lance Morin, Scribe, on March 17, 2022 at 10:09 AM for Shaune Leeks, MD.     March 18, 2022 9:03 AM. Documentation assistance provided by the scribe. I was present during the time the encounter was recorded. The information recorded by the scribe was done at my direction and has been reviewed and validated by me.     Note has been documented by Victorino Dike L. Standish on 03/17/2022     Sherryl Barters, MD  03/18/22 [phone]

## 2022-03-18 NOTE — Unmapped (Signed)
Cindy Gonzalez  [birth year]    Assessment & Plan:  Pain of foot, unspecified laterality  -     XR Foot 2 Views Left; Future  -     XR Ankle 2 Views Left; Future  -     Ambulatory referral to Podiatry; Future    Left ankle swelling  -     XR Foot 2 Views Left; Future  -     XR Ankle 2 Views Left; Future  -     Ambulatory referral to Podiatry; Future    Primary osteoarthritis of left foot  -     Ambulatory referral to Podiatry; Future    Pes planus of left foot  -     Ambulatory referral to Podiatry; Future      -----------------------------------------  82 yo female presents today for left foot pain  -She has been having left foot pain for around 1 week. She has not had any accidents/injuries/trauma that she knows of   -On exam, her physical exam is most consistent with a plantar fibroma vs cyst   -There are no signs of an infection at this time. This was discussed in depth with her and advised against abx at this time.   -She does additionally have swelling of her left ankle which appears to be chronic?    PLAN  -Will obtain XR of left foot and left ankle  -Replacing referral to Podiatry, who she is already established with (last saw in 03/2021) for OA of her foot  -Advised okay to continue to take tylenol PRN for pain.     No follow-ups on file.    Subjective:  Subjective    HPI: Cindy Gonzalez is a 82 y.o. female here for pain on the bottom of my foot    Onset: around 1 week  Location: bottom of foot    Characteristics: achy  Issues walking? yes  Accidents/trauma?denies    Previously saw podiatry for foot pain/arthritis     ROS:  Review of systems negative unless otherwise noted as per HPI.    Objective:  Objective    Vitals:    03/19/22 1443   BP: 134/78   Pulse: 86   Temp: 37.1 ??C (98.7 ??F)   SpO2: 96%     Body mass index is 33.86 kg/m??.    Physical Exam:  Physical Exam  Constitutional:       Appearance: Normal appearance.   HENT:      Head: Normocephalic and atraumatic.      Nose: No congestion. Mouth/Throat:      Mouth: Mucous membranes are moist.   Eyes:      General:         Right eye: No discharge.         Left eye: No discharge.   Pulmonary:      Effort: Pulmonary effort is normal. No respiratory distress.      Breath sounds: No wheezing.   Musculoskeletal:        Feet:       Comments: Ambulating with a cane. Her gait does appear mildly unstable but this appears more due to the type of cane she has   Feet:      Comments: Spot noted above: It appears more consistent with a cyst on the bottom of her feet. She does not have any callous, ulcers or cuts on her feet. She does not have erythema or increased warmth.   Her left ankle does appear swollen  compared to right  Neurological:      General: No focal deficit present.      Mental Status: She is alert and oriented to person, place, and time.      Cranial Nerves: No cranial nerve deficit.      Comments: Head tremor at baseline   Psychiatric:         Mood and Affect: Mood normal.

## 2022-03-19 ENCOUNTER — Ambulatory Visit
Admit: 2022-03-19 | Discharge: 2022-03-20 | Payer: MEDICARE | Attending: Student in an Organized Health Care Education/Training Program | Primary: Student in an Organized Health Care Education/Training Program

## 2022-03-19 DIAGNOSIS — G250 Essential tremor: Principal | ICD-10-CM

## 2022-03-19 DIAGNOSIS — M19072 Primary osteoarthritis, left ankle and foot: Principal | ICD-10-CM

## 2022-03-19 DIAGNOSIS — M2142 Flat foot [pes planus] (acquired), left foot: Principal | ICD-10-CM

## 2022-03-19 DIAGNOSIS — G4700 Insomnia, unspecified: Principal | ICD-10-CM

## 2022-03-19 DIAGNOSIS — M25472 Effusion, left ankle: Principal | ICD-10-CM

## 2022-03-19 DIAGNOSIS — M79673 Pain in unspecified foot: Principal | ICD-10-CM

## 2022-03-19 MED ORDER — LORAZEPAM 0.5 MG TABLET
ORAL_TABLET | 0 refills | 0 days
Start: 2022-03-19 — End: ?

## 2022-03-19 NOTE — Unmapped (Signed)
.  Hi,     Cindy Gonzalez  contacted the [employer] requesting to speak with the care team of Cindy Gonzalez to discuss:    Cindy Gonzalez is calling statin that she is experiencing some dizziness and weakness in her legs and hip and shoulders    Please contact Aeisha back  at [phone] asap.      Thank you,   Noland Fordyce  [facility]   [phone]

## 2022-03-19 NOTE — Unmapped (Signed)
[facility] Triage Note     Patient: Cindy Gonzalez     Reason for call:  return call    Time call returned: 1113    Phone Assessment: Pt reporting dizziness that is becoming more frequently and arthritis has gotten worse and she feels the muscles are weaker since starting the hydroxyurea.  She was at the ED yesterday but was released and told to follow up with oncology. She has not fallen and knows to stand for a few seconds before moving to make sure the dizziness subsides to prevent falls.  She is also drinking plenty of fluids and does not feel it is dehydration.          Triage Recommendations: RN will forward concerns to the team and someone will call her back with further instructions

## 2022-03-20 ENCOUNTER — Ambulatory Visit: Admit: 2022-03-20 | Discharge: 2022-03-21 | Payer: MEDICARE

## 2022-03-20 DIAGNOSIS — M79673 Pain in unspecified foot: Principal | ICD-10-CM

## 2022-03-20 DIAGNOSIS — M25472 Effusion, left ankle: Principal | ICD-10-CM

## 2022-03-20 DIAGNOSIS — M19072 Primary osteoarthritis, left ankle and foot: Principal | ICD-10-CM

## 2022-03-20 DIAGNOSIS — M2142 Flat foot [pes planus] (acquired), left foot: Principal | ICD-10-CM

## 2022-03-20 MED ORDER — LORAZEPAM 0.5 MG TABLET
ORAL_TABLET | 0 refills | 0 days | Status: CP
Start: 2022-03-20 — End: ?

## 2022-03-20 NOTE — Unmapped (Signed)
[facility] ED Post-Discharge Call    5 or more prescribed daily medications    Contacted patient at [phone] to discuss ED visit. Confirmed patient identity.     We want to ensure you understood your plan of care. Review AVS instructions. Did your discharge instructions answer all of your questions? yes  Have you made a follow-up appointment? yes  Have you filled your prescriptions (if applicable)? N/A  Do you have any questions regarding your prescriptions?N/A  Do you have any other questions? No  Return precautions discussed.

## 2022-03-21 DIAGNOSIS — M879 Osteonecrosis, unspecified: Principal | ICD-10-CM

## 2022-03-21 DIAGNOSIS — M19072 Primary osteoarthritis, left ankle and foot: Principal | ICD-10-CM

## 2022-03-21 DIAGNOSIS — M25472 Effusion, left ankle: Principal | ICD-10-CM

## 2022-03-21 NOTE — Unmapped (Signed)
Attempted to contact patient regarding XR results. Unable to reach patient but left voice mail.     XR results noted the following     XR foot, left (03/20/2022)  FINDINGS:   Diffuse mineralization. No acute fractures identified.      Flattening and bony remodeling of the lateral aspect of the navicular, which can be seen in osteonecrosis. Mild dorsal subluxation of the talonavicular joint.      Mild to moderate multifocal osteoarthritis of the interphalangeal joints.      Hallux valgus metatarsus primus varus with uncovering lateral hallux sesamoid and bunion deformity. Mild lateral subluxation of the first MTP joint. Bipartite medial hallux sesamoid.      Mild to moderate multifocal midfoot osteoarthritis, greatest at the fourth and fifth tarsometatarsal joints. Small calcaneal enthesophytes.      No focal soft tissue swelling nor mineralization.       XR Ankle Left (03/20/2022)  FINDINGS:      No fractures or acute abnormalities are seen. Visualized tibia, fibula, subtalar joint, and tarsal bones are normal.      The mortise of the ankle joint has a normal appearance as does the distal tibio-fibular syndesmosis.      Lateral soft tissue swelling suggesting ligamentous injury. No joint effusion. Demineralization.       Given the findings found on xray of foot, in particular : Flattening and bony remodeling of the lateral aspect of the navicular, which can be seen in osteonecrosis, will place for MRI of left foot.     Referral was placed to [facility] at visit on 03/19/2022 as well to Dr. Ether Griffins (this is the provider she saw previously)     We will attempt to contact patient again regarding results.   Will route note to PCP as well.

## 2022-03-22 ENCOUNTER — Ambulatory Visit: Admit: 2022-03-22 | Discharge: 2022-03-23 | Payer: MEDICARE

## 2022-03-22 DIAGNOSIS — I10 Essential (primary) hypertension: Principal | ICD-10-CM

## 2022-03-22 DIAGNOSIS — E785 Hyperlipidemia, unspecified: Principal | ICD-10-CM

## 2022-03-22 DIAGNOSIS — E118 Type 2 diabetes mellitus with unspecified complications: Principal | ICD-10-CM

## 2022-03-22 DIAGNOSIS — I428 Other cardiomyopathies: Principal | ICD-10-CM

## 2022-03-22 DIAGNOSIS — I2510 Atherosclerotic heart disease of native coronary artery without angina pectoris: Principal | ICD-10-CM

## 2022-03-22 NOTE — Unmapped (Signed)
Please call [phone] to schedule your imaging study.        [facility]  [address]  [place], [place] [address]      [facility]  [address]  [place], [place] [address]

## 2022-03-22 NOTE — Unmapped (Signed)
Assessment and Plan:     Cindy Gonzalez. Cindy Gonzalez was seen today for follow-up.    Diagnoses and all orders for this visit:    Primary hypertension  Coronary artery disease involving native coronary artery of native heart without angina pectoris  NICM (nonischemic cardiomyopathy) (CMS-HCC)          -     Due to difficultly with transport to [facility], would like local cardiology referral        -     Referral placed with [facility] at [facility], appointment not needed for another 6 M  -     Cardiology; Future    Type 2 diabetes with complication (CMS-HCC)    -Taking 500 mg metformin in AM, 1/2 tablet with dinner, A1c: 7.2  -Pt watching diet, activity as able/tolerated    Dyslipidemia    -Adherent with 10 mg atorvastatin  -Lipid panel improving: TG 171, TC 143, LDL 69    Left foot pain:        - Reviewed x-ray results with patient, aware MRI recommended      - Pt plans to call and schedule    Aware mammogram read still pending, pt will receive letter with results  Reviewed results of bone density scan:03/13/22: lumbar spine: normal bone density, Left hip: decreased bone mass  Aware PCP retiring, planning on establishment with other provider in practice    Barriers to recommended plan: None identified    Return if symptoms worsen or fail to improve, for Scheduled with DR Lee--09/24.      Subjective:     HPI: Cindy Gonzalez is a 82 y.o. female here for Follow-up (Would look to discuss bone density results,mammogram,lab results.).    Follow-up on test results:    Mammogram: had on 03/13/22: read not available yet  Bone density: 03/13/22: lumbar spine: normal bone density, Left hip: decreased bone mass  High cholesterol: taking 10 mg atorvastatin, TG 171, TC 143, LDL 69  DM: Taking 500 mg metformin in AM, 1/2 tablet with dinner, A1c: 7.2    Left foot pain: reviewed diagnostics obtained by provider on 03/20/22:  Pt aware left foot MRI indicated  Diffuse mineralization. No acute fractures identified.       Flattening and bony remodeling of the lateral aspect of the navicular, which can be seen in osteonecrosis. Mild dorsal subluxation of the talonavicular joint.       Mild to moderate multifocal osteoarthritis of the interphalangeal joints.       Hallux valgus metatarsus primus varus with uncovering lateral hallux sesamoid and bunion deformity. Mild lateral subluxation of the first MTP joint. Bipartite medial hallux sesamoid.       Mild to moderate multifocal midfoot osteoarthritis, greatest at the fourth and fifth tarsometatarsal joints. Small calcaneal enthesophytes.       No focal soft tissue swelling nor mineralization.      Hx of CAD, CHF, NICM--last seen by cardiology 03/05/22, due to transport barrier, would prefer local cardiology  Open to transfer when time for cardiology appointment                I have reviewed past medical, surgical, medications, allergies, social and family histories today and updated them in Epic where appropriate.    ROS:       Review of systems negative unless otherwise noted as per HPI.      Objective:     Vitals:    03/22/22 0903   BP: 128/70  Pulse: 70   Temp: 36.8 ??C (98.2 ??F)   SpO2: 96%     Body mass index is 33.69 kg/m??.    Physical Exam  Vitals and nursing note reviewed.   Constitutional:       Appearance: Normal appearance.   Musculoskeletal:      Comments: Walking with one point cane   Skin:     General: Skin is warm and dry.      Capillary Refill: Capillary refill takes less than 2 seconds.   Neurological:      Mental Status: She is alert and oriented to person, place, and time. Mental status is at baseline.   Psychiatric:         Mood and Affect: Mood normal.         Behavior: Behavior normal.         Thought Content: Thought content normal.         Judgment: Judgment normal.            Medication adherence and barriers to the treatment plan have been addressed. Opportunities to optimize healthy behaviors have been discussed. Patient / caregiver voiced understanding.   I personally spent 25 minutes face-to-face and non-face-to-face in the care of this patient, which includes all pre, intra, and post visit time on the date of service.  Cleon Dew, DNP, FNP-C  [facility]  [phone] (F)    Note - This record has been created using [employer]. Chart creation errors have been sought, but may not always have been located. Such creation errors do not reflect on the standard of medical care.

## 2022-03-23 ENCOUNTER — Ambulatory Visit: Admit: 2022-03-23 | Discharge: 2022-03-24 | Payer: MEDICARE

## 2022-03-23 NOTE — Unmapped (Signed)
Hi,     Cindy Gonzalez  contacted the [employer] requesting to speak with the care team of Cindy Gonzalez to discuss:    Patient called with questing and concerns regarding their Hydroxyurea 500 mg medication.    Please contact  Tyshay   at [phone].      Check Indicates criteria has been reviewed and confirmed with the patient:    [x]  Preferred Name   [x]  DOB and/or MR#  [x]  Preferred Contact Method  [x]  Phone Number(s)   []  MyChart     Thank you,   Christell Faith  [facility]   [phone]

## 2022-03-26 NOTE — Unmapped (Signed)
[facility] Refill Coordination Note    Specialty Medication(s) to be Shipped:   Hematology/Oncology: Hydroxyurea    Other medication(s) to be shipped: No additional medications requested for fill at this time     CRESSIDA KAPLIN, DOB: [birth year]  Phone: [phone] (home)       All above HIPAA information was verified with patient.     Was a Nurse, learning disability used for this call? No    Completed refill call assessment today to schedule patient's medication shipment from the [facility] [phone]).  All relevant notes have been reviewed.     Specialty medication(s) and dose(s) confirmed: Regimen is correct and unchanged.   Changes to medications: Rheagan reports no changes at this time.  Changes to insurance: No  New side effects reported not previously addressed with a pharmacist or physician: Yes - Patient reports arthritis flaring up. Patient would not like to speak to the pharmacist today. Their provider is not aware.  Questions for the pharmacist: No    Confirmed patient received a Conservation officer, historic buildings and a Surveyor, mining with first shipment. The patient will receive a drug information handout for each medication shipped and additional FDA Medication Guides as required.       DISEASE/MEDICATION-SPECIFIC INFORMATION        N/A    SPECIALTY MEDICATION ADHERENCE     Medication Adherence    Patient reported X missed doses in the last month: 2  Specialty Medication: Hyrdroxyurea 500 mg  Patient is on additional specialty medications: No  Informant: patient     Were doses missed due to medication being on hold? No    Hyrdroxyurea  500 mg: 14 days of medicine on hand       REFERRAL TO PHARMACIST     Referral to the pharmacist: Not needed      [facility]     Shipping address confirmed in Epic.     Patient was notified of new phone menu : Yes    Delivery Scheduled: Yes, Expected medication delivery date: 04/05/22.     Medication will be delivered via Next Day Courier to the prescription address in Epic [place].    Wyatt Mage M Elisabeth Cara   [facility] Specialty Technician

## 2022-03-26 NOTE — Unmapped (Signed)
Negative mammogram

## 2022-03-27 DIAGNOSIS — E611 Iron deficiency: Principal | ICD-10-CM

## 2022-03-27 DIAGNOSIS — D751 Secondary polycythemia: Principal | ICD-10-CM

## 2022-03-27 DIAGNOSIS — I8290 Acute embolism and thrombosis of unspecified vein: Principal | ICD-10-CM

## 2022-03-27 NOTE — Unmapped (Signed)
Called and spoke with patient's daughter.  Offered appt in clinic tomorrow 3/27 at 3:20.  Daughter will check with her brother and let me know if he an bring the patient.

## 2022-03-27 NOTE — Unmapped (Signed)
Ms Wolsky note pain and swelling in her left leg.  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  Her counts are not controlled.      I will see her tomorrow; labs are in.

## 2022-03-27 NOTE — Unmapped (Signed)
Hi,     Patients daughter Okey Dupre has contacted the Communication Center in regards to the following symptom:     Pain: worsening joint pain    Please contact [name] at [phone]    A page or telephone call has been made to the corresponding clinic.     Thank you,  Roseanne Kaufman   [facility]   [phone]

## 2022-03-27 NOTE — Unmapped (Signed)
[facility] Triage Note     Patient: Cindy Gonzalez     Reason for call:  return call    Time call returned: 1449    Goal for this communication: joint pain continuing to worsen     Phone Assessment: pt daughter calling to report increasing muscle/joint pain that the pt has been experinecing since increasing the hydroxyurea and the pt is convinced it is coming from the increase in medication.  The pt called last week, but no one responded to her.  She is threatening to stop the medication all together due to the pain.  She is alternating 3 different pain medications and it is not easing the pain (meloxicam, tylenol and tramadol).  The pain is so bad that the pt has been unable to stand and cook so she is eating a lot of take out which has [name] concerned.     Triage Recommendations: RN will consult with team and ask that someone call to follow up.

## 2022-03-28 NOTE — Unmapped (Signed)
Hi,    [name] contacted the Communication Center to cancel their appointment for today.  The appointment has been cancelled.    Cancellation Reason: pt states that she is going to wait until her appt on 04/10/22    Thank you,  Rosary Lively  [facility]   [phone]

## 2022-04-04 MED FILL — HYDROXYUREA 500 MG CAPSULE: ORAL | 28 days supply | Qty: 44 | Fill #1

## 2022-04-10 ENCOUNTER — Other Ambulatory Visit: Admit: 2022-04-10 | Discharge: 2022-04-10 | Payer: MEDICARE

## 2022-04-10 ENCOUNTER — Ambulatory Visit: Admit: 2022-04-10 | Discharge: 2022-04-10 | Payer: MEDICARE

## 2022-04-10 ENCOUNTER — Ambulatory Visit
Admit: 2022-04-10 | Discharge: 2022-04-10 | Payer: MEDICARE | Attending: Hematology & Oncology | Primary: Hematology & Oncology

## 2022-04-10 DIAGNOSIS — D751 Secondary polycythemia: Principal | ICD-10-CM

## 2022-04-10 DIAGNOSIS — I8290 Acute embolism and thrombosis of unspecified vein: Principal | ICD-10-CM

## 2022-04-10 DIAGNOSIS — E611 Iron deficiency: Principal | ICD-10-CM

## 2022-04-10 LAB — COMPREHENSIVE METABOLIC PANEL
ALBUMIN: 4.6 g/dL (ref 3.4–5.0)
ALKALINE PHOSPHATASE: 45 U/L — ABNORMAL LOW (ref 46–116)
ALT (SGPT): 21 U/L (ref 10–49)
ANION GAP: 9 mmol/L (ref 5–14)
AST (SGOT): 26 U/L (ref <=34)
BILIRUBIN TOTAL: 0.7 mg/dL (ref 0.3–1.2)
BLOOD UREA NITROGEN: 29 mg/dL — ABNORMAL HIGH (ref 9–23)
BUN / CREAT RATIO: 29
CALCIUM: 10.1 mg/dL (ref 8.7–10.4)
CHLORIDE: 109 mmol/L — ABNORMAL HIGH (ref 98–107)
CO2: 22 mmol/L (ref 20.0–31.0)
CREATININE: 1.01 mg/dL
EGFR CKD-EPI (2021) FEMALE: 55 mL/min/{1.73_m2} — ABNORMAL LOW (ref >=60)
GLUCOSE RANDOM: 115 mg/dL (ref 70–179)
POTASSIUM: 5.6 mmol/L — ABNORMAL HIGH (ref 3.4–4.8)
PROTEIN TOTAL: 7.7 g/dL (ref 5.7–8.2)
SODIUM: 140 mmol/L (ref 135–145)

## 2022-04-10 LAB — CBC W/ AUTO DIFF
BASOPHILS ABSOLUTE COUNT: 0 10*9/L (ref 0.0–0.1)
BASOPHILS RELATIVE PERCENT: 1.1 %
EOSINOPHILS ABSOLUTE COUNT: 0 10*9/L (ref 0.0–0.5)
EOSINOPHILS RELATIVE PERCENT: 0.3 %
HEMATOCRIT: 52.2 % — ABNORMAL HIGH (ref 34.0–44.0)
HEMOGLOBIN: 17.2 g/dL — ABNORMAL HIGH (ref 11.3–14.9)
LYMPHOCYTES ABSOLUTE COUNT: 0.8 10*9/L — ABNORMAL LOW (ref 1.1–3.6)
LYMPHOCYTES RELATIVE PERCENT: 26.7 %
MEAN CORPUSCULAR HEMOGLOBIN CONC: 33 g/dL (ref 32.0–36.0)
MEAN CORPUSCULAR HEMOGLOBIN: 29.7 pg (ref 25.9–32.4)
MEAN CORPUSCULAR VOLUME: 90 fL (ref 77.6–95.7)
MEAN PLATELET VOLUME: 8.2 fL (ref 6.8–10.7)
MONOCYTES ABSOLUTE COUNT: 0.2 10*9/L — ABNORMAL LOW (ref 0.3–0.8)
MONOCYTES RELATIVE PERCENT: 7.6 %
NEUTROPHILS ABSOLUTE COUNT: 1.9 10*9/L (ref 1.8–7.8)
NEUTROPHILS RELATIVE PERCENT: 64.3 %
PLATELET COUNT: 145 10*9/L — ABNORMAL LOW (ref 150–450)
RED BLOOD CELL COUNT: 5.8 10*12/L — ABNORMAL HIGH (ref 3.95–5.13)
RED CELL DISTRIBUTION WIDTH: 21.2 % — ABNORMAL HIGH (ref 12.2–15.2)
WBC ADJUSTED: 3 10*9/L — ABNORMAL LOW (ref 3.6–11.2)

## 2022-04-10 LAB — SLIDE REVIEW

## 2022-04-10 LAB — D-DIMER, QUANTITATIVE: D-DIMER QUANTITATIVE (CW,ML,HL,HS,CH,JS,JC,RX,RH): 472 ng{FEU}/mL (ref <=500)

## 2022-04-10 LAB — FERRITIN: FERRITIN: 265.5 ng/mL

## 2022-04-10 LAB — CK: CREATINE KINASE TOTAL: 67 U/L

## 2022-04-10 MED ORDER — HYDROXYUREA 500 MG CAPSULE
ORAL_CAPSULE | Freq: Two times a day (BID) | ORAL | 3 refills | 66 days | Status: CP
Start: 2022-04-10 — End: ?
  Filled 2022-04-30: qty 132, 66d supply, fill #0

## 2022-04-10 MED ADMIN — sodium chloride 0.9% (NS) bolus 250 mL: 250 mL | INTRAVENOUS | @ 21:00:00 | Stop: 2022-04-10

## 2022-04-10 NOTE — Unmapped (Unsigned)
ID:  Cindy Gonzalez is an 82 y.o. 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  A bone marrow biopsy is not needed to make the diagnosis. She also has an IDH2 and TET2 mutation, conveying worse prognosis.    She has  been on a reasonable dose of Hydrea (5.5 g/wk) for at least 6 weeks and has not had a significant drop in her HCT.  Of note, she has not had an increase in her MCV, which makes me wonder about her adherence.  Her platelets are also higher, which may be a reflection of her adherence or a sign of developing cyclic thrombocytopenia.  She does appear to tolerate the dose that she is taking.      The other curious finding is the increase in her ferritin.  This suggests she is a long way from controlling her counts with phlebotomy.  It makes me somewhat hesitant to continue this practice particularly given her age.  The increase may be due to inflammation though she is not giving me sx of an inflammatory d/o.      We reviewed the reasons for treatment and the plan below.     PLAN:   1) Phlebotomy today.  I will not schedule additional sessions at this time.   2) Hydrea: Increase to 7 g/wk (1 g/day)  3) If not tolerated or not effective, we will work on getting ruxolitinib.   4) Check labs in 4 weeks - We should do a follow-up phone visit after this lab  5) RTC in 8 weeks.     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    04/2020: 3.2/13.7/41.1/225; ANC: 1.9; ALC: 1.0  02/2021: CTA: Negative; B12: 1,251  04/2021: TTE: EF 35-40%; 5.5/14.6/44.3/268; LDH: 234  10/14/21: 8.0/17.7/55.4/265; AMC: 0.1; ALC: 0.5; K: 5.0  10/19/21: JAK2: 64.9; Epo < 1  11/08/21: 5.6/17.8/55.8/243; K 5.3; LDH: 248  02/06/22: Seen by [facility]  7.8/19.5/59.7/262  Smear: No immature forms; nl RBC morphology; smudge cells; reactive lymph   Myeloid mutation panel: IDH2, JAK2, TET2  Ferritin: 27.5  LDH: 399  02/14/22: Started Hydroxyurea 3.5 g/wk  6.5/18.8/58.4/236; MCV: 84.1  02/23/22: Therapeutic phlebotomy   5.9/19.5/60.6/261  Ferritin: 25.3  02/26/22: 6.3/18.4/57.2/262; Hydrea 5.5 g/wk  03/09/22: Phlebotomy;   4.3/17.8/.55.4/153;  Ferritin: 18.3  03/17/22: 5.0/17.7/54.9/114; MCV: 87.5  04/10/22:Phlebotomy; 3.0/17.2/52.2/145; Hydrea 7 g/wk;   MCV: 90.0  ferritin: 265.5    INTERVAL HX:  Cindy Gonzalez comes for an opinion about her PCV.  We discussed the following:   PS: No change to this with starting HU  Arthritis: This is her biggest concern from a mobility and QOL standpoint  MSK: has felt a little more achy the past few days  Heme: She denies any bleeding sx; she has no h/o clots  DM: She denies any sx; her BG has been stable  The remainder of her ROS is given below    MPN 10 Score  (App: [email or web address])   [email or web address]  (Useful for monitoring patient symptoms.    Symptom Range Score   Fatigue in  the past 24 hours (Absent) 0 1 2 3 4 5 6 7 8 9  10 (Worst Imaginable) 0   Filling up quickly when you eat (Early satiety)  (Absent) 0 1 2 3 4 5 6 7 8 9  10 (Worst Imaginable) 0   Abdominal discomfort  (Absent) 0 1 2 3 4 5 6 7 8 9  10 (Worst Imaginable) 0   Inactivity (Absent) 0 1 2 3 4 5 6 7 8 9  10 (Worst Imaginable) Uses walking stick; slowed done arthritis 1   Problems with concentration - (Absent) 0 1 2 3 4 5 6 7 8 9  10 (Worst Imaginable) 0   Numbness/ Tingling (in my hands and feet) (Absent) 0 1 2 3 4 5 6 7 8 9  10 (Worst Imaginable) occ in feet 1 - 2   Night sweats (Absent) 0 1 2 3 4 5 6 7 8 9  10 (Worst Imaginable) She notes a little sweatiness with her cholesterol medicine 1   Itching (pruritus) (Absent) 0 1 2 3 4 5 6 7 8 9  10 (Worst Imaginable) 1   Bone pain (diffuse not joint pain or arthritis) (Absent) 0 1 2 3 4 5 6 7 8 9  10 (Worst Imaginable) It is difficult to separate bone from arthritis pain    Fever (>100 F) (Absent) 0 1 2 3 4 5 6 7 8 9  10 (Daily) 0   Unintentional weight loss last 6 months (Absent) 0 1 2 3 4 5 6 7 8 9  10 (Worst Imaginable) With DM meds 16 lbs      5     PHYSICAL EXAM:  VS: As recorded in Epic  GENERAL: She is in NAD  HEENT: OP is clear; plates  LYMPH NODES:  No LAN  LUNGS: CTA; kyphosis  COR: RRR w/o m/r/g  ABD: NTND; no HSM  EXT: No edema    Primary prophylaxis of thrombosis  All patients: Aspirin unless    VWF activity <30%,   Platelet >?1 million,   CALR-mutated low-risk ET    PV with Hct >?45%: Add phlebotomy/cytoreduction to target Hct <?45%  Age >?60 years and/or prior history of thrombosis: Add cytoreduction  Secondary prophylaxis after thrombotic event  All patients: Cytoreduction  Typical VTE: Consider indefinite VKA for most patients; aspirin if not on VKA  Atypical VTE: Indefinite VKA  Arterial thrombosis: Aspirin    PCV Risk  Age > 60:    1   No: 0   Yes: 1    History of thrombosis:  0   No: 0   Yes: 1    Low Risk: 0 points  High Risk 1 -2 points    Low Risk patients treat with aspirin every day;   High Risk patients treat with aspirin BID*  * Consider BID aspirin for patients with microvascular complications, additional CV risk factors, or leukocytosis        PV:   Major criteria  Hgb/Hct  Men: >?16.5?g/dL or 95% or inc red blood cell mass  Women: > 16 g/dL or 28% or inc red blood cell mass  Inc in RBC Mass > 25%  Presence of JAK2 or JAK2 exon 12 mutation   BM biopsy showing hypercellularity for age with trilineage growth (panmyelosis) including prominent erythroid, granulocytic and megakaryocytic proliferation with pleomorphic, mature megakaryocytes (differences in size, without atypia)     Minor criteria  Subnormal serum erythropoietin level     Diagnosis: All 3 major or first 2 major and 1 minor  Post-PV MF  Required criteria  Previous dx of PCV  BM Fibrosis of grade 2/3    Additional  Anemia or loss of need for cytoreduction  Leukoerythroblastosis  Splenomegaly > 5 cm from baseline or new splenomegaly  2 or more constitutional sx: > 10% wt loss in 6 mths, NS, fever > 37.5 8 9 10  (Worst Imaginable) occ in feet 1 - 2   Night sweats (Absent) 0 1 2 3 4 5 6 7 8 9  10 (Worst Imaginable) She notes a little sweatiness with her cholesterol medicine 1   Itching (pruritus) (Absent) 0 1 2 3 4 5 6 7 8 9  10 (Worst Imaginable) 1   Bone pain (diffuse not joint pain or arthritis) (Absent) 0 1 2 3 4 5 6 7 8 9  10 (Worst Imaginable) It is difficult to separate bone from arthritis pain    Fever (>100 F) (Absent) 0 1 2 3 4 5 6 7 8 9  10 (Daily) 0   Unintentional weight loss last 6 months (Absent) 0 1 2 3 4 5 6 7 8 9  10 (Worst Imaginable) With DM meds 16 lbs      PHYSICAL EXAM:  VS: As recorded in Epic  GENERAL: She is in NAD  HEENT: OP is clear; plates  LYMPH NODES:  No LAN  LUNGS: CTA; kyphosis  COR: RRR w/o m/r/g  ABD: NTND; no HSM  EXT: No edema    Primary prophylaxis of thrombosis  All patients: Aspirin unless    VWF activity <30%,   Platelet >?1 million,   CALR-mutated low-risk ET    PV with Hct >?45%: Add phlebotomy/cytoreduction to target Hct <?45%  Age >?60 years and/or prior history of thrombosis: Add cytoreduction  Secondary prophylaxis after thrombotic event  All patients: Cytoreduction  Typical VTE: Consider indefinite VKA for most patients; aspirin if not on VKA  Atypical VTE: Indefinite VKA  Arterial thrombosis: Aspirin    PCV Risk  Age > 60:    1   No: 0   Yes: 1    History of thrombosis:  0   No: 0   Yes: 1    Low Risk: 0 points  High Risk 1 -2 points    Low Risk patients treat with aspirin every day;   High Risk patients treat with aspirin BID*  * Consider BID aspirin for patients with microvascular complications, additional CV risk factors, or leukocytosis        PV:   Major criteria  Hgb/Hct  Men: >?16.5?g/dL or 16% or inc red blood cell mass  Women: > 16 g/dL or 10% or inc red blood cell mass  Inc in RBC Mass > 25%  Presence of JAK2 or JAK2 exon 12 mutation   BM biopsy showing hypercellularity for age with trilineage growth (panmyelosis) including prominent erythroid, granulocytic and megakaryocytic proliferation with pleomorphic, mature megakaryocytes (differences in size, without atypia)     Minor criteria  Subnormal serum erythropoietin level     Diagnosis: All 3 major or first 2 major and 1 minor    Post-PV MF  Required criteria  Previous dx of PCV  BM Fibrosis of grade 2/3    Additional  Anemia or loss of need for cytoreduction  Leukoerythroblastosis  Splenomegaly > 5 cm from baseline or new splenomegaly  2 or more constitutional sx: > 10% wt loss in 6 mths, NS, fever > 37.5

## 2022-04-10 NOTE — Unmapped (Addendum)
PLAN:   1) Phlebotomy today  2) Hydrea: 1 tablet two times a day  3) If not tolerated, let us know and we will work on [employer].   4) Check labs in 4 weeks  5) See you in 8 weeks.     I would recommend the following:   1) Increase the Hydrea to 1 pill twice a day ... Every day.      2) IF you do not tolerate this dose, we should try Jakafi.    - This is a pill.     - It can cause weight gain and changes in lipids  3) We will do one more phlebotomy to get your hematocrit < 50    Treatments:  1) Hydrea: This stops the cells from dividing and growing.    2) Phlebotomy: This robs the cells of iron.  If there is not enough, there is not red cells.   3) Jakafi: This blocks the mutation. It turns off the JAK2 switch.    4) Interferon: This is a shot that you take every other week.     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      All lab results last 24 hours:    Recent Results (from the past 24 hour(s))   D-Dimer, Quantitative    Collection Time: 04/10/22  1:19 PM   Result Value Ref Range    D-Dimer 472 <=500 ng/mL FEU   CK    Collection Time: 04/10/22  1:19 PM   Result Value Ref Range    Creatine Kinase, Total 67.0 34.0 - 145.0 U/L   Ferritin    Collection Time: 04/10/22  1:19 PM   Result Value Ref Range    Ferritin 265.5 7.3 - 270.7 ng/mL For phlebotomy to work, this number needs to be < 10-20   CBC w/ Differential    Collection Time: 04/10/22  1:19 PM   Result Value Ref Range    WBC 3.0 (L) 3.6 - 11.2 10*9/L    RBC 5.80 (H) 3.95 - 5.13 10*12/L    HGB 17.2 (H) 11.3 - 14.9 g/dL    HCT = hematocrit 16.1 (H) 34.0 - 44.0 % We like this to be < 45. This was as high as 60.   We like this lower because it lowers the risk of stroke and heart attack.     MCV 90.0 77.6 - 95.7 fL    MCH 29.7 25.9 - 32.4 pg    MCHC 33.0 32.0 - 36.0 g/dL    RDW 09.6 (H) 04.5 - 15.2 %    MPV 8.2 6.8 - 10.7 fL    Platelet 145 (L) 150 - 450 10*9/L    Neutrophils % 64.3 %    Lymphocytes % 26.7 %    Monocytes % 7.6 % Eosinophils % 0.3 %    Basophils % 1.1 %    Absolute Neutrophils 1.9 1.8 - 7.8 10*9/L    Absolute Lymphocytes 0.8 (L) 1.1 - 3.6 10*9/L    Absolute Monocytes 0.2 (L) 0.3 - 0.8 10*9/L    Absolute Eosinophils 0.0 0.0 - 0.5 10*9/L    Absolute Basophils 0.0 0.0 - 0.1 10*9/L    Anisocytosis Moderate (A) Not Present   Morphology Review    Collection Time: 04/10/22  1:19 PM   Result Value Ref Range    Smear Review Comments See Comment (A) Undefined

## 2022-04-11 NOTE — Unmapped (Signed)
Clinical Assessment Needed For: Dose Change  Medication: Hydroxyurea  Last Fill Date/Day Supply: 4-3 / 28  Refill Too Soon until 04/23/22  Was previous dose already scheduled to fill: No    Notes to Pharmacist:

## 2022-04-11 NOTE — Unmapped (Signed)
Patient came in for lab check and transfusion. Hemoglobin was 17.2, per therapy plan do a therapeutic phlebotomy and take . . Patient tolerated treatment. Patient receieved bolus. Patient tolerated well. Patient was stable at discharge. Patient self-ambulated with walker to lobby.

## 2022-04-11 NOTE — Unmapped (Signed)
[facility] Pharmacist has reviewed a new prescription for Hyroxyurea that indicates a dose increase.  Patient was counseled on this dosage change by Dr Leotis Pain- see epic note from 04/10/22.  Next refill call date adjusted if necessary.

## 2022-04-11 NOTE — Unmapped (Signed)
Lab on 04/10/2022   Component Date Value Ref Range Status    D-Dimer 04/10/2022 472  <=500 ng/mL FEU Final    Sodium 04/10/2022 140  135 - 145 mmol/L Final    Potassium 04/10/2022 5.6 (H)  3.4 - 4.8 mmol/L Final    Chloride 04/10/2022 109 (H)  98 - 107 mmol/L Final    CO2 04/10/2022 22.0  20.0 - 31.0 mmol/L Final    Anion Gap 04/10/2022 9  5 - 14 mmol/L Final    BUN 04/10/2022 29 (H)  9 - 23 mg/dL Final    Creatinine 16/10/9602 1.01  0.55 - 1.02 mg/dL Final    BUN/Creatinine Ratio 04/10/2022 29   Final    eGFR CKD-EPI (2021) Female 04/10/2022 55 (L)  >=60 mL/min/1.34m2 Final    eGFR calculated with CKD-EPI 2021 equation in accordance with [employer] and [employer] of Nephrology Task Force recommendations.    Glucose 04/10/2022 115  70 - 179 mg/dL Final    Calcium 54/09/8117 10.1  8.7 - 10.4 mg/dL Final    Albumin 14/78/2956 4.6  3.4 - 5.0 g/dL Final    Total Protein 04/10/2022 7.7  5.7 - 8.2 g/dL Final    Total Bilirubin 04/10/2022 0.7  0.3 - 1.2 mg/dL Final    AST 21/30/8657 26  <=34 U/L Final    ALT 04/10/2022 21  10 - 49 U/L Final    Alkaline Phosphatase 04/10/2022 45 (L)  46 - 116 U/L Final    Creatine Kinase, Total 04/10/2022 67.0  34.0 - 145.0 U/L Final    Ferritin 04/10/2022 265.5  7.3 - 270.7 ng/mL Final    WBC 04/10/2022 3.0 (L)  3.6 - 11.2 10*9/L Final    RBC 04/10/2022 5.80 (H)  3.95 - 5.13 10*12/L Final    HGB 04/10/2022 17.2 (H)  11.3 - 14.9 g/dL Final    HCT 84/69/6295 52.2 (H)  34.0 - 44.0 % Final    MCV 04/10/2022 90.0  77.6 - 95.7 fL Final    MCH 04/10/2022 29.7  25.9 - 32.4 pg Final    MCHC 04/10/2022 33.0  32.0 - 36.0 g/dL Final    RDW 28/41/3244 21.2 (H)  12.2 - 15.2 % Final    MPV 04/10/2022 8.2  6.8 - 10.7 fL Final    Platelet 04/10/2022 145 (L)  150 - 450 10*9/L Final    Neutrophils % 04/10/2022 64.3  % Final    Lymphocytes % 04/10/2022 26.7  % Final    Monocytes % 04/10/2022 7.6  % Final    Eosinophils % 04/10/2022 0.3  % Final    Basophils % 04/10/2022 1.1  % Final Absolute Neutrophils 04/10/2022 1.9  1.8 - 7.8 10*9/L Final    Absolute Lymphocytes 04/10/2022 0.8 (L)  1.1 - 3.6 10*9/L Final    Absolute Monocytes 04/10/2022 0.2 (L)  0.3 - 0.8 10*9/L Final    Absolute Eosinophils 04/10/2022 0.0  0.0 - 0.5 10*9/L Final    Absolute Basophils 04/10/2022 0.0  0.0 - 0.1 10*9/L Final    Anisocytosis 04/10/2022 Moderate (A)  Not Present Final    Smear Review Comments 04/10/2022 See Comment (A)  Undefined Final    Slide reviewed.          RED ZONE Means: RED ZONE: Take action now!     You need to be seen right away  Symptoms are at a severe level of discomfort    Call 911 or go to your nearest  Hospital for  help     - Bleeding that will not stop    - Hard to breathe    - New seizure - Chest pain  - Fall or passing out  -Thoughts of hurting    yourself or others      Call 911 if you are going into the RED ZONE                  YELLOW ZONE Means:     Please call with any new or worsening symptom(s), even if not on this list.  Call [phone]  After hours, weekends, and holidays - you will reach a long recording with specific instructions, If not in an emergency such as above, please listen closely all the way to the end and choose the option that relates to your need.   You can be seen by a provider the same day through our Same Day Acute Care for Patients with Cancer program.      YELLOW ZONE: Take action today     Symptoms are new or worsening  You are not within your goal range for:    - Pain    - Shortness of breath    - Bleeding (nose, urine, stool, wound)    - Feeling sick to your stomach and throwing up    - Mouth sores/pain in your mouth or throat    - Hard stool or very loose stools (increase in       ostomy output)    - No urine for 12 hours    - Feeding tube or other catheter/tube issue    - Redness or pain at previous IV or port/catheter site    - Depressed or anxiety   - Swelling (leg, arm, abdomen,     face, neck)  - Skin rash or skin changes  - Wound issues (redness, drainage,    re-opened)  - Confusion  - Vision changes  - Fever >100.4 F or chills  - Worsening cough with mucus that is    green, yellow, or bloody  - Pain or burning when going to the    bathroom  - Home Infusion Pump Issue- call    [phone]         Call your healthcare provider if you are going into the YELLOW ZONE     GREEN ZONE Means:  Your symptoms are under controls  Continue to take your medicine as ordered  Keep all visits to the provider GREEN ZONE: You are in control  No increase or worsening symptoms  Able to take your medicine  Able to drink and eat    - DO NOT use MyChart messages to report red or yellow symptoms. Allow up to 3    business days for a reply.  -MyChart is for non-urgent medication refills, scheduling requests, or other general questions.         [id number] Rev. 06/30/2021  Approved by Oncology Patient Education Committee

## 2022-04-13 NOTE — Unmapped (Signed)
I am concerned that Cindy Gonzalez is not taking her Hydrea.  At 5.5 g/wk, there has been no change in her MCV.  I increased it to 7 g/wk.     I would like to do the following:     PLAN:   1) Phlebotomy on 4/09. I will not schedule additional sessions at this time (given her age)  2) Hydrea: Increased this to 7 g/wk (1 g/day)  3) If not tolerated or not effective, we should go to ruxolitinib.   4) Check labs in 4 weeks - We should do a follow-up phone visit after this lab  Can Cindy Gonzalez or Cindy Basque  do a follow-up visit after these labs?    5) RTC in 8 weeks.     Thanks, H

## 2022-04-16 NOTE — Unmapped (Signed)
[facility] Refill Coordination Note    Specialty Medication(s) to be Shipped:   Hematology/Oncology: Hydroxyurea    Other medication(s) to be shipped: No additional medications requested for fill at this time     Cindy Gonzalez, DOB: 08-Jan-1940  Phone: [phone] (home)       All above HIPAA information was verified with patient.     Was a Nurse, learning disability used for this call? No    Completed refill call assessment today to schedule patient's medication shipment from the [facility] [phone]).  All relevant notes have been reviewed.     Specialty medication(s) and dose(s) confirmed: Patient reports changes to the regimen as follows: 1BID    Changes to medications: Cindy Gonzalez reports no changes at this time.  Changes to insurance: No  New side effects reported not previously addressed with a pharmacist or physician: None reported  Questions for the pharmacist: No    Confirmed patient received a Conservation officer, historic buildings and a Surveyor, mining with first shipment. The patient will receive a drug information handout for each medication shipped and additional FDA Medication Guides as required.       DISEASE/MEDICATION-SPECIFIC INFORMATION        N/A    SPECIALTY MEDICATION ADHERENCE     Medication Adherence    Patient reported X missed doses in the last month: 1  Specialty Medication: Hyrdroxyurea 500 mg  Patient is on additional specialty medications: No  Informant: patient     Were doses missed due to medication being on hold? No    Hyrdroxyurea  500 mg: 19 days of medicine on hand       REFERRAL TO PHARMACIST     Referral to the pharmacist: No - specialty medication dose was changed, however patient has been counseled and pharmacist has reviewed.      SHIPPING     Shipping address confirmed in Epic.     Patient was notified of new phone menu : Yes    Delivery Scheduled: Yes, Expected medication delivery date: 05/01/22.     Medication will be delivered via Next Day Courier to the prescription address in Epic [place].    Cindy Gonzalez Elisabeth Cara   [facility] Specialty Technician

## 2022-04-16 NOTE — Unmapped (Signed)
I spoke with [name] to confirm appointments on the following date(s): 04/19 appointments have been cancel until 05/15 with Dr Aviva Kluver. She is aware of her infusion on the 18 of April and will not see the provider...04/15    [name]

## 2022-04-19 ENCOUNTER — Ambulatory Visit: Admit: 2022-04-19 | Discharge: 2022-04-19 | Payer: MEDICARE

## 2022-04-19 LAB — COMPREHENSIVE METABOLIC PANEL
ALBUMIN: 3.7 g/dL (ref 3.4–5.0)
ALKALINE PHOSPHATASE: 25 U/L — ABNORMAL LOW (ref 46–116)
ANION GAP: 5 mmol/L (ref 5–14)
BILIRUBIN TOTAL: 0.5 mg/dL (ref 0.3–1.2)
BLOOD UREA NITROGEN: 33 mg/dL — ABNORMAL HIGH (ref 9–23)
BUN / CREAT RATIO: 40
CALCIUM: 8.9 mg/dL (ref 8.7–10.4)
CHLORIDE: 109 mmol/L — ABNORMAL HIGH (ref 98–107)
CO2: 24 mmol/L (ref 20.0–31.0)
CREATININE: 0.83 mg/dL
EGFR CKD-EPI (2021) FEMALE: 70 mL/min/{1.73_m2} (ref >=60)
GLUCOSE RANDOM: 127 mg/dL (ref 70–179)
PROTEIN TOTAL: 6.5 g/dL (ref 5.7–8.2)
SODIUM: 138 mmol/L (ref 135–145)

## 2022-04-19 LAB — CBC
HEMATOCRIT: 42.9 % (ref 34.0–44.0)
HEMOGLOBIN: 14.4 g/dL (ref 11.3–14.9)
MEAN CORPUSCULAR HEMOGLOBIN CONC: 33.5 g/dL (ref 32.0–36.0)
MEAN CORPUSCULAR HEMOGLOBIN: 30.3 pg (ref 25.9–32.4)
MEAN CORPUSCULAR VOLUME: 90.3 fL (ref 77.6–95.7)
MEAN PLATELET VOLUME: 8.1 fL (ref 6.8–10.7)
PLATELET COUNT: 158 10*9/L (ref 150–450)
RED BLOOD CELL COUNT: 4.74 10*12/L (ref 3.95–5.13)
RED CELL DISTRIBUTION WIDTH: 22 % — ABNORMAL HIGH (ref 12.2–15.2)
WBC ADJUSTED: 2.7 10*9/L — ABNORMAL LOW (ref 3.6–11.2)

## 2022-04-19 LAB — IRON & TIBC: TOTAL IRON BINDING CAPACITY: 308 ug/dL (ref 250–425)

## 2022-04-19 LAB — FERRITIN: FERRITIN: 253.6 ng/mL

## 2022-04-19 NOTE — Unmapped (Signed)
Patient came in for lab check and possible therapeutic phlebotomy. Patients Hemoglobin was 14.4 and Hematocrit was 42.9, Per therapy plan hold therapeutic phlebotomy. Patient was stable at time of discharge. Patient self-ambulated with walker to the lobby.

## 2022-04-19 NOTE — Unmapped (Signed)
Hospital Outpatient Visit on 04/19/2022   Component Date Value Ref Range Status    WBC 04/19/2022 2.7 (L)  3.6 - 11.2 10*9/L Final    RBC 04/19/2022 4.74  3.95 - 5.13 10*12/L Final    HGB 04/19/2022 14.4  11.3 - 14.9 g/dL Final    HCT 16/10/9602 42.9  34.0 - 44.0 % Final    MCV 04/19/2022 90.3  77.6 - 95.7 fL Final    MCH 04/19/2022 30.3  25.9 - 32.4 pg Final    MCHC 04/19/2022 33.5  32.0 - 36.0 g/dL Final    RDW 54/09/8117 22.0 (H)  12.2 - 15.2 % Final    MPV 04/19/2022 8.1  6.8 - 10.7 fL Final    Platelet 04/19/2022 158  150 - 450 10*9/L Final          RED ZONE Means: RED ZONE: Take action now!     You need to be seen right away  Symptoms are at a severe level of discomfort    Call 911 or go to your nearest  Hospital for help     - Bleeding that will not stop    - Hard to breathe    - New seizure - Chest pain  - Fall or passing out  -Thoughts of hurting    yourself or others      Call 911 if you are going into the RED ZONE                  YELLOW ZONE Means:     Please call with any new or worsening symptom(s), even if not on this list.  Call [phone]  After hours, weekends, and holidays - you will reach a long recording with specific instructions, If not in an emergency such as above, please listen closely all the way to the end and choose the option that relates to your need.   You can be seen by a provider the same day through our Same Day Acute Care for Patients with Cancer program.      YELLOW ZONE: Take action today     Symptoms are new or worsening  You are not within your goal range for:    - Pain    - Shortness of breath    - Bleeding (nose, urine, stool, wound)    - Feeling sick to your stomach and throwing up    - Mouth sores/pain in your mouth or throat    - Hard stool or very loose stools (increase in       ostomy output)    - No urine for 12 hours    - Feeding tube or other catheter/tube issue    - Redness or pain at previous IV or port/catheter site    - Depressed or anxiety   - Swelling (leg, arm, abdomen,     face, neck)  - Skin rash or skin changes  - Wound issues (redness, drainage,    re-opened)  - Confusion  - Vision changes  - Fever >100.4 F or chills  - Worsening cough with mucus that is    green, yellow, or bloody  - Pain or burning when going to the    bathroom  - Home Infusion Pump Issue- call    [phone]         Call your healthcare provider if you are going into the YELLOW ZONE     GREEN ZONE Means:  Your symptoms are under controls  Continue to take  your medicine as ordered  Keep all visits to the provider GREEN ZONE: You are in control  No increase or worsening symptoms  Able to take your medicine  Able to drink and eat    - DO NOT use MyChart messages to report red or yellow symptoms. Allow up to 3    business days for a reply.  -MyChart is for non-urgent medication refills, scheduling requests, or other general questions.         [id number] Rev. 06/30/2021  Approved by Oncology Patient Education Committee

## 2022-04-20 DIAGNOSIS — G4700 Insomnia, unspecified: Principal | ICD-10-CM

## 2022-04-20 DIAGNOSIS — G250 Essential tremor: Principal | ICD-10-CM

## 2022-04-20 MED ORDER — LORAZEPAM 0.5 MG TABLET
ORAL_TABLET | 0 refills | 0 days | Status: CP
Start: 2022-04-20 — End: ?

## 2022-04-26 DIAGNOSIS — I5032 Chronic diastolic (congestive) heart failure: Principal | ICD-10-CM

## 2022-04-26 NOTE — Unmapped (Signed)
Patient called in stating that she is needing to have PCP send in prescription for: Oxygen concentrator supplies.     Pharmacy: [facility]     [address]  [place], [place] [address]    Phone number: [phone]    Please see if possible to assist.

## 2022-04-26 NOTE — Unmapped (Signed)
Addended by: Barbaraann Boys on: 04/26/2022 11:02 AM     Modules accepted: Orders

## 2022-05-02 NOTE — Unmapped (Signed)
Patient called back stating she hasn't received her oxygen supplies yet. Patient would like a call back from the nurse. Please contact patient by cellphone.

## 2022-05-03 NOTE — Unmapped (Signed)
Pt called about about oxygen supplies, states that she hasn't received a call and would like someone to call her regarding this

## 2022-05-03 NOTE — Unmapped (Signed)
[facility] , they stated Pt needed to call them.Called Pt and gave # to call to get her supplies

## 2022-05-07 DIAGNOSIS — M2142 Flat foot [pes planus] (acquired), left foot: Principal | ICD-10-CM

## 2022-05-07 DIAGNOSIS — M19072 Primary osteoarthritis, left ankle and foot: Principal | ICD-10-CM

## 2022-05-07 DIAGNOSIS — M25472 Effusion, left ankle: Principal | ICD-10-CM

## 2022-05-09 ENCOUNTER — Ambulatory Visit: Admit: 2022-05-09 | Discharge: 2022-05-10 | Payer: MEDICARE

## 2022-05-18 ENCOUNTER — Ambulatory Visit: Admit: 2022-05-18 | Discharge: 2022-05-19 | Payer: MEDICARE | Attending: Family | Primary: Family

## 2022-05-18 DIAGNOSIS — J0100 Acute maxillary sinusitis, unspecified: Principal | ICD-10-CM

## 2022-05-18 MED ORDER — AZITHROMYCIN 250 MG TABLET
ORAL_TABLET | 0 refills | 0 days | Status: CP
Start: 2022-05-18 — End: ?

## 2022-05-18 NOTE — Unmapped (Signed)
[facility] Note:  [place], [place] [address]. Phone [phone]    05/18/2022    Patient Name:   Cindy Gonzalez    MRN: [id number]    Demographics:    Age-  82 y.o.     Date of Birth-  12-Dec-1940    Chief complaint (CC):    Chief Complaint   Patient presents with    Congestion     Sinus congestion that drains into throat and causes cough.  Duration of 4 days, patient using albuterol inhaler and Mucinex, nasal spray.        Assessment/Plan:    Brianna is a pleasant 82 y.o. female with PMHx of pancreatitis, hepatitis, T2DM, atelectasis, acute respiratory failure with hypoxemia, nonischemic cardiomyopathy, HTN, aortic stenosis, heart failure, CAD, essential tremor, osteoarthritis, vertigo, iron deficiency anemia, insomnia, dyslipidemia, Diffuse large B cell lymphoma. Primary concern today regarding x4 days of persistent rhinitis with postnasal drip. PE notable for mild nasal congestion, otherwise unremarkable. Lungs clear to auscultation bilaterally. Given chronicity of symptoms and patient's significant concern surrounding development of secondary infection will Tx w/ x3 days of azithromycin 250 mg, Rx provided after thorough review of potential benefits vs risks. Symptomatic management discussed, ER precautions verbalized. Follow up if symptoms fail to improve or worsen.     Diagnosis ICD-10-CM Associated Orders   1. Acute non-recurrent maxillary sinusitis  J01.00 azithromycin (ZITHROMAX) 250 MG tablet          30 minutes of clinical time > 1/2 the office visit was face to face time. We discussed medical, dietary, lifestyle, and health maintenance modifications to optimize health. Standard precautions followed during visit. Medication adherence and barriers to the treatment plan have been addressed.  Patient voiced understanding, needs F/U visit if symptoms fail to improve or worsen.    2.  Health Maintenance:   Health Maintenance Due   Topic Date Due    Zoster Vaccines (1 of 2) Never done COVID-19 Vaccine (4 - 2023-24 season) 09/01/2021    Retinal Eye Exam  03/02/2022     Subjective:    History of present illness (HPI):       AITANNA KITZINGER is a 82 y.o. female who presented to [facility] for evaluation regarding nasal congestion. Patient reports x4 days of persistent nasal congestion and postnasal drip. Has been utilizing her albuterol rescue inhaler, Flonase and OTC Mucinex with limited benefit. Denies any known sick contacts, does play bingo most Tuesday and Friday nights. Patient's medical history was reviewed, see Past Medical History. Medications used in the past were reviewed, see medications. Patient reports eating and eliminating well. Pt is sleeping well. Patient has not required emergency room treatment for these symptoms, and has not required hospitalization.     Denies HA, fever, chest pain, shortness of breath, N/V/D, bowel or bladder issues, vision or hearing changes, or swelling.     Relevant ROS: Reviewed 12 systems, positive findings listed, all others negative.    Pertinent Past Med Hx:    Past Medical History:   Diagnosis Date    At risk for falls     left ankle arthritis; cane     Coronary artery disease     Diabetes mellitus (CMS-HCC)     Diffuse large B cell lymphoma (CMS-HCC)     Hypertension     Impaired mobility     left ankle arthritis, cane    Visual impairment     glasses  Medications:       Current Outpatient Medications:     albuterol HFA 90 mcg/actuation inhaler, Inhale 2 puffs every six (6) hours as needed for wheezing., Disp: 18 g, Rfl: 0    amlodipine (NORVASC) 10 MG tablet, Take 1 tablet (10 mg total) by mouth daily., Disp: 90 tablet, Rfl: 3    aspirin (ECOTRIN) 81 MG tablet, Take 1 tablet (81 mg total) by mouth daily., Disp: 30 tablet, Rfl: 11    atorvastatin (LIPITOR) 10 MG tablet, Take 1 tablet (10 mg total) by mouth daily., Disp: 90 tablet, Rfl: 3    blood sugar diagnostic (ACCU-CHEK GUIDE TEST STRIPS) Strp, by Other route Two (2) times a day (30 minutes before a meal)., Disp: 200 each, Rfl: 1    blood-glucose meter kit, Use as instructed, Disp: 1 each, Rfl: 0    calcium-vitamin D 500 mg(1,250mg ) -200 unit per tablet, Take 1 tablet by mouth in the morning and 1 tablet in the evening. Take with meals., Disp: , Rfl:     estradiol (ESTRACE) 0.01 % (0.1 mg/gram) vaginal cream, Insert 2 g into the vagina daily. Use externally prn, Disp: 42.5 g, Rfl: 11    fenofibrate (TRICOR) 145 MG tablet, Take 1 tablet (145 mg total) by mouth daily., Disp: 90 tablet, Rfl: 3    fluticasone propionate (FLONASE) 50 mcg/actuation nasal spray, 1 spray into each nostril daily., Disp: 16 g, Rfl: 11    fluticasone propionate (FLONASE) 50 mcg/actuation nasal spray, 1 spray into each nostril daily., Disp: 16 g, Rfl: 11    furosemide (LASIX) 20 MG tablet, TAKE 1 TABLET BY MOUTH THREE TIMES A WEEK, Disp: 36 tablet, Rfl: 0    hydroxyurea (HYDREA) 500 mg capsule, Take 1 capsule (500 mg total) by mouth two (2) times a day., Disp: 132 capsule, Rfl: 3    lancets (ACCU-CHEK SOFTCLIX LANCETS) Misc, Use as directed to check glucose twice per day, Disp: 200 each, Rfl: 5    lisinopril (PRINIVIL,ZESTRIL) 30 MG tablet, Take 1 tablet (30 mg total) by mouth daily., Disp: 90 tablet, Rfl: 3    LORazepam (ATIVAN) 0.5 MG tablet, TAKE 1/2 (ONE-HALF) TABLET BY MOUTH TWICE DAILY AS NEEDED FOR ANXIETY, Disp: 30 tablet, Rfl: 0    meloxicam (MOBIC) 7.5 MG tablet, Take 1 tablet (7.5 mg total) by mouth daily., Disp: 90 tablet, Rfl: 1    metFORMIN (GLUCOPHAGE) 500 MG tablet, 1 tablet in the AM and 1/2 tab with dinner daily, Disp: 180 tablet, Rfl: 3    metoPROLOL succinate (TOPROL-XL) 50 MG 24 hr tablet, TAKE 1 TABLET BY MOUTH AT BEDTIME, Disp: 90 tablet, Rfl: 3    RESTASIS 0.05 % ophthalmic emulsion, INSTILL 1 DROP INTO EACH EYE TWICE DAILY, Disp: , Rfl:     spironolactone (ALDACTONE) 25 MG tablet, Take 1 tablet (25 mg total) by mouth daily., Disp: 90 tablet, Rfl: 3    azithromycin (ZITHROMAX) 250 MG tablet, 1 tablet daily for 3 days., Disp: 3 tablet, Rfl: 0    cyanocobalamin 2000 MCG tablet, Take 1 tablet (2,000 mcg total) by mouth daily. (Patient not taking: Reported on 04/10/2022), Disp: , Rfl:     fluocinolone acetonide oil 0.01 % Drop, Administer 1 drop into the left ear two (2) times a day. (Patient not taking: Reported on 04/10/2022), Disp: 20 mL, Rfl: 0    ketoconazole (NIZORAL) 2 % cream, APPLY  CREAM TOPICALLY ONCE DAILY FOR SCALP RASH, Disp: 30 g, Rfl: 0    lancing device Misc, USE  TO CHECK BLOOD SUGAR 2 TIMES A DAY, Disp: 100 each, Rfl: 2    mupirocin (BACTROBAN) 2 % ointment, Apply 1 Application topically Two (2) times a day (at 8am and 12:00). (Patient not taking: Reported on 04/10/2022), Disp: , Rfl:     traMADoL (ULTRAM) 50 mg tablet, Take 1 tablet (50 mg total) by mouth every six (6) hours as needed for pain. (Patient not taking: Reported on 04/10/2022), Disp: 30 tablet, Rfl: 0     Allergies:   Allergies   Allergen Reactions    Amoxicillin      Other Reaction(s): Unknown    Cephalexin Palpitations    Ciprofloxacin Palpitations     Legacy System: Abran Cantor    Onset Date: <blank>    Substance Legacy/Cerner: Cipro / Cipro (Legacy value)    Category: Drug    Severity Legacy/Cerner: <blank> / Unknown    Reaction(s): makes head feel funny  nausea    Comments: <blank>    Hydrocodone-Acetaminophen Nausea And Vomiting and Other (See Comments)    Sulfamethoxazole Palpitations       Pertinent Social Hx and Habits: EMR reviewed    Pertinent Family Hx: EMR reviewed    Objective:      BP Readings from Last 3 Encounters:   05/18/22 128/70   04/19/22 162/73   04/10/22 135/67        Vitals:    05/18/22 0816   BP: 128/70   BP Site: L Arm   BP Position: Sitting   Pulse: 76   SpO2: 97%   Weight: 78.5 kg (173 lb)   Height: 157.5 cm (5' 2)        Physical Exam:    General Appearance: WDWN in NAD      Skin: W, D, I  HEENT: PERRLA, EOMI, TM's clear. Mild nasal congestion noted.  Respiratory: Clear throughout  Cardio: RRR  Abdomen: Soft and non-tnder  Neurologic: A & O X 4, Grossly intact, stable gait  PSYCH: Behavior calm and cooperative    Diagnostics:     Lab Results   Component Value Date    WBC 2.7 (L) 04/19/2022    HGB 14.4 04/19/2022    HCT 42.9 04/19/2022    PLT 158 04/19/2022       Lab Results   Component Value Date    NA 138 04/19/2022    K  04/19/2022      Comment:      Specimen hemolyzed.    CL 109 (H) 04/19/2022    CO2 24.0 04/19/2022    BUN 33 (H) 04/19/2022    CREATININE 0.83 04/19/2022    GLU 127 04/19/2022    CALCIUM 8.9 04/19/2022    MG 1.7 02/26/2022    PHOS 2.4 07/23/2019       Lab Results   Component Value Date    BILITOT 0.5 04/19/2022    BILIDIR 2.20 (H) 07/21/2019    PROT 6.5 04/19/2022    ALBUMIN 3.7 04/19/2022    ALT  04/19/2022      Comment:      Specimen hemolyzed.    AST  04/19/2022      Comment:      Specimen hemolyzed.    ALKPHOS 25 (L) 04/19/2022    GGT 310 (H) 07/20/2019       Lab Results   Component Value Date    PT 13.4 07/21/2019    INR 1.13 07/21/2019        TSH   Date Value Ref Range  Status   03/06/2022 2.006 0.550 - 4.780 uIU/mL Final   10/25/2020 3.363 0.550 - 4.780 uIU/mL Final        I attest that I, Benita Stabile, personally documented this note while acting as scribe for Olena Leatherwood, NP.      Benita Stabile, Scribe.  05/18/2022     The documentation recorded by the scribe accurately reflects the service I personally performed and the decisions made by me.     Olena Leatherwood, NP    Dr. Betha Loa, DNP, FNP-BC  [facility] Certified Doctor of Nursing Practice   [phone]

## 2022-05-24 ENCOUNTER — Ambulatory Visit
Admission: EM | Admit: 2022-05-24 | Discharge: 2022-05-24 | Disposition: A | Discharge status: Home / Self Care | Payer: Medicare HMO | Attending: Emergency Medicine | Admitting: Emergency Medicine

## 2022-05-24 DIAGNOSIS — W57XXXA Bitten or stung by nonvenomous insect and other nonvenomous arthropods, initial encounter: Secondary | ICD-10-CM

## 2022-05-24 DIAGNOSIS — L03211 Cellulitis of face: Secondary | ICD-10-CM

## 2022-05-24 DIAGNOSIS — S0086XA Insect bite (nonvenomous) of other part of head, initial encounter: Secondary | ICD-10-CM

## 2022-05-24 MED ORDER — DOXYCYCLINE HYCLATE 100 MG PO CAPS: 100.0000 mg | ORAL_CAPSULE | Freq: Two times a day (BID) | ORAL | 0 refills | Status: AC

## 2022-05-24 NOTE — Unmapped (Signed)
Pt called back stating she is going on over to Urgent Care. Nurse does not have to call her back

## 2022-05-24 NOTE — Unmapped (Signed)
Regarding: swollen eye/face [facility] -RN not available at time of call  ----- Message from Lenis Dickinson sent at 05/24/2022  8:59 AM EDT -----  Swollen eye from bug bite/nothing available in pcp's clinic until 5/28/pt stated that she will check out the urgent care near her area

## 2022-05-24 NOTE — Unmapped (Signed)
Upcoming Appt:  Future Appointments   Date Time Provider Department Center   06/12/2022  3:00 PM ADULT ONC LAB [facility]   06/12/2022  4:00 PM Halford Decamp, MD [id number]   06/14/2022 11:20 AM Jenell Milliner, MD [facility]   08/16/2022  8:30 AM ADULT ONC LAB [facility]   08/16/2022  9:30 AM Darel Hong, PA [id number]   10/02/2022  4:20 PM Ardeth Sportsman, MD [facility]       Recent:   What is the date of your last related visit?  na  Related acute medications Rx'd:  na  Home treatment tried:  na      Relevant:   Allergies: Amoxicillin, Cephalexin, Ciprofloxacin, Hydrocodone-acetaminophen, and Sulfamethoxazole  Medications: na   Health History: na  Weight: na      [place] Cancer patients only:  What was the date of your last cancer treatment (mm/dd/yy)?: na  Was the treatment oral or infusion?: na  Are you currently on TVEC (yes/no)?: na  Reason for Disposition   Caller requesting an appointment, triage offered and declined     Patient has spoken to PCP office and they told her to go to UC, she is on her way there now.    Answer Assessment - Initial Assessment Questions  1. REASON FOR CALL or QUESTION: What is your reason for calling today? or How can I best  help you? or What question do you have that I can help answer?      Face and eye swelling  2. CALLER: Document the source of call. (e.g., laboratory, patient).      Leslie Dales    Protocols used: PCP Call - No Triage-A-AH

## 2022-05-24 NOTE — ED Provider Notes (Signed)
[facility]    CSN: [id number] Arrival date & time: 05/24/22  0919      History   Chief Complaint Chief Complaint  Patient presents with   Insect Bite    HPI Jacqueline Arroyo is a 82 y.o. female.   HPI  82 year old female with a past medical history of diabetes and hypertension presents for evaluation of swelling and redness to her right forehead and temple.  She reports that the rednes and swelling started 2 days ago after being bitten by an insect.  She reports she did not see what bit her.  She denies any fever, drainage, or changes in vision.  Past Medical History:  Diagnosis Date   Diabetes mellitus without complication (HCC)    Hypertension     There are no problems to display for this patient.   Past Surgical History:  Procedure Laterality Date   ABDOMINAL HYSTERECTOMY     BACK SURGERY     CHOLECYSTECTOMY     FOOT SURGERY      OB History   No obstetric history on file.      Home Medications    Prior to Admission medications   Medication Sig Start Date End Date Taking? Authorizing Provider  doxycycline (VIBRAMYCIN) 100 MG capsule Take 1 capsule (100 mg total) by mouth 2 (two) times daily for 5 days. 05/24/22 05/29/22 Yes Becky Augusta, NP  acetic acid 2 % otic solution SMARTSIG:Left Ear 01/19/21   [provider]  albuterol (VENTOLIN HFA) 108 (90 Base) MCG/ACT inhaler Inhale into the lungs. 07/12/19   [provider]  amLODipine (NORVASC) 10 MG tablet Take 10 mg by mouth daily. 09/01/20   [provider]  aspirin 81 MG EC tablet Take by mouth. 07/17/19   [provider]  atorvastatin (LIPITOR) 10 MG tablet Take 1 tablet by mouth daily. 03/01/20   [provider]  calcium-vitamin D (OSCAL WITH D) 500-200 MG-UNIT TABS tablet Take by mouth.    [provider]  Cyanocobalamin 2000 MCG TBCR Take by mouth.    [provider]  cycloSPORINE (RESTASIS) 0.05 % ophthalmic emulsion INSTILL 1 DROP INTO  EACH EYE TWICE DAILY 01/29/20   [provider]  estradiol (ESTRACE) 0.1 MG/GM vaginal cream SMARTSIG:Gram(s) Vaginal 3 Times a Week 08/17/20   [provider]  fenofibrate (TRICOR) 145 MG tablet Take by mouth. 06/27/20   [provider]  fexofenadine (ALLEGRA) 180 MG tablet Take 1 tablet (180 mg total) by mouth daily. 09/12/20   Rodriguez-Southworth, Viviana Simpler  FLOVENT HFA 110 MCG/ACT inhaler SMARTSIG:2 Puff(s) By Mouth Twice Daily 05/09/20   [provider]  fluticasone (FLONASE) 50 MCG/ACT nasal spray 1 spray into each nostril daily. 12/27/20 12/27/21  [provider]  furosemide (LASIX) 20 MG tablet Take by mouth. 07/23/19   [provider]  hydroxyurea (HYDREA) 500 MG capsule Take by mouth. 02/26/22   [provider]  ketoconazole (NIZORAL) 2 % cream Apply topically daily. 03/11/20   [provider]  Lancets (FREESTYLE) lancets Check blood sugar twice daily 12/27/20   [provider]  lisinopril (ZESTRIL) 30 MG tablet Take 1 tablet by mouth daily. 12/29/20 12/29/21  [provider]  LORazepam (ATIVAN) 0.5 MG tablet Take by mouth. 07/31/19   [provider]  meclizine (ANTIVERT) 12.5 MG tablet Take 12.5 mg by mouth 2 (two) times daily as needed. 01/18/21   [provider]  meloxicam (MOBIC) 7.5 MG tablet Take 1 tablet by mouth daily. 01/26/20  [provider]  metFORMIN (GLUCOPHAGE) 500 MG tablet Take 500 mg by mouth daily. 08/31/20   [provider]  methocarbamol (ROBAXIN) 500 MG tablet Take 1 tablet (500 mg total) by mouth 2 (two) times daily. 01/14/22   Katha Cabal, DO  metoprolol succinate (TOPROL-XL) 50 MG 24 hr tablet Take 50 mg by mouth 2 (two) times daily. 09/08/19   [provider]  spironolactone (ALDACTONE) 25 MG tablet Take 25 mg by mouth daily. 08/06/20   [provider]  UNABLE TO FIND Med Name: HCTZ 10 mg    [provider]    Family  History History reviewed. No pertinent family history.  Social History Social History   Tobacco Use   Smoking status: Never   Smokeless tobacco: Never  Vaping Use   Vaping Use: Never used  Substance Use Topics   Alcohol use: Never   Drug use: Never     Allergies   Amoxicillin, Ciprofloxacin, Keflex [cephalexin], Hydrocodone-acetaminophen, and Sulfamethoxazole   Review of Systems Review of Systems  Constitutional:  Negative for fever.  HENT:  Positive for facial swelling.   Skin:  Positive for color change.     Physical Exam Triage Vital Signs ED Triage Vitals  Enc Vitals Group     BP      Pulse      Resp      Temp      Temp src      SpO2      Weight      Height      Head Circumference      Peak Flow      Pain Score      Pain Loc      Pain Edu?      Excl. in GC?    No data found.  Updated Vital Signs BP (!) 152/77 (BP Location: Left Arm)   Pulse 91   Temp 98.2 F (36.8 C) (Oral)   SpO2 95%   Visual Acuity Right Eye Distance:   Left Eye Distance:   Bilateral Distance:    Right Eye Near:   Left Eye Near:    Bilateral Near:     Physical Exam Vitals and nursing note reviewed.  Constitutional:      Appearance: Normal appearance. She is not ill-appearing.  HENT:     Head: Normocephalic and atraumatic.  Skin:    General: Skin is warm and dry.     Capillary Refill: Capillary refill takes less than 2 seconds.     Findings: Erythema present.  Neurological:     General: No focal deficit present.     Mental Status: She is alert and oriented to person, place, and time.      UC Treatments / Results  Labs (all labs ordered are listed, but only abnormal results are displayed) Labs Reviewed - No data to display  EKG   Radiology No results found.  Procedures Procedures (including critical care time)  Medications Ordered in UC Medications - No data to display  Initial Impression / Assessment and Plan / UC Course  I have reviewed the  triage vital signs and the nursing notes.  Pertinent labs & imaging results that were available during my care of the patient were reviewed by me and considered in my medical decision making (see chart for details).   Patient is a nontoxic-appearing 1 old female who is presenting for evaluation of redness and swelling to her right forehead and right cheek after being bitten  by an insect 2 days ago.    As you can see in images above, there is erythema with very mild edema.  The redness is warm to touch.  I cannot find an envenomation site and there is no fluctuance or induration.  I suspect that the patient has facial cellulitis, most likely secondary to the insect bite.  She reports multiple intolerances to antibiotics but doxycycline is not listed as one of them.  When I discussed this with the patient she was not sure if she had issues with this in the past or not but nothing is documented in epic.  We agreed to a trial of twice daily dosing of 100 mg for 5-day course to treat the cellulitis.  She is to use over-the-counter Benadryl cream to help with itching.  If her symptoms worsen she needs to present to the ER.   Final Clinical Impressions(s) / UC Diagnoses   Final diagnoses:  Facial cellulitis  Insect bite of other part of head, initial encounter     Discharge Instructions      Take the Doxycycline twice daily with food for 5 days.  Doxycycline will make you more sensitive to sunburn so wear sunscreen when outdoors and reapply it every 90 minutes.  Use topical Benadryl cream as needed for itching. You can apply it every 6 hours as needed.   Use OTC Tylenol and Ibuprofen according to the package instructions as needed for pain.  Return for new or worsening symptoms.       ED Prescriptions     Medication Sig Dispense Auth. Provider   doxycycline (VIBRAMYCIN) 100 MG capsule Take 1 capsule (100 mg total) by mouth 2 (two) times daily for 5 days. 10 capsule Becky Augusta, NP       PDMP not reviewed this encounter.   Becky Augusta, NP 05/24/22 1011

## 2022-05-24 NOTE — Discharge Instructions (Signed)
Take the Doxycycline twice daily with food for 5 days.  Doxycycline will make you more sensitive to sunburn so wear sunscreen when outdoors and reapply it every 90 minutes.  Use topical Benadryl cream as needed for itching. You can apply it every 6 hours as needed.   Use OTC Tylenol and Ibuprofen according to the package instructions as needed for pain.  Return for new or worsening symptoms.

## 2022-05-24 NOTE — ED Triage Notes (Signed)
Pt present to UC for insect bite on RT side of forehead x2 days. Pt states she took some Childrens benadryl and applied some topical ointment on bite.

## 2022-05-29 DIAGNOSIS — I10 Essential (primary) hypertension: Principal | ICD-10-CM

## 2022-05-29 DIAGNOSIS — G250 Essential tremor: Principal | ICD-10-CM

## 2022-05-29 DIAGNOSIS — G4700 Insomnia, unspecified: Principal | ICD-10-CM

## 2022-05-29 MED ORDER — FUROSEMIDE 20 MG TABLET
ORAL_TABLET | 0 refills | 0 days | Status: CP
Start: 2022-05-29 — End: ?

## 2022-05-29 MED ORDER — LORAZEPAM 0.5 MG TABLET
ORAL_TABLET | 0 refills | 0 days | Status: CP
Start: 2022-05-29 — End: ?

## 2022-05-30 NOTE — Unmapped (Signed)
Hi,     [name] contacted the [employer] requesting to speak with the care team of Cindy Gonzalez to discuss:    -that her Primary Care informed her that they received the lab orders but they can not do the labs because the provider is not in their system. Tria expressed that  maybe she could go to another local location or come to [facility] or [facility].     Please contact [facility]  at [phone].    [x]  Preferred Name   [x]  DOB and/or MR#  [x]  Preferred Contact Method  [x]  Phone Number(s)   [x]  MyChart     Thank you,   Durward Fortes  [facility]   [phone]

## 2022-05-31 ENCOUNTER — Ambulatory Visit: Admit: 2022-05-31 | Discharge: 2022-06-01 | Payer: MEDICARE

## 2022-05-31 LAB — COMPREHENSIVE METABOLIC PANEL
ALBUMIN: 4.1 g/dL (ref 3.4–5.0)
ALKALINE PHOSPHATASE: 41 U/L — ABNORMAL LOW (ref 46–116)
ALT (SGPT): 19 U/L (ref 10–49)
ANION GAP: 8 mmol/L (ref 5–14)
AST (SGOT): 21 U/L (ref <=34)
BILIRUBIN TOTAL: 0.6 mg/dL (ref 0.3–1.2)
BLOOD UREA NITROGEN: 34 mg/dL — ABNORMAL HIGH (ref 9–23)
BUN / CREAT RATIO: 33
CALCIUM: 9.7 mg/dL (ref 8.7–10.4)
CHLORIDE: 112 mmol/L — ABNORMAL HIGH (ref 98–107)
CO2: 24.5 mmol/L (ref 20.0–31.0)
CREATININE: 1.03 mg/dL — ABNORMAL HIGH
EGFR CKD-EPI (2021) FEMALE: 54 mL/min/{1.73_m2} — ABNORMAL LOW (ref >=60)
GLUCOSE RANDOM: 122 mg/dL (ref 70–179)
POTASSIUM: 4.8 mmol/L (ref 3.4–4.8)
PROTEIN TOTAL: 7.2 g/dL (ref 5.7–8.2)
SODIUM: 144 mmol/L (ref 135–145)

## 2022-05-31 LAB — CBC W/ AUTO DIFF
BASOPHILS ABSOLUTE COUNT: 0 10*9/L (ref 0.0–0.1)
BASOPHILS RELATIVE PERCENT: 1 %
EOSINOPHILS ABSOLUTE COUNT: 0 10*9/L (ref 0.0–0.5)
EOSINOPHILS RELATIVE PERCENT: 0.3 %
HEMATOCRIT: 37.7 % (ref 34.0–44.0)
HEMOGLOBIN: 12.7 g/dL (ref 11.3–14.9)
LYMPHOCYTES ABSOLUTE COUNT: 1.2 10*9/L (ref 1.1–3.6)
LYMPHOCYTES RELATIVE PERCENT: 37.5 %
MEAN CORPUSCULAR HEMOGLOBIN CONC: 33.7 g/dL (ref 32.0–36.0)
MEAN CORPUSCULAR HEMOGLOBIN: 34.9 pg — ABNORMAL HIGH (ref 25.9–32.4)
MEAN CORPUSCULAR VOLUME: 103.5 fL — ABNORMAL HIGH (ref 77.6–95.7)
MEAN PLATELET VOLUME: 7.4 fL (ref 6.8–10.7)
MONOCYTES ABSOLUTE COUNT: 0.3 10*9/L (ref 0.3–0.8)
MONOCYTES RELATIVE PERCENT: 9 %
NEUTROPHILS ABSOLUTE COUNT: 1.6 10*9/L — ABNORMAL LOW (ref 1.8–7.8)
NEUTROPHILS RELATIVE PERCENT: 52.2 %
NUCLEATED RED BLOOD CELLS: 0 /100{WBCs} (ref <=4)
PLATELET COUNT: 123 10*9/L — ABNORMAL LOW (ref 150–450)
RED BLOOD CELL COUNT: 3.64 10*12/L — ABNORMAL LOW (ref 3.95–5.13)
RED CELL DISTRIBUTION WIDTH: 25 % — ABNORMAL HIGH (ref 12.2–15.2)
WBC ADJUSTED: 3.1 10*9/L — ABNORMAL LOW (ref 3.6–11.2)

## 2022-05-31 LAB — LACTATE DEHYDROGENASE: LACTATE DEHYDROGENASE: 172 U/L (ref 120–246)

## 2022-05-31 LAB — SLIDE REVIEW

## 2022-05-31 LAB — FERRITIN: FERRITIN: 355 ng/mL — ABNORMAL HIGH

## 2022-06-08 NOTE — Unmapped (Signed)
Called pt back with lab results all questions was answered at this time.

## 2022-06-08 NOTE — Unmapped (Signed)
Hi,     Cindy Gonzalez contacted the [employer] requesting results of the following:     Procedure: lab results  Completed On: 05/31/22    Please contact Cindy Gonzalez at [phone] for proper follow up.    Thank you,   Kelli Hope  [facility]   [phone]

## 2022-06-11 NOTE — Unmapped (Signed)
Assessment/Plan:    [name] was seen today for follow-up.    Diagnoses and all orders for this visit:    Thrombocytopenia, unspecified (CMS-HCC)    Type 2 diabetes with complication (CMS-HCC)  -     POCT glycosylated hemoglobin (Hb A1C)    Primary hypertension    NICM (nonischemic cardiomyopathy) (CMS-HCC)    Coronary artery disease involving native coronary artery of native heart without angina pectoris    Essential tremor    Dyslipidemia  -     Lipid Panel    Extranodal marginal zone B-cell lymphoma (CMS-HCC)    BMI 34.0-34.9,adult    History of vertigo    Skin carcinoma    Dryness of vagina        1.  Essential hypertension: The patient has been counseled on maintaining low-salt dietary salt intake, home blood pressure monitoring and exercise routine. Her home BP values average 130/70.  Her BP today is 110/60. She continues on lisinopril, amlodipine, spironolactone, metoprolol and furosemide as directed. She is followed by cardiology, last seen 03/2022. Spironolactone was more recently added to her regimen by cardiology. CMP done 03/2022 revealed normal LFT's, serum electrolytes and creatinine level . She will follow up with cardiology in 10/2022.     2.  Essential tremor: Patient has been on lorazepam for this condition but she takes 1/2 a tab bid prn only.    She has been on propranolol in the past and it resulted in excessive sedation and stopped.  She wishes to continue lorazepam since she takes it prn and it helps with insomnia and GAD as well. Primidone can result in sedation as well therefore defer this option for now.  TSH was normal in/2026.    3.  T2DM: Patient has been counseled on maintaining carbohydrate/sugar restricted diet.  HgA1c was improved at 7.2 in March/2024.  Glucose level was 120 at that time.  She continues on metformin as previously as directed.   Cardiologist recommended she start Jardiance 10 mg daily but pt decided not to take this medication due to potential side effects.    She had normal serum creatinine level of 1.0 in 05/2022. She had a diabetic eye exam early this year .  Repeat A1c today  is 7.4.    4.  Dyslipidemia: Patient has been counseled maintaining low-fat diet and statin and tricor regimen as previously directed.  She had normal LFTs in 02/2022 and lipid panel from 06/2021 revealed low HDL of 35  and LDL of  57.  She is due for annual fasting lipid panel today.    5.  Erythrocytosis, Leukopenia and history of lymphoma ( 20 yrs ago): she was seen by [facility] heme onc in  02/2022 and encounter details reviewed with the patient today. CBC done at that time with H/H of 18/57 with normal white blood cell count, platelet count and MCV.  Iron and ferritin level were low normal at that time. She was advised to stop iron supplement . She is on Vit B 12 supplement. She was started on Hydrea 500 mg M, W, F and 1000 mg on all other days.  She is had her first therapeutic phlebotomy in 03/2022.       6. BMI of 34:  She is mindful of her diet and she has limited exercise. She has no history of thyroid disease.  Her weight remains stable.     7..  CAD history and chronic heart failure with preserved EF: Patient followed by cardiology.  Last encounter was in 03/2022 and details reviewed with the pt today.  No med changes were made and she will follow up in 6 months time.  She continues on home oxygen.   She has been using it if her home O2 saturation is below 88 and or SOB.  She has been using it primarily at night but not often.  Patient had recent Zio patch early this year and follow up  echocardiogram in 08/2021 which revealed improved LVEF of 55-60%.   O2 sat today is 95% off oxygen.       8.  OA- pt take Mobic daily.  She is advised to take Maximum strength tylenol, 650 mg tab, 2 tablets up to 3 times a day.  She takes Meloxicam as needed only for break through pain but had been taking it daily. .   In addition, she has tramadol she takes prn.  She has been seen by [facility] orthopaedics and she has more recent issues with her left 5th finger.   .     9. History of skin cancer: She was seen by local dermatologist early this yr and diagnosed with basal cell CA.  Lesion was excised and she was advised to follow up with dermatology prn.  She reports no new worrisome skin lesions.     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  She  was started on  recommend estrace vaginal cream to use as directed. This has helped with her symptoms. She is using cream prn and she is having no vaginal complaints today.    11. History of constipation:  KUB xray done in 10/2020 revealed moderate stool burden.   In addition to increasing daily fluid intake, I recommended generic OTC metamucil, Citrucel or Miralax.  She has not needed to take any otc remedies since she has maintained a diet rich with fruits and vegetables and she is having a daily normal  BM.  She has occasional heartburn which responds to OTC TUMs.or Nexium .    12. Vertigo complaint: pt was seen earlier this year with vertigo. Possible BPPV.  Patient reported resolution of symptoms.   She reports no recent falls.      I personally spent 40 minutes, face to face, and non face to face, in the care of this patient, which includes pre , intra and post visit time on date of service.  All documented time is specific to EM and dose not include any procedures performed today.    Return in about 4 months (around 10/14/2022), or 40 min FU with Dr Rosine Beat.    Subjective:     HPI  Patient is seen today for routine follow-up visit.  I last saw the patient March/2024 for annual Medicare wellness visit and the following medical conditions:    1.  Essential hypertension: The patient has been counseled on maintaining low-salt dietary salt intake, home blood pressure monitoring and exercise routine. Her home BP values average 130/70.  Her BP today is 110/60. She continues on lisinopril, amlodipine, spironolactone, metoprolol and furosemide as directed. She is followed by cardiology, last seen 03/2022. Spironolactone was more recently added to her regimen by cardiology. CMP done 03/2022 revealed normal LFT's, serum electrolytes and creatinine level . She will follow up with cardiology in 10/2022.     2.  Essential tremor: Patient has been on lorazepam for this condition but she takes 1/2 a tab bid prn only.    She has been on propranolol in the past and it resulted in  excessive sedation and stopped.  She wishes to continue lorazepam since she takes it prn and it helps with insomnia and GAD as well. Primidone can result in sedation as well therefore defer this option for now.  TSH was normal in/2026.    3.  T2DM: Patient has been counseled on maintaining carbohydrate/sugar restricted diet.  HgA1c was improved at 7.2 in March/2024.  Glucose level was 120 at that time.  She continues on metformin as previously as directed.   Cardiologist recommended she start Jardiance 10 mg daily but pt decided not to take this medication due to potential side effects.    She had normal serum creatinine level of 1.0 in 05/2022. She had a diabetic eye exam early this year .  Repeat A1c today  is 7.4.    4.  Dyslipidemia: Patient has been counseled maintaining low-fat diet and statin and tricor regimen as previously directed.  She had normal LFTs in 02/2022 and lipid panel from 06/2021 revealed low HDL of 35  and LDL of  57.  She is due for annual fasting lipid panel today.    5.  Erythrocytosis, Leukopenia and history of lymphoma ( 20 yrs ago): she was seen by [facility] heme onc in  02/2022 and encounter details reviewed with the patient today. CBC done at that time with H/H of 18/57 with normal white blood cell count, platelet count and MCV.  Iron and ferritin level were low normal at that time. She was advised to stop iron supplement . She is on Vit B 12 supplement. She was started on Hydrea 500 mg M, W, F and 1000 mg on all other days.  She is had her first therapeutic phlebotomy in 03/2022.       6. BMI of 34:  She is mindful of her diet and she has limited exercise. She has no history of thyroid disease.  Her weight remains stable.     7..  CAD history and chronic heart failure with preserved EF: Patient followed by cardiology.  Last encounter was in 03/2022 and details reviewed with the pt today.  No med changes were made and she will follow up in 6 months time.  She continues on home oxygen.   She has been using it if her home O2 saturation is below 88 and or SOB.  She has been using it primarily at night but not often.  Patient had recent Zio patch early this year and follow up  echocardiogram in 08/2021 which revealed improved LVEF of 55-60%.   O2 sat today is 95% off oxygen.       8.  OA- pt take Mobic daily.  She is advised to take Maximum strength tylenol, 650 mg tab, 2 tablets up to 3 times a day.  She takes Meloxicam as needed only for break through pain but had been taking it daily. .   In addition, she has tramadol she takes prn.  She has been seen by [facility] orthopaedics and she has more recent issues with her left 5th finger.   .     9. History of skin cancer: She was seen by local dermatologist early this yr and diagnosed with basal cell CA.  Lesion was excised and she was advised to follow up with dermatology prn.  She reports no new worrisome skin lesions.     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  She  was started on  recommend estrace vaginal cream to use as directed. This has helped with her symptoms. She  is using cream prn and she is having no vaginal complaints today.    11. History of constipation:  KUB xray done in 10/2020 revealed moderate stool burden.   In addition to increasing daily fluid intake, I recommended generic OTC metamucil, Citrucel or Miralax.  She has not needed to take any otc remedies since she has maintained a diet rich with fruits and vegetables and she is having a daily normal  BM.  She has occasional heartburn which responds to OTC TUMs.or Nexium .    12. Vertigo complaint: pt was seen earlier this year with vertigo. Possible BPPV.  Patient reported resolution of symptoms.   She reports no recent falls.            ROS  Constitutional:  Denies  unexpected weight loss or gain, or weakness   Eyes:  Denies visual changes  Respiratory:  Denies cough or shortness of breath. No change in exercise  tolerance  Cardiovascular:  Denies chest pain, palpitations or lower extremity swelling   GI:  Denies abdominal pain, diarrhea, constipation   Musculoskeletal:  Denies myalgias  Skin:  Denies nonhealing lesions  Neurologic:  Denies headache, focal weakness or numbness, tingling  Endocrine:  Denies polyuria or polydypsia   Psychiatric:  Denies depression, anxiety      Outpatient Medications Prior to Visit   Medication Sig Dispense Refill    albuterol HFA 90 mcg/actuation inhaler Inhale 2 puffs every six (6) hours as needed for wheezing. 18 g 0    amlodipine (NORVASC) 10 MG tablet Take 1 tablet (10 mg total) by mouth daily. 90 tablet 3    atorvastatin (LIPITOR) 10 MG tablet Take 1 tablet (10 mg total) by mouth daily. 90 tablet 3    blood sugar diagnostic (ACCU-CHEK GUIDE TEST STRIPS) Strp by Other route Two (2) times a day (30 minutes before a meal). 200 each 1    calcium-vitamin D 500 mg(1,250mg ) -200 unit per tablet Take 1 tablet by mouth in the morning and 1 tablet in the evening. Take with meals.      cyanocobalamin 2000 MCG tablet Take 1 tablet (2,000 mcg total) by mouth daily.      estradiol (ESTRACE) 0.01 % (0.1 mg/gram) vaginal cream Insert 2 g into the vagina daily. Use externally prn 42.5 g 11    fenofibrate (TRICOR) 145 MG tablet Take 1 tablet (145 mg total) by mouth daily. 90 tablet 3    fluticasone propionate (FLONASE) 50 mcg/actuation nasal spray 1 spray into each nostril daily. 16 g 11    furosemide (LASIX) 20 MG tablet TAKE 1 TABLET BY MOUTH THREE TIMES A WEEK 36 tablet 0    hydroxyurea (HYDREA) 500 mg capsule Take 1 capsule (500 mg total) by mouth two (2) times a day. (Patient taking differently: Take 1 capsule (500 mg total) by mouth two (2) times a day. 1 capsule sat and Sunday and 2 capsules M-F) 132 capsule 3    ketoconazole (NIZORAL) 2 % cream APPLY  CREAM TOPICALLY ONCE DAILY FOR SCALP RASH 30 g 0    lancets (ACCU-CHEK SOFTCLIX LANCETS) Misc Use as directed to check glucose twice per day 200 each 5    lisinopril (PRINIVIL,ZESTRIL) 30 MG tablet Take 1 tablet (30 mg total) by mouth daily. 90 tablet 3    LORazepam (ATIVAN) 0.5 MG tablet TAKE 1/2 (ONE-HALF) TABLET BY MOUTH TWICE DAILY AS NEEDED FOR ANXIETY 30 tablet 0    meloxicam (MOBIC) 7.5 MG tablet Take 1 tablet (7.5 mg  total) by mouth daily. 90 tablet 1    metFORMIN (GLUCOPHAGE) 500 MG tablet 1 tablet in the AM and 1/2 tab with dinner daily 180 tablet 3    metoPROLOL succinate (TOPROL-XL) 50 MG 24 hr tablet TAKE 1 TABLET BY MOUTH AT BEDTIME 90 tablet 3    mupirocin (BACTROBAN) 2 % ointment Apply 1 Application topically Two (2) times a day (at 8am and 12:00).      RESTASIS 0.05 % ophthalmic emulsion INSTILL 1 DROP INTO EACH EYE TWICE DAILY      spironolactone (ALDACTONE) 25 MG tablet Take 1 tablet (25 mg total) by mouth daily. 90 tablet 3    azithromycin (ZITHROMAX) 250 MG tablet 1 tablet daily for 3 days. 3 tablet 0    aspirin (ECOTRIN) 81 MG tablet Take 1 tablet (81 mg total) by mouth daily. 30 tablet 11    blood-glucose meter kit Use as instructed 1 each 0    fluocinolone acetonide oil 0.01 % Drop Administer 1 drop into the left ear two (2) times a day. (Patient not taking: Reported on 04/10/2022) 20 mL 0    lancing device Misc USE TO CHECK BLOOD SUGAR 2 TIMES A DAY 100 each 2    fluticasone propionate (FLONASE) 50 mcg/actuation nasal spray 1 spray into each nostril daily. 16 g 11    traMADoL (ULTRAM) 50 mg tablet Take 1 tablet (50 mg total) by mouth every six (6) hours as needed for pain. (Patient not taking: Reported on 04/10/2022) 30 tablet 0     No facility-administered medications prior to visit.         Objective:       Vital Signs  BP 110/60  - Pulse 73  - Temp 36.2 ??C (97.2 ??F)  - Ht 157.5 cm (5' 2)  - Wt 79 kg (174 lb 3.2 oz)  - LMP  (LMP Unknown)  - SpO2 97%  - BMI 31.86 kg/m??      Exam  General: normal appearance  EYES: Anicteric sclerae.  ENT: Oropharynx moist.  RESP: Relaxed respiratory effort. Clear to auscultation without wheezes or crackles.   CV: Regular rate and rhythm. Normal S1 and S2. No murmurs or gallops.  No lower extremity edema. Posterior tibial pulses are 2+ and symmetric.  abd exam: non tender, no masses, no HSM   MSK: No focal muscle tenderness.  SKIN: Appropriately warm and moist.  NEURO: Stable gait and coordination.    Allergies:     Amoxicillin, Cephalexin, Ciprofloxacin, Hydrocodone-acetaminophen, and Sulfamethoxazole    Current Medications:     Current Outpatient Medications   Medication Sig Dispense Refill    albuterol HFA 90 mcg/actuation inhaler Inhale 2 puffs every six (6) hours as needed for wheezing. 18 g 0    amlodipine (NORVASC) 10 MG tablet Take 1 tablet (10 mg total) by mouth daily. 90 tablet 3    atorvastatin (LIPITOR) 10 MG tablet Take 1 tablet (10 mg total) by mouth daily. 90 tablet 3    blood sugar diagnostic (ACCU-CHEK GUIDE TEST STRIPS) Strp by Other route Two (2) times a day (30 minutes before a meal). 200 each 1    calcium-vitamin D 500 mg(1,250mg ) -200 unit per tablet Take 1 tablet by mouth in the morning and 1 tablet in the evening. Take with meals.      cyanocobalamin 2000 MCG tablet Take 1 tablet (2,000 mcg total) by mouth daily.      estradiol (ESTRACE) 0.01 % (0.1 mg/gram) vaginal cream Insert 2 g  into the vagina daily. Use externally prn 42.5 g 11    fenofibrate (TRICOR) 145 MG tablet Take 1 tablet (145 mg total) by mouth daily. 90 tablet 3    fluticasone propionate (FLONASE) 50 mcg/actuation nasal spray 1 spray into each nostril daily. 16 g 11    furosemide (LASIX) 20 MG tablet TAKE 1 TABLET BY MOUTH THREE TIMES A WEEK 36 tablet 0    hydroxyurea (HYDREA) 500 mg capsule Take 1 capsule (500 mg total) by mouth two (2) times a day. (Patient taking differently: Take 1 capsule (500 mg total) by mouth two (2) times a day. 1 capsule sat and Sunday and 2 capsules M-F) 132 capsule 3    ketoconazole (NIZORAL) 2 % cream APPLY  CREAM TOPICALLY ONCE DAILY FOR SCALP RASH 30 g 0    lancets (ACCU-CHEK SOFTCLIX LANCETS) Misc Use as directed to check glucose twice per day 200 each 5    lisinopril (PRINIVIL,ZESTRIL) 30 MG tablet Take 1 tablet (30 mg total) by mouth daily. 90 tablet 3    LORazepam (ATIVAN) 0.5 MG tablet TAKE 1/2 (ONE-HALF) TABLET BY MOUTH TWICE DAILY AS NEEDED FOR ANXIETY 30 tablet 0    meloxicam (MOBIC) 7.5 MG tablet Take 1 tablet (7.5 mg total) by mouth daily. 90 tablet 1    metFORMIN (GLUCOPHAGE) 500 MG tablet 1 tablet in the AM and 1/2 tab with dinner daily 180 tablet 3    metoPROLOL succinate (TOPROL-XL) 50 MG 24 hr tablet TAKE 1 TABLET BY MOUTH AT BEDTIME 90 tablet 3    mupirocin (BACTROBAN) 2 % ointment Apply 1 Application topically Two (2) times a day (at 8am and 12:00).      RESTASIS 0.05 % ophthalmic emulsion INSTILL 1 DROP INTO EACH EYE TWICE DAILY      spironolactone (ALDACTONE) 25 MG tablet Take 1 tablet (25 mg total) by mouth daily. 90 tablet 3    aspirin (ECOTRIN) 81 MG tablet Take 1 tablet (81 mg total) by mouth daily. 30 tablet 11    blood-glucose meter kit Use as instructed 1 each 0    fluocinolone acetonide oil 0.01 % Drop Administer 1 drop into the left ear two (2) times a day. (Patient not taking: Reported on 04/10/2022) 20 mL 0    lancing device Misc USE TO CHECK BLOOD SUGAR 2 TIMES A DAY 100 each 2     No current facility-administered medications for this visit.           Note - This record has been created using [employer]. Chart creation errors have been sought, but may not always have been located. Such creation errors do not reflect on the standard of medical care.    Jenell Milliner, MD

## 2022-06-12 ENCOUNTER — Other Ambulatory Visit: Admit: 2022-06-12 | Discharge: 2022-06-13 | Payer: MEDICARE

## 2022-06-12 ENCOUNTER — Ambulatory Visit
Admit: 2022-06-12 | Discharge: 2022-06-13 | Payer: MEDICARE | Attending: Hematology & Oncology | Primary: Hematology & Oncology

## 2022-06-12 LAB — CBC W/ AUTO DIFF
BASOPHILS ABSOLUTE COUNT: 0 10*9/L (ref 0.0–0.1)
BASOPHILS RELATIVE PERCENT: 0.8 %
EOSINOPHILS ABSOLUTE COUNT: 0 10*9/L (ref 0.0–0.5)
EOSINOPHILS RELATIVE PERCENT: 0.1 %
HEMATOCRIT: 34.2 % (ref 34.0–44.0)
HEMOGLOBIN: 11.8 g/dL (ref 11.3–14.9)
LYMPHOCYTES ABSOLUTE COUNT: 0.9 10*9/L — ABNORMAL LOW (ref 1.1–3.6)
LYMPHOCYTES RELATIVE PERCENT: 33.8 %
MEAN CORPUSCULAR HEMOGLOBIN CONC: 34.6 g/dL (ref 32.0–36.0)
MEAN CORPUSCULAR HEMOGLOBIN: 37.1 pg — ABNORMAL HIGH (ref 25.9–32.4)
MEAN CORPUSCULAR VOLUME: 107.4 fL — ABNORMAL HIGH (ref 77.6–95.7)
MEAN PLATELET VOLUME: 7.7 fL (ref 6.8–10.7)
MONOCYTES ABSOLUTE COUNT: 0.3 10*9/L (ref 0.3–0.8)
MONOCYTES RELATIVE PERCENT: 9.5 %
NEUTROPHILS ABSOLUTE COUNT: 1.5 10*9/L — ABNORMAL LOW (ref 1.8–7.8)
NEUTROPHILS RELATIVE PERCENT: 55.8 %
PLATELET COUNT: 115 10*9/L — ABNORMAL LOW (ref 150–450)
RED BLOOD CELL COUNT: 3.18 10*12/L — ABNORMAL LOW (ref 3.95–5.13)
RED CELL DISTRIBUTION WIDTH: 23.9 % — ABNORMAL HIGH (ref 12.2–15.2)
WBC ADJUSTED: 2.7 10*9/L — ABNORMAL LOW (ref 3.6–11.2)

## 2022-06-12 LAB — COMPREHENSIVE METABOLIC PANEL
ALBUMIN: 3.9 g/dL (ref 3.4–5.0)
ALKALINE PHOSPHATASE: 36 U/L — ABNORMAL LOW (ref 46–116)
ALT (SGPT): 25 U/L (ref 10–49)
ANION GAP: 4 mmol/L — ABNORMAL LOW (ref 5–14)
AST (SGOT): 26 U/L (ref <=34)
BILIRUBIN TOTAL: 0.5 mg/dL (ref 0.3–1.2)
BLOOD UREA NITROGEN: 26 mg/dL — ABNORMAL HIGH (ref 9–23)
BUN / CREAT RATIO: 27
CALCIUM: 9.7 mg/dL (ref 8.7–10.4)
CHLORIDE: 110 mmol/L — ABNORMAL HIGH (ref 98–107)
CO2: 27 mmol/L (ref 20.0–31.0)
CREATININE: 0.96 mg/dL
EGFR CKD-EPI (2021) FEMALE: 59 mL/min/{1.73_m2} — ABNORMAL LOW (ref >=60)
GLUCOSE RANDOM: 177 mg/dL (ref 70–179)
POTASSIUM: 5.1 mmol/L — ABNORMAL HIGH (ref 3.4–4.8)
PROTEIN TOTAL: 6.6 g/dL (ref 5.7–8.2)
SODIUM: 141 mmol/L (ref 135–145)

## 2022-06-12 LAB — FERRITIN: FERRITIN: 349.5 ng/mL — ABNORMAL HIGH

## 2022-06-12 LAB — SLIDE REVIEW

## 2022-06-12 LAB — LACTATE DEHYDROGENASE: LACTATE DEHYDROGENASE: 203 U/L (ref 120–246)

## 2022-06-12 NOTE — Unmapped (Addendum)
PLAN:  1) I would take 1 pill twice Mon through Friday and 1 pill once a day on Sat and Sun   Currently you are taking 14 tablets a week    Now you will be taking 12 tablets a week.   2) Labs in 6 weeks  in [place] (The week of July 22); I will have Markus Jarvis do a phone visit.   3) I will see you in 3 months   4) You can rinse with teaspoon of salt and baking soda when you brush.    All lab results last 24 hours:    Recent Results (from the past 24 hour(s))   Comprehensive Metabolic Panel    Collection Time: 06/12/22  3:06 PM   Result Value Ref Range    Sodium 141 135 - 145 mmol/L    Potassium 5.1 (H) 3.4 - 4.8 mmol/L    Chloride 110 (H) 98 - 107 mmol/L    CO2 27.0 20.0 - 31.0 mmol/L    Anion Gap 4 (L) 5 - 14 mmol/L    BUN 26 (H) 9 - 23 mg/dL    Creatinine 1.61 0.96 - 1.02 mg/dL    BUN/Creatinine Ratio 27     eGFR CKD-EPI (2021) Female 59 (L) >=60 mL/min/1.25m2    Glucose 177 70 - 179 mg/dL    Calcium 9.7 8.7 - 04.5 mg/dL    Albumin 3.9 3.4 - 5.0 g/dL    Total Protein 6.6 5.7 - 8.2 g/dL    Total Bilirubin 0.5 0.3 - 1.2 mg/dL    AST 26 <=40 U/L    ALT 25 10 - 49 U/L    Alkaline Phosphatase 36 (L) 46 - 116 U/L   Lactate dehydrogenase    Collection Time: 06/12/22  3:06 PM   Result Value Ref Range    LDH 203 120 - 246 U/L   Ferritin    Collection Time: 06/12/22  3:06 PM   Result Value Ref Range    Ferritin 349.5 (H) 7.3 - 270.7 ng/mL This is the measurement of total body iron. Phlebotomy when this < 20.   CBC w/ Differential    Collection Time: 06/12/22  3:06 PM   Result Value Ref Range    WBC 2.7 (L) 3.6 - 11.2 10*9/L    RBC 3.18 (L) 3.95 - 5.13 10*12/L    HGB 11.8 11.3 - 14.9 g/dL This carries oxygen. This is a problem when < 7     HCT 34.2 34.0 - 44.0 % The goal is < 45     MCV 107.4 (H) 77.6 - 95.7 fL    MCH 37.1 (H) 25.9 - 32.4 pg    MCHC 34.6 32.0 - 36.0 g/dL    RDW 98.1 (H) 19.1 - 15.2 %    MPV 7.7 6.8 - 10.7 fL    Platelet 115 (L) 150 - 450 10*9/L The platelets are the cells that stop bleeding.  If your platelets < 10, you are at risk for bleeding.     I am concerned that you may have an odd response to Hydrea.   Some patients on Hydrea will low platelets then high platelets then low platelets. I don't know if this is a problem for you - if it is we will use a different drug.     Neutrophils % 55.8 %    Lymphocytes % 33.8 %    Monocytes % 9.5 %    Eosinophils % 0.1 %    Basophils %  0.8 %    Absolute Neutrophils 1.5 (L) 1.8 - 7.8 10*9/L    Absolute Lymphocytes 0.9 (L) 1.1 - 3.6 10*9/L    Absolute Monocytes 0.3 0.3 - 0.8 10*9/L    Absolute Eosinophils 0.0 0.0 - 0.5 10*9/L    Absolute Basophils 0.0 0.0 - 0.1 10*9/L    Macrocytosis Slight (A) Not Present    Anisocytosis Marked (A) Not Present   Morphology Review    Collection Time: 06/12/22  3:06 PM   Result Value Ref Range    Smear Review Comments See Comment (A) Undefined

## 2022-06-12 NOTE — Unmapped (Signed)
ID:  Cindy Gonzalez is an 82 y.o. with PCV    DZ CHAR:   5.6/17.8/55.8/243; K 5.3; LDH: 248  Epo < 1  Smear: No immature forms; nl RBC morphology; smudge cells; reactive lymph   Ferritin: 27.5  LDH: 399  No h/o thrombosis.     Variants of Known/Likely Clinical Significance:   Gene Coding Predicted Protein Variant allele fraction   IDH2 c.419G>A p.(Arg140Gln) 26.7%   JAK2 c.1849G>T p.(Val617Phe) 69.8%   TET2 c.4045-2A>G p.(?) 13.4%      ASSESSMENT:   Cindy Gonzalez is an 82 y.o. w/ a h/o erythrocytosis.  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  A bone marrow biopsy is not needed to make the diagnosis though it may offer additional prognostic information.  She also has an IDH2 and TET2 mutation, conveying worse prognosis.    She is tolerating Hydrea well.  We are currently targeting a HCT of < 42.5.  Thus we can reduce the dose.  She may be at some risk of developing cyclic thrombocytopenia.  At this point, I would simply follow it.      We reviewed the reasons for treatment and the plan below.     PLAN:   1) Hydrea 500 mg twice a day on Mon to Fri and one 500 mg once a day on Sat and Sun.  This is a dose reduction of 17%.    2) Labs in 6 weeks  in [place] (The week of July 22); I will have Markus Jarvis do a phone visit (she does not do video visits)  3) RTC in 3 months   4) I recommended S/S rinses.   5) Low dose aspirin once a day.     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    04/2020: 3.2/13.7/41.1/225; ANC: 1.9; ALC: 1.0  02/2021: CTA: Negative; B12: 1,251  04/2021: TTE: EF 35-40%; 5.5/14.6/44.3/268; LDH: 234  10/14/21: 8.0/17.7/55.4/265; AMC: 0.1; ALC: 0.5; K: 5.0  10/19/21: JAK2: 64.9; Epo < 1  11/08/21: 5.6/17.8/55.8/243; K 5.3; LDH: 248  02/06/22: Seen by [facility]  7.8/19.5/59.7/262  Smear: No immature forms; nl RBC morphology; smudge cells; reactive lymph   Myeloid mutation panel: IDH2, JAK2, TET2  Ferritin: 27.5  LDH: 399  02/14/22: Started Hydroxyurea 3.5 g/wk  6.5/18.8/58.4/236; MCV: 84.1  02/23/22: Therapeutic phlebotomy   5.9/19.5/60.6/261  Ferritin: 25.3  02/26/22: 6.3/18.4/57.2/262; Hydrea 5.5 g/wk  03/09/22: Phlebotomy;   4.3/17.8/.55.4/153;  Ferritin: 18.3  03/17/22: 5.0/17.7/54.9/114; MCV: 87.5  04/10/22:Phlebotomy; 3.0/17.2/52.2/145; Hydrea 7 g/wk;   MCV: 90.0  ferritin: 265.5  06/12/22:: 2.7/11.8/34.2/115; ANC 1.5: Hydrea reduced 6 g/wk     INTERVAL HX:  Cindy Gonzalez comes for follow-up of her PCV.  We talked about the following:   She notes some sneezing and coughing with mild shortness of breath.   She has not had any mouth sores; no rashes, no GI sx  No B sx  No MPN sx  No ID sx  No adherence problems  The rest of her ROS was negative.     MPN 10 Score  (App: [email or web address])   [email or web address]  (Useful for monitoring patient symptoms.    Symptom Range Pre-Tx  Fatigue in the past 24 hours (Absent) 0 1 2 3 4 5 6 7 8 9  10 (Worst Imaginable) 0   Filling up quickly when you eat (Early satiety)  (Absent) 0 1 2 3 4 5 6 7 8 9  10 (Worst Imaginable) 0   Abdominal discomfort  (Absent) 0 1 2 3 4 5 6 7 8 9  10 (Worst Imaginable) 0   Inactivity (Absent) 0 1 2 3 4 5 6 7 8 9  10 (Worst Imaginable) Uses walking stick; slowed done arthritis 1   Problems with concentration - (Absent) 0 1 2 3 4 5 6 7 8 9  10 (Worst Imaginable) 0   Numbness/ Tingling (in my hands and feet) (Absent) 0 1 2 3 4 5 6 7 8 9  10 (Worst Imaginable) occ in feet 1 - 2   Night sweats (Absent) 0 1 2 3 4 5 6 7 8 9  10 (Worst Imaginable) She notes a little sweatiness with her cholesterol medicine 1   Itching (pruritus) (Absent) 0 1 2 3 4 5 6 7 8 9  10 (Worst Imaginable) 1   Bone pain (diffuse not joint pain or arthritis) (Absent) 0 1 2 3 4 5 6 7 8 9  10 (Worst Imaginable) It is difficult to separate bone from arthritis pain    Fever (>100 F) (Absent) 0 1 2 3 4 5 6 7 8 9  10 (Daily) 0   Unintentional weight loss last 6 months (Absent) 0 1 2 3 4 5 6 7 8 9  10 (Worst Imaginable) With DM meds 16 lbs      5     PHYSICAL EXAM:  VS: As recorded in Epic  GENERAL: She appears well   HEENT: OP is clear  LYMPH NODES:  No LAN  LUNGS: CTA; mild kyphosis  COR: RRR w/o m/r/g  ABD: NTND; no HSM  EXT: Mild venous insufficiency     PMHx of pancreatitis, hepatitis, T2DM, atelectasis, acute respiratory failure with hypoxemia, nonischemic cardiomyopathy, HTN, aortic stenosis, heart failure, CAD, essential tremor, osteoarthritis, vertigo, iron deficiency anemia, insomnia, dyslipidemia, Diffuse large B cell lymphoma.      Primary prophylaxis of thrombosis  All patients: Aspirin unless    VWF activity <30%,   Platelet >?1 million,   CALR-mutated low-risk ET    PV with Hct >?45%: Add phlebotomy/cytoreduction to target Hct <?45%  Age >?60 years and/or prior history of thrombosis: Add cytoreduction  Secondary prophylaxis after thrombotic event  All patients: Cytoreduction  Typical VTE: Consider indefinite VKA for most patients; aspirin if not on VKA  Atypical VTE: Indefinite VKA  Arterial thrombosis: Aspirin    PCV Risk  Age > 60:    1   No: 0   Yes: 1    History of thrombosis:  0   No: 0   Yes: 1    Low Risk: 0 points  High Risk 1 -2 points    Low Risk patients treat with aspirin every day;   High Risk patients treat with aspirin BID*  * Consider BID aspirin for patients with microvascular complications, additional CV risk factors, or leukocytosis    PV:   Major criteria  Hgb/Hct  Men: >?16.5?g/dL or 19% or inc red blood cell mass  Women: > 16 g/dL or 14% or inc red blood cell mass  Inc in RBC Mass > 25%  Presence of JAK2 or JAK2 exon 12 mutation   BM biopsy showing hypercellularity for age with trilineage growth (panmyelosis) including prominent erythroid, granulocytic and  megakaryocytic proliferation with pleomorphic, mature megakaryocytes (differences in size, without atypia)     Minor criteria  Subnormal serum erythropoietin level     Diagnosis: All 3 major or first 2 major and 1 minor    Post-PV MF  Required criteria  Previous dx of PCV  BM Fibrosis of grade 2/3    Additional  Anemia or loss of need for cytoreduction  Leukoerythroblastosis  Splenomegaly > 5 cm from baseline or new splenomegaly  2 or more constitutional sx: > 10% wt loss in 6 mths, NS, fever > 37.5

## 2022-06-12 NOTE — Unmapped (Signed)
Patient educated on various topics, clinical references attached to patient's MyChart and AVS. Time spent 3 minutes.

## 2022-06-14 ENCOUNTER — Ambulatory Visit: Admit: 2022-06-14 | Discharge: 2022-06-15 | Payer: MEDICARE

## 2022-06-14 DIAGNOSIS — Z6834 Body mass index (BMI) 34.0-34.9, adult: Principal | ICD-10-CM

## 2022-06-14 DIAGNOSIS — C884 Extranodal marginal zone B-cell lymphoma of mucosa-associated lymphoid tissue [MALT-lymphoma]: Principal | ICD-10-CM

## 2022-06-14 DIAGNOSIS — I428 Other cardiomyopathies: Principal | ICD-10-CM

## 2022-06-14 DIAGNOSIS — I2510 Atherosclerotic heart disease of native coronary artery without angina pectoris: Principal | ICD-10-CM

## 2022-06-14 DIAGNOSIS — G250 Essential tremor: Principal | ICD-10-CM

## 2022-06-14 DIAGNOSIS — E785 Hyperlipidemia, unspecified: Principal | ICD-10-CM

## 2022-06-14 DIAGNOSIS — I10 Essential (primary) hypertension: Principal | ICD-10-CM

## 2022-06-14 DIAGNOSIS — D696 Thrombocytopenia, unspecified: Principal | ICD-10-CM

## 2022-06-14 DIAGNOSIS — C4499 Other specified malignant neoplasm of skin, unspecified: Principal | ICD-10-CM

## 2022-06-14 DIAGNOSIS — E118 Type 2 diabetes mellitus with unspecified complications: Principal | ICD-10-CM

## 2022-06-14 DIAGNOSIS — N898 Other specified noninflammatory disorders of vagina: Principal | ICD-10-CM

## 2022-06-14 DIAGNOSIS — Z87898 Personal history of other specified conditions: Principal | ICD-10-CM

## 2022-06-14 LAB — LIPID PANEL
CHOLESTEROL/HDL RATIO SCREEN: 4.1 (ref 1.0–4.5)
CHOLESTEROL: 152 mg/dL (ref <=200)
HDL CHOLESTEROL: 37 mg/dL — ABNORMAL LOW (ref 40–60)
LDL CHOLESTEROL CALCULATED: 81 mg/dL (ref 40–99)
NON-HDL CHOLESTEROL: 115 mg/dL (ref 70–130)
TRIGLYCERIDES: 171 mg/dL — ABNORMAL HIGH (ref 0–150)
VLDL CHOLESTEROL CAL: 34.2 mg/dL (ref 11–41)

## 2022-06-14 NOTE — Unmapped (Signed)
Result reviewed with the pt today.

## 2022-06-17 MED ORDER — HYDROXYUREA 500 MG CAPSULE
ORAL_CAPSULE | 3 refills | 0 days | Status: CP
Start: 2022-06-17 — End: ?

## 2022-06-23 DIAGNOSIS — D751 Secondary polycythemia: Principal | ICD-10-CM

## 2022-06-23 MED ORDER — HYDROXYUREA 500 MG CAPSULE
ORAL_CAPSULE | Freq: Two times a day (BID) | ORAL | 3 refills | 66.00000 days
Start: 2022-06-23 — End: ?

## 2022-06-25 ENCOUNTER — Emergency Department: Admit: 2022-06-25 | Discharge: 2022-06-25 | Disposition: A | Discharge status: Home / Self Care | Payer: MEDICARE

## 2022-06-25 ENCOUNTER — Ambulatory Visit: Admit: 2022-06-25 | Discharge: 2022-06-25 | Disposition: A | Discharge status: Home / Self Care | Payer: MEDICARE

## 2022-06-25 DIAGNOSIS — M5136 Other intervertebral disc degeneration, lumbar region: Principal | ICD-10-CM

## 2022-06-25 DIAGNOSIS — M5431 Sciatica, right side: Principal | ICD-10-CM

## 2022-06-25 DIAGNOSIS — M5432 Sciatica, left side: Principal | ICD-10-CM

## 2022-06-25 LAB — URINALYSIS WITH MICROSCOPY WITH CULTURE REFLEX PERFORMABLE
BACTERIA: NONE SEEN /HPF
BILIRUBIN UA: NEGATIVE
BLOOD UA: NEGATIVE
GLUCOSE UA: NEGATIVE
KETONES UA: NEGATIVE
LEUKOCYTE ESTERASE UA: NEGATIVE
NITRITE UA: NEGATIVE
PH UA: 6.5 (ref 5.0–9.0)
PROTEIN UA: NEGATIVE
RBC UA: 1 /HPF (ref <=4)
SPECIFIC GRAVITY UA: 1.008 (ref 1.003–1.030)
SQUAMOUS EPITHELIAL: 1 /HPF (ref 0–5)
UROBILINOGEN UA: <2
WBC UA: <1 /HPF (ref 0–5)

## 2022-06-25 MED ADMIN — dexAMETHasone (DECADRON) 4 mg/mL injection 8 mg: 8 mg | INTRAMUSCULAR | @ 19:00:00 | Stop: 2022-06-25

## 2022-06-25 NOTE — Unmapped (Signed)
Pt arrives with c/o lower back, bilat hip pain, posterior neck and bilat shoulder pain.  Pt treating with meloxicam and tylenol without relief.   Pt denies injury.

## 2022-06-25 NOTE — Unmapped (Signed)
[facility]  Emergency Department Provider Note        ED Clinical Impression      Final diagnoses:   Degenerative disc disease, lumbar (Primary)   Bilateral sciatica           Impression, ED Course, Assessment and Plan      Impression: Cindy Gonzalez is a 82 y.o. female with PMH significant for CAD, T2DM, large B-cell lymphoma, HTN pancreatitis, essential tremor, HLD, IDA, BPPV who presents to the emergency department for lower back and hip pain.  Hypertensive in triage otherwise VSS, nontoxic in appearance.  On exam, patient has TTP from L2-L4 without any step-off or deformity.  Pain immediately over the SI joint as well as bilateral mid posterior pelvic region, primarily on the right.  No bony abnormality.  Negative straight leg exam.  No significant discomfort to BLE including frog-leg positioning.  Ambulatory with a slow shuffling gait.  Neurovascular intact.    Ddx includes OA/DDD versus compression fracture versus sciatica or neuropathic pain versus less likely malignancy.  Could consider atypical UTI.  Will obtain x-ray of the pelvis and lumbar spine, UA.    2:34 PM  Urine clear.  X-rays notable for degenerative changes otherwise no bony lesions or fractures appreciated.  Have discussed results with patient.  Have provided one-time injection of steroids during visit in hopes to improve her symptoms as I do not want to place her on extended taper due to underlying history of diabetes.  Recommended she monitor her blood sugars.  Continue previously prescribed medications as needed.  Ice/heat, gentle stretching exercises.  After shared discussion, I placed a referral to the spine center for ongoing management as she has had similar previous multiple times per discussion.  Strict reevaluation criteria provided and patient belies understanding agreeable to discharge.         Additional Medical Decision Making     I have reviewed the vital signs and the nursing notes. Labs and radiology results that were available during my care of the patient were independently reviewed by me and considered in my medical decision making.     I independently visualized the radiology images.   I reviewed the patient's prior medical records.     Portions of this record have been created using Scientist, clinical (histocompatibility and immunogenetics). Dictation errors have been sought, but may not have been identified and corrected.  ____________________________________________         History        Chief Complaint  Hip Pain and Back Pain      HPI   Cindy Gonzalez is a 82 y.o. female with PMH significant for CAD, T2DM, large B-cell lymphoma, HTN pancreatitis, essential tremor, HLD, IDA, BPPV who presents to the emergency department for lower back and hip pain.  Onset of symptoms 5 days ago.  Denies any recent trauma or heavy lifting.  She is still to ambulate with her walker.  Denies any significant exacerbating or alleviating factors.  Pain starts at the midline of the lumbar region and radiates to bilateral posterior pelvic area and some down to the right lateral hip.  No paresthesia, saddle anesthesia, weakness outside of pain, fevers, dysuria or hematuria.  Denies any recent illnesses.  No overlying vesicular rash.  Has tried Tylenol in addition to her previously prescribed meloxicam as well as a tramadol however the tramadol made her vomit.      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)    Hypoxemia    Essential tremor    Hyponatremia    Acute disorder of liver    NICM (nonischemic cardiomyopathy) (CMS-HCC)    Extranodal marginal zone B-cell lymphoma (CMS-HCC)    Leukopenia    Type 2 diabetes with complication (CMS-HCC)    Dyslipidemia    History of lymphoma    Arthritis    Seborrheic dermatitis    Insomnia    Obesity    Osteoarthritis    Mild aortic stenosis    Dryness of vagina    Skin carcinoma    Vertigo    History of pneumonia    History of constipation    Erythrocytosis    Iron deficiency anemia    Iron deficiency    Thrombocytopenia, unspecified (CMS-HCC)    BMI 34.0-34.9,adult    History of vertigo       Past Surgical History:   Procedure Laterality Date    CHOLECYSTECTOMY      HYSTERECTOMY      OOPHORECTOMY         No current facility-administered medications for this encounter.    Current Outpatient Medications:     albuterol HFA 90 mcg/actuation inhaler, Inhale 2 puffs every six (6) hours as needed for wheezing., Disp: 18 g, Rfl: 0    amlodipine (NORVASC) 10 MG tablet, Take 1 tablet (10 mg total) by mouth daily., Disp: 90 tablet, Rfl: 3    aspirin (ECOTRIN) 81 MG tablet, Take 1 tablet (81 mg total) by mouth daily., Disp: 30 tablet, Rfl: 11    atorvastatin (LIPITOR) 10 MG tablet, Take 1 tablet (10 mg total) by mouth daily., Disp: 90 tablet, Rfl: 3    blood sugar diagnostic (ACCU-CHEK GUIDE TEST STRIPS) Strp, by Other route Two (2) times a day (30 minutes before a meal)., Disp: 200 each, Rfl: 1    blood-glucose meter kit, Use as instructed, Disp: 1 each, Rfl: 0    calcium-vitamin D 500 mg(1,250mg ) -200 unit per tablet, Take 1 tablet by mouth in the morning and 1 tablet in the evening. Take with meals., Disp: , Rfl:     cyanocobalamin 2000 MCG tablet, Take 1 tablet (2,000 mcg total) by mouth daily., Disp: , Rfl:     estradiol (ESTRACE) 0.01 % (0.1 mg/gram) vaginal cream, Insert 2 g into the vagina daily. Use externally prn, Disp: 42.5 g, Rfl: 11    fenofibrate (TRICOR) 145 MG tablet, Take 1 tablet (145 mg total) by mouth daily., Disp: 90 tablet, Rfl: 3    fluticasone propionate (FLONASE) 50 mcg/actuation nasal spray, 1 spray into each nostril daily., Disp: 16 g, Rfl: 11    furosemide (LASIX) 20 MG tablet, TAKE 1 TABLET BY MOUTH THREE TIMES A WEEK, Disp: 36 tablet, Rfl: 0    hydroxyurea (HYDREA) 500 mg capsule, Take 1 pill twice a day Mon - Friday; take 1  pill a day on Sat and SUn, Disp: 150 capsule, Rfl: 3    ketoconazole (NIZORAL) 2 % cream, APPLY  CREAM TOPICALLY ONCE DAILY FOR SCALP RASH, Disp: 30 g, Rfl: 0    lancets (ACCU-CHEK SOFTCLIX LANCETS) Misc, Use as directed to check glucose twice per day, Disp: 200 each, Rfl: 5    lancing device Misc, USE TO CHECK BLOOD SUGAR 2 TIMES A DAY, Disp: 100 each, Rfl: 2    lisinopril (PRINIVIL,ZESTRIL) 30 MG tablet, Take 1 tablet (30 mg total) by mouth daily., Disp: 90 tablet, Rfl: 3    LORazepam (ATIVAN) 0.5 MG tablet, TAKE 1/2 (ONE-HALF) TABLET BY MOUTH TWICE DAILY AS NEEDED FOR ANXIETY, Disp: 30 tablet, Rfl: 0    meloxicam (MOBIC) 7.5 MG tablet, Take 1 tablet (7.5 mg total) by mouth daily., Disp: 90 tablet, Rfl: 1    metFORMIN (GLUCOPHAGE) 500 MG tablet, 1 tablet in the AM and 1/2 tab with dinner daily, Disp: 180 tablet, Rfl: 3    metoPROLOL succinate (TOPROL-XL) 50 MG 24 hr tablet, TAKE 1 TABLET BY MOUTH AT BEDTIME, Disp: 90 tablet, Rfl: 3    mupirocin (BACTROBAN) 2 % ointment, Apply 1 Application topically Two (2) times a day (at 8am and 12:00)., Disp: , Rfl:     RESTASIS 0.05 % ophthalmic emulsion, INSTILL 1 DROP INTO EACH EYE TWICE DAILY, Disp: , Rfl:     spironolactone (ALDACTONE) 25 MG tablet, Take 1 tablet (25 mg total) by mouth daily., Disp: 90 tablet, Rfl: 3    Allergies  Amoxicillin, Cephalexin, Ciprofloxacin, Hydrocodone-acetaminophen, and Sulfamethoxazole    Family History   Problem Relation Age of Onset    Cancer Other     Diabetes Mother     Asthma Mother     Hypertension Father     Stroke Father        Social History  Social History     Tobacco Use    Smoking status: Never    Smokeless tobacco: Never   Vaping Use    Vaping status: Never Used Substance Use Topics    Alcohol use: Not Currently    Drug use: Never        Physical Exam     This provider entered the patient's room: Yes:    If this provider did not enter the room, a comprehensive physical exam was not able to be performed due to increased infection risk to themselves, other providers, staff and other patients), as well as to conserve personal protective equipment (PPE) utilization during the COVID-19 pandemic.    If this provider did enter the patient room, the following was PPE worn: Surgical mask, eye protection and gloves    ED Triage Vitals [06/25/22 1134]   Enc Vitals Group      BP 179/72      Heart Rate 90      SpO2 Pulse       Resp 20      Temp 36.7 ??C (98.1 ??F)      Temp src       SpO2 96 %     Constitutional: Alert and oriented. Well appearing and in no distress.  Eyes: Conjunctivae are normal.  Cardiovascular: Normal rate, regular rhythm.  Pansystolic murmur 4/6 auscultated loudest at the left second ICS.  No rubs or gallops.    Respiratory: Normal respiratory effort. Breath sounds are normal in all lobes.  Gastrointestinal: Soft and nontender.   Musculoskeletal: TTP from L2-L4 without any step-off or deformity.  Pain  immediately over the SI joint as well as bilateral mid posterior pelvic region, primarily on the right.  No bony abnormality.  Negative straight leg exam.  No significant discomfort to BLE including frog-leg positioning.  Ambulatory with a slow shuffling gait.  Neurologic: Normal speech and language. No gross focal neurologic deficits are appreciated.  GCS 15.Marland Kitchen  BLE strength 5/5 with plantar/dorsiflexion intact.  Skin: Skin is warm, dry and intact. No rash noted.  Psychiatric: Mood and affect are normal. Speech and behavior are normal.     EKG     N/a     Radiology     XR Lumbar Spine AP Lateral And Obliques   Final Result   - No acute fractures.   - Degenerative changes of the imaged spine as described.   - Limited study in the absence of a lateral view.         XR Pelvis 3 Or More Views   Final Result   No acute osseous abnormality or evidence for inflammatory or erosive arthropathy.             Procedures     N/a           Chrissie Noa, FNP  06/25/22 1520

## 2022-06-26 DIAGNOSIS — D751 Secondary polycythemia: Principal | ICD-10-CM

## 2022-06-29 DIAGNOSIS — M1990 Unspecified osteoarthritis, unspecified site: Principal | ICD-10-CM

## 2022-06-29 MED ORDER — MELOXICAM 7.5 MG TABLET
ORAL_TABLET | Freq: Every day | ORAL | 0 refills | 0 days
Start: 2022-06-29 — End: ?

## 2022-07-02 DIAGNOSIS — D751 Secondary polycythemia: Principal | ICD-10-CM

## 2022-07-02 MED ORDER — MELOXICAM 7.5 MG TABLET
ORAL_TABLET | Freq: Every day | ORAL | 1 refills | 90 days
Start: 2022-07-02 — End: 2023-07-02

## 2022-07-02 MED ORDER — HYDROXYUREA 500 MG CAPSULE
ORAL_CAPSULE | ORAL | 3 refills | 89 days | Status: CP
Start: 2022-07-02 — End: ?
  Filled 2022-07-12: qty 150, 88d supply, fill #0

## 2022-07-02 NOTE — Unmapped (Signed)
Patient is requesting the following refill  Requested Prescriptions     Pending Prescriptions Disp Refills    meloxicam (MOBIC) 7.5 MG tablet [Pharmacy Med Name: Meloxicam 7.5 MG Oral Tablet] 90 tablet 1     Sig: Take 1 tablet (7.5 mg total) by mouth daily.       Recent Visits  Date Type Provider Dept   06/14/22 Office Visit Jenell Milliner, MD [facility] [address] [facility]   05/18/22 Office Visit Nile Dear, Emogene Morgan, NP [place] [address] [facility]   03/22/22 Office Visit Johnn Hai, Loleta [name], FNP [facility] [address] [facility]   03/19/22 Office Visit Mangel, [name], DO [facility] [address] [facility]   03/06/22 Office Visit Jenell Milliner, MD [facility] [address] [facility]   12/21/21 Office Visit Deneise Lever, FNP [facility] [address] [facility]   10/31/21 Office Visit Jenell Milliner, MD [facility] [facility] [address] [facility]   10/16/21 Office Visit Mangel, [name], DO [place] [facility] [address] [facility]   08/17/21 Office Visit Clapp, Magnus Ivan, FNP [facility] [facility] [address] [facility]   Showing recent visits within past 365 days and meeting all other requirements  Future Appointments  Date Type Provider Dept   10/15/22 Appointment Mangel, [name], DO [facility] [facility] [address] [facility]   Showing future appointments within next 365 days and meeting all other requirements       Labs: Not applicable this refill

## 2022-07-02 NOTE — Unmapped (Signed)
[facility] Refill Coordination Note    Specialty Lite Medication(s) to be Shipped:   Hematology/Oncology: Hydroxyurea 500mg     Other medication(s) to be shipped: No additional medications requested for fill at this time     Cindy Gonzalez, DOB: [birth year]  Phone: [phone] (home)       All above HIPAA information was verified with patient.     Was a Nurse, learning disability used for this call? No    Changes to medications: [name] reports no changes at this time.  Changes to insurance: No      REFERRAL TO PHARMACIST     Referral to the pharmacist: Not needed      [facility]     Shipping address confirmed in Epic.     Delivery Scheduled: Yes, Expected medication delivery date: 07/04/22.  However, Rx request for refills was sent to the provider as there are none remaining.     Medication will be delivered via Same Day Courier to the prescription address in Epic WAM.    Jasper Loser   [facility] Specialty Technician

## 2022-07-02 NOTE — Unmapped (Signed)
Clinical Assessment Needed For: Dose Change  Medication: Hydroxyurea  Last Fill Date/Day Supply: 4-29 / 66  Copay $0  Was previous dose already scheduled to fill: Yes    Notes to Pharmacist: Scheduled to fill 7-3

## 2022-07-03 MED ORDER — MELOXICAM 7.5 MG TABLET
ORAL_TABLET | Freq: Every day | ORAL | 1 refills | 90 days | Status: CP
Start: 2022-07-03 — End: 2023-07-03

## 2022-07-07 ENCOUNTER — Ambulatory Visit
Admission: EM | Admit: 2022-07-07 | Discharge: 2022-07-07 | Disposition: A | Discharge status: Home / Self Care | Payer: Medicare HMO | Attending: Internal Medicine | Admitting: Internal Medicine

## 2022-07-07 DIAGNOSIS — S20362A Insect bite (nonvenomous) of left front wall of thorax, initial encounter: Secondary | ICD-10-CM

## 2022-07-07 DIAGNOSIS — S20469A Insect bite (nonvenomous) of unspecified back wall of thorax, initial encounter: Secondary | ICD-10-CM | POA: Diagnosis not present

## 2022-07-07 DIAGNOSIS — W57XXXA Bitten or stung by nonvenomous insect and other nonvenomous arthropods, initial encounter: Secondary | ICD-10-CM | POA: Diagnosis not present

## 2022-07-07 MED ORDER — TRIAMCINOLONE ACETONIDE 0.1 % EX CREA: 1.0000 | TOPICAL_CREAM | Freq: Two times a day (BID) | CUTANEOUS | 0 refills | Status: DC

## 2022-07-07 MED ORDER — MUPIROCIN CALCIUM 2 % EX CREA: 1.0000 | TOPICAL_CREAM | Freq: Two times a day (BID) | CUTANEOUS | 0 refills | Status: DC

## 2022-07-07 NOTE — ED Provider Notes (Addendum)
[facility]    CSN: [id number] Arrival date & time: 07/07/22  0840      History   Chief Complaint Chief Complaint  Patient presents with   Rash    HPI Jacqueline Arroyo is a 81 y.o. female presents to urgent care with 1 week history of a rash.  She noticed a single bump on her left upper chest about 1 week ago.  This rash itches.  About 2 to 3 days ago, she noticed 3 bumps on her midline back.  These lesions also itch.  She denies burning, tingling or pain in the areas of the rash.  She denies changes in soaps, lotions, detergents, diet or medications.  She has used some expired triamcinolone cream and taking Tylenol with minimal relief of symptoms.  She is concerned that this is shingles as she has never had her shingles vaccination.  HPI  Past Medical History:  Diagnosis Date   Diabetes mellitus without complication (HCC)    Hypertension     There are no problems to display for this patient.   Past Surgical History:  Procedure Laterality Date   ABDOMINAL HYSTERECTOMY     BACK SURGERY     CHOLECYSTECTOMY     FOOT SURGERY      OB History   No obstetric history on file.      Home Medications    Prior to Admission medications   Medication Sig Start Date End Date Taking? Authorizing Provider  acetic acid 2 % otic solution SMARTSIG:Left Ear 01/19/21  Yes [provider]  albuterol (VENTOLIN HFA) 108 (90 Base) MCG/ACT inhaler Inhale into the lungs. 07/12/19  Yes [provider]  amLODipine (NORVASC) 10 MG tablet Take 10 mg by mouth daily. 09/01/20  Yes [provider]  aspirin 81 MG EC tablet Take by mouth. 07/17/19  Yes [provider]  atorvastatin (LIPITOR) 10 MG tablet Take 1 tablet by mouth daily. 03/01/20  Yes [provider]  calcium-vitamin D (OSCAL WITH D) 500-200 MG-UNIT TABS tablet Take by mouth.   Yes [provider]  cycloSPORINE (RESTASIS) 0.05 % ophthalmic emulsion INSTILL 1 DROP INTO EACH EYE TWICE  DAILY 01/29/20  Yes [provider]  estradiol (ESTRACE) 0.1 MG/GM vaginal cream SMARTSIG:Gram(s) Vaginal 3 Times a Week 08/17/20  Yes [provider]  fenofibrate (TRICOR) 145 MG tablet Take by mouth. 06/27/20  Yes [provider]  fexofenadine (ALLEGRA) 180 MG tablet Take 1 tablet (180 mg total) by mouth daily. 09/12/20  Yes Rodriguez-Southworth, Viviana Simpler  FLOVENT HFA 110 MCG/ACT inhaler SMARTSIG:2 Puff(s) By Mouth Twice Daily 05/09/20  Yes [provider]  furosemide (LASIX) 20 MG tablet Take by mouth. 07/23/19  Yes [provider]  hydroxyurea (HYDREA) 500 MG capsule Take by mouth. 02/26/22  Yes [provider]  Lancets (FREESTYLE) lancets Check blood sugar twice daily 12/27/20  Yes [provider]  LORazepam (ATIVAN) 0.5 MG tablet Take by mouth. 07/31/19  Yes [provider]  meclizine (ANTIVERT) 12.5 MG tablet Take 12.5 mg by mouth 2 (two) times daily as needed. 01/18/21  Yes [provider]  meloxicam (MOBIC) 7.5 MG tablet Take 1 tablet by mouth daily. 01/26/20  Yes [provider]  metFORMIN (GLUCOPHAGE) 500 MG tablet Take 500 mg by mouth daily. 08/31/20  Yes [provider]  methocarbamol (ROBAXIN) 500 MG tablet Take 1 tablet (500 mg total) by mouth 2 (two) times daily. 01/14/22  Yes Brimage, Vondra, DO  metoprolol succinate (TOPROL-XL) 50  MG 24 hr tablet Take 50 mg by mouth 2 (two) times daily. 09/08/19  Yes [provider]  mupirocin cream (BACTROBAN) 2 % Apply 1 Application topically 2 (two) times daily. 07/07/22  Yes Lorre Munroe, NP  spironolactone (ALDACTONE) 25 MG tablet Take 25 mg by mouth daily. 08/06/20  Yes [provider]  triamcinolone cream (KENALOG) 0.1 % Apply 1 Application topically 2 (two) times daily. 07/07/22  Yes Lorre Munroe, NP  UNABLE TO FIND Med Name: HCTZ 10 mg   Yes [provider]  Cyanocobalamin 2000 MCG TBCR Take by mouth.    [provider]  fluticasone (FLONASE) 50 MCG/ACT nasal spray 1 spray into each nostril daily. 12/27/20 12/27/21  [provider]  ketoconazole (NIZORAL) 2 % cream Apply topically daily. 03/11/20   [provider]  lisinopril (ZESTRIL) 30 MG tablet Take 1 tablet by mouth daily. 12/29/20 12/29/21  [provider]    Family History History reviewed. No pertinent family history.  Social History Social History   Tobacco Use   Smoking status: Never   Smokeless tobacco: Never  Vaping Use   Vaping Use: Never used  Substance Use Topics   Alcohol use: Never   Drug use: Never     Allergies   Amoxicillin, Ciprofloxacin, Keflex [cephalexin], Hydrocodone-acetaminophen, and Sulfamethoxazole   Review of Systems Review of Systems  Constitutional:  Negative for chills, fatigue and fever.  Respiratory:  Negative for chest tightness and shortness of breath.   Cardiovascular:  Negative for chest pain.  Gastrointestinal:  Negative for diarrhea, nausea and vomiting.  Musculoskeletal:  Negative for arthralgias and myalgias.  Skin:  Positive for rash.  Neurological:  Negative for dizziness, weakness and numbness.     Physical Exam Triage Vital Signs ED Triage Vitals  Enc Vitals Group     BP 07/07/22 0908 (!) 149/76     Pulse Rate 07/07/22 0908 85     Resp --      Temp 07/07/22 0908 98.2 F (36.8 C)     Temp Source 07/07/22 0908 Oral     SpO2 07/07/22 0908 93 %     Weight 07/07/22 0904 171 lb (77.6 kg)     Height 07/07/22 0904 5\' 2"  (1.575 m)     Head Circumference --      Peak Flow --      Pain Score 07/07/22 0904 0     Pain Loc --      Pain Edu? --      Excl. in GC? --    No data found.  Updated Vital Signs BP (!) 149/76 (BP Location: Left Arm)   Pulse 85   Temp 98.2 F (36.8 C) (Oral)   Ht 5\' 2"  (1.575 m)   Wt 171 lb (77.6 kg)   SpO2 93%   BMI 31.28 kg/m     Physical Exam Constitutional:      Appearance: She is obese.  HENT:     Head: Normocephalic.   Cardiovascular:     Rate and Rhythm: Normal rate and regular rhythm.     Heart sounds: Normal heart sounds.  Pulmonary:     Effort: Pulmonary effort is normal.     Breath sounds: Normal breath sounds.  Skin:    Comments: She has a 1 cm papule with surrounding erythema noted to the left upper chest.  At her midline back, she has three 1 cm papules with excoriation noted.  No drainage noted.  Neurological:  Mental Status: She is alert and oriented to person, place, and time.     Motor: No weakness.     Gait: Gait normal.      UC Treatments / Results  Labs  EKG   Radiology    Medications Ordered in UC Medications - No data to display  Initial Impression / Assessment and Plan / UC Course  I have reviewed the triage vital signs and the nursing notes.  Pertinent labs & imaging results that were available during my care of the patient were reviewed by me and considered in my medical decision making (see chart for details).     82 year old female with 1 week history of rash.  Rash appears to be bug bites, likely mosquitoes.  Exam is not consistent with shingles.  She reports that she is extremely allergic to insect bites and these often get infected although there are no signs of infection on today's exam.  Will treat with triamcinolone cream 0.1% twice daily mixed with mupirocin 2% twice daily for the next 7 days.  Advised to return if symptoms persist or worsen.  Final Clinical Impressions(s) / UC Diagnoses   Final diagnoses:  Insect bite of left front wall of thorax, initial encounter  Insect bite of back wall of thorax, unspecified location, initial encounter     Discharge Instructions      You are seen today for rash.  This does not appear to be shingles but instead appears to be insect bites.  I am prescribing you steroid and antibiotic cream that I want you to mix together and apply to the lesions twice daily.  Please follow-up with your PCP if symptoms persist or  worsen.     ED Prescriptions     Medication Sig Dispense Auth. Provider   triamcinolone cream (KENALOG) 0.1 % Apply 1 Application topically 2 (two) times daily. 30 g Lorre Munroe, NP   mupirocin cream (BACTROBAN) 2 % Apply 1 Application topically 2 (two) times daily. 15 g Lorre Munroe, NP      PDMP not reviewed this encounter.   Lorre Munroe, NP 07/07/22 0920    Lorre Munroe, NP 07/07/22 [phone]

## 2022-07-07 NOTE — ED Triage Notes (Signed)
Pt c/o body rash x1week  Pt states that the rash is along her neck and has spread down to her lower back.   Pt took tylenol this morning.  Pt states that her blood pressure was elevated this morning and she took a lisinopril and half a metoprolol.

## 2022-07-07 NOTE — Discharge Instructions (Signed)
You are seen today for rash.  This does not appear to be shingles but instead appears to be insect bites.  I am prescribing you steroid and antibiotic cream that I want you to mix together and apply to the lesions twice daily.  Please follow-up with your PCP if symptoms persist or worsen.

## 2022-07-11 ENCOUNTER — Ambulatory Visit: Admit: 2022-07-11 | Discharge: 2022-07-12 | Payer: MEDICARE

## 2022-07-11 DIAGNOSIS — B354 Tinea corporis: Principal | ICD-10-CM

## 2022-07-11 DIAGNOSIS — W57XXXD Bitten or stung by nonvenomous insect and other nonvenomous arthropods, subsequent encounter: Principal | ICD-10-CM

## 2022-07-11 DIAGNOSIS — S20362D Insect bite (nonvenomous) of left front wall of thorax, subsequent encounter: Principal | ICD-10-CM

## 2022-07-11 MED ORDER — NYSTATIN 100,000 UNIT/GRAM TOPICAL CREAM
Freq: Two times a day (BID) | TOPICAL | 2 refills | 0 days | Status: CP
Start: 2022-07-11 — End: 2023-07-11

## 2022-07-11 NOTE — Unmapped (Signed)
Assessment and Plan:     Cindy Gonzalez. [place] was seen today for rash.    Diagnoses and all orders for this visit:    Tinea corporis          -     Home care advice/reassurance: rx for topical anti-fungal cream  -     nystatin (MYCOSTATIN) 100,000 unit/gram cream; Apply topically two (2) times a day.    Insect bite of left front wall of thorax, subsequent encounter          - Areas improving-steroid cream, anti-histamine, hydration to skin        - Return instructions reviewed with pt        - Patient verbalized understanding, agreeable with plan of care.     Barriers to recommended plan: None identified    Return if symptoms worsen or fail to improve, for As scheduled with [facility].      Subjective:     HPI: Cindy Gonzalez is a 82 y.o. female here for Rash (Chest area and on her back. ).    Rash, inflamed insect bites:  Reaction to bug bites, seen by urgent care provider 07/07/22--given steroid cream and mupirocin cream.  Thinks bitten by insect when sitting outside  Red and itching patch of dry skin on upper and left side of chest x 3-4 months   Has been using rubbing alcohol,  steroid cream on area  Thinks insect bites on lower part of back improving and less itching  No fever, no chills, no vomiting, no nausea    I have reviewed past medical, surgical, medications, allergies, social and family histories today and updated them in Epic where appropriate.    ROS:     Review of systems negative unless otherwise noted as per HPI.      Objective:     Vitals:    07/11/22 0917   BP: 136/72   Pulse: 86   Temp: 36.6 ??C (97.8 ??F)   SpO2: 95%     Body mass index is 32.12 kg/m??.    Physical Exam  Vitals and nursing note reviewed.   Constitutional:       Appearance: Normal appearance.   Skin:     Capillary Refill: Capillary refill takes less than 2 seconds.      Comments: Lower to mid back: x 2-3 diffuse, mildly inflamed insect bites noted   No cellulitis, no swelling, no pain      Upper, anterior chest: erythematous patch of skin 5 cm by 3 cm , inflamed papules noted on superior portion of circular rash area  No swelling, no warmth, no drainage   Neurological:      General: No focal deficit present.      Mental Status: She is alert and oriented to person, place, and time. Mental status is at baseline.   Psychiatric:         Mood and Affect: Mood normal.         Behavior: Behavior normal.         Thought Content: Thought content normal.         Judgment: Judgment normal.            Medication adherence and barriers to the treatment plan have been addressed. Opportunities to optimize healthy behaviors have been discussed. Patient / caregiver voiced understanding.   I personally spent 25 minutes face-to-face and non-face-to-face in the care of this patient, which includes all pre, intra, and post visit time on  the date of service.  Cleon Dew, DNP, FNP-C  [facility]  [phone] (F)    Note - This record has been created using [employer]. Chart creation errors have been sought, but may not always have been located. Such creation errors do not reflect on the standard of medical care.

## 2022-07-11 NOTE — Unmapped (Signed)
[facility] Pharmacist has reviewed a new prescription for Hydroxyurea that indicates a dose decrease.  Patient was counseled on this dosage change by Arville Lime- see epic note from verbal.  Next refill call date adjusted if necessary.

## 2022-07-23 LAB — CBC W/ AUTO DIFF
BASOPHILS ABSOLUTE COUNT: 0 10*9/L (ref 0.0–0.1)
BASOPHILS RELATIVE PERCENT: 0.7 %
EOSINOPHILS ABSOLUTE COUNT: 0 10*9/L (ref 0.0–0.5)
EOSINOPHILS RELATIVE PERCENT: 0.4 %
HEMATOCRIT: 37 % (ref 34.0–44.0)
HEMOGLOBIN: 12.5 g/dL (ref 11.3–14.9)
LYMPHOCYTES ABSOLUTE COUNT: 0.8 10*9/L — ABNORMAL LOW (ref 1.1–3.6)
LYMPHOCYTES RELATIVE PERCENT: 34.7 %
MEAN CORPUSCULAR HEMOGLOBIN CONC: 33.8 g/dL (ref 32.0–36.0)
MEAN CORPUSCULAR HEMOGLOBIN: 40.4 pg — ABNORMAL HIGH (ref 25.9–32.4)
MEAN CORPUSCULAR VOLUME: 119.6 fL — ABNORMAL HIGH (ref 77.6–95.7)
MEAN PLATELET VOLUME: 7.8 fL (ref 6.8–10.7)
MONOCYTES ABSOLUTE COUNT: 0.2 10*9/L — ABNORMAL LOW (ref 0.3–0.8)
MONOCYTES RELATIVE PERCENT: 8.5 %
NEUTROPHILS ABSOLUTE COUNT: 1.3 10*9/L — ABNORMAL LOW (ref 1.8–7.8)
NEUTROPHILS RELATIVE PERCENT: 55.7 %
NUCLEATED RED BLOOD CELLS: 0 /100{WBCs} (ref <=4)
PLATELET COUNT: 141 10*9/L — ABNORMAL LOW (ref 150–450)
RED BLOOD CELL COUNT: 3.09 10*12/L — ABNORMAL LOW (ref 3.95–5.13)
RED CELL DISTRIBUTION WIDTH: 12.8 % (ref 12.2–15.2)
WBC ADJUSTED: 2.4 10*9/L — ABNORMAL LOW (ref 3.6–11.2)

## 2022-07-23 LAB — COMPREHENSIVE METABOLIC PANEL
ALBUMIN: 3.9 g/dL (ref 3.4–5.0)
ALKALINE PHOSPHATASE: 39 U/L — ABNORMAL LOW (ref 46–116)
ALT (SGPT): 27 U/L (ref 10–49)
ANION GAP: 5 mmol/L (ref 5–14)
AST (SGOT): 27 U/L (ref <=34)
BILIRUBIN TOTAL: 0.4 mg/dL (ref 0.3–1.2)
BLOOD UREA NITROGEN: 27 mg/dL — ABNORMAL HIGH (ref 9–23)
BUN / CREAT RATIO: 27
CALCIUM: 9.6 mg/dL (ref 8.7–10.4)
CHLORIDE: 113 mmol/L — ABNORMAL HIGH (ref 98–107)
CO2: 25.4 mmol/L (ref 20.0–31.0)
CREATININE: 1 mg/dL
EGFR CKD-EPI (2021) FEMALE: 56 mL/min/{1.73_m2} — ABNORMAL LOW (ref >=60)
GLUCOSE RANDOM: 207 mg/dL — ABNORMAL HIGH (ref 70–179)
POTASSIUM: 4.6 mmol/L (ref 3.4–4.8)
PROTEIN TOTAL: 6.8 g/dL (ref 5.7–8.2)
SODIUM: 143 mmol/L (ref 135–145)

## 2022-07-23 LAB — LACTATE DEHYDROGENASE: LACTATE DEHYDROGENASE: 198 U/L (ref 120–246)

## 2022-07-24 ENCOUNTER — Institutional Professional Consult (permissible substitution): Admit: 2022-07-24 | Discharge: 2022-07-25 | Payer: MEDICARE | Attending: Adult Health | Primary: Adult Health

## 2022-07-24 NOTE — Unmapped (Signed)
ID:  Cindy Gonzalez is an 82 y.o. with PCV    DZ CHAR:   5.6/17.8/55.8/243; K 5.3; LDH: 248  Epo < 1  Smear: No immature forms; nl RBC morphology; smudge cells; reactive lymph   Ferritin: 27.5  LDH: 399  No h/o thrombosis.     Variants of Known/Likely Clinical Significance:   Gene Coding Predicted Protein Variant allele fraction   IDH2 c.419G>A p.(Arg140Gln) 26.7%   JAK2 c.1849G>T p.(Val617Phe) 69.8%   TET2 c.4045-2A>G p.(?) 13.4%      ASSESSMENT:   Cindy Gonzalez is an 82 y.o. w/ a h/o erythrocytosis.  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  A bone marrow biopsy is not needed to make the diagnosis though it may offer additional prognostic information.  She also has an IDH2 and TET2 mutation, conveying worse prognosis.    She is tolerating Hydrea well.  We are currently targeting a HCT of < 42.5.  Thus we can reduce the dose.  She may be at some risk of developing cyclic thrombocytopenia.  At this point, I would simply follow it.      We reviewed the reasons for treatment and the plan below.     PLAN:   1) Hydrea 500 mg twice a day on Mon to Fri and one 500 mg once a day on Sat and Sun.  This is a dose reduction of 17%.    2) Labs in 6 weeks  in [place] (The week of July 22); I will have Markus Jarvis do a phone visit (she does not do video visits)  3) RTC in 3 months   4) I recommended S/S rinses.   5) Low dose aspirin once a day.     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    04/2020: 3.2/13.7/41.1/225; ANC: 1.9; ALC: 1.0  02/2021: CTA: Negative; B12: 1,251  04/2021: TTE: EF 35-40%; 5.5/14.6/44.3/268; LDH: 234  10/14/21: 8.0/17.7/55.4/265; AMC: 0.1; ALC: 0.5; K: 5.0  10/19/21: JAK2: 64.9; Epo < 1  11/08/21: 5.6/17.8/55.8/243; K 5.3; LDH: 248  02/06/22: Seen by [facility]  7.8/19.5/59.7/262  Smear: No immature forms; nl RBC morphology; smudge cells; reactive lymph   Myeloid mutation panel: IDH2, JAK2, TET2  Ferritin: 27.5  LDH: 399  02/14/22: Started Hydroxyurea 3.5 g/wk  6.5/18.8/58.4/236; MCV: 84.1  02/23/22: Therapeutic phlebotomy   5.9/19.5/60.6/261  Ferritin: 25.3  02/26/22: 6.3/18.4/57.2/262; Hydrea 5.5 g/wk  03/09/22: Phlebotomy;   4.3/17.8/.55.4/153;  Ferritin: 18.3  03/17/22: 5.0/17.7/54.9/114; MCV: 87.5  04/10/22:Phlebotomy; 3.0/17.2/52.2/145; Hydrea 7 g/wk;   MCV: 90.0  ferritin: 265.5  06/12/22:: 2.7/11.8/34.2/115; ANC 1.5: Hydrea reduced 6 g/wk     INTERVAL HX:  Cindy Gonzalez comes for follow-up of her PCV.  We talked about the following:   She notes some sneezing and coughing with mild shortness of breath.   She has not had any mouth sores; no rashes, no GI sx  No B sx  No MPN sx  No ID sx  No adherence problems  The rest of her ROS was negative.     MPN 10 Score  (App: [email or web address])   [email or web address]  (Useful for monitoring patient symptoms.    Symptom Range Pre-Tx  Fatigue in the past 24 hours (Absent) 0 1 2 3 4 5 6 7 8 9  10 (Worst Imaginable) 0   Filling up quickly when you eat (Early satiety)  (Absent) 0 1 2 3 4 5 6 7 8 9  10 (Worst Imaginable) 0   Abdominal discomfort  (Absent) 0 1 2 3 4 5 6 7 8 9  10 (Worst Imaginable) 0   Inactivity (Absent) 0 1 2 3 4 5 6 7 8 9  10 (Worst Imaginable) Uses walking stick; slowed done arthritis 1   Problems with concentration - (Absent) 0 1 2 3 4 5 6 7 8 9  10 (Worst Imaginable) 0   Numbness/ Tingling (in my hands and feet) (Absent) 0 1 2 3 4 5 6 7 8 9  10 (Worst Imaginable) occ in feet 1 - 2   Night sweats (Absent) 0 1 2 3 4 5 6 7 8 9  10 (Worst Imaginable) She notes a little sweatiness with her cholesterol medicine 1   Itching (pruritus) (Absent) 0 1 2 3 4 5 6 7 8 9  10 (Worst Imaginable) 1   Bone pain (diffuse not joint pain or arthritis) (Absent) 0 1 2 3 4 5 6 7 8 9  10 (Worst Imaginable) It is difficult to separate bone from arthritis pain    Fever (>100 F) (Absent) 0 1 2 3 4 5 6 7 8 9  10 (Daily) 0   Unintentional weight loss last 6 months (Absent) 0 1 2 3 4 5 6 7 8 9  10 (Worst Imaginable) With DM meds 16 lbs      5     PHYSICAL EXAM:  VS: As recorded in Epic  GENERAL: She appears well   HEENT: OP is clear  LYMPH NODES:  No LAN  LUNGS: CTA; mild kyphosis  COR: RRR w/o m/r/g  ABD: NTND; no HSM  EXT: Mild venous insufficiency     PMHx of pancreatitis, hepatitis, T2DM, atelectasis, acute respiratory failure with hypoxemia, nonischemic cardiomyopathy, HTN, aortic stenosis, heart failure, CAD, essential tremor, osteoarthritis, vertigo, iron deficiency anemia, insomnia, dyslipidemia, Diffuse large B cell lymphoma.      Primary prophylaxis of thrombosis  All patients: Aspirin unless    VWF activity <30%,   Platelet >?1 million,   CALR-mutated low-risk ET    PV with Hct >?45%: Add phlebotomy/cytoreduction to target Hct <?45%  Age >?60 years and/or prior history of thrombosis: Add cytoreduction  Secondary prophylaxis after thrombotic event  All patients: Cytoreduction  Typical VTE: Consider indefinite VKA for most patients; aspirin if not on VKA  Atypical VTE: Indefinite VKA  Arterial thrombosis: Aspirin    PCV Risk  Age > 60:    1   No: 0   Yes: 1    History of thrombosis:  0   No: 0   Yes: 1    Low Risk: 0 points  High Risk 1 -2 points    Low Risk patients treat with aspirin every day;   High Risk patients treat with aspirin BID*  * Consider BID aspirin for patients with microvascular complications, additional CV risk factors, or leukocytosis    PV:   Major criteria  Hgb/Hct  Men: >?16.5?g/dL or 25% or inc red blood cell mass  Women: > 16 g/dL or 36% or inc red blood cell mass  Inc in RBC Mass > 25%  Presence of JAK2 or JAK2 exon 12 mutation   BM biopsy showing hypercellularity for age with trilineage growth (panmyelosis) including prominent erythroid, granulocytic and  megakaryocytic proliferation with pleomorphic, mature megakaryocytes (differences in size, without atypia)     Minor criteria  Subnormal serum erythropoietin level     Diagnosis: All 3 major or first 2 major and 1 minor    Post-PV MF  Required criteria  Previous dx of PCV  BM Fibrosis of grade 2/3    Additional  Anemia or loss of need for cytoreduction  Leukoerythroblastosis  Splenomegaly > 5 cm from baseline or new splenomegaly  2 or more constitutional sx: > 10% wt loss in 6 mths, NS, fever > 37.5

## 2022-07-24 NOTE — Unmapped (Signed)
Contacted patient via telephone for upcoming virtual visit with provider. Reviewed medications and allergies with patient and updated the chart. Reviewed with patient that provider will be calling her on the phone for her visit.Patient verbalized understanding.

## 2022-07-24 NOTE — Unmapped (Signed)
[facility] ED Post-Discharge Call    5 or more prescribed daily medications    Contacted patient at [phone] to discuss ED visit. Confirmed patient identity.     We want to ensure you understood your plan of care. Review AVS instructions. Did your discharge instructions answer all of your questions? yes  Have you made a follow-up appointment? yes  Have you filled your prescriptions (if applicable)? N/A  Do you have any questions regarding your prescriptions?N/A  Do you have any other questions? Yes  Return precautions discussed.

## 2022-07-24 NOTE — Unmapped (Signed)
This patient visit was completed through the use of an audio/video or telephone encounter.    I spent 15 minutes on the phone with the patient. I spent an additional 15 minutes on pre- and post-visit activities.     The patient was physically located in [place] or a state in which I am permitted to provide care. The patient understood that s/he may incur co-pays and cost sharing, and agreed to the telemedicine visit. The visit was completed via phone and/or video, which was appropriate and reasonable under the circumstances given the patient's presentation at the time.    The patient has been advised of the potential risks and limitations of this mode of treatment (including, but not limited to, the absence of in-person examination) and has agreed to be treated using telemedicine. The patient's/patient's family's questions regarding telemedicine have been answered.     If the phone/video visit was completed in an ambulatory setting, the patient has also been advised to contact their provider???s office for worsening conditions, and seek emergency medical treatment and/or call 911 if the patient deems either necessary.    Visit conducted by: Telephone  Person contacted: Venetia Constable Ayad  Contact phone number: [phone]  Is there someone else in the room? no       ID:  Ms Guile is an 82 y.o. with PCV    DZ CHAR:   5.6/17.8/55.8/243; K 5.3; LDH: 248  Epo < 1  Smear: No immature forms; nl RBC morphology; smudge cells; reactive lymph   Ferritin: 27.5  LDH: 399  No h/o thrombosis.     Variants of Known/Likely Clinical Significance:   Gene Coding Predicted Protein Variant allele fraction   IDH2 c.419G>A p.(Arg140Gln) 26.7%   JAK2 c.1849G>T p.(Val617Phe) 69.8%   TET2 c.4045-2A>G p.(?) 13.4%      ASSESSMENT:   Ms Mulroney is an 82 y.o. w/ a h/o erythrocytosis.  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  A bone marrow biopsy is not needed to make the diagnosis though it may offer additional prognostic information.  She also has an IDH2 and TET2 mutation, conveying worse prognosis.    Overall Ms. Gambrell is tolerating hydrea well.  However, her ANC continue to fall and is 1.3 today.  Given excellent control of her hmct (37%), will plan to reduced her hydrea by 500g/week.  She has an upcoming appointment for her lymphoma and we will check labs at that time.  She has no MPN symptoms.    PLAN:   1) Reduce Hydrea 500 mg twice a day on Mon to Thurs and one 500 mg once a day Fri, Sat, and Sun.    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    04/2020: 3.2/13.7/41.1/225; ANC: 1.9; ALC: 1.0  02/2021: CTA: Negative; B12: 1,251  04/2021: TTE: EF 35-40%; 5.5/14.6/44.3/268; LDH: 234  10/14/21: 8.0/17.7/55.4/265; AMC: 0.1; ALC: 0.5; K: 5.0  10/19/21: JAK2: 64.9; Epo <  1  11/08/21: 5.6/17.8/55.8/243; K 5.3; LDH: 248  02/06/22: Seen by [facility]EME  7.8/19.5/59.7/262  Smear: No immature forms; nl RBC morphology; smudge cells; reactive lymph   Myeloid mutation panel: IDH2, JAK2, TET2  Ferritin: 27.5  LDH: 399  02/14/22: Started Hydroxyurea 3.5 g/wk  6.5/18.8/58.4/236; MCV: 84.1  02/23/22: Therapeutic phlebotomy   5.9/19.5/60.6/261  Ferritin: 25.3  02/26/22: 6.3/18.4/57.2/262; Hydrea 5.5 g/wk  03/09/22: Phlebotomy;   4.3/17.8/.55.4/153;  Ferritin: 18.3  03/17/22: 5.0/17.7/54.9/114; MCV: 87.5  04/10/22:Phlebotomy; 3.0/17.2/52.2/145; Hydrea 7 g/wk;   MCV: 90.0  ferritin: 265.5  06/12/22: 2.7/11.8/34.2/115; ANC 1.5: Hydrea reduced 6 g/wk   07/24/22: 2.4/12.5/37.0/141, ANC 1.3; Hydrea reduced to 5.5g/wk    INTERVAL HX:  Ms Moffit comes for follow-up of her PCV.  We talked about the following:   She does feel like her muscle and joint pain  She notes some sneezing and coughing with mild shortness of breath.   She has not had any mouth sores; no rashes, no GI sx  No B sx  No MPN sx  No ID sx  No adherence problems  The rest of her ROS was negative.     MPN 10 Score  (App: [email or web address])   [email or web address]  (Useful for monitoring patient symptoms.    Symptom Range Pre-Tx   Fatigue in the past 24 hours (Absent) 0 1 2 3 4 5 6 7 8 9  10 (Worst Imaginable) 0   Filling up quickly when you eat (Early satiety)  (Absent) 0 1 2 3 4 5 6 7 8 9  10 (Worst Imaginable) 0   Abdominal discomfort  (Absent) 0 1 2 3 4 5 6 7 8 9  10 (Worst Imaginable) 0   Inactivity (Absent) 0 1 2 3 4 5 6 7 8 9  10 (Worst Imaginable) Uses walking stick; slowed done arthritis 1   Problems with concentration - (Absent) 0 1 2 3 4 5 6 7 8 9  10 (Worst Imaginable) 0   Numbness/ Tingling (in my hands and feet) (Absent) 0 1 2 3 4 5 6 7 8 9  10 (Worst Imaginable) occ in feet 1 - 2   Night sweats (Absent) 0 1 2 3 4 5 6 7 8 9  10 (Worst Imaginable) She notes a little sweatiness with her cholesterol medicine 1   Itching (pruritus) (Absent) 0 1 2 3 4 5 6 7 8 9  10 (Worst Imaginable) 1   Bone pain (diffuse not joint pain or arthritis) (Absent) 0 1 2 3 4 5 6 7 8 9  10 (Worst Imaginable) It is difficult to separate bone from arthritis pain    Fever (>100 F) (Absent) 0 1 2 3 4 5 6 7 8 9  10 (Daily) 0   Unintentional weight loss last 6 months (Absent) 0 1 2 3 4 5 6 7 8 9  10 (Worst Imaginable) With DM meds 16 lbs      5     PHYSICAL EXAM:  VS: As recorded in Epic  GENERAL:  The patient sounds well and is in no acute distress.  RESP: Breathing comfortably and unlabored speech.    NEURO: A&Ox4  PSYCH: Normal affect      PMHx of pancreatitis, hepatitis, T2DM, atelectasis, acute respiratory failure with hypoxemia, nonischemic cardiomyopathy, HTN, aortic stenosis, heart failure, CAD, essential tremor, osteoarthritis, vertigo, iron deficiency anemia, insomnia, dyslipidemia, Diffuse large B cell lymphoma.      Primary prophylaxis of thrombosis  All patients: Aspirin unless    VWF activity <30%,   Platelet >?1 million,  CALR-mutated low-risk ET    PV with Hct >?45%: Add phlebotomy/cytoreduction to target Hct <?45%  Age >?60 years and/or prior history of thrombosis: Add cytoreduction  Secondary prophylaxis after thrombotic event  All patients: Cytoreduction  Typical VTE: Consider indefinite VKA for most patients; aspirin if not on VKA  Atypical VTE: Indefinite VKA  Arterial thrombosis: Aspirin    PCV Risk  Age > 60:    1   No: 0   Yes: 1    History of thrombosis:  0   No: 0   Yes: 1    Low Risk: 0 points  High Risk 1 -2 points    Low Risk patients treat with aspirin every day;   High Risk patients treat with aspirin BID*  * Consider BID aspirin for patients with microvascular complications, additional CV risk factors, or leukocytosis    PV:   Major criteria  Hgb/Hct  Men: >?16.5?g/dL or 16% or inc red blood cell mass  Women: > 16 g/dL or 10% or inc red blood cell mass  Inc in RBC Mass > 25%  Presence of JAK2 or JAK2 exon 12 mutation   BM biopsy showing hypercellularity for age with trilineage growth (panmyelosis) including prominent erythroid, granulocytic and megakaryocytic proliferation with pleomorphic, mature megakaryocytes (differences in size, without atypia)     Minor criteria  Subnormal serum erythropoietin level     Diagnosis: All 3 major or first 2 major and 1 minor    Post-PV MF  Required criteria  Previous dx of PCV  BM Fibrosis of grade 2/3    Additional  Anemia or loss of need for cytoreduction  Leukoerythroblastosis  Splenomegaly > 5 cm from baseline or new splenomegaly  2 or more constitutional sx: > 10% wt loss in 6 mths, NS, fever > 37.5

## 2022-07-29 ENCOUNTER — Ambulatory Visit
Admission: EM | Admit: 2022-07-29 | Discharge: 2022-07-29 | Disposition: A | Discharge status: Home / Self Care | Payer: Medicare HMO | Attending: Family Medicine | Admitting: Family Medicine

## 2022-07-29 DIAGNOSIS — G8929 Other chronic pain: Secondary | ICD-10-CM | POA: Insufficient documentation

## 2022-07-29 DIAGNOSIS — M545 Low back pain, unspecified: Secondary | ICD-10-CM | POA: Diagnosis not present

## 2022-07-29 LAB — VITAMIN B12: Vitamin B-12: 328 pg/mL (ref 180–914)

## 2022-07-29 MED ORDER — KETOROLAC TROMETHAMINE 60 MG/2ML IM SOLN
15.0000 mg | Freq: Once | INTRAMUSCULAR | Status: AC
  Administered 2022-07-29: 15 mg via INTRAMUSCULAR

## 2022-07-29 NOTE — ED Provider Notes (Addendum)
[facility]    CSN: [id number] Arrival date & time: 07/29/22  4098      History   Chief Complaint Chief Complaint  Patient presents with   Back Pain    HPI  HPI Jacqueline Arroyo is a 82 y.o. female.   Jacqueline Arroyo presents for low back pain that started 2 weeks ago.  Pain was intermittent but now its constant.  She went to [facility] ED and they gave her a shot but it only lasted 3 days.  Pain radiates down her leg and to her hips.  She taking Tylenol and applying a heating pad.  No recent fall or injury.  Continues to have  description pain with movement. Jacqueline Arroyo does not feel like her legs are weak.         Past Medical History:  Diagnosis Date   Diabetes mellitus without complication (HCC)    Hypertension     There are no problems to display for this patient.   Past Surgical History:  Procedure Laterality Date   ABDOMINAL HYSTERECTOMY     BACK SURGERY     CHOLECYSTECTOMY     FOOT SURGERY      OB History   No obstetric history on file.      Home Medications    Prior to Admission medications   Medication Sig Start Date End Date Taking? Authorizing Provider  acetic acid 2 % otic solution SMARTSIG:Left Ear 01/19/21  Yes [provider]  albuterol (VENTOLIN HFA) 108 (90 Base) MCG/ACT inhaler Inhale into the lungs. 07/12/19  Yes [provider]  amLODipine (NORVASC) 10 MG tablet Take 10 mg by mouth daily. 09/01/20  Yes [provider]  aspirin 81 MG EC tablet Take by mouth. 07/17/19  Yes [provider]  atorvastatin (LIPITOR) 10 MG tablet Take 1 tablet by mouth daily. 03/01/20  Yes [provider]  calcium-vitamin D (OSCAL WITH D) 500-200 MG-UNIT TABS tablet Take by mouth.   Yes [provider]  Cyanocobalamin 2000 MCG TBCR Take by mouth.   Yes [provider]  cycloSPORINE (RESTASIS) 0.05 % ophthalmic emulsion INSTILL 1 DROP INTO EACH EYE TWICE DAILY 01/29/20  Yes [provider]  estradiol  (ESTRACE) 0.1 MG/GM vaginal cream SMARTSIG:Gram(s) Vaginal 3 Times a Week 08/17/20  Yes [provider]  fenofibrate (TRICOR) 145 MG tablet Take by mouth. 06/27/20  Yes [provider]  fexofenadine (ALLEGRA) 180 MG tablet Take 1 tablet (180 mg total) by mouth daily. 09/12/20  Yes Rodriguez-Southworth, Viviana Simpler  FLOVENT HFA 110 MCG/ACT inhaler SMARTSIG:2 Puff(s) By Mouth Twice Daily 05/09/20  Yes [provider]  furosemide (LASIX) 20 MG tablet Take by mouth. 07/23/19  Yes [provider]  hydroxyurea (HYDREA) 500 MG capsule Take by mouth. 02/26/22  Yes [provider]  ketoconazole (NIZORAL) 2 % cream Apply topically daily. 03/11/20  Yes [provider]  Lancets (FREESTYLE) lancets Check blood sugar twice daily 12/27/20  Yes [provider]  LORazepam (ATIVAN) 0.5 MG tablet Take by mouth. 07/31/19  Yes [provider]  meclizine (ANTIVERT) 12.5 MG tablet Take 12.5 mg by mouth 2 (two) times daily as needed. 01/18/21  Yes [provider]  meloxicam (MOBIC) 7.5 MG tablet Take 1 tablet by mouth daily. 01/26/20  Yes [provider]  metFORMIN (GLUCOPHAGE) 500 MG tablet Take 500 mg by mouth daily. 08/31/20  Yes [provider]  methocarbamol (ROBAXIN) 500 MG tablet Take 1 tablet (500 mg total) by mouth 2 (two)  times daily. 01/14/22  Yes Moshe Wenger, DO  metoprolol succinate (TOPROL-XL) 50 MG 24 hr tablet Take 50 mg by mouth 2 (two) times daily. 09/08/19  Yes [provider]  mupirocin cream (BACTROBAN) 2 % Apply 1 Application topically 2 (two) times daily. 07/07/22  Yes Lorre Munroe, NP  spironolactone (ALDACTONE) 25 MG tablet Take 25 mg by mouth daily. 08/06/20  Yes [provider]  triamcinolone cream (KENALOG) 0.1 % Apply 1 Application topically 2 (two) times daily. 07/07/22  Yes Baity, Salvadore Oxford, NP  UNABLE TO FIND Med Name: HCTZ 10 mg   Yes [provider]  fluticasone (FLONASE) 50  MCG/ACT nasal spray 1 spray into each nostril daily. 12/27/20 12/27/21  [provider]  lisinopril (ZESTRIL) 30 MG tablet Take 1 tablet by mouth daily. 12/29/20 12/29/21  [provider]    Family History History reviewed. No pertinent family history.  Social History Social History   Tobacco Use   Smoking status: Never   Smokeless tobacco: Never  Vaping Use   Vaping status: Never Used  Substance Use Topics   Alcohol use: Never   Drug use: Never     Allergies   Amoxicillin, Ciprofloxacin, Keflex [cephalexin], Hydrocodone-acetaminophen, and Sulfamethoxazole   Review of Systems Review of Systems: egative unless otherwise stated in HPI.      Physical Exam Triage Vital Signs ED Triage Vitals  Encounter Vitals Group     BP 07/29/22 0819 (!) 159/73     Systolic BP Percentile --      Diastolic BP Percentile --      Pulse Rate 07/29/22 0819 87     Resp 07/29/22 0819 16     Temp 07/29/22 0819 97.9 F (36.6 C)     Temp Source 07/29/22 0819 Oral     SpO2 07/29/22 0819 94 %     Weight 07/29/22 0818 174 lb (78.9 kg)     Height --      Head Circumference --      Peak Flow --      Pain Score 07/29/22 0818 10     Pain Loc --      Pain Education --      Exclude from Growth Chart --    No data found.  Updated Vital Signs BP (!) 159/73 (BP Location: Left Arm)   Pulse 87   Temp 97.9 F (36.6 C) (Oral)   Resp 16   Wt 78.9 kg   SpO2 94%   BMI 31.83 kg/m   Visual Acuity Right Eye Distance:   Left Eye Distance:   Bilateral Distance:    Right Eye Near:   Left Eye Near:    Bilateral Near:     Physical Exam GEN: well appearing female in no acute distress  CVS: well perfused, pansystolic murmur, regular reate  RESP: speaking in full sentences without pause, no respiratory distress, clear   MSK:  Lumbar spine: - Inspection: no swelling or ecchymosis. No skin changes  - Palpation: +TTP over the spinous processes and bilateral lumbar paraspinal  muscles, no SI joint tenderness bilaterally, walking cane present - ROM: limited ROM due to pain and age  - gross sensation intact  SKIN: warm, dry, no overly skin rash or erythema    UC Treatments / Results  Labs (all labs ordered are listed, but only abnormal results are displayed) Labs Reviewed  VITAMIN B12    EKG   Radiology No results found.   Procedures Procedures (including critical care  time)  Medications Ordered in UC Medications  ketorolac (TORADOL) injection 15 mg (15 mg Intramuscular Given 07/29/22 0855)    Initial Impression / Assessment and Plan / UC Course  I have reviewed the triage vital signs and the nursing notes.  Pertinent labs & imaging results that were available during my care of the patient were reviewed by me and considered in my medical decision making (see chart for details).      Pt is a 82 y.o.  female presented for acute on chronic back pain.  Has history of DDD with chronic low back pain.     On chart review, pt seen at [facility] ED for similar pain last month. She had lumbar disc narrowing, endplate sclerosis and osteophytosis greatest at the L4-L5 and L5-S1 locations with facet arthropathy.  Seen on xray.  A referral was placed to spine clinic.  She goes to the spine clinic on Aug 15th.  Her CBC showed macrocytosis. Will obtain a Vit B12 level as she was previously taking this medication.    Imaging deferred here. Obtain Vit B12 as deficiency in this may be complicating her pain. Given Toradol 15 mg IM.  Recent serum Cr reviewed.   Patient to gradually return to normal activities, as tolerated and continue ordinary activities within the limits permitted by pain. She will take a 1/2 dose of her home Tramadol as she doesn't like the feeling of the full dose. Declines new Rx.  Tylenol and Lidocaine patches PRN for multimodal pain relief. Counseled patient on red flag symptoms and when to seek immediate care. Patient to follow up with spine  clinic as scheduled. Return and ED precautions given.    Discussed MDM, treatment plan and plan for follow-up with patient who agrees with plan.   Final Clinical Impressions(s) / UC Diagnoses   Final diagnoses:  Chronic bilateral low back pain, unspecified whether sciatica present     Discharge Instructions      You were given a shot for pain here. Take Tylenol 1000 mg three times a day for pain. Lidocaine patches can provide additional relief.   Take 1/2 tablet of your home Tramadol as needed for severe pain. Be sure to keep your follow up appointment with the spine clinic as scheduled.   If your Vitamin B12 level is low, someone will contact you.     ED Prescriptions   None    PDMP not reviewed this encounter.      Katha Cabal, DO 07/29/22 [phone]

## 2022-07-29 NOTE — Discharge Instructions (Addendum)
You were given a shot for pain here. Take Tylenol 1000 mg three times a day for pain. Lidocaine patches can provide additional relief.   Take 1/2 tablet of your home Tramadol as needed for severe pain. Be sure to keep your follow up appointment with the spine clinic as scheduled.   If your Vitamin B12 level is low, someone will contact you.

## 2022-07-29 NOTE — ED Triage Notes (Signed)
Patient states that she's having back pain x 2 weeks. Lower and upper. Lower radiates to hips. Has appointment with spine clinic on 8-15. She took 2 tylenol this morning with no relief. Went to [facility] ER 3 weeks ago that helped some.

## 2022-08-02 DIAGNOSIS — G4700 Insomnia, unspecified: Principal | ICD-10-CM

## 2022-08-02 DIAGNOSIS — G250 Essential tremor: Principal | ICD-10-CM

## 2022-08-02 MED ORDER — LORAZEPAM 0.5 MG TABLET
0 refills | 0 days | Status: CP
Start: 2022-08-02 — End: ?

## 2022-08-15 NOTE — Unmapped (Signed)
Assessment/Plan:    Diarra was seen today for toe pain.    Diagnoses and all orders for this visit:    Callus of foot  -     Ambulatory referral to Podiatry; Future    The pt is seen today with concerns of corn on her toe.  She has a tender callous medial aspect of right great toe. She is a diabetic and she has been reluctant to use OTC Duofilm since she has difficulties reaching her toe and she is a diabetic.    Physical exam today reveals tender callous located medial aspect of right great toe. She is referred to [facility].      Return if symptoms worsen or fail to improve.        Subjective:     HPI  The pt is seen today with concerns of corn on her toe.  She has a tender callous medial aspect of right great toe. She is a diabetic and she has been reluctant to use OTC Duofilm since she has difficulties reaching her toe and she is a diabetic.      ROS  Constitutional:  Denies  unexpected weight loss or gain, or weakness   Eyes:  Denies visual changes  Respiratory:  Denies cough or shortness of breath. No change in exercise  tolerance  Cardiovascular:  Denies chest pain, palpitations or lower extremity swelling   GI:  Denies abdominal pain, diarrhea, constipation   Musculoskeletal:  Denies myalgias  Skin:  Denies nonhealing lesions  Neurologic:  Denies headache, focal weakness or numbness, tingling  Endocrine:  Denies polyuria or polydypsia   Psychiatric:  Denies depression, anxiety      Outpatient Medications Prior to Visit   Medication Sig Dispense Refill    albuterol HFA 90 mcg/actuation inhaler Inhale 2 puffs every six (6) hours as needed for wheezing. 18 g 0    amlodipine (NORVASC) 10 MG tablet Take 1 tablet (10 mg total) by mouth daily. 90 tablet 3    atorvastatin (LIPITOR) 10 MG tablet Take 1 tablet (10 mg total) by mouth daily. 90 tablet 3    blood sugar diagnostic (ACCU-CHEK GUIDE TEST STRIPS) Strp by Other route Two (2) times a day (30 minutes before a meal). 200 each 1    calcium-vitamin D 500 mg(1,250mg ) -200 unit per tablet Take 1 tablet by mouth in the morning and 1 tablet in the evening. Take with meals.      cyanocobalamin 2000 MCG tablet Take 1 tablet (2,000 mcg total) by mouth daily.      estradiol (ESTRACE) 0.01 % (0.1 mg/gram) vaginal cream Insert 2 g into the vagina daily. Use externally prn 42.5 g 11    fenofibrate (TRICOR) 145 MG tablet Take 1 tablet (145 mg total) by mouth daily. 90 tablet 3    fluticasone propionate (FLONASE) 50 mcg/actuation nasal spray 1 spray into each nostril daily. 16 g 11    furosemide (LASIX) 20 MG tablet TAKE 1 TABLET BY MOUTH THREE TIMES A WEEK 36 tablet 0    ketoconazole (NIZORAL) 2 % cream APPLY  CREAM TOPICALLY ONCE DAILY FOR SCALP RASH 30 g 0    lancets (ACCU-CHEK SOFTCLIX LANCETS) Misc Use as directed to check glucose twice per day 200 each 5    lisinopril (PRINIVIL,ZESTRIL) 30 MG tablet Take 1 tablet (30 mg total) by mouth daily. 90 tablet 3    LORazepam (ATIVAN) 0.5 MG tablet TAKE 1/2 (ONE-HALF) TABLET BY MOUTH TWICE DAILY AS NEEDED FOR ANXIETY  30 tablet 0    meloxicam (MOBIC) 7.5 MG tablet Take 1 tablet (7.5 mg total) by mouth daily. 90 tablet 1    metFORMIN (GLUCOPHAGE) 500 MG tablet 1 tablet in the AM and 1/2 tab with dinner daily 180 tablet 3    metoPROLOL succinate (TOPROL-XL) 50 MG 24 hr tablet TAKE 1 TABLET BY MOUTH AT BEDTIME 90 tablet 3    mupirocin (BACTROBAN) 2 % cream Apply 1 Application topically Two (2) times a day (at 8am and 12:00).      mupirocin (BACTROBAN) 2 % ointment Apply 1 Application topically Two (2) times a day (at 8am and 12:00).      nystatin (MYCOSTATIN) 100,000 unit/gram cream Apply topically two (2) times a day. 30 g 2    RESTASIS 0.05 % ophthalmic emulsion INSTILL 1 DROP INTO EACH EYE TWICE DAILY      spironolactone (ALDACTONE) 25 MG tablet Take 1 tablet (25 mg total) by mouth daily. 90 tablet 3    triamcinolone (KENALOG) 0.1 % cream Apply 1 Application topically Two (2) times a day (at 8am and 12:00).      aspirin (ECOTRIN) 81 MG tablet Take 1 tablet (81 mg total) by mouth daily. 30 tablet 11    blood-glucose meter kit Use as instructed 1 each 0    hydroxyurea (HYDREA) 500 mg capsule Take 1 capsule (500 mg total) by mouth Every Mon, Tue, Wed, Thur, Fri twice daily AND 1 capsule (500 mg total) Every Saturday and Sunday. (Patient not taking: Reported on 08/16/2022) 150 capsule 3    lancing device Misc USE TO CHECK BLOOD SUGAR 2 TIMES A DAY 100 each 2     No facility-administered medications prior to visit.         Objective:       Vital Signs  BP 112/60  - Pulse 76  - Temp 36 ??C (96.8 ??F)  - Wt 78.9 kg (174 lb)  - LMP  (LMP Unknown)  - SpO2 96%  - BMI 31.83 kg/m??      Exam  General: normal appearance  EYES: Anicteric sclerae.  ENT: Oropharynx moist.  RESP: Relaxed respiratory effort. Clear to auscultation without wheezes or crackles.   CV: Regular rate and rhythm. Normal S1 and S2. No murmurs or gallops.  No lower extremity edema. Posterior tibial pulses are 2+ and symmetric.  abd exam: non tender, no masses, no HSM   MSK: No focal muscle tenderness.  SKIN: Appropriately warm and moist.  NEURO: Stable gait and coordination.    Allergies:     Amoxicillin, Cephalexin, Ciprofloxacin, Hydrocodone-acetaminophen, and Sulfamethoxazole    Current Medications:     Current Outpatient Medications   Medication Sig Dispense Refill    albuterol HFA 90 mcg/actuation inhaler Inhale 2 puffs every six (6) hours as needed for wheezing. 18 g 0    amlodipine (NORVASC) 10 MG tablet Take 1 tablet (10 mg total) by mouth daily. 90 tablet 3    atorvastatin (LIPITOR) 10 MG tablet Take 1 tablet (10 mg total) by mouth daily. 90 tablet 3    blood sugar diagnostic (ACCU-CHEK GUIDE TEST STRIPS) Strp by Other route Two (2) times a day (30 minutes before a meal). 200 each 1    calcium-vitamin D 500 mg(1,250mg ) -200 unit per tablet Take 1 tablet by mouth in the morning and 1 tablet in the evening. Take with meals.      cyanocobalamin 2000 MCG tablet Take 1 tablet (2,000 mcg total) by mouth daily.  estradiol (ESTRACE) 0.01 % (0.1 mg/gram) vaginal cream Insert 2 g into the vagina daily. Use externally prn 42.5 g 11    fenofibrate (TRICOR) 145 MG tablet Take 1 tablet (145 mg total) by mouth daily. 90 tablet 3    fluticasone propionate (FLONASE) 50 mcg/actuation nasal spray 1 spray into each nostril daily. 16 g 11    furosemide (LASIX) 20 MG tablet TAKE 1 TABLET BY MOUTH THREE TIMES A WEEK 36 tablet 0    ketoconazole (NIZORAL) 2 % cream APPLY  CREAM TOPICALLY ONCE DAILY FOR SCALP RASH 30 g 0    lancets (ACCU-CHEK SOFTCLIX LANCETS) Misc Use as directed to check glucose twice per day 200 each 5    lisinopril (PRINIVIL,ZESTRIL) 30 MG tablet Take 1 tablet (30 mg total) by mouth daily. 90 tablet 3    LORazepam (ATIVAN) 0.5 MG tablet TAKE 1/2 (ONE-HALF) TABLET BY MOUTH TWICE DAILY AS NEEDED FOR ANXIETY 30 tablet 0    meloxicam (MOBIC) 7.5 MG tablet Take 1 tablet (7.5 mg total) by mouth daily. 90 tablet 1    metFORMIN (GLUCOPHAGE) 500 MG tablet 1 tablet in the AM and 1/2 tab with dinner daily 180 tablet 3    metoPROLOL succinate (TOPROL-XL) 50 MG 24 hr tablet TAKE 1 TABLET BY MOUTH AT BEDTIME 90 tablet 3    mupirocin (BACTROBAN) 2 % cream Apply 1 Application topically Two (2) times a day (at 8am and 12:00).      mupirocin (BACTROBAN) 2 % ointment Apply 1 Application topically Two (2) times a day (at 8am and 12:00).      nystatin (MYCOSTATIN) 100,000 unit/gram cream Apply topically two (2) times a day. 30 g 2    RESTASIS 0.05 % ophthalmic emulsion INSTILL 1 DROP INTO EACH EYE TWICE DAILY      spironolactone (ALDACTONE) 25 MG tablet Take 1 tablet (25 mg total) by mouth daily. 90 tablet 3    triamcinolone (KENALOG) 0.1 % cream Apply 1 Application topically Two (2) times a day (at 8am and 12:00).      aspirin (ECOTRIN) 81 MG tablet Take 1 tablet (81 mg total) by mouth daily. 30 tablet 11    blood-glucose meter kit Use as instructed 1 each 0    hydroxyurea (HYDREA) 500 mg capsule Take 1 capsule (500 mg total) by mouth Every Mon, Tue, Wed, Thur, Fri twice daily AND 1 capsule (500 mg total) Every Saturday and Sunday. (Patient not taking: Reported on 08/16/2022) 150 capsule 3    lancing device Misc USE TO CHECK BLOOD SUGAR 2 TIMES A DAY 100 each 2     No current facility-administered medications for this visit.           Note - This record has been created using [employer]. Chart creation errors have been sought, but may not always have been located. Such creation errors do not reflect on the standard of medical care.    Jenell Milliner, MD

## 2022-08-15 NOTE — Unmapped (Signed)
Pt states she had a corn on her toe that was mashed and is now irritated. She would like to come in to have it looked at . Offered her an appt tomorrow but declined due to other appts sched tomorrow. Pt does not want to go to UC if possible. Please contact and advise.

## 2022-08-16 ENCOUNTER — Ambulatory Visit: Admit: 2022-08-16 | Discharge: 2022-08-16 | Payer: MEDICARE

## 2022-08-16 DIAGNOSIS — M5432 Sciatica, left side: Principal | ICD-10-CM

## 2022-08-16 DIAGNOSIS — M545 Chronic bilateral low back pain without sciatica: Principal | ICD-10-CM

## 2022-08-16 DIAGNOSIS — C884 Extranodal marginal zone B-cell lymphoma of mucosa-associated lymphoid tissue [MALT-lymphoma]: Principal | ICD-10-CM

## 2022-08-16 DIAGNOSIS — M5136 Other intervertebral disc degeneration, lumbar region: Principal | ICD-10-CM

## 2022-08-16 DIAGNOSIS — L84 Corns and callosities: Principal | ICD-10-CM

## 2022-08-16 DIAGNOSIS — M5431 Sciatica, right side: Principal | ICD-10-CM

## 2022-08-16 DIAGNOSIS — G8929 Other chronic pain: Principal | ICD-10-CM

## 2022-08-16 LAB — CBC W/ AUTO DIFF
BASOPHILS ABSOLUTE COUNT: 0 10*9/L (ref 0.0–0.1)
BASOPHILS RELATIVE PERCENT: 0.7 %
EOSINOPHILS ABSOLUTE COUNT: 0 10*9/L (ref 0.0–0.5)
EOSINOPHILS RELATIVE PERCENT: 0.4 %
HEMATOCRIT: 38.1 % (ref 34.0–44.0)
HEMOGLOBIN: 12.9 g/dL (ref 11.3–14.9)
LYMPHOCYTES ABSOLUTE COUNT: 0.8 10*9/L — ABNORMAL LOW (ref 1.1–3.6)
LYMPHOCYTES RELATIVE PERCENT: 31.9 %
MEAN CORPUSCULAR HEMOGLOBIN CONC: 33.9 g/dL (ref 32.0–36.0)
MEAN CORPUSCULAR HEMOGLOBIN: 38.9 pg — ABNORMAL HIGH (ref 25.9–32.4)
MEAN CORPUSCULAR VOLUME: 114.8 fL — ABNORMAL HIGH (ref 77.6–95.7)
MEAN PLATELET VOLUME: 7.6 fL (ref 6.8–10.7)
MONOCYTES ABSOLUTE COUNT: 0.3 10*9/L (ref 0.3–0.8)
MONOCYTES RELATIVE PERCENT: 10.1 %
NEUTROPHILS ABSOLUTE COUNT: 1.4 10*9/L — ABNORMAL LOW (ref 1.8–7.8)
NEUTROPHILS RELATIVE PERCENT: 56.9 %
PLATELET COUNT: 147 10*9/L — ABNORMAL LOW (ref 150–450)
RED BLOOD CELL COUNT: 3.31 10*12/L — ABNORMAL LOW (ref 3.95–5.13)
RED CELL DISTRIBUTION WIDTH: 12.4 % (ref 12.2–15.2)
WBC ADJUSTED: 2.5 10*9/L — ABNORMAL LOW (ref 3.6–11.2)

## 2022-08-16 LAB — COMPREHENSIVE METABOLIC PANEL
ALBUMIN: 4.1 g/dL (ref 3.4–5.0)
ALKALINE PHOSPHATASE: 37 U/L — ABNORMAL LOW (ref 46–116)
ALT (SGPT): 25 U/L (ref 10–49)
ANION GAP: 4 mmol/L — ABNORMAL LOW (ref 5–14)
AST (SGOT): 28 U/L (ref <=34)
BILIRUBIN TOTAL: 0.4 mg/dL (ref 0.3–1.2)
BLOOD UREA NITROGEN: 31 mg/dL — ABNORMAL HIGH (ref 9–23)
BUN / CREAT RATIO: 33
CALCIUM: 10 mg/dL (ref 8.7–10.4)
CHLORIDE: 112 mmol/L — ABNORMAL HIGH (ref 98–107)
CO2: 24 mmol/L (ref 20.0–31.0)
CREATININE: 0.93 mg/dL
EGFR CKD-EPI (2021) FEMALE: 61 mL/min/{1.73_m2} (ref >=60)
GLUCOSE RANDOM: 119 mg/dL (ref 70–179)
POTASSIUM: 4.7 mmol/L (ref 3.4–4.8)
PROTEIN TOTAL: 7.3 g/dL (ref 5.7–8.2)
SODIUM: 140 mmol/L (ref 135–145)

## 2022-08-16 NOTE — Unmapped (Signed)
[email or web address]  [facility]  [address], [place], [place] [address]   [phone]

## 2022-08-16 NOTE — Unmapped (Signed)
1000 Labs drawn peripherally and sent for analysis. Bandage applied after hemostasis.

## 2022-08-16 NOTE — Unmapped (Signed)
IDENTIFICATION: This is a 82 y.o. 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  She was negative for HIV and HBV.  Staging PET-CT has not been ordered.    PROGNOSIS:  A validated prognostic score for follicular lymphoma is the FLIPI score (Solal-Celigny, Blood, 2004), which has been validated in marginal zone lymphoma (Heilgeist, Cancer, 2013). Among 144 patients with MZL, the 5-year PFS for patients who had FLIPI scores of 0 to 2 (low or intermediate risk) was excellent at 92%, whereas it was only 62% for patients who had FLIPI scores of 3 to 5 (poor risk; P = .003). Similarly, the 5-year OS for patients who had FLIPI scores of 0 to 2 was 95%, whereas it was only 62% for patients who had FLIPI scores of 3 to 5 (P = .0009). This patient had the following FLIPI score prognostic factors: age >19, correlating with a low-risk (0-1 points, 10-yr OS 70%) FLIPI score.     A validated prognostic score for MALT lymphoma is the MALT-IPI ([place]ont, Blood, 2017). This score is comprised of 3 factors: age >70, elevated LDH, and stage III/IV. This patient has none of these factors and has low risk disease (23yr EFS 70%; 76yr OS 99%).       TREATMENT:  The standard approach for advanced MZL is surveillance unless there are GELF criteria present. The patient is asymptomatic so we will initiate a surveillance protocol. Standard surveillance consists of a history, physical exam and labwork every 4 months for the first year. Additionally, patients should have a CT CAP every 6 months to monitor for disease progression. After the first year, if the disease is stable, the follow-up intervals can be lengthened.     Her TTE 04/12/21 showed EF 35-40% which is 2/2 CHF. She had a positive peripheral blood flow on 02/24/2020, so she likely has a low level of active lymphoma. Recent CTA was neg for LAD and CT in 2021 was also not c/f lymphoma. She had elevated HGB at 17.8 11/08/21. JAK2 mutation+. Refer to [facility] clinic.      PET 02/2022 showed mild increased bone marrow uptake which is nonspecific, potentially related to polycythemia. Patient has no LN activity. Continue surveillance. She continues to follow closely with Dr. Raeanne Gathers team for PCV follow up. As such, she will RTC in 1 year for lymphoma surveillance visit.    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 Pt seen by Dr. Leotis Pain in clinic and is on hydrea. Monitoring closely with their team.     SUMMARY:  - Diagnosed with orbital MZL in 1999  - Treated with 3-4 months of Cytoxan  - Recurrence of MZL to lower eyelid R eye in 10/2002  - Underwent local radiation to eye. CT scans 11/20/02 revealed no evidence of recurrent tumor  - On leukocytopenia work-up 02/24/20, peripheral blood flow cytometry revealed low-level CD5-negative/CD10-negative b-cell population  representing <1% of peripheral WBC's.   - CT CAP negative in 07/2019  - CTA neg 02/2021  - TTE 04/12/21: EF 35-40%  - Hg 17.8; JAK2+: Dr. Leotis Pain following   -starting hydrea 500mg  daily  - PET (02/14/22): mild increase BM uptake, nonspecific.  -Following closely with Dr. Juanna Cao team for PCV, on hydrea   - RTC in 1 year for lymphoma surveillance.    A total of 30 minutes were spent face-to-face and non-face-to-face in the care of this patient, which includes all pre, intra, and post visit time on the date of service.  ______________________________________________________________________    Ivor Costa. Venancio Poisson  [facility]  Division of Hematology    ______________________________________________________________________      INTERVAL HISTORY:  Ms. Kerrick presents for surveillance visit and is unaccompanied. She is doing well from lymphoma standpoint. She has been following closely with Dr. Zenaida Niece Deventer's team as she titrates her hydrea. She is hoping to stay on hydrea and not change to other medications. She also is seeing the spine center in consultation today for her back pain. Denies fevers, chills, nausea, vomiting, diarrhea, chest pain, shortness of breath, cough, early satiety, weight loss, new LAD, or concerning symptoms.     HISTORY OF PRESENT ILLNESS:  Ms. Graner is a 82 y.o. Caucasian female who we are seeing in consultation at the request of Dr. Theadora Rama for further evaluation of leukopenia.  Patient has a h/o orbital marginal zone lymphoma originally diagnosed in 1999. Treated with primary resection and Cytoxan for 3-4 months. Had local recurrence in 10/2002 involving R eye lower eyelid without ocular involvement, treated with local radiation in [place]. Previously followed by [facility], with last visit in 04/2012. Per available records, WBCs have been predominantly in the 2.6-3.6 range with ANCs 1.1-2.3 range and ALCs 0.4-1.5 range dating back to 2010.  Patient presented to [facility] 02/24/20 for follow-up evaluation of leukocytopenia. In work-up, her peripheral blood flow cytometry showed mild lymphopenia with low-level CD5-negative/CD10-negative monotypic B-cell population, representing <1% (23 cells/uL) of the peripheral white blood cells. Given previous history of marginal zone lymphoma, was referred [facility] lymphoma clinic.     She is here with a one point cane accompanied by her son. Patient developed non-drenching night sweats in early 02/2020 and became febrile (110.7) on 02/21/20. She is doing well today. Her BM is normal. 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 She does have some mild tachycardia. She reports having some fluid problems given her CHF hx- manages with fluid pills. Denies fevers, chills, night sweats, nausea, vomiting, diarrhea, chest pain, shortness of breath, cough, early satiety, weight loss, new LAD, or concerning symptoms.     PAST MEDICAL HISTORY:  Past Medical History:   Diagnosis Date    At risk for falls     left ankle arthritis; cane     Coronary artery disease     Diabetes mellitus (CMS-HCC)     Diffuse large B cell lymphoma (CMS-HCC)     Hypertension     Impaired mobility     left ankle arthritis, cane    Visual impairment     glasses       MEDICATIONS:  Current Outpatient Medications   Medication Sig Dispense Refill    albuterol HFA 90 mcg/actuation inhaler Inhale 2 puffs every six (6) hours as needed for wheezing. 18 g 0    amlodipine (NORVASC) 10 MG tablet Take 1 tablet (10 mg total) by mouth daily.  90 tablet 3    atorvastatin (LIPITOR) 10 MG tablet Take 1 tablet (10 mg total) by mouth daily. 90 tablet 3    blood sugar diagnostic (ACCU-CHEK GUIDE TEST STRIPS) Strp by Other route Two (2) times a day (30 minutes before a meal). 200 each 1    calcium-vitamin D 500 mg(1,250mg ) -200 unit per tablet Take 1 tablet by mouth in the morning and 1 tablet in the evening. Take with meals.      cyanocobalamin 2000 MCG tablet Take 1 tablet (2,000 mcg total) by mouth daily.      estradiol (ESTRACE) 0.01 % (0.1 mg/gram) vaginal cream Insert 2 g into the vagina daily. Use externally prn 42.5 g 11    fenofibrate (TRICOR) 145 MG tablet Take 1 tablet (145 mg total) by mouth daily. 90 tablet 3    fluticasone propionate (FLONASE) 50 mcg/actuation nasal spray 1 spray into each nostril daily. 16 g 11    furosemide (LASIX) 20 MG tablet TAKE 1 TABLET BY MOUTH THREE TIMES A WEEK 36 tablet 0    ketoconazole (NIZORAL) 2 % cream APPLY CREAM TOPICALLY ONCE DAILY FOR SCALP RASH 30 g 0    lancets (ACCU-CHEK SOFTCLIX LANCETS) Misc Use as directed to check glucose twice per day 200 each 5    lisinopril (PRINIVIL,ZESTRIL) 30 MG tablet Take 1 tablet (30 mg total) by mouth daily. 90 tablet 3    LORazepam (ATIVAN) 0.5 MG tablet TAKE 1/2 (ONE-HALF) TABLET BY MOUTH TWICE DAILY AS NEEDED FOR ANXIETY 30 tablet 0    meloxicam (MOBIC) 7.5 MG tablet Take 1 tablet (7.5 mg total) by mouth daily. 90 tablet 1    metFORMIN (GLUCOPHAGE) 500 MG tablet 1 tablet in the AM and 1/2 tab with dinner daily 180 tablet 3    metoPROLOL succinate (TOPROL-XL) 50 MG 24 hr tablet TAKE 1 TABLET BY MOUTH AT BEDTIME 90 tablet 3    mupirocin (BACTROBAN) 2 % cream Apply 1 Application topically Two (2) times a day (at 8am and 12:00).      mupirocin (BACTROBAN) 2 % ointment Apply 1 Application topically Two (2) times a day (at 8am and 12:00).      nystatin (MYCOSTATIN) 100,000 unit/gram cream Apply topically two (2) times a day. 30 g 2    RESTASIS 0.05 % ophthalmic emulsion INSTILL 1 DROP INTO EACH EYE TWICE DAILY      spironolactone (ALDACTONE) 25 MG tablet Take 1 tablet (25 mg total) by mouth daily. 90 tablet 3    triamcinolone (KENALOG) 0.1 % cream Apply 1 Application topically Two (2) times a day (at 8am and 12:00).      aspirin (ECOTRIN) 81 MG tablet Take 1 tablet (81 mg total) by mouth daily. 30 tablet 11    blood-glucose meter kit Use as instructed 1 each 0    hydroxyurea (HYDREA) 500 mg capsule Take 1 capsule (500 mg total) by mouth Every Mon, Tue, Wed, Thur, Fri twice daily AND 1 capsule (500 mg total) Every Saturday and Sunday. (Patient not taking: Reported on 08/16/2022) 150 capsule 3    lancing device Misc USE TO CHECK BLOOD SUGAR 2 TIMES A DAY 100 each 2     No current facility-administered medications for this visit.       ALLERGIES:  Allergies   Allergen Reactions    Amoxicillin      Other Reaction(s): Unknown    Cephalexin Palpitations    Ciprofloxacin Palpitations Legacy System: [place]    Onset Date: <blank>  Substance Legacy/Cerner: Cipro / Cipro (Legacy value)    Category: Drug    Severity Legacy/Cerner: <blank> / Unknown    Reaction(s): makes head feel funny  nausea    Comments: <blank>    Hydrocodone-Acetaminophen Nausea And Vomiting and Other (See Comments)    Sulfamethoxazole Palpitations       SOCIAL HISTORY:  Social History     Socioeconomic History    Marital status: Widowed   Tobacco Use    Smoking status: Never    Smokeless tobacco: Never   Vaping Use    Vaping status: Never Used   Substance and Sexual Activity    Alcohol use: Not Currently    Drug use: Never    Sexual activity: Not Currently     Social Determinants of Health     Financial Resource Strain: Low Risk  (06/14/2022)    Overall Financial Resource Strain (CARDIA)     Difficulty of Paying Living Expenses: Not hard at all   Food Insecurity: No Food Insecurity (06/14/2022)    Hunger Vital Sign     Worried About Running Out of Food in the Last Year: Never true     Ran Out of Food in the Last Year: Never true   Transportation Needs: No Transportation Needs (02/14/2021)    PRAPARE - Transportation     Lack of Transportation (Medical): No     Lack of Transportation (Non-Medical): No   Stress: No Stress Concern Present (10/16/2021)    [employer] of Occupational Health - Occupational Stress Questionnaire     Feeling of Stress : Not at all       FAMILY HISTORY:  Family History   Problem Relation Age of Onset    Cancer Other     Diabetes Mother     Asthma Mother     Hypertension Father     Stroke Father        REVIEW OF SYSTEMS:  See HPI. A 10 system ROS is otherwise negative.    VITAL SIGNS:   Vitals:    08/16/22 0930   Pulse: 65   Resp: 20   Temp: 35.7 ??C (96.2 ??F)   TempSrc: Temporal   SpO2: 96%   Weight: 79.4 kg (175 lb 0.7 oz)       EXAM:  ECOG: 1  CONST: NAD, Awake, Alert  HEENT: Oropharynx clear.   LYMPH: No cervical, supraclavicular, axillary, or inguinal LAD  RESP: Clear to auscultation bilaterally.  CV: Regular rate and rhythm. No rubs, gallops or murmurs.   GI: Soft, nontender, nondistended. No hepatosplenomegaly.   MSK: Trace edema to b/l LE. L ankle brace.   NEURO: No focal deficits    LABORATORY:  Office Visit on 08/16/2022   Component Date Value Ref Range Status    WBC 08/16/2022 2.5 (L)  3.6 - 11.2 10*9/L Final    RBC 08/16/2022 3.31 (L)  3.95 - 5.13 10*12/L Final    HGB 08/16/2022 12.9  11.3 - 14.9 g/dL Final    HCT 16/10/9602 38.1  34.0 - 44.0 % Final    MCV 08/16/2022 114.8 (H)  77.6 - 95.7 fL Final    MCH 08/16/2022 38.9 (H)  25.9 - 32.4 pg Final    MCHC 08/16/2022 33.9  32.0 - 36.0 g/dL Final    RDW 54/09/8117 12.4  12.2 - 15.2 % Final    MPV 08/16/2022 7.6  6.8 - 10.7 fL Final    Platelet 08/16/2022 147 (L)  150 - 450 10*9/L Final  Neutrophils % 08/16/2022 56.9  % Final    Lymphocytes % 08/16/2022 31.9  % Final    Monocytes % 08/16/2022 10.1  % Final    Eosinophils % 08/16/2022 0.4  % Final    Basophils % 08/16/2022 0.7  % Final    Absolute Neutrophils 08/16/2022 1.4 (L)  1.8 - 7.8 10*9/L Final    Absolute Lymphocytes 08/16/2022 0.8 (L)  1.1 - 3.6 10*9/L Final    Absolute Monocytes 08/16/2022 0.3  0.3 - 0.8 10*9/L Final    Absolute Eosinophils 08/16/2022 0.0  0.0 - 0.5 10*9/L Final    Absolute Basophils 08/16/2022 0.0  0.0 - 0.1 10*9/L Final    Macrocytosis 08/16/2022 Moderate (A)  Not Present Final       IMAGING:  PET 02/14/22  1. Mildly diffusely increased bone marrow uptake, which is a highly nonspecific finding. While this could represent some degree of lymphoma involvement, could also be seen with other conditions such as hypoxia.    2. Otherwise, no evidence of FDG avid lymphoma    CTA chest w/o contrast 07/28/19:   -No pulmonary embolism.  -Multifocal groundglass opacities with mild interlobular septal thickening and trace pleural effusions likely pulmonary edema.     MRI abdomen 07/21/19:   -Sequela of cholecystectomy. No evidence of retained biliary ductal stone or stricture.  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    PATHOLOGY:  Peripheral blood flow cytometry 02/24/20:   Diagnosis   Peripheral blood, smear review and flow cytometry  -  Lymphopenia with low-level CD5-negative/CD10-negative monotypic B-cell population identified, representing <1% (23 cells/uL) of the peripheral white blood cells by flow cytometric analysis (See Comment)     This electronic signature is attestation that the pathologist personally reviewed the submitted material(s) and the final diagnosis reflects that evaluation.   Electronically signed by Sondra Come, MD on 02/26/2020 at 1058   Diagnosis Comment    While the significance of the numerically small CD5-negative/CD10-negative monotypic B-cell population identified in the peripheral blood is uncertain, this likely represents low-level involvement by the patient's reported previously diagnosed marginal zone lymphoma. Clinical and radiographic correlation is necessary for further characterization.

## 2022-08-16 NOTE — Unmapped (Signed)
[facility] Physical Medicine and Rehabilitation   New Patient Note    Patient Name:Cindy Gonzalez  MRN: [id number]  DOB: [birth year]  Age: 82 y.o.   Date: 08/16/2022      ASSESSMENT:     Pt is a 82 y.o. 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  If no better after 6 weeks would consider obtaining MRI of the lumbar spine and referral to pain medicine for possible medial branch blocks and RFA    - Encouraged the patient to stay active.       - No follow-ups on file.    - Discussed at length today about her problem.    SUBJECTIVE:     Chief Complaint:    Back Pain (Patient reports arthitis pain, pain increased over last 28 years has done therapies and medication management patient reports pain worsening. ED follow up after increase of pain. )      History of Present Illness:   Cindy Gonzalez is a 82 y.o. year old female seen as a new evaluation regarding low back pain..  Cindy Gonzalez states that she has had low back pain for 20+ years.  She describes back pain is worse with activity.  She denies any leg symptoms and denies any bowel or bladder issues.  She states that she has had physical therapy in the past and some injection based treatments give her some relief.  She is looking for something similar now    She has had imaging that is viewable in the [facility] system today..      We reviewed extensive past medical records today.       Current Outpatient Medications   Medication Sig Dispense Refill    albuterol HFA 90 mcg/actuation inhaler Inhale 2 puffs every six (6) hours as needed for wheezing. 18 g 0    amlodipine (NORVASC) 10 MG tablet Take 1 tablet (10 mg total) by mouth daily. 90 tablet 3    aspirin (ECOTRIN) 81 MG tablet Take 1 tablet (81 mg total) by mouth daily. 30 tablet 11    atorvastatin (LIPITOR) 10 MG tablet Take 1 tablet (10 mg total) by mouth daily. 90 tablet 3    blood sugar diagnostic (ACCU-CHEK GUIDE TEST STRIPS) Strp by Other route Two (2) times a day (30 minutes before a meal). 200 each 1    blood-glucose meter kit Use as instructed 1 each 0    calcium-vitamin D 500 mg(1,250mg ) -200 unit per tablet Take 1 tablet by mouth in the morning and 1 tablet in the evening. Take with meals.      cyanocobalamin 2000 MCG tablet Take 1 tablet (2,000 mcg total) by mouth daily.      estradiol (ESTRACE) 0.01 % (0.1 mg/gram) vaginal cream Insert 2 g into the vagina daily. Use externally prn 42.5 g 11    fenofibrate (TRICOR) 145 MG tablet Take 1 tablet (145 mg total) by mouth daily. 90 tablet 3    fluticasone propionate (FLONASE) 50 mcg/actuation nasal spray 1 spray into each nostril daily. 16 g 11    furosemide (LASIX) 20 MG tablet TAKE 1 TABLET BY MOUTH THREE TIMES A WEEK 36 tablet 0    hydroxyurea (HYDREA) 500 mg capsule Take 1 capsule (  500 mg total) by mouth Every Mon, Tue, Wed, Thur, Fri twice daily AND 1 capsule (500 mg total) Every Saturday and Sunday. (Patient not taking: Reported on 08/16/2022) 150 capsule 3    ketoconazole (NIZORAL) 2 % cream APPLY  CREAM TOPICALLY ONCE DAILY FOR SCALP RASH 30 g 0    lancets (ACCU-CHEK SOFTCLIX LANCETS) Misc Use as directed to check glucose twice per day 200 each 5    lancing device Misc USE TO CHECK BLOOD SUGAR 2 TIMES A DAY 100 each 2    lisinopril (PRINIVIL,ZESTRIL) 30 MG tablet Take 1 tablet (30 mg total) by mouth daily. 90 tablet 3    LORazepam (ATIVAN) 0.5 MG tablet TAKE 1/2 (ONE-HALF) TABLET BY MOUTH TWICE DAILY AS NEEDED FOR ANXIETY 30 tablet 0    meloxicam (MOBIC) 7.5 MG tablet Take 1 tablet (7.5 mg total) by mouth daily. 90 tablet 1    metFORMIN (GLUCOPHAGE) 500 MG tablet 1 tablet in the AM and 1/2 tab with dinner daily 180 tablet 3    metoPROLOL succinate (TOPROL-XL) 50 MG 24 hr tablet TAKE 1 TABLET BY MOUTH AT BEDTIME 90 tablet 3    mupirocin (BACTROBAN) 2 % cream Apply 1 Application topically Two (2) times a day (at 8am and 12:00).      mupirocin (BACTROBAN) 2 % ointment Apply 1 Application topically Two (2) times a day (at 8am and 12:00).      nystatin (MYCOSTATIN) 100,000 unit/gram cream Apply topically two (2) times a day. 30 g 2    RESTASIS 0.05 % ophthalmic emulsion INSTILL 1 DROP INTO EACH EYE TWICE DAILY      spironolactone (ALDACTONE) 25 MG tablet Take 1 tablet (25 mg total) by mouth daily. 90 tablet 3    triamcinolone (KENALOG) 0.1 % cream Apply 1 Application topically Two (2) times a day (at 8am and 12:00).       No current facility-administered medications for this visit.       Allergies:   Amoxicillin, Cephalexin, Ciprofloxacin, Hydrocodone-acetaminophen, and Sulfamethoxazole    Past Medical / Surgical History:     Past Medical History:   Diagnosis Date    At risk for falls     left ankle arthritis; cane     Coronary artery disease     Diabetes mellitus (CMS-HCC)     Diffuse large B cell lymphoma (CMS-HCC)     Hypertension     Impaired mobility     left ankle arthritis, cane    Visual impairment     glasses       Past Surgical History:   Procedure Laterality Date    CHOLECYSTECTOMY      HYSTERECTOMY      OOPHORECTOMY         Social History     Socioeconomic History    Marital status: Widowed   Tobacco Use    Smoking status: Never    Smokeless tobacco: Never   Vaping Use    Vaping status: Never Used   Substance and Sexual Activity    Alcohol use: Not Currently    Drug use: Never    Sexual activity: Not Currently     Social Determinants of Health     Financial Resource Strain: Low Risk  (06/14/2022)    Overall Financial Resource Strain (CARDIA)     Difficulty of Paying Living Expenses: Not hard at all   Food Insecurity: No Food Insecurity (06/14/2022)    Hunger Vital Sign     Worried About Running  Out of Food in the Last Year: Never true     Ran Out of Food in the Last Year: Never true   Transportation Needs: No Transportation Needs (02/14/2021)    PRAPARE - Transportation     Lack of Transportation (Medical): No     Lack of Transportation (Non-Medical): No   Stress: No Stress Concern Present (10/16/2021)    [employer] of Occupational Health - Occupational Stress Questionnaire     Feeling of Stress : Not at all     She is currently retired.     Family History   Problem Relation Age of Onset    Cancer Other     Diabetes Mother     Asthma Mother     Hypertension Father     Stroke Father                     Review of Systems:   Review of Systems was completed through a 10 organ system review and is listed in the chart.  Pertinent positive and negatives noted above.    .  OBJECTIVE:     Vitals:     Temp 36.1 ??C (97 ??F) (Skin)  - Ht 157.5 cm (5' 2)  - Wt 79.1 kg (174 lb 6.4 oz)  - LMP  (LMP Unknown)  - BMI 31.90 kg/m??        08/16/22 1056   PainSc: 8          Physical Exam:   GENERAL: No acute distress, pleasant female  PSYCH: Appropriate affect, cooperative  HEENT: Atraumatic, normocephalic   CARDIO: Extremities warm and well-perfused  RESP: Non-dyspneic on room air  SKIN: No focal lesions or ecchymosis appreciated on lumbar spine  MSK: Tenderness palpation of the lumbar spine.  Pain with lumbar extension and rotation.  Mild stiffness with lumbar flexion.  NEURO: Antalgic gait       Diagnostic Studies:   Imaging results noted above in HPI         Redge Gainer, DO  08/16/2022 11:09 AM

## 2022-08-21 DIAGNOSIS — I10 Essential (primary) hypertension: Principal | ICD-10-CM

## 2022-08-21 MED ORDER — ACCU-CHEK GUIDE TEST STRIPS
Freq: Two times a day (BID) | 1 refills | 0 days | Status: CP
Start: 2022-08-21 — End: 2023-08-21

## 2022-08-22 MED ORDER — FUROSEMIDE 20 MG TABLET
ORAL_TABLET | ORAL | 1 refills | 84 days | Status: CP
Start: 2022-08-22 — End: 2023-08-22

## 2022-08-23 DIAGNOSIS — I10 Essential (primary) hypertension: Principal | ICD-10-CM

## 2022-08-23 MED ORDER — SPIRONOLACTONE 25 MG TABLET
ORAL_TABLET | Freq: Every day | ORAL | 1 refills | 90 days | Status: CP
Start: 2022-08-23 — End: 2023-08-23

## 2022-09-06 DIAGNOSIS — G250 Essential tremor: Principal | ICD-10-CM

## 2022-09-06 DIAGNOSIS — G4700 Insomnia, unspecified: Principal | ICD-10-CM

## 2022-09-06 MED ORDER — LORAZEPAM 0.5 MG TABLET
0 refills | 0 days | Status: CP
Start: 2022-09-06 — End: ?

## 2022-09-18 ENCOUNTER — Ambulatory Visit
Admit: 2022-09-18 | Discharge: 2022-09-19 | Payer: MEDICARE | Attending: Hematology & Oncology | Primary: Hematology & Oncology

## 2022-09-18 ENCOUNTER — Other Ambulatory Visit: Admit: 2022-09-18 | Discharge: 2022-09-19 | Payer: MEDICARE

## 2022-09-18 DIAGNOSIS — D751 Secondary polycythemia: Principal | ICD-10-CM

## 2022-09-18 LAB — CBC W/ AUTO DIFF
BASOPHILS ABSOLUTE COUNT: 0 10*9/L (ref 0.0–0.1)
BASOPHILS RELATIVE PERCENT: 0.7 %
EOSINOPHILS ABSOLUTE COUNT: 0 10*9/L (ref 0.0–0.5)
EOSINOPHILS RELATIVE PERCENT: 0.4 %
HEMATOCRIT: 40.1 % (ref 34.0–44.0)
HEMOGLOBIN: 13.5 g/dL (ref 11.3–14.9)
LYMPHOCYTES ABSOLUTE COUNT: 0.9 10*9/L — ABNORMAL LOW (ref 1.1–3.6)
LYMPHOCYTES RELATIVE PERCENT: 28.7 %
MEAN CORPUSCULAR HEMOGLOBIN CONC: 33.6 g/dL (ref 32.0–36.0)
MEAN CORPUSCULAR HEMOGLOBIN: 37.5 pg — ABNORMAL HIGH (ref 25.9–32.4)
MEAN CORPUSCULAR VOLUME: 111.7 fL — ABNORMAL HIGH (ref 77.6–95.7)
MEAN PLATELET VOLUME: 7.8 fL (ref 6.8–10.7)
MONOCYTES ABSOLUTE COUNT: 0.3 10*9/L (ref 0.3–0.8)
MONOCYTES RELATIVE PERCENT: 9.6 %
NEUTROPHILS ABSOLUTE COUNT: 1.8 10*9/L (ref 1.8–7.8)
NEUTROPHILS RELATIVE PERCENT: 60.6 %
PLATELET COUNT: 160 10*9/L (ref 150–450)
RED BLOOD CELL COUNT: 3.59 10*12/L — ABNORMAL LOW (ref 3.95–5.13)
RED CELL DISTRIBUTION WIDTH: 12.9 % (ref 12.2–15.2)
WBC ADJUSTED: 3 10*9/L — ABNORMAL LOW (ref 3.6–11.2)

## 2022-09-18 LAB — COMPREHENSIVE METABOLIC PANEL
ALBUMIN: 4.1 g/dL (ref 3.4–5.0)
ALKALINE PHOSPHATASE: 47 U/L (ref 46–116)
ALT (SGPT): 28 U/L (ref 10–49)
ANION GAP: 8 mmol/L (ref 5–14)
AST (SGOT): 28 U/L (ref <=34)
BILIRUBIN TOTAL: 0.5 mg/dL (ref 0.3–1.2)
BLOOD UREA NITROGEN: 30 mg/dL — ABNORMAL HIGH (ref 9–23)
BUN / CREAT RATIO: 32
CALCIUM: 10.4 mg/dL (ref 8.7–10.4)
CHLORIDE: 109 mmol/L — ABNORMAL HIGH (ref 98–107)
CO2: 25 mmol/L (ref 20.0–31.0)
CREATININE: 0.93 mg/dL
EGFR CKD-EPI (2021) FEMALE: 61 mL/min/{1.73_m2} (ref >=60)
GLUCOSE RANDOM: 152 mg/dL (ref 70–179)
POTASSIUM: 4.6 mmol/L (ref 3.4–4.8)
PROTEIN TOTAL: 7.3 g/dL (ref 5.7–8.2)
SODIUM: 142 mmol/L (ref 135–145)

## 2022-09-18 LAB — LACTATE DEHYDROGENASE: LACTATE DEHYDROGENASE: 205 U/L (ref 120–246)

## 2022-09-18 NOTE — Unmapped (Addendum)
PLAN:  1) Return in three months.   2) Continue on Hydrea.     The dose is good.  You need to take a total of 11 pills a week.   - I would take 2 pills Mon - Thur  - I would take 1 pill Fri - Sun    I would expect the dose of Hydrea to change from time change.      All lab results last 24 hours:    Recent Results (from the past 24 hour(s))   Comprehensive Metabolic Panel    Collection Time: 09/18/22  2:38 PM   Result Value Ref Range    Sodium 142 135 - 145 mmol/L    Potassium 4.6 3.4 - 4.8 mmol/L    Chloride 109 (H) 98 - 107 mmol/L    CO2 25.0 20.0 - 31.0 mmol/L    Anion Gap 8 5 - 14 mmol/L    BUN 30 (H) 9 - 23 mg/dL    Creatinine 0.62 3.76 - 1.02 mg/dL    BUN/Creatinine Ratio 32     eGFR CKD-EPI (2021) Female 61 >=60 mL/min/1.56m2    Glucose 152 70 - 179 mg/dL    Calcium 28.3 8.7 - 15.1 mg/dL    Albumin 4.1 3.4 - 5.0 g/dL    Total Protein 7.3 5.7 - 8.2 g/dL    Total Bilirubin 0.5 0.3 - 1.2 mg/dL    AST 28 <=76 U/L    ALT 28 10 - 49 U/L    Alkaline Phosphatase 47 46 - 116 U/L   Lactate dehydrogenase    Collection Time: 09/18/22  2:38 PM   Result Value Ref Range    LDH 205 120 - 246 U/L This is a marker for cell death. It increase if the blood disease progresses    CBC w/ Differential    Collection Time: 09/18/22  2:38 PM   Result Value Ref Range    WBC 3.0 (L) 3.6 - 11.2 10*9/L    RBC 3.59 (L) 3.95 - 5.13 10*12/L    HGB 13.5 11.3 - 14.9 g/dL    HCT 16.0 73.7 - 10.6 % We like this to be < 42.5    MCV 111.7 (H) 77.6 - 95.7 fL This  is the size of the red cells. It is increased by Hydrea.     MCH 37.5 (H) 25.9 - 32.4 pg    MCHC 33.6 32.0 - 36.0 g/dL    RDW 26.9 48.5 - 46.2 %    MPV 7.8 6.8 - 10.7 fL    Platelet 160 150 - 450 10*9/L This is normal    Neutrophils % 60.6 %    Lymphocytes % 28.7 %    Monocytes % 9.6 %    Eosinophils % 0.4 %    Basophils % 0.7 %    Absolute Neutrophils 1.8 1.8 - 7.8 10*9/L This is the most important number from an immune standpoint. These cells protect from gram negative, staph, strept, and fungal infections.  This a problem if it is < 0.5    Absolute Lymphocytes 0.9 (L) 1.1 - 3.6 10*9/L    Absolute Monocytes 0.3 0.3 - 0.8 10*9/L    Absolute Eosinophils 0.0 0.0 - 0.5 10*9/L    Absolute Basophils 0.0 0.0 - 0.1 10*9/L    Macrocytosis Moderate (A) Not Present

## 2022-09-18 NOTE — Unmapped (Signed)
ID:  Cindy Gonzalez is an 82 y.o. with PCV    DZ CHAR:   5.6/17.8/55.8/243; K 5.3; LDH: 248  Epo < 1  Smear: No immature forms; nl RBC morphology; smudge cells; reactive lymph   Ferritin: 27.5  LDH: 399  No h/o thrombosis.     Variants of Known/Likely Clinical Significance:   Gene Coding Predicted Protein Variant allele fraction   IDH2 c.419G>A p.(Arg140Gln) 26.7%   JAK2 c.1849G>T p.(Val617Phe) 69.8%   TET2 c.4045-2A>G p.(?) 13.4%      ASSESSMENT:   Cindy Cattaneo is an 82 y.o. w/ a h/o erythrocytosis.  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  A bone marrow biopsy is not needed to make the diagnosis though it may offer additional prognostic information.  She also has an IDH2 and TET2 mutation, conveying worse prognosis.    Overall Cindy. Pais is tolerating hydrea well.  Her last dose reduction has put her into the hematologic sweet spot.  We can make her treatment a bit easier by going with two tablets a day on Mon - Thur and one tablet a day on Fri to Sun.      PLAN:  1) Return to clinic in three months.   2) Continue on Hydrea 500 mg.   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    04/2020: 3.2/13.7/41.1/225; ANC: 1.9; ALC: 1.0  02/2021: CTA: Negative; B12: 1,251  04/2021: TTE: EF 35-40%; 5.5/14.6/44.3/268; LDH: 234  10/14/21: 8.0/17.7/55.4/265; AMC: 0.1; ALC: 0.5; K: 5.0  10/19/21: JAK2: 64.9; Epo < 1  11/08/21: 5.6/17.8/55.8/243; K 5.3; LDH: 248  02/06/22: Seen by [facility]  7.8/19.5/59.7/262  Smear: No immature forms; nl RBC morphology; smudge cells; reactive lymph   Myeloid mutation panel: IDH2, JAK2, TET2  Ferritin: 27.5  LDH: 399  02/14/22: Started Hydroxyurea 3.5 g/wk  6.5/18.8/58.4/236; MCV: 84.1  02/23/22: Therapeutic phlebotomy   5.9/19.5/60.6/261  Ferritin: 25.3  02/26/22: 6.3/18.4/57.2/262; Hydrea 5.5 g/wk  03/09/22: Phlebotomy;   4.3/17.8/.55.4/153;  Ferritin: 18.3  03/17/22: 5.0/17.7/54.9/114; MCV: 87.5  04/10/22:Phlebotomy; 3.0/17.2/52.2/145; Hydrea 7 g/wk;   MCV: 90.0  ferritin: 265.5  06/12/22: 2.7/11.8/34.2/115; ANC 1.5: Hydrea reduced 6 g/wk   07/24/22: 2.4/12.5/37.0/141, ANC 1.3; Hydrea reduced to 5.5g/wk  09/18/22: 3.0/13.5/160; ANC: 1.8    INTERVAL HX:  Cindy Deturk comes for FU of her PCV. We discussed the following:  Adherence: She reports missing only a a few doses.  She notes some confusion when switching from 1 to 2 pills   Tolerance: She denies any mouth sores, rashes, or GI symptoms.    MPN Sx: No Aq pruritus, concentration problems, deep bone pain, early satiety   PS: She continues to be active; she shops, does her chores, and participates in bible study.   ID: No F/C/UR sx   Heme: No bleeding sx   B sx: Minor NS, no change in wt, no F     PHYSICAL EXAM:  VS: As recorded in Epic  GENERAL: She appears well; able to get to the table with minimal assistance  HEENT:  OP is clear;  LYMPH NODES: No LAN  LUNGS: CTA  COR: RRR w/o m/r/g  ABD: NTND; no HSM  EXT: No edema    MPN 10 Score  (App: [email or web address])   [email or web address]  (Useful for monitoring patient symptoms.    Symptom Range Pre-Tx   Fatigue in the past 24 hours (Absent) 0 1 2 3 4 5 6 7 8 9  10 (Worst Imaginable) 0   Filling up quickly when you eat (Early satiety)  (Absent) 0 1 2 3 4 5 6 7 8 9  10 (Worst Imaginable) 0   Abdominal discomfort  (Absent) 0 1 2 3 4 5 6 7 8 9  10 (Worst Imaginable) 0   Inactivity (Absent) 0 1 2 3 4 5 6 7 8 9  10 (Worst Imaginable) Uses walking stick; slowed done arthritis 1   Problems with concentration - (Absent) 0 1 2 3 4 5 6 7 8 9  10 (Worst Imaginable) 0   Numbness/ Tingling (in my hands and feet) (Absent) 0 1 2 3 4 5 6 7 8 9  10 (Worst Imaginable) occ in feet 1 - 2   Night sweats (Absent) 0 1 2 3 4 5 6 7 8 9  10 (Worst Imaginable) She notes a little sweatiness with her cholesterol medicine 1   Itching (pruritus) (Absent) 0 1 2 3 4 5 6 7 8 9  10 (Worst Imaginable) 1   Bone pain (diffuse not joint pain or arthritis) (Absent) 0 1 2 3 4 5 6 7 8 9  10 (Worst Imaginable) It is difficult to separate bone from arthritis pain    Fever (>100 F) (Absent) 0 1 2 3 4 5 6 7 8 9  10 (Daily) 0   Unintentional weight loss last 6 months (Absent) 0 1 2 3 4 5 6 7 8 9  10 (Worst Imaginable) With DM meds 16 lbs      5     PMHx of pancreatitis, hepatitis, T2DM, atelectasis, acute respiratory failure with hypoxemia, nonischemic cardiomyopathy, HTN, aortic stenosis, heart failure, CAD, essential tremor, osteoarthritis, vertigo, iron deficiency anemia, insomnia, dyslipidemia, Diffuse large B cell lymphoma.      Primary prophylaxis of thrombosis  All patients: Aspirin unless    VWF activity <30%,   Platelet >?1 million,   CALR-mutated low-risk ET    PV with Hct >?45%: Add phlebotomy/cytoreduction to target Hct <?45%  Age >?60 years and/or prior history of thrombosis: Add cytoreduction  Secondary prophylaxis after thrombotic event  All patients: Cytoreduction  Typical VTE: Consider indefinite VKA for most patients; aspirin if not on VKA  Atypical VTE: Indefinite VKA  Arterial thrombosis: Aspirin    PCV Risk  Age > 60:    1   No: 0   Yes: 1    History of thrombosis:  0   No: 0   Yes: 1    Low Risk: 0 points  High Risk 1 -2 points    Low Risk patients treat with aspirin every day;   High Risk patients treat with aspirin BID*  * Consider BID aspirin for patients with microvascular complications, additional CV risk factors, or leukocytosis    PV:   Major criteria  Hgb/Hct  Men: >?16.5?g/dL or 08% or inc red blood cell mass  Women: > 16 g/dL or 65% or inc red blood cell mass  Inc in RBC Mass > 25%  Presence of JAK2 or JAK2 exon 12 mutation   BM biopsy showing hypercellularity for age with trilineage growth (panmyelosis) including prominent erythroid,  granulocytic and megakaryocytic proliferation with pleomorphic, mature megakaryocytes (differences in size, without atypia)     Minor criteria  Subnormal serum erythropoietin level     Diagnosis: All 3 major or first 2 major and 1 minor    Post-PV MF  Required criteria  Previous dx of PCV  BM Fibrosis of grade 2/3    Additional  Anemia or loss of need for cytoreduction  Leukoerythroblastosis  Splenomegaly > 5 cm from baseline or new splenomegaly  2 or more constitutional sx: > 10% wt loss in 6 mths, NS, fever > 37.5

## 2022-09-18 NOTE — Unmapped (Signed)
Patient educated on various topics, clinical references attached to patient's MyChart and AVS. Time spent 3 minutes.

## 2022-09-21 NOTE — Unmapped (Signed)
[facility] Refill Coordination Note    Specialty Lite Medication(s) to be Shipped:   hydroxyurea    Other medication(s) to be shipped: No additional medications requested for fill at this time     Cindy Gonzalez, DOB: December 14, 1940  Phone: [phone] (home)       All above HIPAA information was verified with patient.     Was a Nurse, learning disability used for this call? No    Changes to medications: Gayathri reports no changes at this time.  Changes to insurance: No      REFERRAL TO PHARMACIST     Referral to the pharmacist: Not needed      [facility]     Shipping address confirmed in Epic.     Delivery Scheduled: Yes, Expected medication delivery date: 09/29/22.     Medication will be delivered via Next Day Courier to the prescription address in Epic WAM.    Harlie Buening Vangie Bicker, PharmD   [facility] Specialty Pharmacist

## 2022-09-26 ENCOUNTER — Encounter: Payer: Self-pay | Admitting: Physical Therapy

## 2022-09-26 ENCOUNTER — Ambulatory Visit: Payer: Medicare HMO | Attending: Physical Medicine & Rehabilitation | Admitting: Physical Therapy

## 2022-09-26 DIAGNOSIS — M25552 Pain in left hip: Secondary | ICD-10-CM | POA: Insufficient documentation

## 2022-09-26 DIAGNOSIS — M25551 Pain in right hip: Secondary | ICD-10-CM | POA: Diagnosis present

## 2022-09-26 DIAGNOSIS — M545 Low back pain, unspecified: Secondary | ICD-10-CM | POA: Diagnosis present

## 2022-09-26 DIAGNOSIS — G8929 Other chronic pain: Secondary | ICD-10-CM | POA: Insufficient documentation

## 2022-09-26 DIAGNOSIS — M6281 Muscle weakness (generalized): Secondary | ICD-10-CM | POA: Diagnosis present

## 2022-09-28 MED FILL — HYDROXYUREA 500 MG CAPSULE: ORAL | 88 days supply | Qty: 150 | Fill #1

## 2022-10-01 ENCOUNTER — Encounter: Payer: Self-pay | Admitting: Physical Therapy

## 2022-10-01 ENCOUNTER — Ambulatory Visit: Payer: Medicare HMO | Admitting: Physical Therapy

## 2022-10-01 DIAGNOSIS — M25551 Pain in right hip: Secondary | ICD-10-CM

## 2022-10-01 DIAGNOSIS — M545 Low back pain, unspecified: Secondary | ICD-10-CM

## 2022-10-01 DIAGNOSIS — M6281 Muscle weakness (generalized): Secondary | ICD-10-CM

## 2022-10-01 NOTE — Therapy (Unsigned)
OUTPATIENT PHYSICAL THERAPY TREATMENT   Patient Name: Jacqueline Arroyo MRN: [id number] DOB:1940/12/21, 82 y.o., female Today's Date: 10/01/2022  END OF SESSION:  PT End of Session - 10/01/22 0850     Visit Number 2    Number of Visits 13    Date for PT Re-Evaluation 11/08/22    Authorization Type [employer]    PT Start Time 0905    PT Stop Time 0946    PT Time Calculation (min) 41 min    Equipment Utilized During Treatment --   pt utilizes personal SPC to negotiate office   Activity Tolerance Patient limited by pain;Patient tolerated treatment well    Behavior During Therapy [facility] for tasks assessed/performed              Past Medical History:  Diagnosis Date   Diabetes mellitus without complication (HCC)    Hypertension    Past Surgical History:  Procedure Laterality Date   ABDOMINAL HYSTERECTOMY     BACK SURGERY     CHOLECYSTECTOMY     FOOT SURGERY     There are no problems to display for this patient.   PCP: [facility]  REFERRING PROVIDER: Carnella Guadalajara, DO  REFERRING DIAG:  M54.50 (ICD-10-CM) - Low back pain, unspecified  G89.29 (ICD-10-CM) - Other chronic pain    RATIONALE FOR EVALUATION AND TREATMENT: Rehabilitation  THERAPY DIAG: Chronic bilateral low back pain without sciatica  Bilateral hip pain  Muscle weakness (generalized)  ONSET DATE: 07/15/22 (most recent flare-up resulting in visit to [facility] ED)  FOLLOW-UP APPT SCHEDULED WITH REFERRING PROVIDER: None currently on schedule  PERTINENT HISTORY: Patient is an 82 year old female referred for chronic low back pain; 20+ years of pain with prior episodes of PT and lumbar spine injections. Pt reports being shaky in the morning and having intermittent tremor. Hx of kyphoplasty in midback per patient for compression fracture. Patient reports pain goes from one hip to the other. Pt reports pain down into R thigh. Back symptoms described as soreness at rest. Pt had to use Tramadol for significant  breakthrough pain. No recent sciatica. Some sensation of "swelling" in R lateral thigh. 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   PAIN:    Pain Intensity: Present: 4-5/10, Best: 4/10, Worst: 10/10 Pain location: Across waist and bilateral gluteal region Pain Quality:  sore/tender   Radiating: Yes , to bilat glutes, bilateral flank region  Numbness/Tingling: Yes; seldom numbness in L shin  Focal Weakness: No Aggravating factors: Lifting (sometimes even bottle of water), lying on R side/sometimes on L Relieving factors: Meloxicam/Bengay/Tylenol; Tramadol prn; lying on back and stretching out in bed; lying down; sitting  24-hour pain behavior: sometimes worse in AM, sometimes bad all day long   History of prior back injury, pain, surgery, or therapy: Yes; prior PT, injections, chronic back pain   Imaging: Yes ;  EXAM: XR LUMBAR SPINE AP LATERAL AND OBLIQUES DATE: 06/25/2022 1:14 PM ACCESSION: [id number] UN DICTATED: 06/25/2022 1:16 PM INTERPRETATION LOCATION: [facility]  CLINICAL INDICATION: 82 years old Female with lower back pain    COMPARISON: CT abdomen pelvis 03/15/2022.  TECHNIQUE: AP and oblique views of the lumbar spine. A lateral view is not provided  FINDINGS: No acute fractures. Sequela of T11 and T12 vertebral augmentation.  Dextrocurvature of the lumbar spine. No anterior or retrolisthesis on oblique views. Disc narrowing, endplate sclerosis, and osteophytosis greatest at L4-L5 and L5-S1. Facet joint arthropathy greatest at L4-L5 and L5-S1.  Vascular calcifications.  Red flags: Negative for bowel/bladder changes, saddle paresthesia, personal history of cancer, h/o spinal tumors, h/o compression fx, h/o abdominal aneurysm, abdominal pain, chills/fever, night sweats, nausea, vomiting, unrelenting pain, first onset of insidious LBP <20 y/o  PRECAUTIONS: None  WEIGHT BEARING RESTRICTIONS: No  FALLS: Has patient fallen in last 6 months? No  Living  Environment Lives with: lives alone Lives in: Apartment Stairs: No, floor-level apartment  Has following equipment at home: Single point cane and Environmental consultant - 4 wheeled  Prior level of function: Independent with community mobility with device  Hobbies: Walking exercise   Patient Goals:  "Get out of pain"    OBJECTIVE:   Gross Musculoskeletal Assessment Tremor: None Bulk: Normal Tone: Normal No visible step-off along spinal column Dextrocurvature of the lumbar spine   GAIT: Distance walked: 40 ft  Assistive device utilized: Single point cane Level of assistance: Modified independence Comments: Dec stance time, ipslateral sidebend with each step, significant calcaneal valgus R>L and dynamic genu valgum; pes planus   Posture: Lumbar lordosis: WNL Increased thoracic kyphosis, rounded shoulders Iliac crest height: Equal bilaterally Lumbar lateral shift: Negative   AROM AROM (Normal range in degrees) AROM  09/26/22  Lumbar   Flexion (65) 75%*  Extension (30) 50%*  Right lateral flexion (25) 75%* (pain L flank)  Left lateral flexion (25) 75* (mild pain L glute)  Right rotation (30) 50%  Left rotation (30) 50%      Hip Right Left  Flexion (125) WNL WNL  Extension (15)    Abduction (40) 40 ("pull") 40  Adduction     Internal Rotation (45) 15 15  External Rotation (45) WNL WNL      (* = pain; Blank rows = not tested)     LE MMT: MMT (out of 5) Right 10/01/22 Left 10/01/22  Hip flexion 4- 4+  Hip extension    Hip abduction (seated) 5 5  Hip adduction (seated) 5 5  Hip internal rotation    Hip external rotation    Knee flexion 4+ 4+  Knee extension 4+ 5  Ankle dorsiflexion 4 5  Ankle plantarflexion    Ankle inversion    Ankle eversion    (* = pain; Blank rows = not tested)  Sensation Grossly intact to light touch throughout bilateral LEs as determined by testing dermatomes L2-S2. Proprioception, stereognosis, and hot/cold testing deferred on this  date.  Reflexes R/L Knee Jerk (L3/4): 2+/2+  Ankle Jerk (S1/2): Unable to obtain  Muscle Length Hamstrings: R: Positive L: Positive E[place] (quadriceps): R: Not examined L: Not examined   Palpation Location Right Left         Lumbar paraspinals 2 2  Quadratus Lumborum    Gluteus Maximus 1 1  Gluteus Medius 1 1  Deep hip external rotators    PSIS 1 1  Fortin's Area (SIJ)    Greater Trochanter    (Blank rows = not tested) Graded on 0-4 scale (0 = no pain, 1 = pain, 2 = pain with wincing/grimacing/flinching, 3 = pain with withdrawal, 4 = unwilling to allow palpation)  Passive Accessory Intervertebral Motion Pt has reproduction of back pain with CPA L1-L5. Generally, hypomobile throughout mid to lower lumbar spine.   Special Tests Lumbar Radiculopathy and Discogenic: Centralization and Peripheralization (SN 92, -LR 0.12): Not examined Slump (SN 83, -LR 0.32): R: Negative L: Negative SLR (SN 92, -LR 0.29): R: Negative L:  Negative  Facet Joint: Extension-Rotation (SN 100, -LR 0.0): R: Positive L: Positive  Lumbar  Foraminal Stenosis: Lumbar quadrant (SN 70): R: Negative L: Negative  Hip: FABER (SN 81): R: Positive L: Positive Hip scour (SN 50): R: Negative L: Negative      TODAY'S TREATMENT: DATE: 10/01/2022    SUBJECTIVE STATEMENT:   Patient reports usual stiffness this AM. Patient reports her pain is "not too bad" at arrival. Pt reports pain along low back, but not feeling thigh pain at arrival today. 5-6/10 NPRS ta arrival.    MHP (unbilled) utilized prior to and during manual therapy for analgesic effect and improved soft tissue extensibility; x 5 minutes .    Manual Therapy - for symptom modulation, soft tissue sensitivity and mobility, joint mobility, ROM   General traction in hooklying: Negative for pain relief   STM and IASTM with Hypervolt along R>L L2-S1 erector spinae and R>L gluteal mm    Therapeutic Exercise - for improved soft tissue flexibility  and extensibility as needed for ROM, improved strength as needed to improve performance of CKC activities/functional movements  Completion of MMTs* (see chart above)  Lower trunk rotation; 1x10 alternating R/L, 2 sec hold Piriformis stretch, RLE today; 1x10, 1 sec hold  Hip ABD/ADD isometrics (belt/ball); x10, 5 sec   PATIENT EDUCATION: Discussed expected progression of PT.     PATIENT EDUCATION:  Education details: see above for patient education details Person educated: Patient Education method: Explanation, Demonstration, and Handouts Education comprehension: verbalized understanding and returned demonstration   HOME EXERCISE PROGRAM:  Access Code: [email or web address] URL: [email or web address] Date: 10/01/2022 Prepared by: Consuela Mimes  Exercises - Supine Lower Trunk Rotation  - 2 x daily - 7 x weekly - 2 sets - 10 reps - 1-2sec hold - Supine Piriformis Stretch with Foot on Ground  - 2 x daily - 7 x weekly - 2 sets - 10 reps - 1-2sec hold    ASSESSMENT:  CLINICAL IMPRESSION: Patient is a 82 y.o. female who was seen today for physical therapy evaluation and treatment for chronic low back pain with 20+ years of back pain with Hx of injections. Pt has remote Hx of lower thoracic kyphoplasty following compression fracture. No Hx of L-spine surgical procedures. Pt seldom has numbness to L shin; pt denies recent sciatic-type symptoms. Negative lower limb tension testing today. Positive extension-rotation bilaterally. Some degree of decreased bone density is expected at patient's age, though no recent bone density scans are in EMR or reported per pt. Pt has current primary impairments in: decreased thoracolumbar AROM, postural changes, decreased strength (as noted per lifting intolerance; MMT to be further assessed next visit), gait changes, taut/tender bilateral lumbar paraspinals and gluteal mm. Pt will continue to benefit from skilled PT services to address deficits and  improve function.   OBJECTIVE IMPAIRMENTS: Abnormal gait, decreased balance, decreased mobility, difficulty walking, decreased ROM, decreased strength, hypomobility, impaired flexibility, postural dysfunction, and pain.   ACTIVITY LIMITATIONS: carrying, lifting, bending, sleeping, transfers, and bed mobility  PARTICIPATION LIMITATIONS: meal prep, cleaning, laundry, driving, and community activity  PERSONAL FACTORS: Age, Past/current experiences, Time since onset of injury/illness/exacerbation, and 1-2 comorbidities: HTN, Type 2 DM  are also affecting patient's functional outcome.   REHAB POTENTIAL: Good  CLINICAL DECISION MAKING: Evolving/moderate complexity  EVALUATION COMPLEXITY: Moderate   GOALS: Goals reviewed with patient? Yes  SHORT TERM GOALS: Target date: 10/19/2022  Pt will be independent with HEP in order to improve strength and decrease back pain to improve pain-free function at home and work. Baseline: 09/26/22: Baseline HEP initiated.  Goal status: INITIAL  LONG TERM GOALS: Target date: 11/09/2022  Pt will increase FOTO to at least 50 to demonstrate significant improvement in function at home and work related to back pain  Baseline: 09/26/22: 40 Goal status: INITIAL  2.  Pt will decrease worst back pain by at least 2 points on the NPRS in order to demonstrate clinically significant reduction in back pain. Baseline: 09/26/22: Pain 10/10 at worst Goal status: INITIAL  3.  Patient will have thoracolumbar AROM up to 75% or greater for all motions without reproduction of pain as needed for reaching items on ground, household chores, bending.     Baseline: 09/26/22: Motion loss in all planes, pain with flexion/extension and bilateral lateral flexion.  Goal status: INITIAL  4.  Pt will complete simulated lifting task with moving weighted items from table to adjacent shelf at shoulder/chest level indicative of improved ability to move household objects to complete  chores Baseline: 09/26/22: Pain with lifting even light-to-moderate weights intermittently.  Goal status: INITIAL   PLAN: PT FREQUENCY: 1-2x/week  PT DURATION: 6 weeks  PLANNED INTERVENTIONS: Therapeutic exercises, Therapeutic activity, Neuromuscular re-education, Balance training, Gait training, Patient/Family education, Self Care, Joint mobilization, Joint manipulation, Vestibular training, Canalith repositioning, Orthotic/Fit training, DME instructions, Dry Needling, Electrical stimulation, Spinal manipulation, Spinal mobilization, Cryotherapy, Moist heat, Taping, Traction, Ultrasound, Ionotophoresis 4mg /ml Dexamethasone, Manual therapy, and Re-evaluation.  PLAN FOR NEXT SESSION: Manual therapy for soft tissue sensitivity of lumbar paraspinals and gluteal mm. Progress with graded motion and isometrics for analgesic effect as tolerated. Update HEP prn.     Consuela Mimes, PT, DPT #[id number]  Gertie Exon, PT 10/01/2022, 9:05 AM

## 2022-10-02 ENCOUNTER — Ambulatory Visit: Admit: 2022-10-02 | Discharge: 2022-10-03 | Payer: MEDICARE

## 2022-10-02 DIAGNOSIS — I5022 Chronic systolic (congestive) heart failure: Principal | ICD-10-CM

## 2022-10-02 DIAGNOSIS — I10 Essential (primary) hypertension: Principal | ICD-10-CM

## 2022-10-02 DIAGNOSIS — Z23 Encounter for immunization: Principal | ICD-10-CM

## 2022-10-02 DIAGNOSIS — I2510 Atherosclerotic heart disease of native coronary artery without angina pectoris: Principal | ICD-10-CM

## 2022-10-02 NOTE — Unmapped (Signed)
DIVISION OF CARDIOLOGY   [facility],   [place]                                                                         Date of Service:  10/02/2022     ASSESSMENT and PLAN:   1. Coronary artery disease involving native coronary artery of native heart without angina pectoris (Primary)  Stable nonobstructive disease, most recent LDL was 81 but she is not consistently taking her statin having cards better compliance and she will need a recheck of her LDL at her next appointment.  Patient continues on aspirin without problem.    2. 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  She continues on spironolactone but is not on Jardiance.  If her Lasix utilization goes up we will consider adding Jardiance.  - INFLUENZA IIV3 HIGH DOSE 37YRS+(FLUZONE)    3. Primary hypertension  Blood pressure was not well-controlled today in clinic and her 1 home blood pressure readings also elevated.  I have asked her to record her blood pressures at home on a consistent basis and bring those back to her next clinic visit.  I have instructed her that if her systolic blood pressures remain greater than 130 we may need to uptitrate her blood pressure regimen.  Currently she is on amlodipine 10 mg daily, lisinopril 30 mg daily, metoprolol succinate 50 mg nightly and spironolactone 25 mg daily.  Could reasonably had her lisinopril increased to 40 mg or try at low-dose thiazide diuretic.  - INFLUENZA IIV3 HIGH DOSE 37YRS+(FLUZONE)    4. Encounter for immunization  Patient was given flu shot today  - INFLUENZA IIV3 HIGH DOSE 37YRS+(FLUZONE)      Return to clinic:  Return in about 5 months (around 03/02/2023).    SUBJECTIVE:     Chief complaint: Cindy Gonzalez is a 82 y.o. female with a past medical history of HFpEF, HTN, mild LV dysfunction seen for routine follow-up of coronary artery disease HFpEF and hypertension.      History of present illness:  Today Cindy Gonzalez presents to the clinic reporting that overall she is doing okay.  She has some occasional shortness of breath that typically is associated with lower oxygen saturations and improves with oxygen utilization.  She does not take her blood pressures very often but her home blood pressure today was 145/77.  She is only needing furosemide twice a week and sometimes will take a dose of metoprolol at bedtime although it is not clear what she is using to trigger that decision.  She otherwise reports no significant bleeding episodes, no syncope or near syncopal type events.    Review of Systems  10 systems were reviewed and negative except as noted in HPI.    Past Medical History:   Diagnosis Date    At risk for falls     left ankle arthritis; cane     Coronary artery disease     Diabetes mellitus (CMS-HCC)     Diffuse large B cell lymphoma (CMS-HCC)     Hypertension     Impaired mobility     left ankle arthritis, cane    Visual impairment     glasses  Past Surgical History:   Procedure Laterality Date    CHOLECYSTECTOMY      HYSTERECTOMY      OOPHORECTOMY         Current Outpatient Medications   Medication Instructions    albuterol HFA 90 mcg/actuation inhaler 2 puffs, Inhalation, Every 6 hours PRN    amlodipine (NORVASC) 10 mg, Oral, Daily (standard)    aspirin (ECOTRIN) 81 mg, Oral, Daily (standard)    atorvastatin (LIPITOR) 10 mg, Oral, Daily (standard)    blood sugar diagnostic (ACCU-CHEK GUIDE TEST STRIPS) Strp Other, 2 times a day (AC)    blood-glucose meter kit Use as instructed    calcium-vitamin D 500 mg(1,250mg ) -200 unit per tablet 1 tablet, Oral, 2 times a day with meals    cyanocobalamin 2,000 mcg, Oral, Daily (standard)    estradiol (ESTRACE) 2 g, Vaginal, Daily (standard), Use externally prn    fenofibrate (TRICOR) 145 mg, Oral, Daily (standard)    fluticasone propionate (FLONASE) 50 mcg/actuation nasal spray 1 spray, Each Nare, Daily (standard)    furosemide (LASIX) 20 mg, Oral, 3 times weekly    hydroxyurea (HYDREA) 500 mg capsule Take 1 capsule (500 mg total) by mouth twice daily Every Mon, Tue, Wed, Thur, Fri  AND 1 capsule (500 mg total) daily Every Saturday and Sunday.    ketoconazole (NIZORAL) 2 % cream APPLY  CREAM TOPICALLY ONCE DAILY FOR SCALP RASH    lancets (ACCU-CHEK SOFTCLIX LANCETS) Misc Use as directed to check glucose twice per day    lancing device Misc USE TO CHECK BLOOD SUGAR 2 TIMES A DAY    lisinopril (PRINIVIL,ZESTRIL) 30 mg, Oral, Daily (standard)    LORazepam (ATIVAN) 0.5 MG tablet TAKE 1/2 (ONE-HALF) TABLET BY MOUTH TWICE DAILY AS NEEDED FOR ANXIETY    meloxicam (MOBIC) 7.5 mg, Oral, Daily (standard)    metFORMIN (GLUCOPHAGE) 500 MG tablet TAKE 1 TABLET BY MOUTH IN THE MORNING AND 1/2 (ONE-HALF) ONCE DAILY WITH SUPPER    metoPROLOL succinate (TOPROL-XL) 50 mg, Oral, Nightly    mupirocin (BACTROBAN) 2 % cream 1 Application, Topical, 2 times a day    mupirocin (BACTROBAN) 2 % ointment 1 Application, Topical, 2 times a day    nystatin (MYCOSTATIN) 100,000 unit/gram cream Topical, 2 times a day (standard)    RESTASIS 0.05 % ophthalmic emulsion INSTILL 1 DROP INTO EACH EYE TWICE DAILY    spironolactone (ALDACTONE) 25 mg, Oral, Daily (standard)    triamcinolone (KENALOG) 0.1 % cream 1 Application, Topical, 2 times a day      Allergies:  is allergic to amoxicillin, cephalexin, ciprofloxacin, hydrocodone-acetaminophen, and sulfamethoxazole.    Social History:  She  reports that she has never smoked. She has never used smokeless tobacco. She reports that she does not currently use alcohol. She reports that she does not use drugs.    Family History:  Her family history includes Asthma in her mother; Cancer in an other family member; Diabetes in her mother; Hypertension in her father; Stroke in her father.    OBJECTIVE:      BP 166/71  - Pulse 76  - Wt 79.9 kg (176 lb 1.6 oz)  - LMP  (LMP Unknown)  - SpO2 97%  - BMI 32.21 kg/m??    Wt Readings from Last 3 Encounters:   10/15/22 78.9 kg (174 lb)   10/02/22 79.9 kg (176 lb 1.6 oz)   09/18/22 79.7 kg (175 lb 11.3 oz)     BP Readings from Last 5 Encounters:  10/15/22 140/70   10/02/22 166/71   09/18/22 154/67   08/16/22 112/60   07/11/22 136/72     General-  Normal appearing female in no apparent distress.  Neurologic- Alert and oriented X3.  Cranial nerve II-XII grossly intact.  HEENT-  Normocephalic atraumatic head.  No scleral icterus.  Wearing face mask.  Neck- Supple, no carotid bruis, jugular venous pulsation 7 cm water.  Lungs- Clear to auscultation, no wheezes, rhonchi, or rhales.  Heart-regular rhythm and rate without murmur rub or gallop.  Abdomen- Soft, nontender, no organomegally.  Extremities-  No clubbing or cyanosis.  No pitting edema to lower extremities bilaterally  Pulses-2+ pulses in radial and dorsalis pedis bilaterally.  Psych- Normal mood, appropriate.    Lab Results   Component Value Date    HGB 13.5 09/18/2022    HGB 12.9 08/16/2022    HGB 12.5 07/23/2022    PLT 160 09/18/2022    PLT 147 (L) 08/16/2022    PLT 141 (L) 07/23/2022     Lab Results   Component Value Date    CREATININE 0.93 09/18/2022    CREATININE 0.93 08/16/2022    CREATININE 1.00 07/23/2022    K 4.6 09/18/2022    K 4.7 08/16/2022    K 4.6 07/23/2022      Lab Results   Component Value Date    BNP 54.86 10/14/2021    BNP 39.19 02/14/2021    BNP 53.50 07/26/2019     Lab Results   Component Value Date    PROBNP 207.0 10/16/2021     Lab Results   Component Value Date    CHOL 152 06/14/2022    CHOL 143 03/06/2022    LDL 81 06/14/2022    LDL 69 03/06/2022    HDL 37 (L) 06/14/2022    HDL 39 (L) 03/06/2022    TRIG 171 (H) 06/14/2022    TRIG 177 (H) 03/06/2022     Lab Results   Component Value Date    A1C 6.7 (H) 10/15/2022     Electrocardiogram:  From 10/14/21 showed: 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 Left axis deviation. LBBB.     From 02/14/21 showed: SR with 1st degree AV block, left axis deviation, LBBB.     From 03/04/20 showed: sinus tach 114bpm, left axis deviation, LBBB (previously IVCD). From 07/20/19 showed: SR, 1st degree AV block w/ occasional premature ventricular beats, left axis deviation, intraventricular conduction delay, possible anterolateral infarct.    Echocardiogram:  TTE From 08/16/21 showed: The left ventricle is normal in size with upper normal wall thickness. The left ventricular systolic function is normal, LVEF is visually estimated at 55-60%. Mitral annular calcification is present (moderate). The right ventricle is normal in size, with normal systolic function. There is no RV thrombus.    From 04/12/21 showed: LVEF 35-40%. Mitral annular calcification is present (moderate). The aortic valve is trileaflet with mildly thickened leaflets with mildly reduced excursion. The right ventricle is normal in size, with moderately reduced systolic function. There is an echo density within the RV apex that likely represents trabeculation but thrombus or mass can not be excluded.  Recommend clinical correlation and if appropriate, complimentary diagnostic testing.     From 07/21/19 showed: LV normal in size w/ mildly increased wall thickness, LVEF 45%. Mild mitral annular calcification present. Mild aortic valve stenosis. RV normal in size, w/ normal systolic function.    Ziopatch:  From 2/21-3/07/23 showed: patient had a min HR of 50 bpm, max  HR of 154 bpm, and avg HR of 80 bpm. First Degree AV Block was present. Bundle Branch Block/IVCD was present. Predominant underlying rhythm was Sinus Rhythm. 4 Supraventricular Tachycardia runs occurred, the run with the fastest interval lasting 8 beats with a max rate of 154 bpm (avg 128 bpm); the run with the fastest interval was also the longest. Isolated SVEs were rare (<1.0%), SVE Couplets were rare (<1.0%), and SVE Triplets were rare (<1.0%). Isolated VEs were rare (<1.0%), VE Couplets were rare (<1.0%), and no VE Triplets were present. Ventricular Bigeminy and Trigeminy were present.  Symptoms associated with isolated ventricular ectopic beats. CTA chest:  From 02/14/21 showed: cardiac chambers normal in size.    Ascending and descending aorta normal in caliber.    There is no pericardial effusion. Coronary calcification.     Cardiac Catheterization:  From 2014 showed: 30% mLAD, calcification LMCA.    Lipid panel:  Component      Latest Ref Rng 06/14/2022   Triglycerides      0 - 150 mg/dL 161 (H)    Cholesterol      <=200 mg/dL 096    HDL      40 - 60 mg/dL 37 (L)    LDL calculated      40 - 99 mg/dL 81    VLDL Cholesterol Cal      11 - 41 mg/dL 04.5    Chol/HDL Ratio      1.0 - 4.5  4.1    Non-HDL Cholesterol      70 - 409 mg/dL 811    FASTING Unknown       Legend:  (H) High  (L) Low

## 2022-10-02 NOTE — Unmapped (Signed)
Pt in  for follow up Denies CP. Has occasional DOE. Which she uses o2 @ 2liters as needed when stat is 93-94 pt reports she just took her BP meds not long before she came to her visit today. Pt states home BP 145 77 today. Pt states takes her furosemide twice a week. States sometimes takes one-half (25mg )metoprolol at bedtime, as she is afraid her BP will drop too low. Influenza vaccine given today

## 2022-10-03 ENCOUNTER — Encounter: Payer: Self-pay | Admitting: Physical Therapy

## 2022-10-03 ENCOUNTER — Ambulatory Visit: Payer: Medicare HMO | Attending: Physical Medicine & Rehabilitation | Admitting: Physical Therapy

## 2022-10-03 DIAGNOSIS — M545 Low back pain, unspecified: Secondary | ICD-10-CM | POA: Diagnosis present

## 2022-10-03 DIAGNOSIS — M25551 Pain in right hip: Secondary | ICD-10-CM | POA: Insufficient documentation

## 2022-10-03 DIAGNOSIS — M6281 Muscle weakness (generalized): Secondary | ICD-10-CM | POA: Diagnosis present

## 2022-10-03 DIAGNOSIS — G8929 Other chronic pain: Secondary | ICD-10-CM | POA: Diagnosis present

## 2022-10-03 DIAGNOSIS — M25552 Pain in left hip: Secondary | ICD-10-CM | POA: Diagnosis present

## 2022-10-03 NOTE — Therapy (Signed)
OUTPATIENT PHYSICAL THERAPY TREATMENT   Patient Name: Jacqueline Arroyo MRN: [id number] DOB:1940-03-01, 82 y.o., female Today's Date: 10/03/2022  END OF SESSION:  PT End of Session - 10/03/22 0850     Visit Number 3    Number of Visits 13    Date for PT Re-Evaluation 11/08/22    Authorization Type [employer]    PT Start Time 0854    PT Stop Time 0942    PT Time Calculation (min) 48 min    Equipment Utilized During Treatment --   pt utilizes personal SPC to negotiate office   Activity Tolerance Patient limited by pain;Patient tolerated treatment well    Behavior During Therapy [facility] for tasks assessed/performed               Past Medical History:  Diagnosis Date   Diabetes mellitus without complication (HCC)    Hypertension    Past Surgical History:  Procedure Laterality Date   ABDOMINAL HYSTERECTOMY     BACK SURGERY     CHOLECYSTECTOMY     FOOT SURGERY     There are no problems to display for this patient.   PCP: [facility]  REFERRING PROVIDER: Carnella Guadalajara, DO  REFERRING DIAG:  M54.50 (ICD-10-CM) - Low back pain, unspecified  G89.29 (ICD-10-CM) - Other chronic pain    RATIONALE FOR EVALUATION AND TREATMENT: Rehabilitation  THERAPY DIAG: Chronic bilateral low back pain without sciatica  Bilateral hip pain  Muscle weakness (generalized)  ONSET DATE: 07/15/22 (most recent flare-up resulting in visit to [facility] ED)  FOLLOW-UP APPT SCHEDULED WITH REFERRING PROVIDER: None currently on schedule  PERTINENT HISTORY: Patient is an 82 year old female referred for chronic low back pain; 20+ years of pain with prior episodes of PT and lumbar spine injections. Pt reports being shaky in the morning and having intermittent tremor. Hx of kyphoplasty in midback per patient for compression fracture. Patient reports pain goes from one hip to the other. Pt reports pain down into R thigh. Back symptoms described as soreness at rest. Pt had to use Tramadol for  significant breakthrough pain. No recent sciatica. Some sensation of "swelling" in R lateral thigh. 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   PAIN:    Pain Intensity: Present: 4-5/10, Best: 4/10, Worst: 10/10 Pain location: Across waist and bilateral gluteal region Pain Quality:  sore/tender   Radiating: Yes , to bilat glutes, bilateral flank region  Numbness/Tingling: Yes; seldom numbness in L shin  Focal Weakness: No Aggravating factors: Lifting (sometimes even bottle of water), lying on R side/sometimes on L Relieving factors: Meloxicam/Bengay/Tylenol; Tramadol prn; lying on back and stretching out in bed; lying down; sitting  24-hour pain behavior: sometimes worse in AM, sometimes bad all day long   History of prior back injury, pain, surgery, or therapy: Yes; prior PT, injections, chronic back pain   Imaging: Yes ;  EXAM: XR LUMBAR SPINE AP LATERAL AND OBLIQUES DATE: 06/25/2022 1:14 PM ACCESSION: [id number] UN DICTATED: 06/25/2022 1:16 PM INTERPRETATION LOCATION: [facility]  CLINICAL INDICATION: 82 years old Female with lower back pain    COMPARISON: CT abdomen pelvis 03/15/2022.  TECHNIQUE: AP and oblique views of the lumbar spine. A lateral view is not provided  FINDINGS: No acute fractures. Sequela of T11 and T12 vertebral augmentation.  Dextrocurvature of the lumbar spine. No anterior or retrolisthesis on oblique views. Disc narrowing, endplate sclerosis, and osteophytosis greatest at L4-L5 and L5-S1. Facet joint arthropathy greatest at L4-L5 and L5-S1.  Vascular calcifications.  Red flags: Negative for bowel/bladder changes, saddle paresthesia, personal history of cancer, h/o spinal tumors, h/o compression fx, h/o abdominal aneurysm, abdominal pain, chills/fever, night sweats, nausea, vomiting, unrelenting pain, first onset of insidious LBP <20 y/o  PRECAUTIONS: None  WEIGHT BEARING RESTRICTIONS: No  FALLS: Has patient fallen in last 6 months?  No  Living Environment Lives with: lives alone Lives in: Apartment Stairs: No, floor-level apartment  Has following equipment at home: Single point cane and Environmental consultant - 4 wheeled  Prior level of function: Independent with community mobility with device  Hobbies: Walking exercise   Patient Goals:  "Get out of pain"    OBJECTIVE:   Gross Musculoskeletal Assessment Tremor: None Bulk: Normal Tone: Normal No visible step-off along spinal column Dextrocurvature of the lumbar spine   GAIT: Distance walked: 40 ft  Assistive device utilized: Single point cane Level of assistance: Modified independence Comments: Dec stance time, ipslateral sidebend with each step, significant calcaneal valgus R>L and dynamic genu valgum; pes planus   Posture: Lumbar lordosis: WNL Increased thoracic kyphosis, rounded shoulders Iliac crest height: Equal bilaterally Lumbar lateral shift: Negative   AROM AROM (Normal range in degrees) AROM  09/26/22  Lumbar   Flexion (65) 75%*  Extension (30) 50%*  Right lateral flexion (25) 75%* (pain L flank)  Left lateral flexion (25) 75* (mild pain L glute)  Right rotation (30) 50%  Left rotation (30) 50%      Hip Right Left  Flexion (125) WNL WNL  Extension (15)    Abduction (40) 40 ("pull") 40  Adduction     Internal Rotation (45) 15 15  External Rotation (45) WNL WNL      (* = pain; Blank rows = not tested)     LE MMT: MMT (out of 5) Right 10/01/22 Left 10/01/22  Hip flexion 4- 4+  Hip extension    Hip abduction (seated) 5 5  Hip adduction (seated) 5 5  Hip internal rotation    Hip external rotation    Knee flexion 4+ 4+  Knee extension 4+ 5  Ankle dorsiflexion 4 5  Ankle plantarflexion    Ankle inversion    Ankle eversion    (* = pain; Blank rows = not tested)  Sensation Grossly intact to light touch throughout bilateral LEs as determined by testing dermatomes L2-S2. Proprioception, stereognosis, and hot/cold testing deferred on  this date.  Reflexes R/L Knee Jerk (L3/4): 2+/2+  Ankle Jerk (S1/2): Unable to obtain  Muscle Length Hamstrings: R: Positive L: Positive [place] (quadriceps): R: Not examined L: Not examined   Palpation Location Right Left         Lumbar paraspinals 2 2  Quadratus Lumborum    Gluteus Maximus 1 1  Gluteus Medius 1 1  Deep hip external rotators    PSIS 1 1  Fortin's Area (SIJ)    Greater Trochanter    (Blank rows = not tested) Graded on 0-4 scale (0 = no pain, 1 = pain, 2 = pain with wincing/grimacing/flinching, 3 = pain with withdrawal, 4 = unwilling to allow palpation)  Passive Accessory Intervertebral Motion Pt has reproduction of back pain with CPA L1-L5. Generally, hypomobile throughout mid to lower lumbar spine.   Special Tests Lumbar Radiculopathy and Discogenic: Centralization and Peripheralization (SN 92, -LR 0.12): Not examined Slump (SN 83, -LR 0.32): R: Negative L: Negative SLR (SN 92, -LR 0.29): R: Negative L:  Negative  Facet Joint: Extension-Rotation (SN 100, -LR 0.0): R: Positive L: Positive  Lumbar  Foraminal Stenosis: Lumbar quadrant (SN 70): R: Negative L: Negative  Hip: FABER (SN 81): R: Positive L: Positive Hip scour (SN 50): R: Negative L: Negative      TODAY'S TREATMENT: DATE: 10/03/2022    SUBJECTIVE STATEMENT:   Patient reports minimal pain at arrival. She reports some soreness across waistline; no recent RLE referred pain. Pt is compliant with HEP. She reports some good days and some bad days with back pain. She reports pain with riding in her son's truck the other day.    MHP (unbilled) utilized prior to and during manual therapy for analgesic effect and improved soft tissue extensibility; x 5 minutes .    Manual Therapy - for symptom modulation, soft tissue sensitivity and mobility, joint mobility, ROM   STM and IASTM with [employer] along R>L L2-S1 erector spinae and R>L gluteal mm; x 15 minutes   *not today* General traction in  hooklying: Negative for pain relief     Therapeutic Exercise - for improved soft tissue flexibility and extensibility as needed for ROM, improved strength as needed to improve performance of CKC activities/functional movements   Lower trunk rotation; 1x10 alternating R/L, 2 sec hold Bow and arrow open book; 1x10 on each side   Posterior pelvic tilt; 2x10  -PT demo and cueing for technique  PPT with Bridge; 2x8  PATIENT EDUCATION: HEP update and review. We discussed expected progression of PT. We discussed proper positioning when sitting for prolonged period in motor vehicle.    *not today* Hip ABD/ADD isometrics (belt/ball); x10, 5 sec -for analgesic effect and graded loading to improve tolerance of loading affected tissues  Piriformis stretch, RLE today; 1x10, 1 sec hold   PATIENT EDUCATION:  Education details: see above for patient education details Person educated: Patient Education method: Explanation, Demonstration, and Handouts Education comprehension: verbalized understanding and returned demonstration   HOME EXERCISE PROGRAM:  Access Code: [email or web address] URL: [email or web address] Date: 10/03/2022 Prepared by: Consuela Mimes  Exercises - Supine Lower Trunk Rotation  - 2 x daily - 7 x weekly - 2 sets - 10 reps - 1-2sec hold - Supine Piriformis Stretch with Foot on Ground  - 2 x daily - 7 x weekly - 2 sets - 10 reps - 1-2sec hold - Sidelying Bow and Arrow Stretch  - 2 x daily - 7 x weekly - 2 sets - 10 reps - 2-3 sec hold - Supine Posterior Pelvic Tilt  - 2 x daily - 7 x weekly - 2 sets - 10 reps - Supine Hamstring Stretch  - 2 x daily - 7 x weekly - 2 sets - 10 reps - 1-2sec hold   ASSESSMENT:  CLINICAL IMPRESSION: Patient tolerates modest progression of exercise well. Pt has mild tightness/tenderness of gluteus medius/gluteal musculature. Primary region of sensitivity is along R>L lower lumbar paraspinal mm. No recent paresthesias or referred pain  since last visit. We reviewed activity modification and postural correction for sitting position. HEP was updated for lying thoracolumbar mobility work, active hamstring stretching, and posterior pelvic tilt. Pt has remaining deficits in: decreased thoracolumbar AROM, postural changes, decreased strength, gait changes, taut/tender bilateral lumbar paraspinals and gluteal mm. Pt will continue to benefit from skilled PT services to address deficits and improve function.   OBJECTIVE IMPAIRMENTS: Abnormal gait, decreased balance, decreased mobility, difficulty walking, decreased ROM, decreased strength, hypomobility, impaired flexibility, postural dysfunction, and pain.   ACTIVITY LIMITATIONS: carrying, lifting, bending, sleeping, transfers, and bed mobility  PARTICIPATION LIMITATIONS: meal prep, cleaning, laundry, driving, and community  activity  PERSONAL FACTORS: Age, Past/current experiences, Time since onset of injury/illness/exacerbation, and 1-2 comorbidities: HTN, Type 2 DM  are also affecting patient's functional outcome.   REHAB POTENTIAL: Good  CLINICAL DECISION MAKING: Evolving/moderate complexity  EVALUATION COMPLEXITY: Moderate   GOALS: Goals reviewed with patient? Yes  SHORT TERM GOALS: Target date: 10/19/2022  Pt will be independent with HEP in order to improve strength and decrease back pain to improve pain-free function at home and work. Baseline: 09/26/22: Baseline HEP initiated.  Goal status: INITIAL   LONG TERM GOALS: Target date: 11/09/2022  Pt will increase FOTO to at least 50 to demonstrate significant improvement in function at home and work related to back pain  Baseline: 09/26/22: 40 Goal status: INITIAL  2.  Pt will decrease worst back pain by at least 2 points on the NPRS in order to demonstrate clinically significant reduction in back pain. Baseline: 09/26/22: Pain 10/10 at worst Goal status: INITIAL  3.  Patient will have thoracolumbar AROM up to 75% or  greater for all motions without reproduction of pain as needed for reaching items on ground, household chores, bending.     Baseline: 09/26/22: Motion loss in all planes, pain with flexion/extension and bilateral lateral flexion.  Goal status: INITIAL  4.  Pt will complete simulated lifting task with moving weighted items from table to adjacent shelf at shoulder/chest level indicative of improved ability to move household objects to complete chores Baseline: 09/26/22: Pain with lifting even light-to-moderate weights intermittently.  Goal status: INITIAL   PLAN: PT FREQUENCY: 1-2x/week  PT DURATION: 6 weeks  PLANNED INTERVENTIONS: Therapeutic exercises, Therapeutic activity, Neuromuscular re-education, Balance training, Gait training, Patient/Family education, Self Care, Joint mobilization, Joint manipulation, Vestibular training, Canalith repositioning, Orthotic/Fit training, DME instructions, Dry Needling, Electrical stimulation, Spinal manipulation, Spinal mobilization, Cryotherapy, Moist heat, Taping, Traction, Ultrasound, Ionotophoresis 4mg /ml Dexamethasone, Manual therapy, and Re-evaluation.  PLAN FOR NEXT SESSION: Manual therapy for soft tissue sensitivity of lumbar paraspinals and gluteal mm. Progress with graded motion and isometrics for analgesic effect as tolerated. Update HEP as needed for successive visits.     Consuela Mimes, PT, DPT #[id number]  Gertie Exon, PT 10/03/2022, 9:52 AM

## 2022-10-08 ENCOUNTER — Encounter: Payer: Self-pay | Admitting: Physical Therapy

## 2022-10-08 ENCOUNTER — Ambulatory Visit: Payer: Medicare HMO | Attending: Physical Medicine & Rehabilitation | Admitting: Physical Therapy

## 2022-10-08 DIAGNOSIS — M25552 Pain in left hip: Secondary | ICD-10-CM | POA: Diagnosis present

## 2022-10-08 DIAGNOSIS — G8929 Other chronic pain: Secondary | ICD-10-CM | POA: Insufficient documentation

## 2022-10-08 DIAGNOSIS — M6281 Muscle weakness (generalized): Secondary | ICD-10-CM | POA: Insufficient documentation

## 2022-10-08 DIAGNOSIS — M25551 Pain in right hip: Secondary | ICD-10-CM | POA: Diagnosis present

## 2022-10-08 DIAGNOSIS — M545 Low back pain, unspecified: Secondary | ICD-10-CM | POA: Insufficient documentation

## 2022-10-08 NOTE — Therapy (Signed)
OUTPATIENT PHYSICAL THERAPY TREATMENT   Patient Name: Jacqueline Arroyo MRN: [id number] DOB:June 10, 1940, 82 y.o., female Today's Date: 10/08/2022  END OF SESSION:  PT End of Session - 10/08/22 0859     Visit Number 4    Number of Visits 13    Date for PT Re-Evaluation 11/08/22    Authorization Type [employer]    PT Start Time 0906    PT Stop Time 0948    PT Time Calculation (min) 42 min    Equipment Utilized During Treatment --   pt utilizes personal SPC to negotiate office   Activity Tolerance Patient limited by pain;Patient tolerated treatment well    Behavior During Therapy [facility] for tasks assessed/performed              Past Medical History:  Diagnosis Date   Diabetes mellitus without complication (HCC)    Hypertension    Past Surgical History:  Procedure Laterality Date   ABDOMINAL HYSTERECTOMY     BACK SURGERY     CHOLECYSTECTOMY     FOOT SURGERY     There are no problems to display for this patient.   PCP: [facility]  REFERRING PROVIDER: Carnella Guadalajara, DO  REFERRING DIAG:  M54.50 (ICD-10-CM) - Low back pain, unspecified  G89.29 (ICD-10-CM) - Other chronic pain    RATIONALE FOR EVALUATION AND TREATMENT: Rehabilitation  THERAPY DIAG: Chronic bilateral low back pain without sciatica  Bilateral hip pain  Muscle weakness (generalized)  ONSET DATE: 07/15/22 (most recent flare-up resulting in visit to [facility] ED)  FOLLOW-UP APPT SCHEDULED WITH REFERRING PROVIDER: None currently on schedule  PERTINENT HISTORY: Patient is an 82 year old female referred for chronic low back pain; 20+ years of pain with prior episodes of PT and lumbar spine injections. Pt reports being shaky in the morning and having intermittent tremor. Hx of kyphoplasty in midback per patient for compression fracture. Patient reports pain goes from one hip to the other. Pt reports pain down into R thigh. Back symptoms described as soreness at rest. Pt had to use Tramadol for significant  breakthrough pain. No recent sciatica. Some sensation of "swelling" in R lateral thigh. 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   PAIN:    Pain Intensity: Present: 4-5/10, Best: 4/10, Worst: 10/10 Pain location: Across waist and bilateral gluteal region Pain Quality:  sore/tender   Radiating: Yes , to bilat glutes, bilateral flank region  Numbness/Tingling: Yes; seldom numbness in L shin  Focal Weakness: No Aggravating factors: Lifting (sometimes even bottle of water), lying on R side/sometimes on L Relieving factors: Meloxicam/Bengay/Tylenol; Tramadol prn; lying on back and stretching out in bed; lying down; sitting  24-hour pain behavior: sometimes worse in AM, sometimes bad all day long   History of prior back injury, pain, surgery, or therapy: Yes; prior PT, injections, chronic back pain   Imaging: Yes ;  EXAM: XR LUMBAR SPINE AP LATERAL AND OBLIQUES DATE: 06/25/2022 1:14 PM ACCESSION: [id number] UN DICTATED: 06/25/2022 1:16 PM INTERPRETATION LOCATION: [facility]  CLINICAL INDICATION: 82 years old Female with lower back pain    COMPARISON: CT abdomen pelvis 03/15/2022.  TECHNIQUE: AP and oblique views of the lumbar spine. A lateral view is not provided  FINDINGS: No acute fractures. Sequela of T11 and T12 vertebral augmentation.  Dextrocurvature of the lumbar spine. No anterior or retrolisthesis on oblique views. Disc narrowing, endplate sclerosis, and osteophytosis greatest at L4-L5 and L5-S1. Facet joint arthropathy greatest at L4-L5 and L5-S1.  Vascular calcifications.  Red flags: Negative for bowel/bladder changes, saddle paresthesia, personal history of cancer, h/o spinal tumors, h/o compression fx, h/o abdominal aneurysm, abdominal pain, chills/fever, night sweats, nausea, vomiting, unrelenting pain, first onset of insidious LBP <20 y/o  PRECAUTIONS: None  WEIGHT BEARING RESTRICTIONS: No  FALLS: Has patient fallen in last 6 months? No  Living  Environment Lives with: lives alone Lives in: Apartment Stairs: No, floor-level apartment  Has following equipment at home: Single point cane and Environmental consultant - 4 wheeled  Prior level of function: Independent with community mobility with device  Hobbies: Walking exercise   Patient Goals:  "Get out of pain"    OBJECTIVE:   Gross Musculoskeletal Assessment Tremor: None Bulk: Normal Tone: Normal No visible step-off along spinal column Dextrocurvature of the lumbar spine   GAIT: Distance walked: 40 ft  Assistive device utilized: Single point cane Level of assistance: Modified independence Comments: Dec stance time, ipslateral sidebend with each step, significant calcaneal valgus R>L and dynamic genu valgum; pes planus   Posture: Lumbar lordosis: WNL Increased thoracic kyphosis, rounded shoulders Iliac crest height: Equal bilaterally Lumbar lateral shift: Negative   AROM AROM (Normal range in degrees) AROM  09/26/22  Lumbar   Flexion (65) 75%*  Extension (30) 50%*  Right lateral flexion (25) 75%* (pain L flank)  Left lateral flexion (25) 75* (mild pain L glute)  Right rotation (30) 50%  Left rotation (30) 50%      Hip Right Left  Flexion (125) WNL WNL  Extension (15)    Abduction (40) 40 ("pull") 40  Adduction     Internal Rotation (45) 15 15  External Rotation (45) WNL WNL      (* = pain; Blank rows = not tested)     LE MMT: MMT (out of 5) Right 10/01/22 Left 10/01/22  Hip flexion 4- 4+  Hip extension    Hip abduction (seated) 5 5  Hip adduction (seated) 5 5  Hip internal rotation    Hip external rotation    Knee flexion 4+ 4+  Knee extension 4+ 5  Ankle dorsiflexion 4 5  Ankle plantarflexion    Ankle inversion    Ankle eversion    (* = pain; Blank rows = not tested)  Sensation Grossly intact to light touch throughout bilateral LEs as determined by testing dermatomes L2-S2. Proprioception, stereognosis, and hot/cold testing deferred on this  date.  Reflexes R/L Knee Jerk (L3/4): 2+/2+  Ankle Jerk (S1/2): Unable to obtain  Muscle Length Hamstrings: R: Positive L: Positive [place] (quadriceps): R: Not examined L: Not examined   Palpation Location Right Left         Lumbar paraspinals 2 2  Quadratus Lumborum    Gluteus Maximus 1 1  Gluteus Medius 1 1  Deep hip external rotators    PSIS 1 1  Fortin's Area (SIJ)    Greater Trochanter    (Blank rows = not tested) Graded on 0-4 scale (0 = no pain, 1 = pain, 2 = pain with wincing/grimacing/flinching, 3 = pain with withdrawal, 4 = unwilling to allow palpation)  Passive Accessory Intervertebral Motion Pt has reproduction of back pain with CPA L1-L5. Generally, hypomobile throughout mid to lower lumbar spine.   Special Tests Lumbar Radiculopathy and Discogenic: Centralization and Peripheralization (SN 92, -LR 0.12): Not examined Slump (SN 83, -LR 0.32): R: Negative L: Negative SLR (SN 92, -LR 0.29): R: Negative L:  Negative  Facet Joint: Extension-Rotation (SN 100, -LR 0.0): R: Positive L: Positive  Lumbar  Foraminal Stenosis: Lumbar quadrant (SN 70): R: Negative L: Negative  Hip: FABER (SN 81): R: Positive L: Positive Hip scour (SN 50): R: Negative L: Negative      TODAY'S TREATMENT: DATE: 10/08/2022    SUBJECTIVE STATEMENT:   Patient reports 8/10 pain at arrival. Pt reports notable stiffness and pain after having company this past weekend - she reports having big grocery trip to stock up her kitchen prior to having company. Patient reports pain across waistline. Pt reports limited HEP performance over the weekend due to being busy with company.    MHP (unbilled) utilized prior to and during manual therapy for analgesic effect and improved soft tissue extensibility; x 5 minutes .    Manual Therapy - for symptom modulation, soft tissue sensitivity and mobility, joint mobility, ROM   STM and IASTM with [employer] along R>L L2-S1 erector spinae and R>L gluteal  mm; x 15 minutes   Pain with CPA L3-5, held on continued mobilization   *not today* General traction in hooklying: Negative for pain relief     Therapeutic Exercise - for improved soft tissue flexibility and extensibility as needed for ROM, improved strength as needed to improve performance of CKC activities/functional movements   Lower trunk rotation; 1x10 alternating R/L, 2 sec hold  Piriformis stretch, RLE today; x20; 1 sec hold  Hip ABD isometrics (belt fastened above knees); x10, 5 sec -for analgesic effect and graded loading to improve tolerance of loading affected tissues   PPT with Bridge; 2x8  -intermittent muscle cramping in hamstrings; exercise stopped momentarily prn   PATIENT EDUCATION: HEP update and review. We discussed expected progression of PT.    *not today* Posterior pelvic tilt; 2x10  -PT demo and cueing for technique Bow and arrow open book; 1x10 on each side    PATIENT EDUCATION:  Education details: see above for patient education details Person educated: Patient Education method: Explanation, Demonstration, and Handouts Education comprehension: verbalized understanding and returned demonstration   HOME EXERCISE PROGRAM:  Access Code: [email or web address] URL: [email or web address] Date: 10/03/2022 Prepared by: Consuela Mimes  Exercises - Supine Lower Trunk Rotation  - 2 x daily - 7 x weekly - 2 sets - 10 reps - 1-2sec hold - Supine Piriformis Stretch with Foot on Ground  - 2 x daily - 7 x weekly - 2 sets - 10 reps - 1-2sec hold - Sidelying Bow and Arrow Stretch  - 2 x daily - 7 x weekly - 2 sets - 10 reps - 2-3 sec hold - Supine Posterior Pelvic Tilt  - 2 x daily - 7 x weekly - 2 sets - 10 reps - Supine Hamstring Stretch  - 2 x daily - 7 x weekly - 2 sets - 10 reps - 1-2sec hold   ASSESSMENT:  CLINICAL IMPRESSION: Patient had 8/10 pain this AM related to aggravation of back pain on top of continuous low to moderate pain across  waistline. Pt does not report LE referred symptoms, but she does have pain primarily along R iliolumbar region and R buttock. Patient responds well with manual therapy and is able to modestly progress with mobility exercise and isometric drills in clinic today. Pt has remaining deficits in: decreased thoracolumbar AROM, postural changes, decreased strength, gait changes, taut/tender bilateral lumbar paraspinals and gluteal mm. Pt will continue to benefit from skilled PT services to address deficits and improve function.   OBJECTIVE IMPAIRMENTS: Abnormal gait, decreased balance, decreased mobility, difficulty walking, decreased ROM, decreased strength, hypomobility, impaired flexibility, postural dysfunction, and  pain.   ACTIVITY LIMITATIONS: carrying, lifting, bending, sleeping, transfers, and bed mobility  PARTICIPATION LIMITATIONS: meal prep, cleaning, laundry, driving, and community activity  PERSONAL FACTORS: Age, Past/current experiences, Time since onset of injury/illness/exacerbation, and 1-2 comorbidities: HTN, Type 2 DM  are also affecting patient's functional outcome.   REHAB POTENTIAL: Good  CLINICAL DECISION MAKING: Evolving/moderate complexity  EVALUATION COMPLEXITY: Moderate   GOALS: Goals reviewed with patient? Yes  SHORT TERM GOALS: Target date: 10/19/2022  Pt will be independent with HEP in order to improve strength and decrease back pain to improve pain-free function at home and work. Baseline: 09/26/22: Baseline HEP initiated.  Goal status: INITIAL   LONG TERM GOALS: Target date: 11/09/2022  Pt will increase FOTO to at least 50 to demonstrate significant improvement in function at home and work related to back pain  Baseline: 09/26/22: 40 Goal status: INITIAL  2.  Pt will decrease worst back pain by at least 2 points on the NPRS in order to demonstrate clinically significant reduction in back pain. Baseline: 09/26/22: Pain 10/10 at worst Goal status: INITIAL  3.   Patient will have thoracolumbar AROM up to 75% or greater for all motions without reproduction of pain as needed for reaching items on ground, household chores, bending.     Baseline: 09/26/22: Motion loss in all planes, pain with flexion/extension and bilateral lateral flexion.  Goal status: INITIAL  4.  Pt will complete simulated lifting task with moving weighted items from table to adjacent shelf at shoulder/chest level indicative of improved ability to move household objects to complete chores Baseline: 09/26/22: Pain with lifting even light-to-moderate weights intermittently.  Goal status: INITIAL   PLAN: PT FREQUENCY: 1-2x/week  PT DURATION: 6 weeks  PLANNED INTERVENTIONS: Therapeutic exercises, Therapeutic activity, Neuromuscular re-education, Balance training, Gait training, Patient/Family education, Self Care, Joint mobilization, Joint manipulation, Vestibular training, Canalith repositioning, Orthotic/Fit training, DME instructions, Dry Needling, Electrical stimulation, Spinal manipulation, Spinal mobilization, Cryotherapy, Moist heat, Taping, Traction, Ultrasound, Ionotophoresis 4mg /ml Dexamethasone, Manual therapy, and Re-evaluation.  PLAN FOR NEXT SESSION: Manual therapy for soft tissue sensitivity of lumbar paraspinals and gluteal mm. Progress with graded motion and isometrics for analgesic effect as tolerated. Update HEP as needed for successive visits.     Consuela Mimes, PT, DPT #[id number]  Gertie Exon, PT 10/08/2022, 9:06 AM

## 2022-10-09 DIAGNOSIS — M1990 Unspecified osteoarthritis, unspecified site: Principal | ICD-10-CM

## 2022-10-09 MED ORDER — MELOXICAM 7.5 MG TABLET
ORAL_TABLET | Freq: Every day | ORAL | 1 refills | 90 days | Status: CP
Start: 2022-10-09 — End: 2023-10-09

## 2022-10-09 NOTE — Unmapped (Signed)
Patient is requesting the following refill  Requested Prescriptions     Pending Prescriptions Disp Refills    meloxicam (MOBIC) 7.5 MG tablet [Pharmacy Med Name: Meloxicam 7.5 MG Oral Tablet] 90 tablet 1     Sig: Take 1 tablet (7.5 mg total) by mouth daily.       Recent Visits  Date Type Provider Dept   08/16/22 Office Visit Jenell Milliner, MD [place] [facility] [address] [facility]   07/11/22 Office Visit Johnn Hai, Loleta [name], FNP [place] [facility] [address] [facility]   06/14/22 Office Visit Jenell Milliner, MD [place] [facility] [address] [facility]   05/18/22 Office Visit Nile Dear, Emogene Morgan, NP [place] [facility] [address] [facility]   03/22/22 Office Visit Johnn Hai, Loleta [name], FNP [place] [facility] [address] [facility]   03/19/22 Office Visit Mangel, [name], DO [place] [facility] [address] [facility]   03/06/22 Office Visit Jenell Milliner, MD [place] [facility] [address] At [facility]   12/21/21 Office Visit Deneise Lever, FNP [place] [facility] [address] At [facility]   10/31/21 Office Visit Jenell Milliner, MD [place] [facility] [address] [facility]   10/16/21 Office Visit Mangel, [name], DO [place] [facility] [address] At [facility]   Showing recent visits within past 365 days and meeting all other requirements  Future Appointments  Date Type Provider Dept   10/15/22 Appointment Mangel, [name], DO  [facility] [address] [facility]   Showing future appointments within next 365 days and meeting all other requirements       Labs: Not applicable this refill

## 2022-10-10 ENCOUNTER — Encounter: Payer: Self-pay | Admitting: Physical Therapy

## 2022-10-10 ENCOUNTER — Ambulatory Visit: Payer: Medicare HMO | Admitting: Physical Therapy

## 2022-10-10 DIAGNOSIS — M25551 Pain in right hip: Secondary | ICD-10-CM

## 2022-10-10 DIAGNOSIS — M6281 Muscle weakness (generalized): Secondary | ICD-10-CM

## 2022-10-10 DIAGNOSIS — M545 Low back pain, unspecified: Secondary | ICD-10-CM | POA: Diagnosis not present

## 2022-10-10 DIAGNOSIS — G8929 Other chronic pain: Secondary | ICD-10-CM

## 2022-10-10 NOTE — Therapy (Signed)
OUTPATIENT PHYSICAL THERAPY TREATMENT   Patient Name: Jacqueline Arroyo MRN: [id number] DOB:March 17, 1940, 82 y.o., female Today's Date: 10/10/2022  END OF SESSION:  PT End of Session - 10/10/22 0854     Visit Number 5    Number of Visits 13    Date for PT Re-Evaluation 11/08/22    Authorization Type [employer]    PT Start Time [phone]    PT Stop Time 0940    PT Time Calculation (min) 45 min    Equipment Utilized During Treatment --   pt utilizes personal SPC to negotiate office   Activity Tolerance Patient limited by pain;Patient tolerated treatment well    Behavior During Therapy [facility] for tasks assessed/performed               Past Medical History:  Diagnosis Date   Diabetes mellitus without complication (HCC)    Hypertension    Past Surgical History:  Procedure Laterality Date   ABDOMINAL HYSTERECTOMY     BACK SURGERY     CHOLECYSTECTOMY     FOOT SURGERY     There are no problems to display for this patient.   PCP: [facility]  REFERRING PROVIDER: Carnella Guadalajara, DO  REFERRING DIAG:  M54.50 (ICD-10-CM) - Low back pain, unspecified  G89.29 (ICD-10-CM) - Other chronic pain    RATIONALE FOR EVALUATION AND TREATMENT: Rehabilitation  THERAPY DIAG: Chronic bilateral low back pain without sciatica  Bilateral hip pain  Muscle weakness (generalized)  ONSET DATE: 07/15/22 (most recent flare-up resulting in visit to [facility] ED)  FOLLOW-UP APPT SCHEDULED WITH REFERRING PROVIDER: None currently on schedule  PERTINENT HISTORY: Patient is an 82 year old female referred for chronic low back pain; 20+ years of pain with prior episodes of PT and lumbar spine injections. Pt reports being shaky in the morning and having intermittent tremor. Hx of kyphoplasty in midback per patient for compression fracture. Patient reports pain goes from one hip to the other. Pt reports pain down into R thigh. Back symptoms described as soreness at rest. Pt had to use Tramadol for  significant breakthrough pain. No recent sciatica. Some sensation of "swelling" in R lateral thigh. 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   PAIN:    Pain Intensity: Present: 4-5/10, Best: 4/10, Worst: 10/10 Pain location: Across waist and bilateral gluteal region Pain Quality:  sore/tender   Radiating: Yes , to bilat glutes, bilateral flank region  Numbness/Tingling: Yes; seldom numbness in L shin  Focal Weakness: No Aggravating factors: Lifting (sometimes even bottle of water), lying on R side/sometimes on L Relieving factors: Meloxicam/Bengay/Tylenol; Tramadol prn; lying on back and stretching out in bed; lying down; sitting  24-hour pain behavior: sometimes worse in AM, sometimes bad all day long   History of prior back injury, pain, surgery, or therapy: Yes; prior PT, injections, chronic back pain   Imaging: Yes ;  EXAM: XR LUMBAR SPINE AP LATERAL AND OBLIQUES DATE: 06/25/2022 1:14 PM ACCESSION: [id number] UN DICTATED: 06/25/2022 1:16 PM INTERPRETATION LOCATION: [facility]  CLINICAL INDICATION: 82 years old Female with lower back pain    COMPARISON: CT abdomen pelvis 03/15/2022.  TECHNIQUE: AP and oblique views of the lumbar spine. A lateral view is not provided  FINDINGS: No acute fractures. Sequela of T11 and T12 vertebral augmentation.  Dextrocurvature of the lumbar spine. No anterior or retrolisthesis on oblique views. Disc narrowing, endplate sclerosis, and osteophytosis greatest at L4-L5 and L5-S1. Facet joint arthropathy greatest at L4-L5 and L5-S1.  Vascular calcifications.  Red flags: Negative for bowel/bladder changes, saddle paresthesia, personal history of cancer, h/o spinal tumors, h/o compression fx, h/o abdominal aneurysm, abdominal pain, chills/fever, night sweats, nausea, vomiting, unrelenting pain, first onset of insidious LBP <20 y/o  PRECAUTIONS: None  WEIGHT BEARING RESTRICTIONS: No  FALLS: Has patient fallen in last 6 months?  No  Living Environment Lives with: lives alone Lives in: Apartment Stairs: No, floor-level apartment  Has following equipment at home: Single point cane and Environmental consultant - 4 wheeled  Prior level of function: Independent with community mobility with device  Hobbies: Walking exercise   Patient Goals:  "Get out of pain"    OBJECTIVE:   Gross Musculoskeletal Assessment Tremor: None Bulk: Normal Tone: Normal No visible step-off along spinal column Dextrocurvature of the lumbar spine   GAIT: Distance walked: 40 ft  Assistive device utilized: Single point cane Level of assistance: Modified independence Comments: Dec stance time, ipslateral sidebend with each step, significant calcaneal valgus R>L and dynamic genu valgum; pes planus   Posture: Lumbar lordosis: WNL Increased thoracic kyphosis, rounded shoulders Iliac crest height: Equal bilaterally Lumbar lateral shift: Negative   AROM AROM (Normal range in degrees) AROM  09/26/22  Lumbar   Flexion (65) 75%*  Extension (30) 50%*  Right lateral flexion (25) 75%* (pain L flank)  Left lateral flexion (25) 75* (mild pain L glute)  Right rotation (30) 50%  Left rotation (30) 50%      Hip Right Left  Flexion (125) WNL WNL  Extension (15)    Abduction (40) 40 ("pull") 40  Adduction     Internal Rotation (45) 15 15  External Rotation (45) WNL WNL      (* = pain; Blank rows = not tested)     LE MMT: MMT (out of 5) Right 10/01/22 Left 10/01/22  Hip flexion 4- 4+  Hip extension    Hip abduction (seated) 5 5  Hip adduction (seated) 5 5  Hip internal rotation    Hip external rotation    Knee flexion 4+ 4+  Knee extension 4+ 5  Ankle dorsiflexion 4 5  Ankle plantarflexion    Ankle inversion    Ankle eversion    (* = pain; Blank rows = not tested)  Sensation Grossly intact to light touch throughout bilateral LEs as determined by testing dermatomes L2-S2. Proprioception, stereognosis, and hot/cold testing deferred on  this date.  Reflexes R/L Knee Jerk (L3/4): 2+/2+  Ankle Jerk (S1/2): Unable to obtain  Muscle Length Hamstrings: R: Positive L: Positive Ely (quadriceps): R: Not examined L: Not examined   Palpation Location Right Left         Lumbar paraspinals 2 2  Quadratus Lumborum    Gluteus Maximus 1 1  Gluteus Medius 1 1  Deep hip external rotators    PSIS 1 1  Fortin's Area (SIJ)    Greater Trochanter    (Blank rows = not tested) Graded on 0-4 scale (0 = no pain, 1 = pain, 2 = pain with wincing/grimacing/flinching, 3 = pain with withdrawal, 4 = unwilling to allow palpation)  Passive Accessory Intervertebral Motion Pt has reproduction of back pain with CPA L1-L5. Generally, hypomobile throughout mid to lower lumbar spine.   Special Tests Lumbar Radiculopathy and Discogenic: Centralization and Peripheralization (SN 92, -LR 0.12): Not examined Slump (SN 83, -LR 0.32): R: Negative L: Negative SLR (SN 92, -LR 0.29): R: Negative L:  Negative  Facet Joint: Extension-Rotation (SN 100, -LR 0.0): R: Positive L: Positive  Lumbar  Foraminal Stenosis: Lumbar quadrant (SN 70): R: Negative L: Negative  Hip: FABER (SN 81): R: Positive L: Positive Hip scour (SN 50): R: Negative L: Negative      TODAY'S TREATMENT: DATE: 10/10/2022    SUBJECTIVE STATEMENT:   Patient reports low-level pain at arrival to PT. Patient reports tolerating last visit and HEP well. Patient reports that her low back will likely have chronic/ongoing issues. 6/10 NPRS at arrival.      Manual Therapy - for symptom modulation, soft tissue sensitivity and mobility, joint mobility, ROM   STM and IASTM with Hypervolt along R>L L2-S1 erector spinae and R>L gluteal mm; x 15 minutes     *not today* MHP (unbilled) utilized prior to and during manual therapy for analgesic effect and improved soft tissue extensibility; x 5 minutes  Pain with CPA L3-5, held on continued mobilization General traction in hooklying:  Negative for pain relief     Therapeutic Exercise - for improved soft tissue flexibility and extensibility as needed for ROM, improved strength as needed to improve performance of CKC activities/functional movements   NuStep; Level 3, x 5 minutes - for improved soft tissue mobility and increased tissue temperature to improve muscle performance   -subjective gathered during this time  Lower trunk rotation; 1x10 alternating R/L, 2 sec hold  Piriformis stretch, bilat LE today; x20; 1 sec hold  PPT with Bridge, Blue Tband superior knees; 2x6  -intermittent muscle cramping in hamstrings; exercise stopped momentarily prn  3-way rollout with Blue physioball, in sitting; x5 ea dir   Seated pelvic tilt; 2x10  PATIENT EDUCATION: HEP update and review. We discussed expected progression of PT.    *not today* Hip ABD isometrics (belt fastened above knees); x10, 5 sec -for analgesic effect and graded loading to improve tolerance of loading affected tissues  Posterior pelvic tilt; 2x10  -PT demo and cueing for technique Bow and arrow open book; 1x10 on each side    PATIENT EDUCATION:  Education details: see above for patient education details Person educated: Patient Education method: Explanation, Demonstration, and Handouts Education comprehension: verbalized understanding and returned demonstration   HOME EXERCISE PROGRAM:  Access Code: [email or web address] URL: [email or web address] Date: 10/03/2022 Prepared by: Consuela Mimes  Exercises - Supine Lower Trunk Rotation  - 2 x daily - 7 x weekly - 2 sets - 10 reps - 1-2sec hold - Supine Piriformis Stretch with Foot on Ground  - 2 x daily - 7 x weekly - 2 sets - 10 reps - 1-2sec hold - Sidelying Bow and Arrow Stretch  - 2 x daily - 7 x weekly - 2 sets - 10 reps - 2-3 sec hold - Supine Posterior Pelvic Tilt  - 2 x daily - 7 x weekly - 2 sets - 10 reps - Supine Hamstring Stretch  - 2 x daily - 7 x weekly - 2 sets - 10 reps - 1-2sec  hold   ASSESSMENT:  CLINICAL IMPRESSION: NPRS is down by 2 points, 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 Patient has NPRS increased by 1 point after exercise today. Pt has most sensitivity along R lower lumbar paraspinals and R>L gluteal musculature. No recent report of referred LE or thigh pain. We progressed with gluteal and trunk strengthening today - pt has intermittent hamstrings cramping, but she otherwise tolerates exercises well. Pt has remaining deficits in: decreased thoracolumbar AROM, postural changes, decreased strength, gait changes, taut/tender bilateral lumbar paraspinals and gluteal mm. Pt will continue to benefit from skilled PT services to  address deficits and improve function.   OBJECTIVE IMPAIRMENTS: Abnormal gait, decreased balance, decreased mobility, difficulty walking, decreased ROM, decreased strength, hypomobility, impaired flexibility, postural dysfunction, and pain.   ACTIVITY LIMITATIONS: carrying, lifting, bending, sleeping, transfers, and bed mobility  PARTICIPATION LIMITATIONS: meal prep, cleaning, laundry, driving, and community activity  PERSONAL FACTORS: Age, Past/current experiences, Time since onset of injury/illness/exacerbation, and 1-2 comorbidities: HTN, Type 2 DM  are also affecting patient's functional outcome.   REHAB POTENTIAL: Good  CLINICAL DECISION MAKING: Evolving/moderate complexity  EVALUATION COMPLEXITY: Moderate   GOALS: Goals reviewed with patient? Yes  SHORT TERM GOALS: Target date: 10/19/2022  Pt will be independent with HEP in order to improve strength and decrease back pain to improve pain-free function at home and work. Baseline: 09/26/22: Baseline HEP initiated.  Goal status: INITIAL   LONG TERM GOALS: Target date: 11/09/2022  Pt will increase FOTO to at least 50 to demonstrate significant improvement in function at home and work related to back pain  Baseline: 09/26/22: 40 Goal status: INITIAL  2.  Pt will  decrease worst back pain by at least 2 points on the NPRS in order to demonstrate clinically significant reduction in back pain. Baseline: 09/26/22: Pain 10/10 at worst Goal status: INITIAL  3.  Patient will have thoracolumbar AROM up to 75% or greater for all motions without reproduction of pain as needed for reaching items on ground, household chores, bending.     Baseline: 09/26/22: Motion loss in all planes, pain with flexion/extension and bilateral lateral flexion.  Goal status: INITIAL  4.  Pt will complete simulated lifting task with moving weighted items from table to adjacent shelf at shoulder/chest level indicative of improved ability to move household objects to complete chores Baseline: 09/26/22: Pain with lifting even light-to-moderate weights intermittently.  Goal status: INITIAL   PLAN: PT FREQUENCY: 1-2x/week  PT DURATION: 6 weeks  PLANNED INTERVENTIONS: Therapeutic exercises, Therapeutic activity, Neuromuscular re-education, Balance training, Gait training, Patient/Family education, Self Care, Joint mobilization, Joint manipulation, Vestibular training, Canalith repositioning, Orthotic/Fit training, DME instructions, Dry Needling, Electrical stimulation, Spinal manipulation, Spinal mobilization, Cryotherapy, Moist heat, Taping, Traction, Ultrasound, Ionotophoresis 4mg /ml Dexamethasone, Manual therapy, and Re-evaluation.  PLAN FOR NEXT SESSION: Manual therapy for soft tissue sensitivity of lumbar paraspinals and gluteal mm. Progress with graded motion and isometrics for analgesic effect as tolerated. Update HEP as needed for successive visits.     Consuela Mimes, PT, DPT #[id number]  Gertie Exon, PT 10/10/2022, 9:48 AM

## 2022-10-15 ENCOUNTER — Ambulatory Visit
Admit: 2022-10-15 | Discharge: 2022-10-16 | Payer: MEDICARE | Attending: Student in an Organized Health Care Education/Training Program | Primary: Student in an Organized Health Care Education/Training Program

## 2022-10-15 ENCOUNTER — Encounter: Payer: Self-pay | Admitting: Physical Therapy

## 2022-10-15 ENCOUNTER — Ambulatory Visit: Payer: Medicare HMO | Admitting: Physical Therapy

## 2022-10-15 DIAGNOSIS — M545 Low back pain, unspecified: Secondary | ICD-10-CM

## 2022-10-15 DIAGNOSIS — M25552 Pain in left hip: Secondary | ICD-10-CM

## 2022-10-15 DIAGNOSIS — M6281 Muscle weakness (generalized): Secondary | ICD-10-CM

## 2022-10-15 DIAGNOSIS — R799 Abnormal finding of blood chemistry, unspecified: Principal | ICD-10-CM

## 2022-10-15 DIAGNOSIS — Z23 Encounter for immunization: Principal | ICD-10-CM

## 2022-10-15 DIAGNOSIS — F419 Anxiety disorder, unspecified: Principal | ICD-10-CM

## 2022-10-15 DIAGNOSIS — C4499 Other specified malignant neoplasm of skin, unspecified: Principal | ICD-10-CM

## 2022-10-15 DIAGNOSIS — I2510 Atherosclerotic heart disease of native coronary artery without angina pectoris: Principal | ICD-10-CM

## 2022-10-15 DIAGNOSIS — I10 Essential (primary) hypertension: Principal | ICD-10-CM

## 2022-10-15 DIAGNOSIS — E785 Hyperlipidemia, unspecified: Principal | ICD-10-CM

## 2022-10-15 DIAGNOSIS — C884 Extranodal marginal zone B-cell lymphoma: Principal | ICD-10-CM

## 2022-10-15 DIAGNOSIS — E118 Type 2 diabetes mellitus with unspecified complications: Principal | ICD-10-CM

## 2022-10-15 DIAGNOSIS — I428 Other cardiomyopathies: Principal | ICD-10-CM

## 2022-10-15 DIAGNOSIS — D751 Secondary polycythemia: Principal | ICD-10-CM

## 2022-10-15 LAB — ALBUMIN / CREATININE URINE RATIO
ALBUMIN QUANT URINE: <0.3 mg/dL
CREATININE, URINE: 70.7 mg/dL

## 2022-10-15 LAB — HEMOGLOBIN A1C
ESTIMATED AVERAGE GLUCOSE: 146 mg/dL
HEMOGLOBIN A1C: 6.7 % — ABNORMAL HIGH (ref 4.8–5.6)

## 2022-10-15 LAB — FERRITIN: FERRITIN: 328.8 ng/mL — ABNORMAL HIGH

## 2022-10-15 NOTE — Unmapped (Signed)
Assessment and Plan:     -----  82 yo female presents for chronic care follow up   -She was last seen on 06/14/2022    Hypertension, Non-ischemic cardiomypathy, CAD, HLD  BP Readings from Last 3 Encounters:   10/15/22 140/70   10/02/22 166/71   09/18/22 154/67   -Following with Cardiology, Last visit on 10/02/2022  -Medications include  -ACEi/ARB/ARNI: Lisinopril 30mg  every day   -BB: Metoprolol 25mg -50mg  at bedtime   -Diuretics: Lasix 20mg  twice a week  -Spirolactone? Yes taking 25mg  every day   -Additional Anti-hypertensive: Amlodipine 10mg  every day   -Lipid Lowering agents: Atorvastatin 10mg  every day, Fenoribrate 145mg  every day   -Anti-platelet therapy: Aspirin 81mg  every day  -Cardiology visit on 03/05/2023    Extranodal Marginal Zone B-Cell Lymphoma, Thrombocytopenia, erythrocytosis  -Noted on previous PCP visit on 06/14/2022: she was seen by [facility] heme onc in  02/2022 and encounter details reviewed with the patient today. CBC done at that time with H/H of 18/57 with normal white blood cell count, platelet count and MCV.  Iron and ferritin level were low normal at that time. She was advised to stop iron supplement . She is on Vit B 12 supplement. She was started on Hydrea 500 mg M, W, F and 1000 mg on all other days.  She is had her first therapeutic phlebotomy in 03/2022.   -Following with Hematology/Oncology, last visit 09/18/2022:   ---Ms Georgiadis is an 82 y.o. w/ a h/o erythrocytosis.  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  A bone marrow biopsy is not needed to make the diagnosis though it may offer additional prognostic information.  She also has an IDH2 and TET2 mutation, conveying worse prognosis.    ---Overall Ms. Threat is tolerating hydrea well.  Her last dose reduction has put her into the hematologic sweet spot.  We can make her treatment a bit easier by going with two tablets a day on Mon - Thur and one tablet a day on Fri to Sun.     ---PLAN: 1) Return to clinic in three months.   2) Continue on Hydrea 500 mg.   1 tablet BID on Mon - Thur  1 tablet every day on Fri - Sun  3) Continue low dose aspirin  -Checking Ferritin and encouraged to follow up with Hematology/Oncology    Type 2 DM  -Previous A1C was 7.4% (06/14/22)  -She is currently taking Metformin 500mg  in the AM and 250mg  in the PM  -Foot exam performed, no neuropathy on today's exam   -Will obtain A1C as well as Urine Microalbumin. Given her age, A1C goal 7.0-8.0%  -Will encourage to have diabetic eye exam  -     Albumin/creatinine urine ratio  -     Hemoglobin A1c    Vertigo, Essential Tremor, Insomnia  -She is not having vertigo symptoms at this time. However the essential tremor is present on exam. Previously tried Propranolol but caused sedation and stopped.   -She is taking Lorazepam for Insomnia  -Continue to monitor for now. PDMP reviewed and refill provided on Lorazepam  -     LORazepam (ATIVAN) 0.5 MG tablet; TAKE 1/2 (ONE-HALF) TABLET BY MOUTH TWICE DAILY AS NEEDED FOR ANXIETY    Skin carcinoma   -Previously diagnosed with BCC  -Continue to follow with Dermatology    OA, chronic bilateral low back pain without sciatica   -She  does endorse worsening OA and pain. She has not had recent falls or red flag signs of bowel/bladder incontinence  -She is prescribed Meloxicam 7.5mg  every day. Would advise caution with NSAIDs given cardiac symptoms and GI ADE  -PDMP was reviewed: we will prescribe Tramadol 25mg  PRN for severe pain.   Patient verbalized that she will NOT take her Lorazepam when she takes this medication.   -We discussed concern for medication and increased risk of falls and polypharmacy  Encouraged to follow with Physical Therapy  -     traMADol (ULTRAM) 50 mg tablet; Take 0.5 tablets (25 mg total) by mouth daily as needed for pain.    HEALTH MAINTENANCE  -Shingles vaccine Rx printed and provided to patient  -     varicella-zoster [id number], PF, (SHINGRIX, PF,) 50 mcg/0.5 mL SusR injection; Inject 0.5 mL into the muscle once for 1 dose. Repeat in 2-6 months  -Asthma: Prescribed Albuterol PRN  -Hx of Constipation: Continue OTC---Metamucil, Miralax, Citrucel  -Atrophic Vaginitis: To use Topical Estrace Vaginal cream  -Mammogram Due 03/2023  -DEXA on 03/13/2022: Osteopenia. Repeat in 2026    Chronic care follow up in 4 months    Subjective:     Dyneisha Culliver Hitch 82 y.o.female   is being seen for chronic care follow up     No LMP recorded (lmp unknown). Patient has had a hysterectomy.    CONCERNS  -She would like tramadol for pain, she notes that she will take half a tramadol for pain. When she takes tramadol she WILL NOT take her anxiety medication   -She would like the COVID shot   -She was treated with Hydrea for 6-7 months  -She notes that Oxycodone makes her sick   -she does wear a brace on her left ankle.     Next eye exam is in jan 2025.       ROS:     ROS negative unless otherwise noted in HPI    Denies Fever, Chills, Denies Chest pain, Denies Palpitations  Denies N/V/D, deniees blood    Vital Signs:     Wt Readings from Last 3 Encounters:   10/15/22 78.9 kg (174 lb)   10/02/22 79.9 kg (176 lb 1.6 oz)   09/18/22 79.7 kg (175 lb 11.3 oz)     Temp Readings from Last 3 Encounters:   10/15/22 36.5 ??C (97.7 ??F) (Temporal)   09/18/22 36.4 ??C (97.6 ??F) (Temporal)   08/16/22 36 ??C (96.8 ??F)     BP Readings from Last 3 Encounters:   10/15/22 140/70   10/02/22 166/71   09/18/22 154/67     Pulse Readings from Last 3 Encounters:   10/15/22 73   10/02/22 76   09/18/22 93       Objective:     General Appearance: Alert, cooperative, no distress  Head and Neck: Normocephalic, atraumatic.   EENT: EOMI. Conjuntiva clear  Cardiovascular:  Regular rate and rhythm. Murmur is present  Respiratory: Lungs clear to auscultation bilaterally,  Respiratory effort unremarkable. No wheezes or rhonchi.   Musculoskeletal: Gait is stable with use of a cane.  Extremities :Trace edema present  Neurologic: Alert and oriented x3. CN II-XII grossly intact. Head tremor present  Psychiatric: Mood and affect appropriate for situation.         Foot Exam Performed: Brief Foot Exam (Monofilament)   Monofilament Test: 8 of 8 sites normal  Skin: Normal  Nails: (!) Other (Comment) (Nail polish)  Foot Deformities: None

## 2022-10-15 NOTE — Therapy (Signed)
OUTPATIENT PHYSICAL THERAPY NOTE/ARRIVED NO-CHARGE   Patient Name: Jacqueline Arroyo MRN: [id number] DOB:Nov 10, 1940, 82 y.o., female Today's Date: 10/15/2022  END OF SESSION:  PT End of Session - 10/15/22 1335     Visit Number 6    Number of Visits 13    Date for PT Re-Evaluation 11/08/22    Authorization Type [employer]    PT Start Time 1335    PT Stop Time 1415    PT Time Calculation (min) 40 min    Equipment Utilized During Treatment --   pt utilizes personal SPC to negotiate office   Activity Tolerance Patient limited by pain;Patient tolerated treatment well    Behavior During Therapy [facility] for tasks assessed/performed                Past Medical History:  Diagnosis Date   Diabetes mellitus without complication (HCC)    Hypertension    Past Surgical History:  Procedure Laterality Date   ABDOMINAL HYSTERECTOMY     BACK SURGERY     CHOLECYSTECTOMY     FOOT SURGERY     There are no problems to display for this patient.   PCP: [facility]  REFERRING PROVIDER: Carnella Guadalajara, DO  REFERRING DIAG:  M54.50 (ICD-10-CM) - Low back pain, unspecified  G89.29 (ICD-10-CM) - Other chronic pain    RATIONALE FOR EVALUATION AND TREATMENT: Rehabilitation  THERAPY DIAG: Chronic bilateral low back pain without sciatica  Bilateral hip pain  Muscle weakness (generalized)  ONSET DATE: 07/15/22 (most recent flare-up resulting in visit to [facility] ED)  FOLLOW-UP APPT SCHEDULED WITH REFERRING PROVIDER: None currently on schedule  PERTINENT HISTORY: Patient is an 82 year old female referred for chronic low back pain; 20+ years of pain with prior episodes of PT and lumbar spine injections. Pt reports being shaky in the morning and having intermittent tremor. Hx of kyphoplasty in midback per patient for compression fracture. Patient reports pain goes from one hip to the other. Pt reports pain down into R thigh. Back symptoms described as soreness at rest. Pt had to use  Tramadol for significant breakthrough pain. No recent sciatica. Some sensation of "swelling" in R lateral thigh. 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   PAIN:    Pain Intensity: Present: 4-5/10, Best: 4/10, Worst: 10/10 Pain location: Across waist and bilateral gluteal region Pain Quality:  sore/tender   Radiating: Yes , to bilat glutes, bilateral flank region  Numbness/Tingling: Yes; seldom numbness in L shin  Focal Weakness: No Aggravating factors: Lifting (sometimes even bottle of water), lying on R side/sometimes on L Relieving factors: Meloxicam/Bengay/Tylenol; Tramadol prn; lying on back and stretching out in bed; lying down; sitting  24-hour pain behavior: sometimes worse in AM, sometimes bad all day long   History of prior back injury, pain, surgery, or therapy: Yes; prior PT, injections, chronic back pain   Imaging: Yes ;  EXAM: XR LUMBAR SPINE AP LATERAL AND OBLIQUES DATE: 06/25/2022 1:14 PM ACCESSION: [id number] UN DICTATED: 06/25/2022 1:16 PM INTERPRETATION LOCATION: [facility]  CLINICAL INDICATION: 82 years old Female with lower back pain    COMPARISON: CT abdomen pelvis 03/15/2022.  TECHNIQUE: AP and oblique views of the lumbar spine. A lateral view is not provided  FINDINGS: No acute fractures. Sequela of T11 and T12 vertebral augmentation.  Dextrocurvature of the lumbar spine. No anterior or retrolisthesis on oblique views. Disc narrowing, endplate sclerosis, and osteophytosis greatest at L4-L5 and L5-S1. Facet joint arthropathy greatest at L4-L5 and L5-S1.  Vascular  calcifications.    Red flags: Negative for bowel/bladder changes, saddle paresthesia, personal history of cancer, h/o spinal tumors, h/o compression fx, h/o abdominal aneurysm, abdominal pain, chills/fever, night sweats, nausea, vomiting, unrelenting pain, first onset of insidious LBP <20 y/o  PRECAUTIONS: None  WEIGHT BEARING RESTRICTIONS: No  FALLS: Has patient fallen in last 6  months? No  Living Environment Lives with: lives alone Lives in: Apartment Stairs: No, floor-level apartment  Has following equipment at home: Single point cane and Environmental consultant - 4 wheeled  Prior level of function: Independent with community mobility with device  Hobbies: Walking exercise   Patient Goals:  "Get out of pain"    OBJECTIVE:   Gross Musculoskeletal Assessment Tremor: None Bulk: Normal Tone: Normal No visible step-off along spinal column Dextrocurvature of the lumbar spine   GAIT: Distance walked: 40 ft  Assistive device utilized: Single point cane Level of assistance: Modified independence Comments: Dec stance time, ipslateral sidebend with each step, significant calcaneal valgus R>L and dynamic genu valgum; pes planus   Posture: Lumbar lordosis: WNL Increased thoracic kyphosis, rounded shoulders Iliac crest height: Equal bilaterally Lumbar lateral shift: Negative   AROM AROM (Normal range in degrees) AROM  09/26/22  Lumbar   Flexion (65) 75%*  Extension (30) 50%*  Right lateral flexion (25) 75%* (pain L flank)  Left lateral flexion (25) 75* (mild pain L glute)  Right rotation (30) 50%  Left rotation (30) 50%      Hip Right Left  Flexion (125) WNL WNL  Extension (15)    Abduction (40) 40 ("pull") 40  Adduction     Internal Rotation (45) 15 15  External Rotation (45) WNL WNL      (* = pain; Blank rows = not tested)     LE MMT: MMT (out of 5) Right 10/01/22 Left 10/01/22  Hip flexion 4- 4+  Hip extension    Hip abduction (seated) 5 5  Hip adduction (seated) 5 5  Hip internal rotation    Hip external rotation    Knee flexion 4+ 4+  Knee extension 4+ 5  Ankle dorsiflexion 4 5  Ankle plantarflexion    Ankle inversion    Ankle eversion    (* = pain; Blank rows = not tested)  Sensation Grossly intact to light touch throughout bilateral LEs as determined by testing dermatomes L2-S2. Proprioception, stereognosis, and hot/cold testing  deferred on this date.  Reflexes R/L Knee Jerk (L3/4): 2+/2+  Ankle Jerk (S1/2): Unable to obtain  Muscle Length Hamstrings: R: Positive L: Positive [place] (quadriceps): R: Not examined L: Not examined   Palpation Location Right Left         Lumbar paraspinals 2 2  Quadratus Lumborum    Gluteus Maximus 1 1  Gluteus Medius 1 1  Deep hip external rotators    PSIS 1 1  Fortin's Area (SIJ)    Greater Trochanter    (Blank rows = not tested) Graded on 0-4 scale (0 = no pain, 1 = pain, 2 = pain with wincing/grimacing/flinching, 3 = pain with withdrawal, 4 = unwilling to allow palpation)  Passive Accessory Intervertebral Motion Pt has reproduction of back pain with CPA L1-L5. Generally, hypomobile throughout mid to lower lumbar spine.   Special Tests Lumbar Radiculopathy and Discogenic: Centralization and Peripheralization (SN 92, -LR 0.12): Not examined Slump (SN 83, -LR 0.32): R: Negative L: Negative SLR (SN 92, -LR 0.29): R: Negative L:  Negative  Facet Joint: Extension-Rotation (SN 100, -LR 0.0): R: Positive  L: Positive  Lumbar Foraminal Stenosis: Lumbar quadrant (SN 70): R: Negative L: Negative  Hip: FABER (SN 81): R: Positive L: Positive Hip scour (SN 50): R: Negative L: Negative      TODAY'S TREATMENT: DATE: 10/15/2022    SUBJECTIVE STATEMENT:   Patient reports that she may have overdone it on the bike last visit. She state she may have had more soreness last Thursday. Patient reports 5-6/10 pain across waist at arrival to PT. Patient reports no notable leg/thigh pain recently. Patient reports compliance with HEP - some soreness after HEP.     Manual Therapy - for symptom modulation, soft tissue sensitivity and mobility, joint mobility, ROM   *next visit* STM and IASTM with Hypervolt along R>L L2-S1 erector spinae and R>L gluteal mm; x 15 minutes    *not today* General traction in hooklying: Negative for pain relief  MHP (unbilled) utilized prior to and  during manual therapy for analgesic effect and improved soft tissue extensibility; x 5 minutes  Pain with CPA L3-5, held on continued mobilization   Therapeutic Exercise - for improved soft tissue flexibility and extensibility as needed for ROM, improved strength as needed to improve performance of CKC activities/functional movements   Lower trunk rotation; 1x10 alternating R/L, 2 sec hold  Piriformis stretch, bilat LE today; x20; 1 sec hold  3-way rollout with Blue physioball (anterior and anterolateral R/L), in sitting; x5 ea dir    PATIENT EDUCATION: Encouraged pt to continue with HEP and discussed expectations following exercise with DOMS.    *hold today* NuStep; Level 3, x 5 minutes - for improved soft tissue mobility and increased tissue temperature to improve muscle performance   -subjective gathered during this time   *not today* PPT with Bridge, Blue Tband superior knees; 2x6  -intermittent muscle cramping in hamstrings; exercise stopped momentarily prn Seated pelvic tilt; 2x10 Hip ABD isometrics (belt fastened above knees); x10, 5 sec -for analgesic effect and graded loading to improve tolerance of loading affected tissues  Posterior pelvic tilt; 2x10  -PT demo and cueing for technique Bow and arrow open book; 1x10 on each side    PATIENT EDUCATION:  Education details: see above for patient education details Person educated: Patient Education method: Explanation, Demonstration, and Handouts Education comprehension: verbalized understanding and returned demonstration   HOME EXERCISE PROGRAM:  Access Code: [email or web address] URL: [email or web address] Date: 10/03/2022 Prepared by: Consuela Mimes  Exercises - Supine Lower Trunk Rotation  - 2 x daily - 7 x weekly - 2 sets - 10 reps - 1-2sec hold - Supine Piriformis Stretch with Foot on Ground  - 2 x daily - 7 x weekly - 2 sets - 10 reps - 1-2sec hold - Sidelying Bow and Arrow Stretch  - 2 x daily - 7 x  weekly - 2 sets - 10 reps - 2-3 sec hold - Supine Posterior Pelvic Tilt  - 2 x daily - 7 x weekly - 2 sets - 10 reps - Supine Hamstring Stretch  - 2 x daily - 7 x weekly - 2 sets - 10 reps - 1-2sec hold   ASSESSMENT:  CLINICAL IMPRESSION: Patient reports some increased soreness with additional aerobic activity/cycling last visit. She has unexpected phone call from delivery person dropping off medical supplies at her home during visit. She has to leave to get these supplies prior to completion of treatment planned for today. No charge for this visit given minimal intervention completed. Pt has remaining deficits in: decreased thoracolumbar AROM, postural changes, decreased strength,  gait changes, taut/tender bilateral lumbar paraspinals and gluteal mm. Pt will continue to benefit from skilled PT services to address deficits and improve function.   OBJECTIVE IMPAIRMENTS: Abnormal gait, decreased balance, decreased mobility, difficulty walking, decreased ROM, decreased strength, hypomobility, impaired flexibility, postural dysfunction, and pain.   ACTIVITY LIMITATIONS: carrying, lifting, bending, sleeping, transfers, and bed mobility  PARTICIPATION LIMITATIONS: meal prep, cleaning, laundry, driving, and community activity  PERSONAL FACTORS: Age, Past/current experiences, Time since onset of injury/illness/exacerbation, and 1-2 comorbidities: HTN, Type 2 DM  are also affecting patient's functional outcome.   REHAB POTENTIAL: Good  CLINICAL DECISION MAKING: Evolving/moderate complexity  EVALUATION COMPLEXITY: Moderate   GOALS: Goals reviewed with patient? Yes  SHORT TERM GOALS: Target date: 10/19/2022  Pt will be independent with HEP in order to improve strength and decrease back pain to improve pain-free function at home and work. Baseline: 09/26/22: Baseline HEP initiated.  Goal status: INITIAL   LONG TERM GOALS: Target date: 11/09/2022  Pt will increase FOTO to at least 50 to  demonstrate significant improvement in function at home and work related to back pain  Baseline: 09/26/22: 40 Goal status: INITIAL  2.  Pt will decrease worst back pain by at least 2 points on the NPRS in order to demonstrate clinically significant reduction in back pain. Baseline: 09/26/22: Pain 10/10 at worst Goal status: INITIAL  3.  Patient will have thoracolumbar AROM up to 75% or greater for all motions without reproduction of pain as needed for reaching items on ground, household chores, bending.     Baseline: 09/26/22: Motion loss in all planes, pain with flexion/extension and bilateral lateral flexion.  Goal status: INITIAL  4.  Pt will complete simulated lifting task with moving weighted items from table to adjacent shelf at shoulder/chest level indicative of improved ability to move household objects to complete chores Baseline: 09/26/22: Pain with lifting even light-to-moderate weights intermittently.  Goal status: INITIAL   PLAN: PT FREQUENCY: 1-2x/week  PT DURATION: 6 weeks  PLANNED INTERVENTIONS: Therapeutic exercises, Therapeutic activity, Neuromuscular re-education, Balance training, Gait training, Patient/Family education, Self Care, Joint mobilization, Joint manipulation, Vestibular training, Canalith repositioning, Orthotic/Fit training, DME instructions, Dry Needling, Electrical stimulation, Spinal manipulation, Spinal mobilization, Cryotherapy, Moist heat, Taping, Traction, Ultrasound, Ionotophoresis 4mg /ml Dexamethasone, Manual therapy, and Re-evaluation.  PLAN FOR NEXT SESSION: Manual therapy for soft tissue sensitivity of lumbar paraspinals and gluteal mm. Progress with graded motion and isometrics for analgesic effect as tolerated. Update HEP as needed for successive visits.     Consuela Mimes, PT, DPT #[id number]  Gertie Exon, PT 10/15/2022, 1:36 PM

## 2022-10-17 ENCOUNTER — Ambulatory Visit: Payer: Medicare HMO | Admitting: Physical Therapy

## 2022-10-17 DIAGNOSIS — M545 Low back pain, unspecified: Secondary | ICD-10-CM

## 2022-10-17 DIAGNOSIS — M6281 Muscle weakness (generalized): Secondary | ICD-10-CM

## 2022-10-17 DIAGNOSIS — M25552 Pain in left hip: Secondary | ICD-10-CM

## 2022-10-17 DIAGNOSIS — G250 Essential tremor: Principal | ICD-10-CM

## 2022-10-17 DIAGNOSIS — G4700 Insomnia, unspecified: Principal | ICD-10-CM

## 2022-10-17 MED ORDER — LORAZEPAM 0.5 MG TABLET
ORAL_TABLET | ORAL | 0 refills | 0.00000 days | Status: CP
Start: 2022-10-17 — End: 2022-10-17

## 2022-10-17 MED ORDER — TRAMADOL 50 MG TABLET
ORAL_TABLET | Freq: Every day | ORAL | 0 refills | 30 days | Status: CP | PRN
Start: 2022-10-17 — End: 2022-11-16

## 2022-10-17 NOTE — Therapy (Signed)
OUTPATIENT PHYSICAL THERAPY TREATMENT  Patient Name: Jacqueline Arroyo MRN: [id number] DOB:[birth year], 82 y.o., female Today's Date: 10/17/2022  END OF SESSION:  PT End of Session - 10/17/22 0903     Visit Number 7    Number of Visits 13    Date for PT Re-Evaluation 11/08/22    Authorization Type [employer]    PT Start Time 0904    PT Stop Time 0944    PT Time Calculation (min) 40 min    Equipment Utilized During Treatment --   pt utilizes personal SPC to negotiate office   Activity Tolerance Patient limited by pain;Patient tolerated treatment well    Behavior During Therapy [facility] for tasks assessed/performed                 Past Medical History:  Diagnosis Date   Diabetes mellitus without complication (HCC)    Hypertension    Past Surgical History:  Procedure Laterality Date   ABDOMINAL HYSTERECTOMY     BACK SURGERY     CHOLECYSTECTOMY     FOOT SURGERY     There are no problems to display for this patient.   PCP: [facility]  REFERRING PROVIDER: Carnella Guadalajara, DO  REFERRING DIAG:  M54.50 (ICD-10-CM) - Low back pain, unspecified  G89.29 (ICD-10-CM) - Other chronic pain    RATIONALE FOR EVALUATION AND TREATMENT: Rehabilitation  THERAPY DIAG: Chronic bilateral low back pain without sciatica  Bilateral hip pain  Muscle weakness (generalized)  ONSET DATE: 07/15/22 (most recent flare-up resulting in visit to [facility] ED)  FOLLOW-UP APPT SCHEDULED WITH REFERRING PROVIDER: None currently on schedule  PERTINENT HISTORY: Patient is an 82 year old female referred for chronic low back pain; 20+ years of pain with prior episodes of PT and lumbar spine injections. Pt reports being shaky in the morning and having intermittent tremor. Hx of kyphoplasty in midback per patient for compression fracture. Patient reports pain goes from one hip to the other. Pt reports pain down into R thigh. Back symptoms described as soreness at rest. Pt had to use Tramadol for  significant breakthrough pain. No recent sciatica. Some sensation of "swelling" in R lateral thigh. 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   PAIN:    Pain Intensity: Present: 4-5/10, Best: 4/10, Worst: 10/10 Pain location: Across waist and bilateral gluteal region Pain Quality:  sore/tender   Radiating: Yes , to bilat glutes, bilateral flank region  Numbness/Tingling: Yes; seldom numbness in L shin  Focal Weakness: No Aggravating factors: Lifting (sometimes even bottle of water), lying on R side/sometimes on L Relieving factors: Meloxicam/Bengay/Tylenol; Tramadol prn; lying on back and stretching out in bed; lying down; sitting  24-hour pain behavior: sometimes worse in AM, sometimes bad all day long   History of prior back injury, pain, surgery, or therapy: Yes; prior PT, injections, chronic back pain   Imaging: Yes ;  EXAM: XR LUMBAR SPINE AP LATERAL AND OBLIQUES DATE: 06/25/2022 1:14 PM ACCESSION: [id number] UN DICTATED: 06/25/2022 1:16 PM INTERPRETATION LOCATION: [facility]  CLINICAL INDICATION: 82 years old Female with lower back pain    COMPARISON: CT abdomen pelvis 03/15/2022.  TECHNIQUE: AP and oblique views of the lumbar spine. A lateral view is not provided  FINDINGS: No acute fractures. Sequela of T11 and T12 vertebral augmentation.  Dextrocurvature of the lumbar spine. No anterior or retrolisthesis on oblique views. Disc narrowing, endplate sclerosis, and osteophytosis greatest at L4-L5 and L5-S1. Facet joint arthropathy greatest at L4-L5 and L5-S1.  Vascular calcifications.  Red flags: Negative for bowel/bladder changes, saddle paresthesia, personal history of cancer, h/o spinal tumors, h/o compression fx, h/o abdominal aneurysm, abdominal pain, chills/fever, night sweats, nausea, vomiting, unrelenting pain, first onset of insidious LBP <20 y/o  PRECAUTIONS: None  WEIGHT BEARING RESTRICTIONS: No  FALLS: Has patient fallen in last 6 months?  No  Living Environment Lives with: lives alone Lives in: Apartment Stairs: No, floor-level apartment  Has following equipment at home: Single point cane and Environmental consultant - 4 wheeled  Prior level of function: Independent with community mobility with device  Hobbies: Walking exercise   Patient Goals:  "Get out of pain"    OBJECTIVE:   Gross Musculoskeletal Assessment Tremor: None Bulk: Normal Tone: Normal No visible step-off along spinal column Dextrocurvature of the lumbar spine   GAIT: Distance walked: 40 ft  Assistive device utilized: Single point cane Level of assistance: Modified independence Comments: Dec stance time, ipslateral sidebend with each step, significant calcaneal valgus R>L and dynamic genu valgum; pes planus   Posture: Lumbar lordosis: WNL Increased thoracic kyphosis, rounded shoulders Iliac crest height: Equal bilaterally Lumbar lateral shift: Negative   AROM AROM (Normal range in degrees) AROM  09/26/22  Lumbar   Flexion (65) 75%*  Extension (30) 50%*  Right lateral flexion (25) 75%* (pain L flank)  Left lateral flexion (25) 75* (mild pain L glute)  Right rotation (30) 50%  Left rotation (30) 50%      Hip Right Left  Flexion (125) WNL WNL  Extension (15)    Abduction (40) 40 ("pull") 40  Adduction     Internal Rotation (45) 15 15  External Rotation (45) WNL WNL      (* = pain; Blank rows = not tested)     LE MMT: MMT (out of 5) Right 10/01/22 Left 10/01/22  Hip flexion 4- 4+  Hip extension    Hip abduction (seated) 5 5  Hip adduction (seated) 5 5  Hip internal rotation    Hip external rotation    Knee flexion 4+ 4+  Knee extension 4+ 5  Ankle dorsiflexion 4 5  Ankle plantarflexion    Ankle inversion    Ankle eversion    (* = pain; Blank rows = not tested)  Sensation Grossly intact to light touch throughout bilateral LEs as determined by testing dermatomes L2-S2. Proprioception, stereognosis, and hot/cold testing deferred on  this date.  Reflexes R/L Knee Jerk (L3/4): 2+/2+  Ankle Jerk (S1/2): Unable to obtain  Muscle Length Hamstrings: R: Positive L: Positive [place] (quadriceps): R: Not examined L: Not examined   Palpation Location Right Left         Lumbar paraspinals 2 2  Quadratus Lumborum    Gluteus Maximus 1 1  Gluteus Medius 1 1  Deep hip external rotators    PSIS 1 1  Fortin's Area (SIJ)    Greater Trochanter    (Blank rows = not tested) Graded on 0-4 scale (0 = no pain, 1 = pain, 2 = pain with wincing/grimacing/flinching, 3 = pain with withdrawal, 4 = unwilling to allow palpation)  Passive Accessory Intervertebral Motion Pt has reproduction of back pain with CPA L1-L5. Generally, hypomobile throughout mid to lower lumbar spine.   Special Tests Lumbar Radiculopathy and Discogenic: Centralization and Peripheralization (SN 92, -LR 0.12): Not examined Slump (SN 83, -LR 0.32): R: Negative L: Negative SLR (SN 92, -LR 0.29): R: Negative L:  Negative  Facet Joint: Extension-Rotation (SN 100, -LR 0.0): R: Positive L: Positive  Lumbar  Foraminal Stenosis: Lumbar quadrant (SN 70): R: Negative L: Negative  Hip: FABER (SN 81): R: Positive L: Positive Hip scour (SN 50): R: Negative L: Negative      TODAY'S TREATMENT: DATE: 10/17/2022    SUBJECTIVE STATEMENT:   Pt reports 6/10 pain affecting low back R>L side, R gluteal region, and R posterior thigh in particular. Patient reports compliance with her HEP.    MHP (unbilled) utilized prior to and during manual therapy for analgesic effect and improved soft tissue extensibility; x 5 minutes     Manual Therapy - for symptom modulation, soft tissue sensitivity and mobility, joint mobility, ROM   STM and IASTM with Hypervolt along R>L L2-S1 erector spinae and R>L gluteal mm, R midline posterior thigh and lateral hamstrings; x 23 minutes    *not today* General traction in hooklying: Negative for pain relief  Pain with CPA L3-5, held on  continued mobilization   Therapeutic Exercise - for improved soft tissue flexibility and extensibility as needed for ROM, improved strength as needed to improve performance of CKC activities/functional movements  3-way rollout with Blue physioball (anterior and anterolateral R/L), in sitting; x5 ea dir   Active hamstrings stretch (sciatic floss technique); x20 bilat LE  Posterior pelvic tilt; 2x10  PPT with Bridge; 2x6   PATIENT EDUCATION: Encouraged continued HEP; discussed continued POC.    *hold today* NuStep; Level 3, x 5 minutes - for improved soft tissue mobility and increased tissue temperature to improve muscle performance   -subjective gathered during this time   *not today* Piriformis stretch, bilat LE today; x20; 1 sec hold Lower trunk rotation; 1x10 alternating R/L, 2 sec hold exercise stopped momentarily prn Seated pelvic tilt; 2x10 Hip ABD isometrics (belt fastened above knees); x10, 5 sec -for analgesic effect and graded loading to improve tolerance of loading affected tissues  Posterior pelvic tilt; 2x10  -PT demo and cueing for technique Bow and arrow open book; 1x10 on each side    PATIENT EDUCATION:  Education details: see above for patient education details Person educated: Patient Education method: Explanation, Demonstration, and Handouts Education comprehension: verbalized understanding and returned demonstration   HOME EXERCISE PROGRAM:  Access Code: [email or web address] URL: [email or web address] Date: 10/03/2022 Prepared by: Consuela Mimes  Exercises - Supine Lower Trunk Rotation  - 2 x daily - 7 x weekly - 2 sets - 10 reps - 1-2sec hold - Supine Piriformis Stretch with Foot on Ground  - 2 x daily - 7 x weekly - 2 sets - 10 reps - 1-2sec hold - Sidelying Bow and Arrow Stretch  - 2 x daily - 7 x weekly - 2 sets - 10 reps - 2-3 sec hold - Supine Posterior Pelvic Tilt  - 2 x daily - 7 x weekly - 2 sets - 10 reps - Supine Hamstring Stretch   - 2 x daily - 7 x weekly - 2 sets - 10 reps - 1-2sec hold   ASSESSMENT:  CLINICAL IMPRESSION: We held on cycling due to pt's report of "overdoing it" with visit before last and having notable discomfort afterward. Pt has ongoing 6/10 pain this AM, and she does respond well with manual therapy. She has intermittent hamstrings cramping with bridging requiring modification to volume of each set, but she otherwise tolerates mobility work and other exercises well. Pt has remaining deficits in: decreased thoracolumbar AROM, postural changes, decreased strength, gait changes, taut/tender bilateral lumbar paraspinals and gluteal mm. Pt will continue to benefit from skilled PT services to address deficits  and improve function.   OBJECTIVE IMPAIRMENTS: Abnormal gait, decreased balance, decreased mobility, difficulty walking, decreased ROM, decreased strength, hypomobility, impaired flexibility, postural dysfunction, and pain.   ACTIVITY LIMITATIONS: carrying, lifting, bending, sleeping, transfers, and bed mobility  PARTICIPATION LIMITATIONS: meal prep, cleaning, laundry, driving, and community activity  PERSONAL FACTORS: Age, Past/current experiences, Time since onset of injury/illness/exacerbation, and 1-2 comorbidities: HTN, Type 2 DM  are also affecting patient's functional outcome.   REHAB POTENTIAL: Good  CLINICAL DECISION MAKING: Evolving/moderate complexity  EVALUATION COMPLEXITY: Moderate   GOALS: Goals reviewed with patient? Yes  SHORT TERM GOALS: Target date: 10/19/2022  Pt will be independent with HEP in order to improve strength and decrease back pain to improve pain-free function at home and work. Baseline: 09/26/22: Baseline HEP initiated.  Goal status: INITIAL   LONG TERM GOALS: Target date: 11/09/2022  Pt will increase FOTO to at least 50 to demonstrate significant improvement in function at home and work related to back pain  Baseline: 09/26/22: 40 Goal status:  INITIAL  2.  Pt will decrease worst back pain by at least 2 points on the NPRS in order to demonstrate clinically significant reduction in back pain. Baseline: 09/26/22: Pain 10/10 at worst Goal status: INITIAL  3.  Patient will have thoracolumbar AROM up to 75% or greater for all motions without reproduction of pain as needed for reaching items on ground, household chores, bending.     Baseline: 09/26/22: Motion loss in all planes, pain with flexion/extension and bilateral lateral flexion.  Goal status: INITIAL  4.  Pt will complete simulated lifting task with moving weighted items from table to adjacent shelf at shoulder/chest level indicative of improved ability to move household objects to complete chores Baseline: 09/26/22: Pain with lifting even light-to-moderate weights intermittently.  Goal status: INITIAL   PLAN: PT FREQUENCY: 1-2x/week  PT DURATION: 6 weeks  PLANNED INTERVENTIONS: Therapeutic exercises, Therapeutic activity, Neuromuscular re-education, Balance training, Gait training, Patient/Family education, Self Care, Joint mobilization, Joint manipulation, Vestibular training, Canalith repositioning, Orthotic/Fit training, DME instructions, Dry Needling, Electrical stimulation, Spinal manipulation, Spinal mobilization, Cryotherapy, Moist heat, Taping, Traction, Ultrasound, Ionotophoresis 4mg /ml Dexamethasone, Manual therapy, and Re-evaluation.  PLAN FOR NEXT SESSION: Manual therapy for soft tissue sensitivity of lumbar paraspinals and gluteal mm. Progress with graded motion and isometrics for analgesic effect as tolerated. Update HEP as needed for successive visits.     Consuela Mimes, PT, DPT #[id number]  Gertie Exon, PT 10/17/2022, 9:04 AM

## 2022-10-22 ENCOUNTER — Ambulatory Visit: Payer: Medicare HMO | Admitting: Physical Therapy

## 2022-10-22 DIAGNOSIS — M545 Low back pain, unspecified: Secondary | ICD-10-CM | POA: Diagnosis not present

## 2022-10-22 DIAGNOSIS — M25551 Pain in right hip: Secondary | ICD-10-CM

## 2022-10-22 DIAGNOSIS — G8929 Other chronic pain: Secondary | ICD-10-CM

## 2022-10-22 DIAGNOSIS — M6281 Muscle weakness (generalized): Secondary | ICD-10-CM

## 2022-10-22 NOTE — Therapy (Signed)
OUTPATIENT PHYSICAL THERAPY TREATMENT  Patient Name: Jacqueline Arroyo MRN: [id number] DOB:July 13, 1940, 82 y.o., female Today's Date: 10/22/2022  END OF SESSION:  PT End of Session - 10/22/22 0907     Visit Number 8    Number of Visits 13    Date for PT Re-Evaluation 11/08/22    Authorization Type [employer]    PT Start Time 0900    PT Stop Time 0946    PT Time Calculation (min) 46 min    Equipment Utilized During Treatment --   pt utilizes personal SPC to negotiate office   Activity Tolerance Patient limited by pain;Patient tolerated treatment well    Behavior During Therapy [facility] for tasks assessed/performed                  Past Medical History:  Diagnosis Date   Diabetes mellitus without complication (HCC)    Hypertension    Past Surgical History:  Procedure Laterality Date   ABDOMINAL HYSTERECTOMY     BACK SURGERY     CHOLECYSTECTOMY     FOOT SURGERY     There are no problems to display for this patient.   PCP: [facility]  REFERRING PROVIDER: Carnella Guadalajara, DO  REFERRING DIAG:  M54.50 (ICD-10-CM) - Low back pain, unspecified  G89.29 (ICD-10-CM) - Other chronic pain    RATIONALE FOR EVALUATION AND TREATMENT: Rehabilitation  THERAPY DIAG: Chronic bilateral low back pain without sciatica  Bilateral hip pain  Muscle weakness (generalized)  ONSET DATE: 07/15/22 (most recent flare-up resulting in visit to [facility] ED)  FOLLOW-UP APPT SCHEDULED WITH REFERRING PROVIDER: None currently on schedule  PERTINENT HISTORY: Patient is an 82 year old female referred for chronic low back pain; 20+ years of pain with prior episodes of PT and lumbar spine injections. Pt reports being shaky in the morning and having intermittent tremor. Hx of kyphoplasty in midback per patient for compression fracture. Patient reports pain goes from one hip to the other. Pt reports pain down into R thigh. Back symptoms described as soreness at rest. Pt had to use Tramadol for  significant breakthrough pain. No recent sciatica. Some sensation of "swelling" in R lateral thigh. 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   PAIN:    Pain Intensity: Present: 4-5/10, Best: 4/10, Worst: 10/10 Pain location: Across waist and bilateral gluteal region Pain Quality:  sore/tender   Radiating: Yes , to bilat glutes, bilateral flank region  Numbness/Tingling: Yes; seldom numbness in L shin  Focal Weakness: No Aggravating factors: Lifting (sometimes even bottle of water), lying on R side/sometimes on L Relieving factors: Meloxicam/Bengay/Tylenol; Tramadol prn; lying on back and stretching out in bed; lying down; sitting  24-hour pain behavior: sometimes worse in AM, sometimes bad all day long   History of prior back injury, pain, surgery, or therapy: Yes; prior PT, injections, chronic back pain   Imaging: Yes ;  EXAM: XR LUMBAR SPINE AP LATERAL AND OBLIQUES DATE: 06/25/2022 1:14 PM ACCESSION: [id number] UN DICTATED: 06/25/2022 1:16 PM INTERPRETATION LOCATION: [facility]  CLINICAL INDICATION: 82 years old Female with lower back pain    COMPARISON: CT abdomen pelvis 03/15/2022.  TECHNIQUE: AP and oblique views of the lumbar spine. A lateral view is not provided  FINDINGS: No acute fractures. Sequela of T11 and T12 vertebral augmentation.  Dextrocurvature of the lumbar spine. No anterior or retrolisthesis on oblique views. Disc narrowing, endplate sclerosis, and osteophytosis greatest at L4-L5 and L5-S1. Facet joint arthropathy greatest at L4-L5 and L5-S1.  Vascular  calcifications.    Red flags: Negative for bowel/bladder changes, saddle paresthesia, personal history of cancer, h/o spinal tumors, h/o compression fx, h/o abdominal aneurysm, abdominal pain, chills/fever, night sweats, nausea, vomiting, unrelenting pain, first onset of insidious LBP <20 y/o  PRECAUTIONS: None  WEIGHT BEARING RESTRICTIONS: No  FALLS: Has patient fallen in last 6 months?  No  Living Environment Lives with: lives alone Lives in: Apartment Stairs: No, floor-level apartment  Has following equipment at home: Single point cane and Environmental consultant - 4 wheeled  Prior level of function: Independent with community mobility with device  Hobbies: Walking exercise   Patient Goals:  "Get out of pain"    OBJECTIVE:   Gross Musculoskeletal Assessment Tremor: None Bulk: Normal Tone: Normal No visible step-off along spinal column Dextrocurvature of the lumbar spine   GAIT: Distance walked: 40 ft  Assistive device utilized: Single point cane Level of assistance: Modified independence Comments: Dec stance time, ipslateral sidebend with each step, significant calcaneal valgus R>L and dynamic genu valgum; pes planus   Posture: Lumbar lordosis: WNL Increased thoracic kyphosis, rounded shoulders Iliac crest height: Equal bilaterally Lumbar lateral shift: Negative   AROM AROM (Normal range in degrees) AROM  09/26/22  Lumbar   Flexion (65) 75%*  Extension (30) 50%*  Right lateral flexion (25) 75%* (pain L flank)  Left lateral flexion (25) 75* (mild pain L glute)  Right rotation (30) 50%  Left rotation (30) 50%      Hip Right Left  Flexion (125) WNL WNL  Extension (15)    Abduction (40) 40 ("pull") 40  Adduction     Internal Rotation (45) 15 15  External Rotation (45) WNL WNL      (* = pain; Blank rows = not tested)     LE MMT: MMT (out of 5) Right 10/01/22 Left 10/01/22  Hip flexion 4- 4+  Hip extension    Hip abduction (seated) 5 5  Hip adduction (seated) 5 5  Hip internal rotation    Hip external rotation    Knee flexion 4+ 4+  Knee extension 4+ 5  Ankle dorsiflexion 4 5  Ankle plantarflexion    Ankle inversion    Ankle eversion    (* = pain; Blank rows = not tested)  Sensation Grossly intact to light touch throughout bilateral LEs as determined by testing dermatomes L2-S2. Proprioception, stereognosis, and hot/cold testing deferred on  this date.  Reflexes R/L Knee Jerk (L3/4): 2+/2+  Ankle Jerk (S1/2): Unable to obtain  Muscle Length Hamstrings: R: Positive L: Positive [place] (quadriceps): R: Not examined L: Not examined   Palpation Location Right Left         Lumbar paraspinals 2 2  Quadratus Lumborum    Gluteus Maximus 1 1  Gluteus Medius 1 1  Deep hip external rotators    PSIS 1 1  Fortin's Area (SIJ)    Greater Trochanter    (Blank rows = not tested) Graded on 0-4 scale (0 = no pain, 1 = pain, 2 = pain with wincing/grimacing/flinching, 3 = pain with withdrawal, 4 = unwilling to allow palpation)  Passive Accessory Intervertebral Motion Pt has reproduction of back pain with CPA L1-L5. Generally, hypomobile throughout mid to lower lumbar spine.   Special Tests Lumbar Radiculopathy and Discogenic: Centralization and Peripheralization (SN 92, -LR 0.12): Not examined Slump (SN 83, -LR 0.32): R: Negative L: Negative SLR (SN 92, -LR 0.29): R: Negative L:  Negative  Facet Joint: Extension-Rotation (SN 100, -LR 0.0): R: Positive  L: Positive  Lumbar Foraminal Stenosis: Lumbar quadrant (SN 70): R: Negative L: Negative  Hip: FABER (SN 81): R: Positive L: Positive Hip scour (SN 50): R: Negative L: Negative      TODAY'S TREATMENT: DATE: 10/22/2022    SUBJECTIVE STATEMENT:   Pt reports 5-6/10 pain over the previous day. She took Tylenol earlier, and she reports well controlled pain after that. She reports compliance with her HEP.     Manual Therapy - for symptom modulation, soft tissue sensitivity and mobility, joint mobility, ROM   STM and IASTM with Hypervolt along R>L L2-S1 erector spinae and R>L gluteal mm; x 23 minutes    *not today* STM R midline posterior thigh and lateral hamstrings General traction in hooklying: Negative for pain relief  Pain with CPA L3-5, held on continued mobilization    Trigger Point Dry Needling (TDN), unbilled Education performed with patient regarding  potential benefit of TDN. Reviewed precautions and risks with patient. Reviewed special precautions/risks over lung fields which include pneumothorax. Reviewed signs and symptoms of pneumothorax and advised pt to go to ER immediately if these symptoms develop advise them of dry needling treatment. Extensive time spent with pt to ensure full understanding of TDN risks. Pt provided verbal consent to treatment. TDN performed to  R iliocostalis lumborum at L4 and L5 and gluteus maximus with 0.30 x 70 single needle placements with local twitch response (LTR). Pistoning technique utilized. Improved pain-free motion following intervention.     Therapeutic Exercise - for improved soft tissue flexibility and extensibility as needed for ROM, improved strength as needed to improve performance of CKC activities/functional movements   MHP (unbilled) utilized during warm-up on NuStep for analgesic effect and improved soft tissue extensibility; x 5 minutes    NuStep; Level 2, x 5 minutes - for improved soft tissue mobility and increased tissue temperature to improve muscle performance   -subjective gathered during this time  3-way rollout with Blue physioball (anterior and anterolateral R/L), in sitting; x5 ea dir   Lower trunk rotation; 1x10 alternating R/L, 2 sec hold  Posterior pelvic tilt; 2x10    PATIENT EDUCATION: Encouraged continued HEP; discussed continued POC. We discussed post-needling expectations and mild adverse effects.     *not today* PPT with Bridge; 2x6 Active hamstrings stretch (sciatic floss technique); x20 bilat LE Piriformis stretch, bilat LE today; x20; 1 sec hold exercise stopped momentarily prn Seated pelvic tilt; 2x10 Hip ABD isometrics (belt fastened above knees); x10, 5 sec -for analgesic effect and graded loading to improve tolerance of loading affected tissues  Posterior pelvic tilt; 2x10  -PT demo and cueing for technique Bow and arrow open book; 1x10 on each side     PATIENT EDUCATION:  Education details: see above for patient education details Person educated: Patient Education method: Explanation, Demonstration, and Handouts Education comprehension: verbalized understanding and returned demonstration   HOME EXERCISE PROGRAM:  Access Code: [email or web address] URL: [email or web address] Date: 10/03/2022 Prepared by: Consuela Mimes  Exercises - Supine Lower Trunk Rotation  - 2 x daily - 7 x weekly - 2 sets - 10 reps - 1-2sec hold - Supine Piriformis Stretch with Foot on Ground  - 2 x daily - 7 x weekly - 2 sets - 10 reps - 1-2sec hold - Sidelying Bow and Arrow Stretch  - 2 x daily - 7 x weekly - 2 sets - 10 reps - 2-3 sec hold - Supine Posterior Pelvic Tilt  - 2 x daily - 7 x weekly - 2 sets -  10 reps - Supine Hamstring Stretch  - 2 x daily - 7 x weekly - 2 sets - 10 reps - 1-2sec hold   ASSESSMENT:  CLINICAL IMPRESSION: We completed cycling today with low intensity while simultaneously using moist heat for analgesic effect to improve activity tolerance. Pt denies notable lower limb or thigh pain recently, though she has ongoing pain along R>L low back and gluteal region at similar pain rating scale (5-6/10). We utilized dry needling today to treat persistent pain/sensitivity affecting this region with sound twitch obtained at R iliocostalis lumborum and mild discomfort in gluteus maximus treated with needling. Pt demonstrates mild deficit with forward flexion in standing without notable pain; remaining pain with bilat rotation and L lateral flexion. Pt has remaining deficits in: decreased thoracolumbar AROM, postural changes, decreased strength, gait changes, taut/tender bilateral lumbar paraspinals and gluteal mm. Pt will continue to benefit from skilled PT services to address deficits and improve function.   OBJECTIVE IMPAIRMENTS: Abnormal gait, decreased balance, decreased mobility, difficulty walking, decreased ROM, decreased strength,  hypomobility, impaired flexibility, postural dysfunction, and pain.   ACTIVITY LIMITATIONS: carrying, lifting, bending, sleeping, transfers, and bed mobility  PARTICIPATION LIMITATIONS: meal prep, cleaning, laundry, driving, and community activity  PERSONAL FACTORS: Age, Past/current experiences, Time since onset of injury/illness/exacerbation, and 1-2 comorbidities: HTN, Type 2 DM  are also affecting patient's functional outcome.   REHAB POTENTIAL: Good  CLINICAL DECISION MAKING: Evolving/moderate complexity  EVALUATION COMPLEXITY: Moderate   GOALS: Goals reviewed with patient? Yes  SHORT TERM GOALS: Target date: 10/19/2022  Pt will be independent with HEP in order to improve strength and decrease back pain to improve pain-free function at home and work. Baseline: 09/26/22: Baseline HEP initiated.  Goal status: INITIAL   LONG TERM GOALS: Target date: 11/09/2022  Pt will increase FOTO to at least 50 to demonstrate significant improvement in function at home and work related to back pain  Baseline: 09/26/22: 40 Goal status: INITIAL  2.  Pt will decrease worst back pain by at least 2 points on the NPRS in order to demonstrate clinically significant reduction in back pain. Baseline: 09/26/22: Pain 10/10 at worst Goal status: INITIAL  3.  Patient will have thoracolumbar AROM up to 75% or greater for all motions without reproduction of pain as needed for reaching items on ground, household chores, bending.     Baseline: 09/26/22: Motion loss in all planes, pain with flexion/extension and bilateral lateral flexion.  Goal status: INITIAL  4.  Pt will complete simulated lifting task with moving weighted items from table to adjacent shelf at shoulder/chest level indicative of improved ability to move household objects to complete chores Baseline: 09/26/22: Pain with lifting even light-to-moderate weights intermittently.  Goal status: INITIAL   PLAN: PT FREQUENCY: 1-2x/week  PT  DURATION: 6 weeks  PLANNED INTERVENTIONS: Therapeutic exercises, Therapeutic activity, Neuromuscular re-education, Balance training, Gait training, Patient/Family education, Self Care, Joint mobilization, Joint manipulation, Vestibular training, Canalith repositioning, Orthotic/Fit training, DME instructions, Dry Needling, Electrical stimulation, Spinal manipulation, Spinal mobilization, Cryotherapy, Moist heat, Taping, Traction, Ultrasound, Ionotophoresis 4mg /ml Dexamethasone, Manual therapy, and Re-evaluation.  PLAN FOR NEXT SESSION: Manual therapy for soft tissue sensitivity of lumbar paraspinals and gluteal mm. Progress with graded motion and isometrics for analgesic effect as tolerated. Update HEP as needed for successive visits.     Consuela Mimes, PT, DPT #[id number]  Gertie Exon, PT 10/22/2022, 10:39 AM

## 2022-10-24 ENCOUNTER — Ambulatory Visit: Payer: Medicare HMO | Admitting: Physical Therapy

## 2022-10-24 DIAGNOSIS — M6281 Muscle weakness (generalized): Secondary | ICD-10-CM

## 2022-10-24 DIAGNOSIS — M545 Low back pain, unspecified: Secondary | ICD-10-CM | POA: Diagnosis not present

## 2022-10-24 DIAGNOSIS — G8929 Other chronic pain: Secondary | ICD-10-CM

## 2022-10-24 DIAGNOSIS — M25551 Pain in right hip: Secondary | ICD-10-CM

## 2022-10-24 NOTE — Therapy (Signed)
OUTPATIENT PHYSICAL THERAPY TREATMENT  Patient Name: Jacqueline Arroyo MRN: [id number] DOB:Nov 28, 1940, 82 y.o., female Today's Date: 10/24/2022  END OF SESSION:  PT End of Session - 10/24/22 0910     Visit Number 9    Number of Visits 13    Date for PT Re-Evaluation 11/08/22    Authorization Type [employer]    PT Start Time 0903    PT Stop Time 0945    PT Time Calculation (min) 42 min    Equipment Utilized During Treatment --   pt utilizes personal SPC to negotiate office   Activity Tolerance Patient limited by pain;Patient tolerated treatment well    Behavior During Therapy [facility] for tasks assessed/performed                   Past Medical History:  Diagnosis Date   Diabetes mellitus without complication (HCC)    Hypertension    Past Surgical History:  Procedure Laterality Date   ABDOMINAL HYSTERECTOMY     BACK SURGERY     CHOLECYSTECTOMY     FOOT SURGERY     There are no problems to display for this patient.   PCP: [facility]  REFERRING PROVIDER: Carnella Guadalajara, DO  REFERRING DIAG:  M54.50 (ICD-10-CM) - Low back pain, unspecified  G89.29 (ICD-10-CM) - Other chronic pain    RATIONALE FOR EVALUATION AND TREATMENT: Rehabilitation  THERAPY DIAG: Chronic bilateral low back pain without sciatica  Bilateral hip pain  Muscle weakness (generalized)  ONSET DATE: 07/15/22 (most recent flare-up resulting in visit to [facility] ED)  FOLLOW-UP APPT SCHEDULED WITH REFERRING PROVIDER: None currently on schedule  PERTINENT HISTORY: Patient is an 82 year old female referred for chronic low back pain; 20+ years of pain with prior episodes of PT and lumbar spine injections. Pt reports being shaky in the morning and having intermittent tremor. Hx of kyphoplasty in midback per patient for compression fracture. Patient reports pain goes from one hip to the other. Pt reports pain down into R thigh. Back symptoms described as soreness at rest. Pt had to use Tramadol for  significant breakthrough pain. No recent sciatica. Some sensation of "swelling" in R lateral thigh. 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   PAIN:    Pain Intensity: Present: 4-5/10, Best: 4/10, Worst: 10/10 Pain location: Across waist and bilateral gluteal region Pain Quality:  sore/tender   Radiating: Yes , to bilat glutes, bilateral flank region  Numbness/Tingling: Yes; seldom numbness in L shin  Focal Weakness: No Aggravating factors: Lifting (sometimes even bottle of water), lying on R side/sometimes on L Relieving factors: Meloxicam/Bengay/Tylenol; Tramadol prn; lying on back and stretching out in bed; lying down; sitting  24-hour pain behavior: sometimes worse in AM, sometimes bad all day long   History of prior back injury, pain, surgery, or therapy: Yes; prior PT, injections, chronic back pain   Imaging: Yes ;  EXAM: XR LUMBAR SPINE AP LATERAL AND OBLIQUES DATE: 06/25/2022 1:14 PM ACCESSION: [id number] UN DICTATED: 06/25/2022 1:16 PM INTERPRETATION LOCATION: [facility]  CLINICAL INDICATION: 82 years old Female with lower back pain    COMPARISON: CT abdomen pelvis 03/15/2022.  TECHNIQUE: AP and oblique views of the lumbar spine. A lateral view is not provided  FINDINGS: No acute fractures. Sequela of T11 and T12 vertebral augmentation.  Dextrocurvature of the lumbar spine. No anterior or retrolisthesis on oblique views. Disc narrowing, endplate sclerosis, and osteophytosis greatest at L4-L5 and L5-S1. Facet joint arthropathy greatest at L4-L5 and L5-S1.  Vascular calcifications.    Red flags: Negative for bowel/bladder changes, saddle paresthesia, personal history of cancer, h/o spinal tumors, h/o compression fx, h/o abdominal aneurysm, abdominal pain, chills/fever, night sweats, nausea, vomiting, unrelenting pain, first onset of insidious LBP <20 y/o  PRECAUTIONS: None  WEIGHT BEARING RESTRICTIONS: No  FALLS: Has patient fallen in last 6 months?  No  Living Environment Lives with: lives alone Lives in: Apartment Stairs: No, floor-level apartment  Has following equipment at home: Single point cane and Environmental consultant - 4 wheeled  Prior level of function: Independent with community mobility with device  Hobbies: Walking exercise   Patient Goals:  "Get out of pain"    OBJECTIVE:   Gross Musculoskeletal Assessment Tremor: None Bulk: Normal Tone: Normal No visible step-off along spinal column Dextrocurvature of the lumbar spine   GAIT: Distance walked: 40 ft  Assistive device utilized: Single point cane Level of assistance: Modified independence Comments: Dec stance time, ipslateral sidebend with each step, significant calcaneal valgus R>L and dynamic genu valgum; pes planus   Posture: Lumbar lordosis: WNL Increased thoracic kyphosis, rounded shoulders Iliac crest height: Equal bilaterally Lumbar lateral shift: Negative   AROM AROM (Normal range in degrees) AROM  09/26/22  Lumbar   Flexion (65) 75%*  Extension (30) 50%*  Right lateral flexion (25) 75%* (pain L flank)  Left lateral flexion (25) 75* (mild pain L glute)  Right rotation (30) 50%  Left rotation (30) 50%      Hip Right Left  Flexion (125) WNL WNL  Extension (15)    Abduction (40) 40 ("pull") 40  Adduction     Internal Rotation (45) 15 15  External Rotation (45) WNL WNL      (* = pain; Blank rows = not tested)     LE MMT: MMT (out of 5) Right 10/01/22 Left 10/01/22  Hip flexion 4- 4+  Hip extension    Hip abduction (seated) 5 5  Hip adduction (seated) 5 5  Hip internal rotation    Hip external rotation    Knee flexion 4+ 4+  Knee extension 4+ 5  Ankle dorsiflexion 4 5  Ankle plantarflexion    Ankle inversion    Ankle eversion    (* = pain; Blank rows = not tested)  Sensation Grossly intact to light touch throughout bilateral LEs as determined by testing dermatomes L2-S2. Proprioception, stereognosis, and hot/cold testing deferred on  this date.  Reflexes R/L Knee Jerk (L3/4): 2+/2+  Ankle Jerk (S1/2): Unable to obtain  Muscle Length Hamstrings: R: Positive L: Positive [place] (quadriceps): R: Not examined L: Not examined   Palpation Location Right Left         Lumbar paraspinals 2 2  Quadratus Lumborum    Gluteus Maximus 1 1  Gluteus Medius 1 1  Deep hip external rotators    PSIS 1 1  Fortin's Area (SIJ)    Greater Trochanter    (Blank rows = not tested) Graded on 0-4 scale (0 = no pain, 1 = pain, 2 = pain with wincing/grimacing/flinching, 3 = pain with withdrawal, 4 = unwilling to allow palpation)  Passive Accessory Intervertebral Motion Pt has reproduction of back pain with CPA L1-L5. Generally, hypomobile throughout mid to lower lumbar spine.   Special Tests Lumbar Radiculopathy and Discogenic: Centralization and Peripheralization (SN 92, -LR 0.12): Not examined Slump (SN 83, -LR 0.32): R: Negative L: Negative SLR (SN 92, -LR 0.29): R: Negative L:  Negative  Facet Joint: Extension-Rotation (SN 100, -LR 0.0): R:  Positive L: Positive  Lumbar Foraminal Stenosis: Lumbar quadrant (SN 70): R: Negative L: Negative  Hip: FABER (SN 81): R: Positive L: Positive Hip scour (SN 50): R: Negative L: Negative      TODAY'S TREATMENT: DATE: 10/24/2022    SUBJECTIVE STATEMENT:   Pt reports 4-5/10 pain at arrival this AM. She had some soreness after dry needling last visit along her back. She reports some R lateral hip pain - primary c/o pain along R>L lumbar flank. Pt is compliant with HEP.    Manual Therapy - for symptom modulation, soft tissue sensitivity and mobility, joint mobility, ROM   STM and IASTM with Hypervolt along R>L L2-S1 erector spinae and R>L gluteal mm; x 23 minutes    *not today* STM R midline posterior thigh and lateral hamstrings General traction in hooklying: Negative for pain relief  Pain with CPA L3-5, held on continued mobilization    Trigger Point Dry Needling (TDN),  unbilled Education performed with patient regarding potential benefit of TDN. Reviewed precautions and risks with patient. Reviewed special precautions/risks over lung fields which include pneumothorax. Reviewed signs and symptoms of pneumothorax and advised pt to go to ER immediately if these symptoms develop advise them of dry needling treatment. Extensive time spent with pt to ensure full understanding of TDN risks. Pt provided verbal consent to treatment. TDN performed to  R iliocostalis lumborum at L4-5 and bilateral gluteus maximus with 0.30 x 70 single needle placements with local twitch response (LTR). Pistoning technique utilized. Improved pain-free motion following intervention.     Therapeutic Exercise - for improved soft tissue flexibility and extensibility as needed for ROM, improved strength as needed to improve performance of CKC activities/functional movements   MHP (unbilled) utilized during warm-up on NuStep for analgesic effect and improved soft tissue extensibility; x 5 minutes      3-way rollout with Blue physioball (anterior and anterolateral R/L), in sitting; x5 ea dir   Lower trunk rotation; 1x10 alternating R/L, 2 sec hold  Piriformis stretch, bilat LE today; x20; 1 sec hold     PATIENT EDUCATION: Encouraged continued HEP; discussed continued POC. We discussed post-needling expectations and mild adverse effects.     *not today* Posterior pelvic tilt; 2x10 NuStep; Level 2, x 5 minutes - for improved soft tissue mobility and increased tissue temperature to improve muscle performance   -subjective gathered during this time PPT with Bridge; 2x6 Active hamstrings stretch (sciatic floss technique); x20 bilat LE exercise stopped momentarily prn Seated pelvic tilt; 2x10 Hip ABD isometrics (belt fastened above knees); x10, 5 sec -for analgesic effect and graded loading to improve tolerance of loading affected tissues  Posterior pelvic tilt; 2x10  -PT demo and  cueing for technique Bow and arrow open book; 1x10 on each side    PATIENT EDUCATION:  Education details: see above for patient education details Person educated: Patient Education method: Explanation, Demonstration, and Handouts Education comprehension: verbalized understanding and returned demonstration   HOME EXERCISE PROGRAM:  Access Code: [email or web address] URL: [email or web address] Date: 10/03/2022 Prepared by: Consuela Mimes  Exercises - Supine Lower Trunk Rotation  - 2 x daily - 7 x weekly - 2 sets - 10 reps - 1-2sec hold - Supine Piriformis Stretch with Foot on Ground  - 2 x daily - 7 x weekly - 2 sets - 10 reps - 1-2sec hold - Sidelying Bow and Arrow Stretch  - 2 x daily - 7 x weekly - 2 sets - 10 reps - 2-3 sec hold - Supine  Posterior Pelvic Tilt  - 2 x daily - 7 x weekly - 2 sets - 10 reps - Supine Hamstring Stretch  - 2 x daily - 7 x weekly - 2 sets - 10 reps - 1-2sec hold   ASSESSMENT:  CLINICAL IMPRESSION: We completed second trial of dry needling today with sound twitch responses obtained. Pt has significant tenderness to palpation along R>L gluteal musculature just inferior to iliac crest. Patient has not complained of referred pain to thighs/lower limbs over last two weeks, but she has ongoing pain along lower lumbar region, R>L flank, and R>L gluteal region. Pt is doing well with home exercise; we will f/u on response with dry needling next visit. Pt has remaining deficits in: decreased thoracolumbar AROM, postural changes, decreased strength, gait changes, taut/tender bilateral lumbar paraspinals and gluteal mm. Pt will continue to benefit from skilled PT services to address deficits and improve function.   OBJECTIVE IMPAIRMENTS: Abnormal gait, decreased balance, decreased mobility, difficulty walking, decreased ROM, decreased strength, hypomobility, impaired flexibility, postural dysfunction, and pain.   ACTIVITY LIMITATIONS: carrying, lifting, bending,  sleeping, transfers, and bed mobility  PARTICIPATION LIMITATIONS: meal prep, cleaning, laundry, driving, and community activity  PERSONAL FACTORS: Age, Past/current experiences, Time since onset of injury/illness/exacerbation, and 1-2 comorbidities: HTN, Type 2 DM  are also affecting patient's functional outcome.   REHAB POTENTIAL: Good  CLINICAL DECISION MAKING: Evolving/moderate complexity  EVALUATION COMPLEXITY: Moderate   GOALS: Goals reviewed with patient? Yes  SHORT TERM GOALS: Target date: 10/19/2022  Pt will be independent with HEP in order to improve strength and decrease back pain to improve pain-free function at home and work. Baseline: 09/26/22: Baseline HEP initiated.  Goal status: INITIAL   LONG TERM GOALS: Target date: 11/09/2022  Pt will increase FOTO to at least 50 to demonstrate significant improvement in function at home and work related to back pain  Baseline: 09/26/22: 40 Goal status: INITIAL  2.  Pt will decrease worst back pain by at least 2 points on the NPRS in order to demonstrate clinically significant reduction in back pain. Baseline: 09/26/22: Pain 10/10 at worst Goal status: INITIAL  3.  Patient will have thoracolumbar AROM up to 75% or greater for all motions without reproduction of pain as needed for reaching items on ground, household chores, bending.     Baseline: 09/26/22: Motion loss in all planes, pain with flexion/extension and bilateral lateral flexion.  Goal status: INITIAL  4.  Pt will complete simulated lifting task with moving weighted items from table to adjacent shelf at shoulder/chest level indicative of improved ability to move household objects to complete chores Baseline: 09/26/22: Pain with lifting even light-to-moderate weights intermittently.  Goal status: INITIAL   PLAN: PT FREQUENCY: 1-2x/week  PT DURATION: 6 weeks  PLANNED INTERVENTIONS: Therapeutic exercises, Therapeutic activity, Neuromuscular re-education, Balance  training, Gait training, Patient/Family education, Self Care, Joint mobilization, Joint manipulation, Vestibular training, Canalith repositioning, Orthotic/Fit training, DME instructions, Dry Needling, Electrical stimulation, Spinal manipulation, Spinal mobilization, Cryotherapy, Moist heat, Taping, Traction, Ultrasound, Ionotophoresis 4mg /ml Dexamethasone, Manual therapy, and Re-evaluation.  PLAN FOR NEXT SESSION: Manual therapy for soft tissue sensitivity of lumbar paraspinals and gluteal mm. Progress with graded motion and isometrics for analgesic effect as tolerated. Update HEP as needed for successive visits.     Consuela Mimes, PT, DPT #[id number]  Gertie Exon, PT 10/24/2022, 9:10 AM

## 2022-10-29 ENCOUNTER — Encounter: Payer: Self-pay | Admitting: Physical Therapy

## 2022-10-29 ENCOUNTER — Ambulatory Visit: Payer: Medicare HMO | Admitting: Physical Therapy

## 2022-10-31 ENCOUNTER — Ambulatory Visit: Payer: Medicare HMO | Admitting: Physical Therapy

## 2022-10-31 ENCOUNTER — Encounter: Payer: Self-pay | Admitting: Physical Therapy

## 2022-10-31 DIAGNOSIS — M6281 Muscle weakness (generalized): Secondary | ICD-10-CM

## 2022-10-31 DIAGNOSIS — M545 Low back pain, unspecified: Secondary | ICD-10-CM

## 2022-10-31 DIAGNOSIS — M25551 Pain in right hip: Secondary | ICD-10-CM

## 2022-10-31 NOTE — Therapy (Signed)
OUTPATIENT PHYSICAL THERAPY TREATMENT  Patient Name: Jacqueline Arroyo MRN: [id number] DOB:July 28, 1940, 82 y.o., female Today's Date: 10/31/2022  END OF SESSION:  PT End of Session - 10/31/22 0947     Visit Number 10    Number of Visits 13    Date for PT Re-Evaluation 11/08/22    Authorization Type [employer]    PT Start Time 0900    PT Stop Time 0945    PT Time Calculation (min) 45 min    Equipment Utilized During Treatment --   pt utilizes personal SPC to negotiate office   Activity Tolerance Patient limited by pain;Patient tolerated treatment well    Behavior During Therapy [facility] for tasks assessed/performed                    Past Medical History:  Diagnosis Date   Diabetes mellitus without complication (HCC)    Hypertension    Past Surgical History:  Procedure Laterality Date   ABDOMINAL HYSTERECTOMY     BACK SURGERY     CHOLECYSTECTOMY     FOOT SURGERY     There are no problems to display for this patient.   PCP: [facility]  REFERRING PROVIDER: Carnella Guadalajara, DO  REFERRING DIAG:  M54.50 (ICD-10-CM) - Low back pain, unspecified  G89.29 (ICD-10-CM) - Other chronic pain    RATIONALE FOR EVALUATION AND TREATMENT: Rehabilitation  THERAPY DIAG: Chronic bilateral low back pain without sciatica  Bilateral hip pain  Muscle weakness (generalized)  ONSET DATE: 07/15/22 (most recent flare-up resulting in visit to [facility] ED)  FOLLOW-UP APPT SCHEDULED WITH REFERRING PROVIDER: None currently on schedule  PERTINENT HISTORY: Patient is an 82 year old female referred for chronic low back pain; 20+ years of pain with prior episodes of PT and lumbar spine injections. Pt reports being shaky in the morning and having intermittent tremor. Hx of kyphoplasty in midback per patient for compression fracture. Patient reports pain goes from one hip to the other. Pt reports pain down into R thigh. Back symptoms described as soreness at rest. Pt had to use Tramadol for  significant breakthrough pain. No recent sciatica. Some sensation of "swelling" in R lateral thigh. 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   PAIN:    Pain Intensity: Present: 4-5/10, Best: 4/10, Worst: 10/10 Pain location: Across waist and bilateral gluteal region Pain Quality:  sore/tender   Radiating: Yes , to bilat glutes, bilateral flank region  Numbness/Tingling: Yes; seldom numbness in L shin  Focal Weakness: No Aggravating factors: Lifting (sometimes even bottle of water), lying on R side/sometimes on L Relieving factors: Meloxicam/Bengay/Tylenol; Tramadol prn; lying on back and stretching out in bed; lying down; sitting  24-hour pain behavior: sometimes worse in AM, sometimes bad all day long   History of prior back injury, pain, surgery, or therapy: Yes; prior PT, injections, chronic back pain   Imaging: Yes ;  EXAM: XR LUMBAR SPINE AP LATERAL AND OBLIQUES DATE: 06/25/2022 1:14 PM ACCESSION: [id number] UN DICTATED: 06/25/2022 1:16 PM INTERPRETATION LOCATION: [facility]  CLINICAL INDICATION: 82 years old Female with lower back pain    COMPARISON: CT abdomen pelvis 03/15/2022.  TECHNIQUE: AP and oblique views of the lumbar spine. A lateral view is not provided  FINDINGS: No acute fractures. Sequela of T11 and T12 vertebral augmentation.  Dextrocurvature of the lumbar spine. No anterior or retrolisthesis on oblique views. Disc narrowing, endplate sclerosis, and osteophytosis greatest at L4-L5 and L5-S1. Facet joint arthropathy greatest at L4-L5 and L5-S1.  Vascular calcifications.    Red flags: Negative for bowel/bladder changes, saddle paresthesia, personal history of cancer, h/o spinal tumors, h/o compression fx, h/o abdominal aneurysm, abdominal pain, chills/fever, night sweats, nausea, vomiting, unrelenting pain, first onset of insidious LBP <20 y/o  PRECAUTIONS: None  WEIGHT BEARING RESTRICTIONS: No  FALLS: Has patient fallen in last 6 months?  No  Living Environment Lives with: lives alone Lives in: Apartment Stairs: No, floor-level apartment  Has following equipment at home: Single point cane and Environmental consultant - 4 wheeled  Prior level of function: Independent with community mobility with device  Hobbies: Walking exercise   Patient Goals:  "Get out of pain"    OBJECTIVE:   Gross Musculoskeletal Assessment Tremor: None Bulk: Normal Tone: Normal No visible step-off along spinal column Dextrocurvature of the lumbar spine   GAIT: Distance walked: 40 ft  Assistive device utilized: Single point cane Level of assistance: Modified independence Comments: Dec stance time, ipslateral sidebend with each step, significant calcaneal valgus R>L and dynamic genu valgum; pes planus   Posture: Lumbar lordosis: WNL Increased thoracic kyphosis, rounded shoulders Iliac crest height: Equal bilaterally Lumbar lateral shift: Negative   AROM AROM (Normal range in degrees) AROM  09/26/22  Lumbar   Flexion (65) 75%*  Extension (30) 50%*  Right lateral flexion (25) 75%* (pain L flank)  Left lateral flexion (25) 75* (mild pain L glute)  Right rotation (30) 50%  Left rotation (30) 50%      Hip Right Left  Flexion (125) WNL WNL  Extension (15)    Abduction (40) 40 ("pull") 40  Adduction     Internal Rotation (45) 15 15  External Rotation (45) WNL WNL      (* = pain; Blank rows = not tested)     LE MMT: MMT (out of 5) Right 10/01/22 Left 10/01/22  Hip flexion 4- 4+  Hip extension    Hip abduction (seated) 5 5  Hip adduction (seated) 5 5  Hip internal rotation    Hip external rotation    Knee flexion 4+ 4+  Knee extension 4+ 5  Ankle dorsiflexion 4 5  Ankle plantarflexion    Ankle inversion    Ankle eversion    (* = pain; Blank rows = not tested)  Sensation Grossly intact to light touch throughout bilateral LEs as determined by testing dermatomes L2-S2. Proprioception, stereognosis, and hot/cold testing deferred on  this date.  Reflexes R/L Knee Jerk (L3/4): 2+/2+  Ankle Jerk (S1/2): Unable to obtain  Muscle Length Hamstrings: R: Positive L: Positive E[place] (quadriceps): R: Not examined L: Not examined   Palpation Location Right Left         Lumbar paraspinals 2 2  Quadratus Lumborum    Gluteus Maximus 1 1  Gluteus Medius 1 1  Deep hip external rotators    PSIS 1 1  Fortin's Area (SIJ)    Greater Trochanter    (Blank rows = not tested) Graded on 0-4 scale (0 = no pain, 1 = pain, 2 = pain with wincing/grimacing/flinching, 3 = pain with withdrawal, 4 = unwilling to allow palpation)  Passive Accessory Intervertebral Motion Pt has reproduction of back pain with CPA L1-L5. Generally, hypomobile throughout mid to lower lumbar spine.   Special Tests Lumbar Radiculopathy and Discogenic: Centralization and Peripheralization (SN 92, -LR 0.12): Not examined Slump (SN 83, -LR 0.32): R: Negative L: Negative SLR (SN 92, -LR 0.29): R: Negative L:  Negative  Facet Joint: Extension-Rotation (SN 100, -LR 0.0): R:  Positive L: Positive  Lumbar Foraminal Stenosis: Lumbar quadrant (SN 70): R: Negative L: Negative  Hip: FABER (SN 81): R: Positive L: Positive Hip scour (SN 50): R: Negative L: Negative      TODAY'S TREATMENT: DATE: 10/31/2022    SUBJECTIVE STATEMENT:   Pt reports pain along low back and waist primarily. Some pain down to lumbosacral region. Pt denies notable gluteal pain. Pt reports some R lateral thigh pain temporarily last week. Patient reports symptoms aren't bad lying down. Pain around 5-6/10 at arrival to PT.     Manual Therapy - for symptom modulation, soft tissue sensitivity and mobility, joint mobility, ROM   STM and IASTM with Hypervolt along R>L L2-S1 erector spinae and R>L gluteal mm; x 23 minutes    *not today* STM R midline posterior thigh and lateral hamstrings General traction in hooklying: Negative for pain relief  Pain with CPA L3-5, held on continued  mobilization Trigger Point Dry Needling (TDN), unbilled Education performed with patient regarding potential benefit of TDN. Reviewed precautions and risks with patient. Reviewed special precautions/risks over lung fields which include pneumothorax. Reviewed signs and symptoms of pneumothorax and advised pt to go to ER immediately if these symptoms develop advise them of dry needling treatment. Extensive time spent with pt to ensure full understanding of TDN risks. Pt provided verbal consent to treatment. TDN performed to  R iliocostalis lumborum at L4-5 and bilateral gluteus maximus with 0.30 x 70 single needle placements with local twitch response (LTR). Pistoning technique utilized. Improved pain-free motion following intervention.     Therapeutic Exercise - for improved soft tissue flexibility and extensibility as needed for ROM, improved strength as needed to improve performance of CKC activities/functional movements   MHP (unbilled) utilized during warm-up on NuStep for analgesic effect and improved soft tissue extensibility; x 5 minutes      3-way rollout with Blue physioball (anterior and anterolateral R/L), in sitting; x5 ea dir   Lower trunk rotation; 1x10 alternating R/L, 2 sec hold  Piriformis stretch, bilat LE today; x20; 1 sec hold     PATIENT EDUCATION: Encouraged continued HEP; discussed continued POC. We discussed post-needling expectations and mild adverse effects.     *not today* Posterior pelvic tilt; 2x10 NuStep; Level 2, x 5 minutes - for improved soft tissue mobility and increased tissue temperature to improve muscle performance   -subjective gathered during this time PPT with Bridge; 2x6 Active hamstrings stretch (sciatic floss technique); x20 bilat LE exercise stopped momentarily prn Seated pelvic tilt; 2x10 Hip ABD isometrics (belt fastened above knees); x10, 5 sec -for analgesic effect and graded loading to improve tolerance of loading affected tissues   Posterior pelvic tilt; 2x10  -PT demo and cueing for technique Bow and arrow open book; 1x10 on each side    PATIENT EDUCATION:  Education details: see above for patient education details Person educated: Patient Education method: Explanation, Demonstration, and Handouts Education comprehension: verbalized understanding and returned demonstration   HOME EXERCISE PROGRAM:  Access Code: [email or web address] URL: [email or web address] Date: 10/03/2022 Prepared by: Consuela Mimes  Exercises - Supine Lower Trunk Rotation  - 2 x daily - 7 x weekly - 2 sets - 10 reps - 1-2sec hold - Supine Piriformis Stretch with Foot on Ground  - 2 x daily - 7 x weekly - 2 sets - 10 reps - 1-2sec hold - Sidelying Bow and Arrow Stretch  - 2 x daily - 7 x weekly - 2 sets - 10 reps - 2-3 sec hold -  Supine Posterior Pelvic Tilt  - 2 x daily - 7 x weekly - 2 sets - 10 reps - Supine Hamstring Stretch  - 2 x daily - 7 x weekly - 2 sets - 10 reps - 1-2sec hold   ASSESSMENT:  CLINICAL IMPRESSION: We completed second trial of dry needling today with sound twitch responses obtained. Pt has significant tenderness to palpation along R>L gluteal musculature just inferior to iliac crest. Patient has not complained of referred pain to thighs/lower limbs over last two weeks, but she has ongoing pain along lower lumbar region, R>L flank, and R>L gluteal region. Pt is doing well with home exercise; we will f/u on response with dry needling next visit. Pt has remaining deficits in: decreased thoracolumbar AROM, postural changes, decreased strength, gait changes, taut/tender bilateral lumbar paraspinals and gluteal mm. Pt will continue to benefit from skilled PT services to address deficits and improve function.   OBJECTIVE IMPAIRMENTS: Abnormal gait, decreased balance, decreased mobility, difficulty walking, decreased ROM, decreased strength, hypomobility, impaired flexibility, postural dysfunction, and pain.   ACTIVITY  LIMITATIONS: carrying, lifting, bending, sleeping, transfers, and bed mobility  PARTICIPATION LIMITATIONS: meal prep, cleaning, laundry, driving, and community activity  PERSONAL FACTORS: Age, Past/current experiences, Time since onset of injury/illness/exacerbation, and 1-2 comorbidities: HTN, Type 2 DM  are also affecting patient's functional outcome.   REHAB POTENTIAL: Good  CLINICAL DECISION MAKING: Evolving/moderate complexity  EVALUATION COMPLEXITY: Moderate   GOALS: Goals reviewed with patient? Yes  SHORT TERM GOALS: Target date: 10/19/2022  Pt will be independent with HEP in order to improve strength and decrease back pain to improve pain-free function at home and work. Baseline: 09/26/22: Baseline HEP initiated.    10/31/22: Pt is usually compliant, she missed a couple of days due to heightened pain.  Goal status: IN PROGRESS   LONG TERM GOALS: Target date: 11/09/2022  Pt will increase FOTO to at least 50 to demonstrate significant improvement in function at home and work related to back pain  Baseline: 09/26/22: 40.   10/31/22: 43/50 Goal status: IN PROGRESS   2.  Pt will decrease worst back pain by at least 2 points on the NPRS in order to demonstrate clinically significant reduction in back pain. Baseline: 09/26/22: Pain 10/10 at worst.   10/31/22: 10/10 at worst.  Goal status: NOT MET  3.  Patient will have thoracolumbar AROM up to 75% or greater for all motions without reproduction of pain as needed for reaching items on ground, household chores, bending.     Baseline: 09/26/22: Motion loss in all planes, pain with flexion/extension and bilateral lateral flexion.    Goal status: DEFERRED  4.  Pt will complete simulated lifting task with moving weighted items from table to adjacent shelf at shoulder/chest level indicative of improved ability to move household objects to complete chores Baseline: 09/26/22: Pain with lifting even light-to-moderate weights intermittently.   Goal status: DEFERRED   PLAN: PT FREQUENCY: 1-2x/week  PT DURATION: 6 weeks  PLANNED INTERVENTIONS: Therapeutic exercises, Therapeutic activity, Neuromuscular re-education, Balance training, Gait training, Patient/Family education, Self Care, Joint mobilization, Joint manipulation, Vestibular training, Canalith repositioning, Orthotic/Fit training, DME instructions, Dry Needling, Electrical stimulation, Spinal manipulation, Spinal mobilization, Cryotherapy, Moist heat, Taping, Traction, Ultrasound, Ionotophoresis 4mg /ml Dexamethasone, Manual therapy, and Re-evaluation.  PLAN FOR NEXT SESSION: Manual therapy for soft tissue sensitivity of lumbar paraspinals and gluteal mm. Progress with graded motion and isometrics for analgesic effect as tolerated. Update HEP as needed for successive visits.    THIS NOTE  IS INCOMPLETE, PLEASE DO NOT REFERENCE FOR INFORMATION  Consuela Mimes, PT, DPT #[id number]  Gertie Exon, PT 10/31/2022, 9:47 AM

## 2022-11-02 ENCOUNTER — Ambulatory Visit (INDEPENDENT_AMBULATORY_CARE_PROVIDER_SITE_OTHER): Payer: Medicare HMO

## 2022-11-02 ENCOUNTER — Encounter: Payer: Self-pay | Admitting: Emergency Medicine

## 2022-11-02 ENCOUNTER — Ambulatory Visit
Admission: EM | Admit: 2022-11-02 | Discharge: 2022-11-02 | Disposition: A | Discharge status: Home / Self Care | Payer: Medicare HMO | Attending: Family Medicine | Admitting: Family Medicine

## 2022-11-02 DIAGNOSIS — J069 Acute upper respiratory infection, unspecified: Secondary | ICD-10-CM

## 2022-11-02 MED ORDER — BENZONATATE 100 MG PO CAPS: 200.0000 mg | ORAL_CAPSULE | Freq: Three times a day (TID) | ORAL | 0 refills | Status: DC

## 2022-11-02 MED ORDER — IPRATROPIUM BROMIDE 0.06 % NA SOLN: 2.0000 | Freq: Four times a day (QID) | NASAL | 12 refills | Status: DC

## 2022-11-02 MED ORDER — PROMETHAZINE-DM 6.25-15 MG/5ML PO SYRP: 5.0000 mL | ORAL_SOLUTION | Freq: Four times a day (QID) | ORAL | 0 refills | Status: DC | PRN

## 2022-11-02 NOTE — Discharge Instructions (Addendum)
Your chest x-ray did not show any evidence of pneumonia.  I do believe you have a viral respiratory infection.  I also believe that your back pain is coming from muscle strain as result of coughing.  Use over-the-counter Tylenol, 1000 mg every 6 hours, as needed for back pain.  You may also apply topical Salonpas or over-the-counter topical lidocaine patches to your back.  Each patch is good for 8 hours.  Use the Atrovent nasal spray, 2 squirts in each nostril every 6 hours, as needed for runny nose and postnasal drip.  Use the Tessalon Perles every 8 hours during the day.  Take them with a small sip of water.  They may give you some numbness to the base of your tongue or a metallic taste in your mouth, this is normal.  Use the Promethazine DM cough syrup at bedtime for cough and congestion.  It will make you drowsy so do not take it during the day.  Return for reevaluation or see your primary care provider for any new or worsening symptoms.

## 2022-11-02 NOTE — ED Triage Notes (Signed)
Patient c/o cough, chest congestion, and sinus drainage that started a week ago.  Patient reports recently having right sided rib/back pain.  Patient denies any injury or fall.

## 2022-11-02 NOTE — ED Provider Notes (Signed)
[facility]    CSN: [id number] Arrival date & time: 11/02/22  1000      History   Chief Complaint Chief Complaint  Patient presents with   Cough   Back Pain    HPI Tenecia Ignasiak is a 82 y.o. female.   HPI  82 year old female with a past medical history significant for hypertension and diabetes presents for evaluation of 1 week worth of respiratory symptoms to include sinus pressure with infrequent nasal discharge, cough that is intermittently productive for scant amount of sputum, mild shortness of breath on exertion, and pain in the back on the right side.  She denies fever or wheezing.  Past Medical History:  Diagnosis Date   Diabetes mellitus without complication (HCC)    Hypertension     There are no problems to display for this patient.   Past Surgical History:  Procedure Laterality Date   ABDOMINAL HYSTERECTOMY     BACK SURGERY     CHOLECYSTECTOMY     FOOT SURGERY      OB History   No obstetric history on file.      Home Medications    Prior to Admission medications   Medication Sig Start Date End Date Taking? Authorizing Provider  benzonatate (TESSALON) 100 MG capsule Take 2 capsules (200 mg total) by mouth every 8 (eight) hours. 11/02/22  Yes Becky Augusta, NP  ipratropium (ATROVENT) 0.06 % nasal spray Place 2 sprays into both nostrils 4 (four) times daily. 11/02/22  Yes Becky Augusta, NP  promethazine-dextromethorphan (PROMETHAZINE-DM) 6.25-15 MG/5ML syrup Take 5 mLs by mouth 4 (four) times daily as needed. 11/02/22  Yes Becky Augusta, NP  acetic acid 2 % otic solution SMARTSIG:Left Ear 01/19/21   [provider]  albuterol (VENTOLIN HFA) 108 (90 Base) MCG/ACT inhaler Inhale into the lungs. 07/12/19   [provider]  amLODipine (NORVASC) 10 MG tablet Take 10 mg by mouth daily. 09/01/20   [provider]  aspirin 81 MG EC tablet Take by mouth. 07/17/19   [provider]  atorvastatin (LIPITOR) 10 MG tablet Take 1  tablet by mouth daily. 03/01/20   [provider]  calcium-vitamin D (OSCAL WITH D) 500-200 MG-UNIT TABS tablet Take by mouth.    [provider]  Cyanocobalamin 2000 MCG TBCR Take by mouth.    [provider]  cycloSPORINE (RESTASIS) 0.05 % ophthalmic emulsion INSTILL 1 DROP INTO EACH EYE TWICE DAILY 01/29/20   [provider]  estradiol (ESTRACE) 0.1 MG/GM vaginal cream SMARTSIG:Gram(s) Vaginal 3 Times a Week 08/17/20   [provider]  fenofibrate (TRICOR) 145 MG tablet Take by mouth. 06/27/20   [provider]  fexofenadine (ALLEGRA) 180 MG tablet Take 1 tablet (180 mg total) by mouth daily. 09/12/20   Rodriguez-Southworth, Viviana Simpler  FLOVENT HFA 110 MCG/ACT inhaler SMARTSIG:2 Puff(s) By Mouth Twice Daily 05/09/20   [provider]  fluticasone (FLONASE) 50 MCG/ACT nasal spray 1 spray into each nostril daily. 12/27/20 12/27/21  [provider]  furosemide (LASIX) 20 MG tablet Take by mouth. 07/23/19   [provider]  hydroxyurea (HYDREA) 500 MG capsule Take by mouth. 02/26/22   [provider]  ketoconazole (NIZORAL) 2 % cream Apply topically daily. 03/11/20   [provider]  Lancets (FREESTYLE) lancets Check blood sugar twice daily 12/27/20   [provider]  lisinopril (ZESTRIL) 30 MG tablet Take 1 tablet by mouth daily. 12/29/20 12/29/21  [provider]  LORazepam (ATIVAN) 0.5 MG tablet  Take by mouth. 07/31/19   [provider]  meclizine (ANTIVERT) 12.5 MG tablet Take 12.5 mg by mouth 2 (two) times daily as needed. 01/18/21   [provider]  meloxicam (MOBIC) 7.5 MG tablet Take 1 tablet by mouth daily. 01/26/20   [provider]  metFORMIN (GLUCOPHAGE) 500 MG tablet Take 500 mg by mouth daily. 08/31/20   [provider]  methocarbamol (ROBAXIN) 500 MG tablet Take 1 tablet (500 mg total) by mouth 2 (two) times daily. 01/14/22   Katha Cabal, DO   metoprolol succinate (TOPROL-XL) 50 MG 24 hr tablet Take 50 mg by mouth 2 (two) times daily. 09/08/19   [provider]  mupirocin cream (BACTROBAN) 2 % Apply 1 Application topically 2 (two) times daily. 07/07/22   Lorre Munroe, NP  spironolactone (ALDACTONE) 25 MG tablet Take 25 mg by mouth daily. 08/06/20   [provider]  triamcinolone cream (KENALOG) 0.1 % Apply 1 Application topically 2 (two) times daily. 07/07/22   Lorre Munroe, NP  UNABLE TO FIND Med Name: HCTZ 10 mg    [provider]    Family History History reviewed. No pertinent family history.  Social History Social History   Tobacco Use   Smoking status: Never   Smokeless tobacco: Never  Vaping Use   Vaping status: Never Used  Substance Use Topics   Alcohol use: Never   Drug use: Never     Allergies   Amoxicillin, Ciprofloxacin, Keflex [cephalexin], Hydrocodone-acetaminophen, and Sulfamethoxazole   Review of Systems Review of Systems  Constitutional:  Negative for fever.  HENT:  Positive for congestion, rhinorrhea and sinus pressure.   Respiratory:  Positive for cough and shortness of breath. Negative for wheezing.   Musculoskeletal:  Positive for back pain.     Physical Exam Triage Vital Signs ED Triage Vitals  Encounter Vitals Group     BP 11/02/22 1023 (!) 166/73     Systolic BP Percentile --      Diastolic BP Percentile --      Pulse Rate 11/02/22 1023 89     Resp 11/02/22 1023 14     Temp 11/02/22 1023 98.4 F (36.9 C)     Temp Source 11/02/22 1023 Oral     SpO2 11/02/22 1023 93 %     Weight 11/02/22 1022 173 lb 15.1 oz (78.9 kg)     Height 11/02/22 1022 5\' 2"  (1.575 m)     Head Circumference --      Peak Flow --      Pain Score 11/02/22 1021 5     Pain Loc --      Pain Education --      Exclude from Growth Chart --    No data found.  Updated Vital Signs BP (!) 166/73 (BP Location: Left Arm)   Pulse 89   Temp 98.4 F (36.9 C) (Oral)   Resp 14   Ht 5\' 2"   (1.575 m)   Wt 173 lb 15.1 oz (78.9 kg)   SpO2 93%   BMI 31.81 kg/m   Visual Acuity Right Eye Distance:   Left Eye Distance:   Bilateral Distance:    Right Eye Near:   Left Eye Near:    Bilateral Near:     Physical Exam Vitals and nursing note reviewed.  Constitutional:      Appearance: Normal appearance. She is not ill-appearing.  HENT:     Head: Normocephalic and atraumatic.     Nose: Nose  normal. No congestion or rhinorrhea.     Mouth/Throat:     Mouth: Mucous membranes are moist.     Pharynx: Oropharynx is clear. No oropharyngeal exudate or posterior oropharyngeal erythema.     Comments: Bilateral frontal and maxillary sinuses are nontender to compression. Cardiovascular:     Rate and Rhythm: Normal rate and regular rhythm.     Pulses: Normal pulses.     Heart sounds: Normal heart sounds. No murmur heard.    No friction rub. No gallop.  Pulmonary:     Effort: Pulmonary effort is normal.     Breath sounds: Normal breath sounds. No wheezing, rhonchi or rales.  Musculoskeletal:     Cervical back: Normal range of motion and neck supple. No tenderness.  Skin:    General: Skin is warm and dry.     Capillary Refill: Capillary refill takes less than 2 seconds.  Neurological:     General: No focal deficit present.     Mental Status: She is alert and oriented to person, place, and time.      UC Treatments / Results  Labs (all labs ordered are listed, but only abnormal results are displayed) Labs Reviewed - No data to display  EKG   Radiology No results found.  Procedures Procedures (including critical care time)  Medications Ordered in UC Medications - No data to display  Initial Impression / Assessment and Plan / UC Course  I have reviewed the triage vital signs and the nursing notes.  Pertinent labs & imaging results that were available during my care of the patient were reviewed by me and considered in my medical decision making (see chart for  details).   Patient is a nontoxic-appearing 82 year old female presenting for evaluation of 1 week worth of respiratory symptoms as outlined HPI above.  She reports that she has sinus pressure but is not experiencing nasal discharge except for in the mornings.  She also has had a cough for the last week that has been intermittently productive but typically dry.  She did have some shortness of breath this morning which improved with a nebulizer treatment.  The shortness of breath is only with exertion.  She denies any wheezing.  Patient is able to speak in full sentence without dyspnea or tachypnea though her room air oxygen saturation is 93%.  On exam patient's upper respiratory tree is unremarkable and her cardiopulmonary exam reveals clear lung sounds in all fields.  She is complaining of pain on the right side of her back that started yesterday.  I will obtain a chest x-ray to evaluate for any acute cardiopulmonary pathology.  Chest x-ray independently reviewed and evaluated by me.  Impression: No evidence infiltrate or effusion.  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  Evidence of previous kyphoplasty present on the lateral over T8 and T9.  Radiology overread is pending.  Discharge patient on the diagnosis of viral URI with cough.  I will prescribe management nasal spray she can use for nasal congestion along with Tessalon Perles and Promethazine DM cough syrup for cough and congestion.  Her back pain is most likely related to coughing and she can use topical over-the-counter Salonpas or lidocaine patches along with over-the-counter Tylenol 1000 mg every 6 hours.   Final Clinical Impressions(s) / UC Diagnoses   Final diagnoses:  Viral URI with cough     Discharge Instructions      Your chest x-ray did not show any evidence  of pneumonia.  I do believe you have a viral respiratory infection.  I also believe that your back pain is coming from muscle  strain as result of coughing.  Use over-the-counter Tylenol, 1000 mg every 6 hours, as needed for back pain.  You may also apply topical Salonpas or over-the-counter topical lidocaine patches to your back.  Each patch is good for 8 hours.  Use the Atrovent nasal spray, 2 squirts in each nostril every 6 hours, as needed for runny nose and postnasal drip.  Use the Tessalon Perles every 8 hours during the day.  Take them with a small sip of water.  They may give you some numbness to the base of your tongue or a metallic taste in your mouth, this is normal.  Use the Promethazine DM cough syrup at bedtime for cough and congestion.  It will make you drowsy so do not take it during the day.  Return for reevaluation or see your primary care provider for any new or worsening symptoms.      ED Prescriptions     Medication Sig Dispense Auth. Provider   benzonatate (TESSALON) 100 MG capsule Take 2 capsules (200 mg total) by mouth every 8 (eight) hours. 21 capsule Becky Augusta, NP   ipratropium (ATROVENT) 0.06 % nasal spray Place 2 sprays into both nostrils 4 (four) times daily. 15 mL Becky Augusta, NP   promethazine-dextromethorphan (PROMETHAZINE-DM) 6.25-15 MG/5ML syrup Take 5 mLs by mouth 4 (four) times daily as needed. 118 mL Becky Augusta, NP      PDMP not reviewed this encounter.   Becky Augusta, NP 11/02/22 1108

## 2022-11-05 ENCOUNTER — Ambulatory Visit: Payer: Medicare HMO | Attending: Physical Medicine & Rehabilitation | Admitting: Physical Therapy

## 2022-11-05 ENCOUNTER — Encounter: Payer: Self-pay | Admitting: Emergency Medicine

## 2022-11-05 ENCOUNTER — Ambulatory Visit
Admission: EM | Admit: 2022-11-05 | Discharge: 2022-11-05 | Disposition: A | Discharge status: Home / Self Care | Payer: Medicare HMO | Attending: Emergency Medicine | Admitting: Emergency Medicine

## 2022-11-05 DIAGNOSIS — M25551 Pain in right hip: Secondary | ICD-10-CM | POA: Diagnosis present

## 2022-11-05 DIAGNOSIS — M545 Low back pain, unspecified: Secondary | ICD-10-CM | POA: Insufficient documentation

## 2022-11-05 DIAGNOSIS — G8929 Other chronic pain: Secondary | ICD-10-CM | POA: Insufficient documentation

## 2022-11-05 DIAGNOSIS — J0140 Acute pansinusitis, unspecified: Secondary | ICD-10-CM | POA: Diagnosis not present

## 2022-11-05 DIAGNOSIS — M6281 Muscle weakness (generalized): Secondary | ICD-10-CM | POA: Diagnosis present

## 2022-11-05 DIAGNOSIS — M25552 Pain in left hip: Secondary | ICD-10-CM | POA: Diagnosis present

## 2022-11-05 MED ORDER — AMOXICILLIN-POT CLAVULANATE 875-125 MG PO TABS
1.0000 | ORAL_TABLET | Freq: Two times a day (BID) | ORAL | 0 refills | Status: AC
Start: 2022-11-05 — End: ?

## 2022-11-05 MED ORDER — AZITHROMYCIN 250 MG PO TABS: 250.0000 mg | ORAL_TABLET | Freq: Every day | ORAL | 0 refills | Status: DC

## 2022-11-05 NOTE — Unmapped (Signed)
Copied from CRM #[id number]. Topic: Scheduling - New Appointment  >> Nov 05, 2022  3:38 PM Caitlyn W wrote:  Caller asked to schedule a new appointment. No appointments available until Thursday and patient wanted something sooner. Declined regional scheduling. TE to FD    The patient is requesting for the Front Desk to contact them in regards to wanting to be worked in with anyone at [employer] office for upper respiratory infection. Declined regional scheduling  Please contact The patient by Cell Phone    Urgent callback turnaround time: within 24 business hours. Programmer, systems Notified)

## 2022-11-05 NOTE — Discharge Instructions (Signed)
Try the Augmentin first.  This is now first-line for sinus infections.  Azithromycin has not been recommended for sinusitis in many years as it is very frequently ineffective.  Take some probiotics and eat fermented foods such as yogurt to help prevent stomach upset while taking the antibiotics.  Continue Mucinex to keep the mucous thin and to decongest you.   Stop the Benadryl.  Continue Flonase.  Use a NeilMed sinus rinse with distilled water as often as you want to to reduce nasal congestion. Follow the directions on the box.   Go to www.goodrx.com to look up your medications. This will give you a list of where you can find your prescriptions at the most affordable prices. Or you can ask the pharmacist what the cash price is. This is frequently cheaper than going through insurance.

## 2022-11-05 NOTE — ED Triage Notes (Signed)
Pt presents with a cough, nasal congestion and sinus pressure x 2 weeks. Pt was seen on 11/1 and states the medication prescribed is not helping. She doesn't want to take cough syrup due to it being sweet and raising her blood sugar.

## 2022-11-05 NOTE — ED Provider Notes (Signed)
HPI  SUBJECTIVE:  Shauniece Arroyo is a 82 y.o. female who presents with 2 weeks of nasal congestion, sinus pain and pressure, facial swelling, clear rhinorrhea, postnasal drip, sore throat, occasional coughing and wheezing.  No upper dental pain, fevers, shortness of breath, new or different dyspnea on exertion.  No antibiotics in the past month.  Patient was seen here 3 days ago for the symptoms, chest x-ray was normal.  She was thought to have a viral URI with cough, was prescribed Tessalon Perles, Promethazine DM, Atrovent nasal spray.  States that she tried the [employer], but discontinued it because her cough is getting better, she tried the promethazine but discontinue it because it elevated her blood sugars.  She never started the Atrovent nasal spray as she is already on Flonase.  She has also tried Benadryl, Mucinex, Tylenol.  Last dose of Tylenol was within 6 hours of evaluation.  She has a past medical history of diabetes, hypertension, CHF, polycythemia vera.  No history of pulmonary disease, smoking.  She states that she is allergic to amoxicillin, states that it "does not agree with her".  Denies urticaria, severe allergic reaction, anaphylaxis.  States that she cannot tolerate doxycycline as it causes palpitations after taking it for a few days.  She is also allergic to Cipro, cephalosporins, Bactrim.  PCP: Cannot remember.  Past Medical History:  Diagnosis Date   Diabetes mellitus without complication (HCC)    Hypertension     Past Surgical History:  Procedure Laterality Date   ABDOMINAL HYSTERECTOMY     BACK SURGERY     CHOLECYSTECTOMY     FOOT SURGERY      History reviewed. No pertinent family history.  Social History   Tobacco Use   Smoking status: Never   Smokeless tobacco: Never  Vaping Use   Vaping status: Never Used  Substance Use Topics   Alcohol use: Never   Drug use: Never    No current facility-administered medications for this encounter.  Current  Outpatient Medications:    amoxicillin-clavulanate (AUGMENTIN) 875-125 MG tablet, Take 1 tablet by mouth every 12 (twelve) hours., Disp: 14 tablet, Rfl: 0   azithromycin (ZITHROMAX) 250 MG tablet, Take 1 tablet (250 mg total) by mouth daily. 2 tabs po on day 1, 1 tab po on days 2-5, Disp: 6 tablet, Rfl: 0   acetic acid 2 % otic solution, SMARTSIG:Left Ear, Disp: , Rfl:    albuterol (VENTOLIN HFA) 108 (90 Base) MCG/ACT inhaler, Inhale into the lungs., Disp: , Rfl:    amLODipine (NORVASC) 10 MG tablet, Take 10 mg by mouth daily., Disp: , Rfl:    aspirin 81 MG EC tablet, Take by mouth., Disp: , Rfl:    atorvastatin (LIPITOR) 10 MG tablet, Take 1 tablet by mouth daily., Disp: , Rfl:    calcium-vitamin D (OSCAL WITH D) 500-200 MG-UNIT TABS tablet, Take by mouth., Disp: , Rfl:    Cyanocobalamin 2000 MCG TBCR, Take by mouth., Disp: , Rfl:    cycloSPORINE (RESTASIS) 0.05 % ophthalmic emulsion, INSTILL 1 DROP INTO EACH EYE TWICE DAILY, Disp: , Rfl:    estradiol (ESTRACE) 0.1 MG/GM vaginal cream, SMARTSIG:Gram(s) Vaginal 3 Times a Week, Disp: , Rfl:    fenofibrate (TRICOR) 145 MG tablet, Take by mouth., Disp: , Rfl:    FLOVENT HFA 110 MCG/ACT inhaler, SMARTSIG:2 Puff(s) By Mouth Twice Daily, Disp: , Rfl:    fluticasone (FLONASE) 50 MCG/ACT nasal spray, 1 spray into each nostril daily., Disp: , Rfl:  furosemide (LASIX) 20 MG tablet, Take by mouth., Disp: , Rfl:    hydroxyurea (HYDREA) 500 MG capsule, Take by mouth., Disp: , Rfl:    ketoconazole (NIZORAL) 2 % cream, Apply topically daily., Disp: , Rfl:    Lancets (FREESTYLE) lancets, Check blood sugar twice daily, Disp: , Rfl:    lisinopril (ZESTRIL) 30 MG tablet, Take 1 tablet by mouth daily., Disp: , Rfl:    LORazepam (ATIVAN) 0.5 MG tablet, Take by mouth., Disp: , Rfl:    meclizine (ANTIVERT) 12.5 MG tablet, Take 12.5 mg by mouth 2 (two) times daily as needed., Disp: , Rfl:    meloxicam (MOBIC) 7.5 MG tablet, Take 1 tablet by mouth daily., Disp: ,  Rfl:    metFORMIN (GLUCOPHAGE) 500 MG tablet, Take 500 mg by mouth daily., Disp: , Rfl:    methocarbamol (ROBAXIN) 500 MG tablet, Take 1 tablet (500 mg total) by mouth 2 (two) times daily., Disp: 30 tablet, Rfl: 0   metoprolol succinate (TOPROL-XL) 50 MG 24 hr tablet, Take 50 mg by mouth 2 (two) times daily., Disp: , Rfl:    mupirocin cream (BACTROBAN) 2 %, Apply 1 Application topically 2 (two) times daily., Disp: 15 g, Rfl: 0   spironolactone (ALDACTONE) 25 MG tablet, Take 25 mg by mouth daily., Disp: , Rfl:    triamcinolone cream (KENALOG) 0.1 %, Apply 1 Application topically 2 (two) times daily., Disp: 30 g, Rfl: 0   UNABLE TO FIND, Med Name: HCTZ 10 mg, Disp: , Rfl:   Allergies  Allergen Reactions   Amoxicillin    Ciprofloxacin    Keflex [Cephalexin]    Hydrocodone-Acetaminophen Nausea And Vomiting   Sulfamethoxazole Palpitations     ROS  As noted in HPI.   Physical Exam  BP (!) 152/70 (BP Location: Left Arm)   Pulse 84   Temp 98.1 F (36.7 C) (Oral)   Resp 16   SpO2 94%   Constitutional: Well developed, well nourished, no acute distress Eyes:  EOMI, conjunctiva normal bilaterally HENT: Normocephalic, atraumatic,mucus membranes moist.  Erythematous, swollen turbinates.  Purulent nasal Discharge.  Positive exquisite maxillary and frontal sinus tenderness.  Positive postnasal drip. Respiratory: Normal inspiratory effort, lungs clear bilaterally Cardiovascular: Normal rate, regular rhythm GI: nondistended skin: No rash, skin intact Musculoskeletal: no deformities Neurologic: Alert & oriented x 3, no focal neuro deficits Psychiatric: Speech and behavior appropriate   ED Course   Medications - No data to display  No orders of the defined types were placed in this encounter.   No results found for this or any previous visit (from the past 24 hour(s)). No results found.  ED Clinical Impression  1. Acute non-recurrent pansinusitis      ED  Assessment/Plan     Previous records reviewed.  As noted in HPI.  Patient presents with a sinusitis that has been going on for 2 weeks with severe symptoms of facial swelling.  She states that amoxicillin "does not agree with me", but denies anaphylaxis or urticaria.  Had a long discussion with her that Augmentin is now the first line treatment for sinusitis and that azithromycin has not been recommended for sinus infections since 2012.  She states that she cannot tolerate doxycycline as it causes palpitations.  She states that Z-Pak has helped her in the past.  Will have her trial Augmentin, and if she cannot tolerate it then she has azithromycin as a backup.  Advised probiotic.  Continue Flonase.  Discontinue Benadryl, continue Mucinex, start saline nasal  irrigation.  Follow-up with PCP.  Discussed  MDM, treatment plan, and plan for follow-up with patient. . patient agrees with plan.   Meds ordered this encounter  Medications   amoxicillin-clavulanate (AUGMENTIN) 875-125 MG tablet    Sig: Take 1 tablet by mouth every 12 (twelve) hours.    Dispense:  14 tablet    Refill:  0   azithromycin (ZITHROMAX) 250 MG tablet    Sig: Take 1 tablet (250 mg total) by mouth daily. 2 tabs po on day 1, 1 tab po on days 2-5    Dispense:  6 tablet    Refill:  0      *This clinic note was created using Scientist, clinical (histocompatibility and immunogenetics). Therefore, there may be occasional mistakes despite careful proofreading.  ?    Domenick Gong, MD 11/06/22 [phone]

## 2022-11-05 NOTE — Therapy (Addendum)
OUTPATIENT PHYSICAL THERAPY TREATMENT/GOAL UPDATE   Patient Name: Jacqueline Arroyo MRN: [id number] DOB:02-17-1940, 82 y.o., female Today's Date: 11/05/2022  END OF SESSION:  PT End of Session - 11/05/22 0858     Visit Number 11    Number of Visits 13    Date for PT Re-Evaluation 11/08/22    Authorization Type [employer]    PT Start Time 0853    PT Stop Time 0936    PT Time Calculation (min) 43 min    Equipment Utilized During Treatment --   pt utilizes personal SPC to negotiate office   Activity Tolerance Patient limited by pain;Patient tolerated treatment well    Behavior During Therapy [facility] for tasks assessed/performed               Past Medical History:  Diagnosis Date   Diabetes mellitus without complication (HCC)    Hypertension    Past Surgical History:  Procedure Laterality Date   ABDOMINAL HYSTERECTOMY     BACK SURGERY     CHOLECYSTECTOMY     FOOT SURGERY     There are no problems to display for this patient.   PCP: [facility]  REFERRING PROVIDER: [facility]  REFERRING DIAG:  M54.50 (ICD-10-CM) - Low back pain, unspecified  G89.29 (ICD-10-CM) - Other chronic pain    RATIONALE FOR EVALUATION AND TREATMENT: Rehabilitation  THERAPY DIAG: Chronic bilateral low back pain without sciatica  Bilateral hip pain  Muscle weakness (generalized)  ONSET DATE: 07/15/22 (most recent flare-up resulting in visit to [facility] ED)  FOLLOW-UP APPT SCHEDULED WITH REFERRING PROVIDER: None currently on schedule  PERTINENT HISTORY: Patient is an 82 year old female referred for chronic low back pain; 20+ years of pain with prior episodes of PT and lumbar spine injections. Pt reports being shaky in the morning and having intermittent tremor. Hx of kyphoplasty in midback per patient for compression fracture. Patient reports pain goes from one hip to the other. Pt reports pain down into R thigh. Back symptoms described as soreness at rest. Pt had to use Tramadol for  significant breakthrough pain. No recent sciatica. Some sensation of "swelling" in R lateral thigh. 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   PAIN:    Pain Intensity: Present: 4-5/10, Best: 4/10, Worst: 10/10 Pain location: Across waist and bilateral gluteal region Pain Quality:  sore/tender   Radiating: Yes , to bilat glutes, bilateral flank region  Numbness/Tingling: Yes; seldom numbness in L shin  Focal Weakness: No Aggravating factors: Lifting (sometimes even bottle of water), lying on R side/sometimes on L Relieving factors: Meloxicam/Bengay/Tylenol; Tramadol prn; lying on back and stretching out in bed; lying down; sitting  24-hour pain behavior: sometimes worse in AM, sometimes bad all day long   History of prior back injury, pain, surgery, or therapy: Yes; prior PT, injections, chronic back pain   Imaging: Yes ;  EXAM: XR LUMBAR SPINE AP LATERAL AND OBLIQUES DATE: 06/25/2022 1:14 PM ACCESSION: [id number] UN DICTATED: 06/25/2022 1:16 PM INTERPRETATION LOCATION: [facility]  CLINICAL INDICATION: 82 years old Female with lower back pain    COMPARISON: CT abdomen pelvis 03/15/2022.  TECHNIQUE: AP and oblique views of the lumbar spine. A lateral view is not provided  FINDINGS: No acute fractures. Sequela of T11 and T12 vertebral augmentation.  Dextrocurvature of the lumbar spine. No anterior or retrolisthesis on oblique views. Disc narrowing, endplate sclerosis, and osteophytosis greatest at L4-L5 and L5-S1. Facet joint arthropathy greatest at L4-L5 and L5-S1.  Vascular calcifications.  Red flags: Negative for bowel/bladder changes, saddle paresthesia, personal history of cancer, h/o spinal tumors, h/o compression fx, h/o abdominal aneurysm, abdominal pain, chills/fever, night sweats, nausea, vomiting, unrelenting pain, first onset of insidious LBP <20 y/o  PRECAUTIONS: None  WEIGHT BEARING RESTRICTIONS: No  FALLS: Has patient fallen in last 6 months?  No  Living Environment Lives with: lives alone Lives in: Apartment Stairs: No, floor-level apartment  Has following equipment at home: Single point cane and Environmental consultant - 4 wheeled  Prior level of function: Independent with community mobility with device  Hobbies: Walking exercise   Patient Goals:  "Get out of pain"    OBJECTIVE:   Gross Musculoskeletal Assessment Tremor: None Bulk: Normal Tone: Normal No visible step-off along spinal column Dextrocurvature of the lumbar spine   GAIT: Distance walked: 40 ft  Assistive device utilized: Single point cane Level of assistance: Modified independence Comments: Dec stance time, ipslateral sidebend with each step, significant calcaneal valgus R>L and dynamic genu valgum; pes planus   Posture: Lumbar lordosis: WNL Increased thoracic kyphosis, rounded shoulders Iliac crest height: Equal bilaterally Lumbar lateral shift: Negative   AROM AROM (Normal range in degrees) AROM  09/26/22 AROM 11/05/22  Lumbar    Flexion (65) 75%* 75% ("little sore")  Extension (30) 50%* 50%* (pain across waist)  Right lateral flexion (25) 75%* (pain L flank) 75%* (pain R flank)  Left lateral flexion (25) 75* (mild pain L glute) 75%  Right rotation (30) 50% 75%* (mild pain, hips)  Left rotation (30) 50% 75%* (mild pain, hips)       Hip Right Left   Flexion (125) WNL WNL   Extension (15)     Abduction (40) 40 ("pull") 40   Adduction      Internal Rotation (45) 15 15   External Rotation (45) WNL WNL        (* = pain; Blank rows = not tested)     LE MMT: MMT (out of 5) Right 10/01/22 Left 10/01/22  Hip flexion 4- 4+  Hip extension    Hip abduction (seated) 5 5  Hip adduction (seated) 5 5  Hip internal rotation    Hip external rotation    Knee flexion 4+ 4+  Knee extension 4+ 5  Ankle dorsiflexion 4 5  Ankle plantarflexion    Ankle inversion    Ankle eversion    (* = pain; Blank rows = not tested)  Sensation Grossly intact to light  touch throughout bilateral LEs as determined by testing dermatomes L2-S2. Proprioception, stereognosis, and hot/cold testing deferred on this date.  Reflexes R/L Knee Jerk (L3/4): 2+/2+  Ankle Jerk (S1/2): Unable to obtain  Muscle Length Hamstrings: R: Positive L: Positive [place] (quadriceps): R: Not examined L: Not examined   Palpation Location Right Left         Lumbar paraspinals 2 2  Quadratus Lumborum    Gluteus Maximus 1 1  Gluteus Medius 1 1  Deep hip external rotators    PSIS 1 1  Fortin's Area (SIJ)    Greater Trochanter    (Blank rows = not tested) Graded on 0-4 scale (0 = no pain, 1 = pain, 2 = pain with wincing/grimacing/flinching, 3 = pain with withdrawal, 4 = unwilling to allow palpation)  Passive Accessory Intervertebral Motion Pt has reproduction of back pain with CPA L1-L5. Generally, hypomobile throughout mid to lower lumbar spine.   Special Tests Lumbar Radiculopathy and Discogenic: Centralization and Peripheralization (SN 92, -LR 0.12): Not examined Slump (  SN 83, -LR 0.32): R: Negative L: Negative SLR (SN 92, -LR 0.29): R: Negative L:  Negative  Facet Joint: Extension-Rotation (SN 100, -LR 0.0): R: Positive L: Positive  Lumbar Foraminal Stenosis: Lumbar quadrant (SN 70): R: Negative L: Negative  Hip: FABER (SN 81): R: Positive L: Positive Hip scour (SN 50): R: Negative L: Negative      TODAY'S TREATMENT: DATE: 11/05/2022    SUBJECTIVE STATEMENT:   Pt reports some pain affecting L lumbar flank region in particular at arrival. Patient reports 6/10 pain presently. Patient reports no major issues after last visit. Patient reports compliance with her HEP. Patient reports feeling that she may have started PT too late.     MHP (unbilled) utilized during active warm-up and manual therapy for analgesic effect and improved soft tissue extensibility; x 15 minutes    Manual Therapy - for symptom modulation, soft tissue sensitivity and mobility, joint  mobility, ROM   STM and IASTM with Hypervolt along R>L L2-S1 erector spinae and R>L gluteal mm; x 12 minutes    *not today* STM R midline posterior thigh and lateral hamstrings General traction in hooklying: Negative for pain relief  Pain with CPA L3-5, held on continued mobilization Trigger Point Dry Needling (TDN), unbilled Education performed with patient regarding potential benefit of TDN. Reviewed precautions and risks with patient. Reviewed special precautions/risks over lung fields which include pneumothorax. Reviewed signs and symptoms of pneumothorax and advised pt to go to ER immediately if these symptoms develop advise them of dry needling treatment. Extensive time spent with pt to ensure full understanding of TDN risks. Pt provided verbal consent to treatment. TDN performed to  R iliocostalis lumborum at L4-5 and bilateral gluteus maximus with 0.30 x 70 single needle placements with local twitch response (LTR). Pistoning technique utilized. Improved pain-free motion following intervention.     Therapeutic Exercise - for improved soft tissue flexibility and extensibility as needed for ROM, improved strength as needed to improve performance of CKC activities/functional movements   *Completed update for Goals #3 and #4  NuStep; Level 2, x 5 minutes - for improved soft tissue mobility and increased tissue temperature to improve muscle performance   -subjective gathered during this time   Lower trunk rotation; 1x10 alternating R/L, 2 sec hold  Hip ABD and ADD isometrics (belt fastened above knees); x10, 10 sec each -for analgesic effect and graded loading to improve tolerance of loading affected tissues   Box Lift from raised table to adjacent shelf; x 2 at 10-lbs and 8-lbs    PATIENT EDUCATION: Discussed current progress with PT and goals of PT moving forward. We reviewed body mechanics to reduce load on lumbar spine prn with household lifting tasks.     *next  visit* Supine pelvic tilt; 2x10   *not today* 3-way rollout with Blue physioball (anterior and anterolateral R/L), in sitting; x5 ea dir  Piriformis stretch, bilat LE today; x20; 1 sec hold PPT with Bridge; 2x6 Active hamstrings stretch (sciatic floss technique); x20 bilat LE exercise stopped momentarily prn Seated pelvic tilt; 2x10 Posterior pelvic tilt; 2x10  -PT demo and cueing for technique Bow and arrow open book; 1x10 on each side    PATIENT EDUCATION:  Education details: see above for patient education details Person educated: Patient Education method: Explanation, Demonstration, and Handouts Education comprehension: verbalized understanding and returned demonstration   HOME EXERCISE PROGRAM:  Access Code: [email or web address] URL: [email or web address] Date: 10/03/2022 Prepared by: Consuela Mimes  Exercises - Supine Lower Trunk Rotation  -  2 x daily - 7 x weekly - 2 sets - 10 reps - 1-2sec hold - Supine Piriformis Stretch with Foot on Ground  - 2 x daily - 7 x weekly - 2 sets - 10 reps - 1-2sec hold - Sidelying Bow and Arrow Stretch  - 2 x daily - 7 x weekly - 2 sets - 10 reps - 2-3 sec hold - Supine Posterior Pelvic Tilt  - 2 x daily - 7 x weekly - 2 sets - 10 reps - Supine Hamstring Stretch  - 2 x daily - 7 x weekly - 2 sets - 10 reps - 1-2sec hold   ASSESSMENT:  CLINICAL IMPRESSION: Goals # 3 and # 4 were updated today with pt demonstrating modest gains with thoracolumbar AROM with mild reproduction of symptoms. Pt is limited with functional lifting task, though keeping load close to her trunk/COM does improve tolerance. We discussed body mechanics principles to improve tolerance of household lifting tasks. We reviewed concepts of pain neuroscience education and role of PT in reducing pain alarm with graded movement, education, and strategies for pain reduction prn to improve patient's ability to maintain regular activity and ADLs. Pt's progress has been fair to  date, though she still has episodic symptoms that can limit her functional mobility and capacity to perform lifting and ADLs. Pt has remaining deficits in: decreased thoracolumbar AROM, postural changes, decreased strength, gait changes, taut/tender R>L lumbar paraspinals. Pt will continue to benefit from skilled PT services to address deficits and improve function.   OBJECTIVE IMPAIRMENTS: Abnormal gait, decreased balance, decreased mobility, difficulty walking, decreased ROM, decreased strength, hypomobility, impaired flexibility, postural dysfunction, and pain.   ACTIVITY LIMITATIONS: carrying, lifting, bending, sleeping, transfers, and bed mobility  PARTICIPATION LIMITATIONS: meal prep, cleaning, laundry, driving, and community activity  PERSONAL FACTORS: Age, Past/current experiences, Time since onset of injury/illness/exacerbation, and 1-2 comorbidities: HTN, Type 2 DM  are also affecting patient's functional outcome.   REHAB POTENTIAL: Good  CLINICAL DECISION MAKING: Evolving/moderate complexity  EVALUATION COMPLEXITY: Moderate   GOALS: Goals reviewed with patient? Yes  SHORT TERM GOALS: Target date: 10/19/2022  Pt will be independent with HEP in order to improve strength and decrease back pain to improve pain-free function at home and work. Baseline: 09/26/22: Baseline HEP initiated.    10/31/22: Pt is usually compliant, she missed a couple of days due to heightened pain.  Goal status: IN PROGRESS   LONG TERM GOALS: Target date: 11/09/2022  Pt will increase FOTO to at least 50 to demonstrate significant improvement in function at home and work related to back pain  Baseline: 09/26/22: 40.   10/31/22: 43/50 Goal status: IN PROGRESS   2.  Pt will decrease worst back pain by at least 2 points on the NPRS in order to demonstrate clinically significant reduction in back pain. Baseline: 09/26/22: Pain 10/10 at worst.   10/31/22: 10/10 at worst.  Goal status: NOT MET  3.  Patient  will have thoracolumbar AROM up to 75% or greater for all motions without reproduction of pain as needed for reaching items on ground, household chores, bending.     Baseline: 09/26/22: Motion loss in all planes, pain with flexion/extension and bilateral lateral flexion.   11/05/22:  Modest improved tolerance of flexion, L lateral flexion, and bilat rotation; remaining motion deficits and pain with closing on R side.  Goal status: IN PROGRESS/ON-GOING  4.  Pt will complete simulated lifting task with moving weighted items from table to adjacent shelf at  shoulder/chest level indicative of improved ability to move household objects to complete chores Baseline: 09/26/22: Pain with lifting even light-to-moderate weights intermittently.   11/05/22: Completed work simulation task with 10-lb box; pt feels that she is notably challenged with box with 10-lb load if load is not directly against her trunk Goal status: NOT MET    PLAN: PT FREQUENCY: 1-2x/week  PT DURATION: 4 weeks  PLANNED INTERVENTIONS: Therapeutic exercises, Therapeutic activity, Neuromuscular re-education, Balance training, Gait training, Patient/Family education, Self Care, Joint mobilization, Joint manipulation, Vestibular training, Canalith repositioning, Orthotic/Fit training, DME instructions, Dry Needling, Electrical stimulation, Spinal manipulation, Spinal mobilization, Cryotherapy, Moist heat, Taping, Traction, Ultrasound, Ionotophoresis 4mg /ml Dexamethasone, Manual therapy, and Re-evaluation.  PLAN FOR NEXT SESSION: Manual therapy for soft tissue sensitivity of lumbar paraspinals and gluteal mm. Progress with graded motion and isometrics for analgesic effect as tolerated. Update HEP as needed for successive visits. Continue with PNE concepts to reduce threat related to persistent pain.     Consuela Mimes, PT, DPT #[id number]  Gertie Exon, PT 11/05/2022, 8:59 AM

## 2022-11-07 ENCOUNTER — Ambulatory Visit: Payer: Medicare HMO | Admitting: Physical Therapy

## 2022-11-07 DIAGNOSIS — G8929 Other chronic pain: Secondary | ICD-10-CM

## 2022-11-07 DIAGNOSIS — M545 Low back pain, unspecified: Secondary | ICD-10-CM | POA: Diagnosis not present

## 2022-11-07 DIAGNOSIS — M6281 Muscle weakness (generalized): Secondary | ICD-10-CM

## 2022-11-07 DIAGNOSIS — M25551 Pain in right hip: Secondary | ICD-10-CM

## 2022-11-07 NOTE — Therapy (Signed)
OUTPATIENT PHYSICAL THERAPY TREATMENT/GOAL UPDATE   Patient Name: Jacqueline Arroyo MRN: [id number] DOB:1940/04/29, 82 y.o., female Today's Date: 11/07/2022  END OF SESSION:  PT End of Session - 11/07/22 0842     Visit Number 12    Number of Visits 13    Date for PT Re-Evaluation 11/08/22    Authorization Type [employer]    PT Start Time 0855    PT Stop Time 0937    PT Time Calculation (min) 42 min    Equipment Utilized During Treatment --   pt utilizes personal SPC to negotiate office   Activity Tolerance Patient limited by pain;Patient tolerated treatment well    Behavior During Therapy [facility] for tasks assessed/performed                Past Medical History:  Diagnosis Date   Diabetes mellitus without complication (HCC)    Hypertension    Past Surgical History:  Procedure Laterality Date   ABDOMINAL HYSTERECTOMY     BACK SURGERY     CHOLECYSTECTOMY     FOOT SURGERY     There are no problems to display for this patient.   PCP: [facility]  REFERRING PROVIDER: [facility]  REFERRING DIAG:  M54.50 (ICD-10-CM) - Low back pain, unspecified  G89.29 (ICD-10-CM) - Other chronic pain    RATIONALE FOR EVALUATION AND TREATMENT: Rehabilitation  THERAPY DIAG: Chronic bilateral low back pain without sciatica  Bilateral hip pain  Muscle weakness (generalized)  ONSET DATE: 07/15/22 (most recent flare-up resulting in visit to [facility] ED)  FOLLOW-UP APPT SCHEDULED WITH REFERRING PROVIDER: None currently on schedule  PERTINENT HISTORY: Patient is an 82 year old female referred for chronic low back pain; 20+ years of pain with prior episodes of PT and lumbar spine injections. Pt reports being shaky in the morning and having intermittent tremor. Hx of kyphoplasty in midback per patient for compression fracture. Patient reports pain goes from one hip to the other. Pt reports pain down into R thigh. Back symptoms described as soreness at rest. Pt had to use Tramadol  for significant breakthrough pain. No recent sciatica. Some sensation of "swelling" in R lateral thigh. 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   PAIN:    Pain Intensity: Present: 4-5/10, Best: 4/10, Worst: 10/10 Pain location: Across waist and bilateral gluteal region Pain Quality:  sore/tender   Radiating: Yes , to bilat glutes, bilateral flank region  Numbness/Tingling: Yes; seldom numbness in L shin  Focal Weakness: No Aggravating factors: Lifting (sometimes even bottle of water), lying on R side/sometimes on L Relieving factors: Meloxicam/Bengay/Tylenol; Tramadol prn; lying on back and stretching out in bed; lying down; sitting  24-hour pain behavior: sometimes worse in AM, sometimes bad all day long   History of prior back injury, pain, surgery, or therapy: Yes; prior PT, injections, chronic back pain   Imaging: Yes ;  EXAM: XR LUMBAR SPINE AP LATERAL AND OBLIQUES DATE: 06/25/2022 1:14 PM ACCESSION: [id number] UN DICTATED: 06/25/2022 1:16 PM INTERPRETATION LOCATION: [facility]  CLINICAL INDICATION: 82 years old Female with lower back pain    COMPARISON: CT abdomen pelvis 03/15/2022.  TECHNIQUE: AP and oblique views of the lumbar spine. A lateral view is not provided  FINDINGS: No acute fractures. Sequela of T11 and T12 vertebral augmentation.  Dextrocurvature of the lumbar spine. No anterior or retrolisthesis on oblique views. Disc narrowing, endplate sclerosis, and osteophytosis greatest at L4-L5 and L5-S1. Facet joint arthropathy greatest at L4-L5 and L5-S1.  Vascular  calcifications.    Red flags: Negative for bowel/bladder changes, saddle paresthesia, personal history of cancer, h/o spinal tumors, h/o compression fx, h/o abdominal aneurysm, abdominal pain, chills/fever, night sweats, nausea, vomiting, unrelenting pain, first onset of insidious LBP <20 y/o  PRECAUTIONS: None  WEIGHT BEARING RESTRICTIONS: No  FALLS: Has patient fallen in last 6 months?  No  Living Environment Lives with: lives alone Lives in: Apartment Stairs: No, floor-level apartment  Has following equipment at home: Single point cane and Environmental consultant - 4 wheeled  Prior level of function: Independent with community mobility with device  Hobbies: Walking exercise   Patient Goals:  "Get out of pain"    OBJECTIVE:   Gross Musculoskeletal Assessment Tremor: None Bulk: Normal Tone: Normal No visible step-off along spinal column Dextrocurvature of the lumbar spine   GAIT: Distance walked: 40 ft  Assistive device utilized: Single point cane Level of assistance: Modified independence Comments: Dec stance time, ipslateral sidebend with each step, significant calcaneal valgus R>L and dynamic genu valgum; pes planus   Posture: Lumbar lordosis: WNL Increased thoracic kyphosis, rounded shoulders Iliac crest height: Equal bilaterally Lumbar lateral shift: Negative   AROM AROM (Normal range in degrees) AROM  09/26/22 AROM 11/05/22  Lumbar    Flexion (65) 75%* 75% ("little sore")  Extension (30) 50%* 50%* (pain across waist)  Right lateral flexion (25) 75%* (pain L flank) 75%* (pain R flank)  Left lateral flexion (25) 75* (mild pain L glute) 75%  Right rotation (30) 50% 75%* (mild pain, hips)  Left rotation (30) 50% 75%* (mild pain, hips)       Hip Right Left   Flexion (125) WNL WNL   Extension (15)     Abduction (40) 40 ("pull") 40   Adduction      Internal Rotation (45) 15 15   External Rotation (45) WNL WNL        (* = pain; Blank rows = not tested)     LE MMT: MMT (out of 5) Right 10/01/22 Left 10/01/22  Hip flexion 4- 4+  Hip extension    Hip abduction (seated) 5 5  Hip adduction (seated) 5 5  Hip internal rotation    Hip external rotation    Knee flexion 4+ 4+  Knee extension 4+ 5  Ankle dorsiflexion 4 5  Ankle plantarflexion    Ankle inversion    Ankle eversion    (* = pain; Blank rows = not tested)  Sensation Grossly intact to light  touch throughout bilateral LEs as determined by testing dermatomes L2-S2. Proprioception, stereognosis, and hot/cold testing deferred on this date.  Reflexes R/L Knee Jerk (L3/4): 2+/2+  Ankle Jerk (S1/2): Unable to obtain  Muscle Length Hamstrings: R: Positive L: Positive [place] (quadriceps): R: Not examined L: Not examined   Palpation Location Right Left         Lumbar paraspinals 2 2  Quadratus Lumborum    Gluteus Maximus 1 1  Gluteus Medius 1 1  Deep hip external rotators    PSIS 1 1  Fortin's Area (SIJ)    Greater Trochanter    (Blank rows = not tested) Graded on 0-4 scale (0 = no pain, 1 = pain, 2 = pain with wincing/grimacing/flinching, 3 = pain with withdrawal, 4 = unwilling to allow palpation)  Passive Accessory Intervertebral Motion Pt has reproduction of back pain with CPA L1-L5. Generally, hypomobile throughout mid to lower lumbar spine.   Special Tests Lumbar Radiculopathy and Discogenic: Centralization and Peripheralization (SN 92, -LR  0.12): Not examined Slump (SN 83, -LR 0.32): R: Negative L: Negative SLR (SN 92, -LR 0.29): R: Negative L:  Negative  Facet Joint: Extension-Rotation (SN 100, -LR 0.0): R: Positive L: Positive  Lumbar Foraminal Stenosis: Lumbar quadrant (SN 70): R: Negative L: Negative  Hip: FABER (SN 81): R: Positive L: Positive Hip scour (SN 50): R: Negative L: Negative      TODAY'S TREATMENT: DATE: 11/07/2022    SUBJECTIVE STATEMENT:   Pt reports pain is mild at arrival, 3-4/10. She feels that pain will likely always be there on/off based on her prior experience. 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 She feels that PT has helped and states "I am a lot better than I was." Pt feels she can likely continue with more home-based exercise versus in-clinic PT visits.     MHP (unbilled) utilized during active warm-up on NuStep for analgesic effect and improved soft tissue extensibility; x 5 minutes      Therapeutic Exercise - for improved soft tissue flexibility and extensibility as needed for ROM, improved strength as needed to improve performance of CKC activities/functional movements   NuStep; Level 2, x 5 minutes - for improved soft tissue mobility and increased tissue temperature to improve muscle performance   -subjective gathered during this time  Lower trunk rotation; 1x10 alternating R/L, 2 sec hold  Hip ABD and ADD isometrics (belt fastened above knees); x10, 10 sec each -for analgesic effect and graded loading to improve tolerance of loading affected tissues   Supine pelvic tilt; 2x10  PPT with Bridge; 2x6  PPT with alternating LE hip flexion; 2x10 alternating R/L  Sit to stand; 1x10  PATIENT EDUCATION: Discussed current progress with PT and discussed continued regular visits or trial of HEP versus discontinuing PT today. Pt agrees with plan to continue with home exercise program and have one-time follow-up to ensure no regression in pt condition.     *not today* Box Lift from raised table to adjacent shelf; x 2 at 10-lbs and 8-lbs  3-way rollout with Blue physioball (anterior and anterolateral R/L), in sitting; x5 ea dir  Piriformis stretch, bilat LE today; x20; 1 sec hold Active hamstrings stretch (sciatic floss technique); x20 bilat LE exercise stopped momentarily prn Seated pelvic tilt; 2x10 Posterior pelvic tilt; 2x10  -PT demo and cueing for technique Bow and arrow open book; 1x10 on each side    PATIENT EDUCATION:  Education details: see above for patient education details Person educated: Patient Education method: Explanation, Demonstration, and Handouts Education comprehension: verbalized understanding and returned demonstration   HOME EXERCISE PROGRAM:  Access Code: [email or web address] URL: [email or web address] Date: 10/03/2022 Prepared by: Consuela Mimes  Exercises - Supine Lower Trunk Rotation  - 2 x daily - 7 x weekly - 2 sets -  10 reps - 1-2sec hold - Supine Piriformis Stretch with Foot on Ground  - 2 x daily - 7 x weekly - 2 sets - 10 reps - 1-2sec hold - Sidelying Bow and Arrow Stretch  - 2 x daily - 7 x weekly - 2 sets - 10 reps - 2-3 sec hold - Supine Posterior Pelvic Tilt  - 2 x daily - 7 x weekly - 2 sets - 10 reps - Supine Hamstring Stretch  - 2 x daily - 7 x weekly - 2 sets - 10 reps - 1-2sec hold   ASSESSMENT:  CLINICAL IMPRESSION: Patient has mild symptoms today and reports no remarkable pain following use of moist heat  and isometric exercises. Objective progress is mixed based on recent goal update, but pt feels that she has improved and that PT has been beneficial. Today was last visit scheduled; we discussed current POC and decided on continuing with trial of HEP with one-time f/u in 2 weeks.  Pt has remaining deficits in: decreased thoracolumbar AROM, postural changes, decreased strength, gait changes, taut/tender R>L lumbar paraspinals. Pt will continue to benefit from skilled PT services to address deficits and improve function.   OBJECTIVE IMPAIRMENTS: Abnormal gait, decreased balance, decreased mobility, difficulty walking, decreased ROM, decreased strength, hypomobility, impaired flexibility, postural dysfunction, and pain.   ACTIVITY LIMITATIONS: carrying, lifting, bending, sleeping, transfers, and bed mobility  PARTICIPATION LIMITATIONS: meal prep, cleaning, laundry, driving, and community activity  PERSONAL FACTORS: Age, Past/current experiences, Time since onset of injury/illness/exacerbation, and 1-2 comorbidities: HTN, Type 2 DM  are also affecting patient's functional outcome.   REHAB POTENTIAL: Good  CLINICAL DECISION MAKING: Evolving/moderate complexity  EVALUATION COMPLEXITY: Moderate   GOALS: Goals reviewed with patient? Yes  SHORT TERM GOALS: Target date: 10/19/2022  Pt will be independent with HEP in order to improve strength and decrease back pain to improve pain-free  function at home and work. Baseline: 09/26/22: Baseline HEP initiated.    10/31/22: Pt is usually compliant, she missed a couple of days due to heightened pain.  Goal status: IN PROGRESS   LONG TERM GOALS: Target date: 11/09/2022  Pt will increase FOTO to at least 50 to demonstrate significant improvement in function at home and work related to back pain  Baseline: 09/26/22: 40.   10/31/22: 43/50 Goal status: IN PROGRESS   2.  Pt will decrease worst back pain by at least 2 points on the NPRS in order to demonstrate clinically significant reduction in back pain. Baseline: 09/26/22: Pain 10/10 at worst.   10/31/22: 10/10 at worst.  Goal status: NOT MET  3.  Patient will have thoracolumbar AROM up to 75% or greater for all motions without reproduction of pain as needed for reaching items on ground, household chores, bending.     Baseline: 09/26/22: Motion loss in all planes, pain with flexion/extension and bilateral lateral flexion.   11/05/22:  Modest improved tolerance of flexion, L lateral flexion, and bilat rotation; remaining motion deficits and pain with closing on R side.  Goal status: IN PROGRESS/ON-GOING  4.  Pt will complete simulated lifting task with moving weighted items from table to adjacent shelf at shoulder/chest level indicative of improved ability to move household objects to complete chores Baseline: 09/26/22: Pain with lifting even light-to-moderate weights intermittently.   11/05/22: Completed work simulation task with 10-lb box; pt feels that she is notably challenged with box with 10-lb load if load is not directly against her trunk.  Goal status: NOT MET    PLAN: PT FREQUENCY: 1-2x/week  PT DURATION: 4 weeks  PLANNED INTERVENTIONS: Therapeutic exercises, Therapeutic activity, Neuromuscular re-education, Balance training, Gait training, Patient/Family education, Self Care, Joint mobilization, Joint manipulation, Vestibular training, Canalith repositioning, Orthotic/Fit  training, DME instructions, Dry Needling, Electrical stimulation, Spinal manipulation, Spinal mobilization, Cryotherapy, Moist heat, Taping, Traction, Ultrasound, Ionotophoresis 4mg /ml Dexamethasone, Manual therapy, and Re-evaluation.  PLAN FOR NEXT SESSION: Manual therapy for soft tissue sensitivity of lumbar paraspinals and gluteal mm. Progress with graded motion and isometrics for analgesic effect as tolerated. Update HEP as needed for successive visits. Continue with PNE concepts to reduce threat related to persistent pain.  F/u in 2 weeks to ensure pt is doing well with independent  HEP.     Consuela Mimes, PT, DPT #[id number]  Gertie Exon, PT 11/07/2022, 9:38 AM

## 2022-11-12 ENCOUNTER — Encounter: Payer: Medicare HMO | Admitting: Physical Therapy

## 2022-11-14 ENCOUNTER — Encounter: Payer: Medicare HMO | Admitting: Physical Therapy

## 2022-11-19 DIAGNOSIS — E118 Type 2 diabetes mellitus with unspecified complications: Principal | ICD-10-CM

## 2022-11-19 DIAGNOSIS — J4520 Mild intermittent asthma, uncomplicated: Principal | ICD-10-CM

## 2022-11-19 MED ORDER — METFORMIN 500 MG TABLET
ORAL_TABLET | 1 refills | 0 days | Status: CP
Start: 2022-11-19 — End: 2023-11-19

## 2022-11-19 MED ORDER — ALBUTEROL SULFATE HFA 90 MCG/ACTUATION AEROSOL INHALER
Freq: Four times a day (QID) | RESPIRATORY_TRACT | 2 refills | 0 days | Status: CP | PRN
Start: 2022-11-19 — End: 2023-11-19

## 2022-11-21 ENCOUNTER — Encounter: Payer: Self-pay | Admitting: Physical Therapy

## 2022-11-21 ENCOUNTER — Ambulatory Visit: Payer: Medicare HMO | Admitting: Physical Therapy

## 2022-11-21 DIAGNOSIS — M25552 Pain in left hip: Secondary | ICD-10-CM

## 2022-11-21 DIAGNOSIS — M6281 Muscle weakness (generalized): Secondary | ICD-10-CM

## 2022-11-21 DIAGNOSIS — G8929 Other chronic pain: Secondary | ICD-10-CM

## 2022-11-21 DIAGNOSIS — M545 Low back pain, unspecified: Secondary | ICD-10-CM | POA: Diagnosis not present

## 2022-11-21 NOTE — Therapy (Signed)
OUTPATIENT PHYSICAL THERAPY TREATMENT/GOAL UPDATE AND DISCHARGE   Patient Name: Jacqueline Arroyo MRN: [id number] DOB:1940/08/14, 82 y.o., female Today's Date: 11/21/2022  END OF SESSION:  PT End of Session - 11/21/22 0913     Visit Number 13    Number of Visits 13    Date for PT Re-Evaluation 11/08/22    Authorization Type [employer]    PT Start Time 0906    PT Stop Time 0946    PT Time Calculation (min) 40 min    Equipment Utilized During Treatment --   pt utilizes personal SPC to negotiate office   Activity Tolerance Patient limited by pain;Patient tolerated treatment well    Behavior During Therapy [facility] for tasks assessed/performed              Past Medical History:  Diagnosis Date   Diabetes mellitus without complication (HCC)    Hypertension    Past Surgical History:  Procedure Laterality Date   ABDOMINAL HYSTERECTOMY     BACK SURGERY     CHOLECYSTECTOMY     FOOT SURGERY     There are no problems to display for this patient.   PCP: [facility]  REFERRING PROVIDER: Carnella Guadalajara, DO  REFERRING DIAG:  M54.50 (ICD-10-CM) - Low back pain, unspecified  G89.29 (ICD-10-CM) - Other chronic pain    RATIONALE FOR EVALUATION AND TREATMENT: Rehabilitation  THERAPY DIAG: Chronic bilateral low back pain without sciatica  Bilateral hip pain  Muscle weakness (generalized)  ONSET DATE: 07/15/22 (most recent flare-up resulting in visit to [facility] ED)  FOLLOW-UP APPT SCHEDULED WITH REFERRING PROVIDER: None currently on schedule  PERTINENT HISTORY: Patient is an 82 year old female referred for chronic low back pain; 20+ years of pain with prior episodes of PT and lumbar spine injections. Pt reports being shaky in the morning and having intermittent tremor. Hx of kyphoplasty in midback per patient for compression fracture. Patient reports pain goes from one hip to the other. Pt reports pain down into R thigh. Back symptoms described as soreness at rest. Pt had to  use Tramadol for significant breakthrough pain. No recent sciatica. Some sensation of "swelling" in R lateral thigh. 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   PAIN:    Pain Intensity: Present: 4-5/10, Best: 4/10, Worst: 10/10 Pain location: Across waist and bilateral gluteal region Pain Quality:  sore/tender   Radiating: Yes , to bilat glutes, bilateral flank region  Numbness/Tingling: Yes; seldom numbness in L shin  Focal Weakness: No Aggravating factors: Lifting (sometimes even bottle of water), lying on R side/sometimes on L Relieving factors: Meloxicam/Bengay/Tylenol; Tramadol prn; lying on back and stretching out in bed; lying down; sitting  24-hour pain behavior: sometimes worse in AM, sometimes bad all day long   History of prior back injury, pain, surgery, or therapy: Yes; prior PT, injections, chronic back pain   Imaging: Yes ;  EXAM: XR LUMBAR SPINE AP LATERAL AND OBLIQUES DATE: 06/25/2022 1:14 PM ACCESSION: [id number] UN DICTATED: 06/25/2022 1:16 PM INTERPRETATION LOCATION: [facility]  CLINICAL INDICATION: 82 years old Female with lower back pain    COMPARISON: CT abdomen pelvis 03/15/2022.  TECHNIQUE: AP and oblique views of the lumbar spine. A lateral view is not provided  FINDINGS: No acute fractures. Sequela of T11 and T12 vertebral augmentation.  Dextrocurvature of the lumbar spine. No anterior or retrolisthesis on oblique views. Disc narrowing, endplate sclerosis, and osteophytosis greatest at L4-L5 and L5-S1. Facet joint arthropathy greatest at L4-L5 and L5-S1.  Vascular  calcifications.    Red flags: Negative for bowel/bladder changes, saddle paresthesia, personal history of cancer, h/o spinal tumors, h/o compression fx, h/o abdominal aneurysm, abdominal pain, chills/fever, night sweats, nausea, vomiting, unrelenting pain, first onset of insidious LBP <20 y/o  PRECAUTIONS: None  WEIGHT BEARING RESTRICTIONS: No  FALLS: Has patient fallen in last  6 months? No  Living Environment Lives with: lives alone Lives in: Apartment Stairs: No, floor-level apartment  Has following equipment at home: Single point cane and Environmental consultant - 4 wheeled  Prior level of function: Independent with community mobility with device  Hobbies: Walking exercise   Patient Goals:  "Get out of pain"    OBJECTIVE:   Gross Musculoskeletal Assessment Tremor: None Bulk: Normal Tone: Normal No visible step-off along spinal column Dextrocurvature of the lumbar spine   GAIT: Distance walked: 40 ft  Assistive device utilized: Single point cane Level of assistance: Modified independence Comments: Dec stance time, ipslateral sidebend with each step, significant calcaneal valgus R>L and dynamic genu valgum; pes planus   Posture: Lumbar lordosis: WNL Increased thoracic kyphosis, rounded shoulders Iliac crest height: Equal bilaterally Lumbar lateral shift: Negative   AROM AROM (Normal range in degrees) AROM  09/26/22 AROM 11/05/22 AROM 11/21/22  Lumbar     Flexion (65) 75%* 75% ("little sore") 100%  Extension (30) 50%* 50%* (pain across waist) 50% ("little sore")*  Right lateral flexion (25) 75%* (pain L flank) 75%* (pain R flank) WNL ("little pull")  Left lateral flexion (25) 75* (mild pain L glute) 75% WNL ("little pull")  Right rotation (30) 50% 75%* (mild pain, hips) 75%  Left rotation (30) 50% 75%* (mild pain, hips) 75%        Hip Right Left    Flexion (125) WNL WNL    Extension (15)      Abduction (40) 40 ("pull") 40    Adduction       Internal Rotation (45) 15 15    External Rotation (45) WNL WNL          (* = pain; Blank rows = not tested)     LE MMT: MMT (out of 5) Right 10/01/22 Left 10/01/22  Hip flexion 4- 4+  Hip extension    Hip abduction (seated) 5 5  Hip adduction (seated) 5 5  Hip internal rotation    Hip external rotation    Knee flexion 4+ 4+  Knee extension 4+ 5  Ankle dorsiflexion 4 5  Ankle plantarflexion     Ankle inversion    Ankle eversion    (* = pain; Blank rows = not tested)  Sensation Grossly intact to light touch throughout bilateral LEs as determined by testing dermatomes L2-S2. Proprioception, stereognosis, and hot/cold testing deferred on this date.  Reflexes R/L Knee Jerk (L3/4): 2+/2+  Ankle Jerk (S1/2): Unable to obtain  Muscle Length Hamstrings: R: Positive L: Positive E[place] (quadriceps): R: Not examined L: Not examined   Palpation Location Right Left         Lumbar paraspinals 2 2  Quadratus Lumborum    Gluteus Maximus 1 1  Gluteus Medius 1 1  Deep hip external rotators    PSIS 1 1  Fortin's Area (SIJ)    Greater Trochanter    (Blank rows = not tested) Graded on 0-4 scale (0 = no pain, 1 = pain, 2 = pain with wincing/grimacing/flinching, 3 = pain with withdrawal, 4 = unwilling to allow palpation)  Passive Accessory Intervertebral Motion Pt has reproduction of back pain with  CPA L1-L5. Generally, hypomobile throughout mid to lower lumbar spine.   Special Tests Lumbar Radiculopathy and Discogenic: Centralization and Peripheralization (SN 92, -LR 0.12): Not examined Slump (SN 83, -LR 0.32): R: Negative L: Negative SLR (SN 92, -LR 0.29): R: Negative L:  Negative  Facet Joint: Extension-Rotation (SN 100, -LR 0.0): R: Positive L: Positive  Lumbar Foraminal Stenosis: Lumbar quadrant (SN 70): R: Negative L: Negative  Hip: FABER (SN 81): R: Positive L: Positive Hip scour (SN 50): R: Negative L: Negative      TODAY'S TREATMENT: DATE: 11/21/2022    SUBJECTIVE STATEMENT:   Pt reports no major flares over last 2 weeks. Pt reports some soreness in low back from carrying canned goods after grocery  shopping yesterday. Patient reports pain is minimal now - pt describes symptoms as "soreness" from walking around [place] and moving grocery items. Pt feels she is ready to continue with her own home exercises. She feels that she will never be 100% due to her age and  persistence of symptoms.     MHP (unbilled) utilized during active warm-up on NuStep for analgesic effect and improved soft tissue extensibility; x 5 minutes     Therapeutic Exercise - for improved soft tissue flexibility and extensibility as needed for ROM, improved strength as needed to improve performance of CKC activities/functional movements   *GOAL UPDATE PERFORMED   NuStep; Level 2, x 7 minutes - for improved soft tissue mobility and increased tissue temperature to improve muscle performance   -subjective gathered during this time  Lower trunk rotation; 1x10 alternating R/L, 2 sec hold  PPT with Bridge; 2x6  PPT with alternating LE hip flexion; 2x10 alternating R/L  Sit to stand; 2x10  PATIENT EDUCATION: Discussed current progress with PT, discharge plan. Updated and reviewed HEP.     *not today* Box Lift from raised table to adjacent shelf; x 2 at 10-lbs and 8-lbs  3-way rollout with Blue physioball (anterior and anterolateral R/L), in sitting; x5 ea dir  Piriformis stretch, bilat LE today; x20; 1 sec hold Active hamstrings stretch (sciatic floss technique); x20 bilat LE exercise stopped momentarily prn Seated pelvic tilt; 2x10 Posterior pelvic tilt; 2x10  -PT demo and cueing for technique Bow and arrow open book; 1x10 on each side    PATIENT EDUCATION:  Education details: see above for patient education details Person educated: Patient Education method: Explanation, Demonstration, and Handouts Education comprehension: verbalized understanding and returned demonstration   HOME EXERCISE PROGRAM:  Access Code: [email or web address] URL: [email or web address] Date: 11/21/2022 Prepared by: Consuela Mimes  Exercises - Supine Lower Trunk Rotation  - 2 x daily - 7 x weekly - 2 sets - 10 reps - 1-2sec hold - Supine Piriformis Stretch with Foot on Ground  - 2 x daily - 7 x weekly - 2 sets - 10 reps - 1-2sec hold - Sidelying Bow and Arrow Stretch  - 2 x daily  - 7 x weekly - 2 sets - 10 reps - 2-3 sec hold - Hooklying Isometric Hip Abduction with Belt  - 2 x daily - 7 x weekly - 2 sets - 10 reps - 5sec hold - Supine Hamstring Stretch  - 2 x daily - 7 x weekly - 2 sets - 10 reps - 1-2sec hold - Supine Bridge  - 1 x daily - 3-4 x weekly - 2 sets - 6-10 reps - Dead Bug  - 1 x daily - 3-4 x weekly - 2 sets - 10 reps - Sit to Stand  Without Arm Support  - 1 x daily - 3-4 x weekly - 2 sets - 10 reps   ASSESSMENT:  CLINICAL IMPRESSION: Patient has met FOTO goal and largely improved thoracolumbar AROM. NPRS at worst has significantly improved. Pt does have firmly held beliefs about degenerative changes and poor prognosis due to age-related changes in her spine. We spent ample time informing patient of normal age-related changes in spine and that pain is not always indicator of injury/harm/anatomical issue. HEP was updated today and pt was encouraged to continue with home exercises and f/u with PT if needed in future.    OBJECTIVE IMPAIRMENTS: Abnormal gait, decreased balance, decreased mobility, difficulty walking, decreased ROM, decreased strength, hypomobility, impaired flexibility, postural dysfunction, and pain.   ACTIVITY LIMITATIONS: carrying, lifting, bending, sleeping, transfers, and bed mobility  PARTICIPATION LIMITATIONS: meal prep, cleaning, laundry, driving, and community activity  PERSONAL FACTORS: Age, Past/current experiences, Time since onset of injury/illness/exacerbation, and 1-2 comorbidities: HTN, Type 2 DM  are also affecting patient's functional outcome.   REHAB POTENTIAL: Good  CLINICAL DECISION MAKING: Evolving/moderate complexity  EVALUATION COMPLEXITY: Moderate   GOALS: Goals reviewed with patient? Yes  SHORT TERM GOALS: Target date: 10/19/2022  Pt will be independent with HEP in order to improve strength and decrease back pain to improve pain-free function at home and work. Baseline: 09/26/22: Baseline HEP initiated.     10/31/22: Pt is usually compliant, she missed a couple of days due to heightened pain.   11/21/22: Pt reports 1-2 days during which she had bad allergies and didn't do as much activity Goal status: MOSTLY MET    LONG TERM GOALS: Target date: 11/09/2022  Pt will increase FOTO to at least 50 to demonstrate significant improvement in function at home and work related to back pain  Baseline: 09/26/22: 40.   10/31/22: 43/50   11/21/22: 62/50 Goal status: ACHIEVED   2.  Pt will decrease worst back pain by at least 2 points on the NPRS in order to demonstrate clinically significant reduction in back pain. Baseline: 09/26/22: Pain 10/10 at worst.   10/31/22: 10/10 at worst.  11/21/22: 5-6/10 at worst.  Goal status: ACHIEVED  3.  Patient will have thoracolumbar AROM up to 75% or greater for all motions without reproduction of pain as needed for reaching items on ground, household chores, bending.     Baseline: 09/26/22: Motion loss in all planes, pain with flexion/extension and bilateral lateral flexion.   11/05/22:  Modest improved tolerance of flexion, L lateral flexion, and bilat rotation; remaining motion deficits and pain with closing on R side.   11/21/22: Up to 75% for all directions with exception of extension, pain with extension primarily Goal status: MOSTLY MET  4.  Pt will complete simulated lifting task with moving weighted items from table to adjacent shelf at shoulder/chest level indicative of improved ability to move household objects to complete chores Baseline: 09/26/22: Pain with lifting even light-to-moderate weights intermittently.   11/05/22: Completed work simulation task with 10-lb box; pt feels that she is notably challenged with box with 10-lb load if load is not directly against her trunk.  Goal status: NOT MET    PLAN: PT FREQUENCY: -  PT DURATION: -  PLANNED INTERVENTIONS: Therapeutic exercises, Therapeutic activity, Neuromuscular re-education, Balance training, Gait  training, Patient/Family education, Self Care, Joint mobilization, Joint manipulation, Vestibular training, Canalith repositioning, Orthotic/Fit training, DME instructions, Dry Needling, Electrical stimulation, Spinal manipulation, Spinal mobilization, Cryotherapy, Moist heat, Taping, Traction, Ultrasound, Ionotophoresis 4mg /ml Dexamethasone, Manual  therapy, and Re-evaluation.  PLAN FOR NEXT SESSION: Pt to continue with independent HEP.    Consuela Mimes, PT, DPT #[id number]  Gertie Exon, PT 11/21/2022, 9:13 AM

## 2022-12-19 ENCOUNTER — Ambulatory Visit: Admit: 2022-12-19 | Discharge: 2022-12-20 | Payer: MEDICARE | Attending: Adult Health | Primary: Adult Health

## 2022-12-19 ENCOUNTER — Other Ambulatory Visit: Admit: 2022-12-19 | Discharge: 2022-12-20 | Payer: MEDICARE

## 2022-12-19 DIAGNOSIS — R300 Dysuria: Principal | ICD-10-CM

## 2022-12-19 DIAGNOSIS — D751 Secondary polycythemia: Principal | ICD-10-CM

## 2022-12-19 LAB — URINALYSIS WITH MICROSCOPY WITH CULTURE REFLEX PERFORMABLE
BACTERIA: NONE SEEN /HPF
BILIRUBIN UA: NEGATIVE
BLOOD UA: NEGATIVE
GLUCOSE UA: NEGATIVE
KETONES UA: NEGATIVE
LEUKOCYTE ESTERASE UA: NEGATIVE
NITRITE UA: NEGATIVE
PH UA: 5.5 (ref 5.0–9.0)
PROTEIN UA: NEGATIVE
RBC UA: 1 /HPF (ref <=4)
SPECIFIC GRAVITY UA: 1.023 (ref 1.003–1.030)
SQUAMOUS EPITHELIAL: <1 /HPF (ref 0–5)
UROBILINOGEN UA: <2
WBC UA: <1 /HPF (ref 0–5)

## 2022-12-19 LAB — CBC W/ AUTO DIFF
BASOPHILS ABSOLUTE COUNT: 0 10*9/L (ref 0.0–0.1)
BASOPHILS RELATIVE PERCENT: 0.6 %
EOSINOPHILS ABSOLUTE COUNT: 0 10*9/L (ref 0.0–0.5)
EOSINOPHILS RELATIVE PERCENT: 0.3 %
HEMATOCRIT: 38.1 % (ref 34.0–44.0)
HEMOGLOBIN: 12.8 g/dL (ref 11.3–14.9)
LYMPHOCYTES ABSOLUTE COUNT: 0.9 10*9/L — ABNORMAL LOW (ref 1.1–3.6)
LYMPHOCYTES RELATIVE PERCENT: 33.9 %
MEAN CORPUSCULAR HEMOGLOBIN CONC: 33.5 g/dL (ref 32.0–36.0)
MEAN CORPUSCULAR HEMOGLOBIN: 37.4 pg — ABNORMAL HIGH (ref 25.9–32.4)
MEAN CORPUSCULAR VOLUME: 111.7 fL — ABNORMAL HIGH (ref 77.6–95.7)
MEAN PLATELET VOLUME: 8.3 fL (ref 6.8–10.7)
MONOCYTES ABSOLUTE COUNT: 0.3 10*9/L (ref 0.3–0.8)
MONOCYTES RELATIVE PERCENT: 11.9 %
NEUTROPHILS ABSOLUTE COUNT: 1.4 10*9/L — ABNORMAL LOW (ref 1.8–7.8)
NEUTROPHILS RELATIVE PERCENT: 53.3 %
PLATELET COUNT: 173 10*9/L (ref 150–450)
RED BLOOD CELL COUNT: 3.41 10*12/L — ABNORMAL LOW (ref 3.95–5.13)
RED CELL DISTRIBUTION WIDTH: 14.5 % (ref 12.2–15.2)
WBC ADJUSTED: 2.7 10*9/L — ABNORMAL LOW (ref 3.6–11.2)

## 2022-12-19 LAB — COMPREHENSIVE METABOLIC PANEL
ALBUMIN: 4.1 g/dL (ref 3.4–5.0)
ALKALINE PHOSPHATASE: 34 U/L — ABNORMAL LOW (ref 46–116)
ALT (SGPT): 21 U/L (ref 10–49)
ANION GAP: 10 mmol/L (ref 5–14)
AST (SGOT): 22 U/L (ref <=34)
BILIRUBIN TOTAL: 0.4 mg/dL (ref 0.3–1.2)
BLOOD UREA NITROGEN: 30 mg/dL — ABNORMAL HIGH (ref 9–23)
BUN / CREAT RATIO: 28
CALCIUM: 10 mg/dL (ref 8.7–10.4)
CHLORIDE: 110 mmol/L — ABNORMAL HIGH (ref 98–107)
CO2: 24 mmol/L (ref 20.0–31.0)
CREATININE: 1.06 mg/dL — ABNORMAL HIGH (ref 0.55–1.02)
EGFR CKD-EPI (2021) FEMALE: 52 mL/min/{1.73_m2} — ABNORMAL LOW (ref >=60)
GLUCOSE RANDOM: 133 mg/dL (ref 70–179)
POTASSIUM: 5.3 mmol/L — ABNORMAL HIGH (ref 3.4–4.8)
PROTEIN TOTAL: 7 g/dL (ref 5.7–8.2)
SODIUM: 144 mmol/L (ref 135–145)

## 2022-12-19 LAB — LACTATE DEHYDROGENASE: LACTATE DEHYDROGENASE: 189 U/L (ref 120–246)

## 2022-12-19 MED ORDER — HYDROXYUREA 500 MG CAPSULE
ORAL_CAPSULE | 3 refills | 0 days | Status: CP
Start: 2022-12-19 — End: 2022-12-19
  Filled 2023-01-01: qty 132, 84d supply, fill #0

## 2022-12-19 NOTE — Unmapped (Signed)
ID:  Cindy Gonzalez is an 82 y.o. with PCV    DZ CHAR:   5.6/17.8/55.8/243; K 5.3; LDH: 248  Epo < 1  Smear: No immature forms; nl RBC morphology; smudge cells; reactive lymph   Ferritin: 27.5  LDH: 399  No h/o thrombosis.     Variants of Known/Likely Clinical Significance:   Gene Coding Predicted Protein Variant allele fraction   IDH2 c.419G>A p.(Arg140Gln) 26.7%   JAK2 c.1849G>T p.(Val617Phe) 69.8%   TET2 c.4045-2A>G p.(?) 13.4%      ASSESSMENT:   Cindy Gonzalez is an 82 y.o. w/ a h/o erythrocytosis.  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  A bone marrow biopsy is not needed to make the diagnosis though it may offer additional prognostic information.  She also has an IDH2 and TET2 mutation, conveying worse prognosis.    Cindy. Gonzalez is doing well.  She continues to tolerate her current dose of HU and her counts look very good. She is complaining of an overall feeling of malaise; will check urine today for UTI as they can present differently in seniors even though asymptomatic.  This was negative; will have patient f/u with PCP.       PLAN:  1) Return to clinic in three months.   2) Continue on Hydrea 500 mg.   1 tablet BID on Mon - Thur  1 tablet every day on Fri - Sun  3) Continue low dose aspirin  4) UA today    Markus Jarvis, RN, MSN, AGPCNP-C  Nurse Practitioner  Hematologic Malignancies  [facility]  12/19/2022    I personally spent 40 minutes face-to-face and non-face-to-face in the care of this patient, which includes all pre, intra, and post visit time on the date of service. This time was spent in reviewing notes, labs and other test results in the chart, speaking with and examining the patient, communicating with other medical teams and documentation of the clinical encounter. All documented time was specific to the E/M visit and does not include any procedures that may have been performed.        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    04/2020: 3.2/13.7/41.1/225; ANC: 1.9; ALC: 1.0  02/2021: CTA: Negative; B12: 1,251  04/2021: TTE: EF 35-40%; 5.5/14.6/44.3/268; LDH: 234  10/14/21: 8.0/17.7/55.4/265; AMC: 0.1; ALC: 0.5; K: 5.0  10/19/21: JAK2: 64.9; Epo < 1  11/08/21: 5.6/17.8/55.8/243; K 5.3; LDH: 248  02/06/22: Seen by [facility]  7.8/19.5/59.7/262  Smear: No immature forms; nl RBC morphology; smudge cells; reactive lymph   Myeloid mutation panel: IDH2, JAK2, TET2  Ferritin: 27.5  LDH: 399  02/14/22: Started Hydroxyurea 3.5 g/wk  6.5/18.8/58.4/236; MCV: 84.1  02/23/22: Therapeutic phlebotomy   5.9/19.5/60.6/261  Ferritin: 25.3  02/26/22: 6.3/18.4/57.2/262; Hydrea 5.5 g/wk  03/09/22: Phlebotomy;   4.3/17.8/.55.4/153;  Ferritin: 18.3  03/17/22: 5.0/17.7/54.9/114; MCV: 87.5  04/10/22:Phlebotomy; 3.0/17.2/52.2/145; Hydrea 7 g/wk;   MCV: 90.0  ferritin: 265.5  06/12/22: 2.7/11.8/34.2/115; ANC 1.5: Hydrea reduced 6 g/wk   07/24/22: 2.4/12.5/37.0/141, ANC 1.3; Hydrea reduced to 5.5g/wk  09/18/22: 3.0/13.5/160; ANC: 1.8  12/19/22: 2.7/12.8/173    INTERVAL HX:  Cindy Gonzalez comes for FU of her PCV. We  discussed the following:  Adherence: Does occasionally miss a dose, but is overall   Tolerance: She denies any mouth sores, rashes, or GI symptoms.    MPN Sx: No Aq pruritus, concentration problems, deep bone pain, early satiety   PS: She continues to be active; she shops, does her chores, and participates in bible study.   Does note that she hasn't been feeling well the past few days  ID: No C/UR sx, had a low grade fever yesterday 99   Heme: No bleeding sx   B sx: Minor NS, no change in wt, no F     PHYSICAL EXAM:  VS: As recorded in Epic  General: Resting, in no apparent distress  HEENT:  No scleral icterus or conjunctival injection.  CV:  RRR.  S1, S2.  No murmurs, gallops or rubs.  Resp:  Breathing is unlabored, CTAB.   GI:  No distention or pain on palpation.  Bowel sounds are present.  No palpable splenomegaly.    Psychiatric:  Alert and oriented to person, place, time and situation.  Range of affect is appropriate.    Neurologic:  CN II-XII are normal and symmetric. Gait is normal.   Extremities:  Appear well-perfused.  No edema      MPN 10 Score  (App: [email or web address])   [email or web address]  (Useful for monitoring patient symptoms.    Symptom Range Pre-Tx   Fatigue in the past 24 hours (Absent) 0 1 2 3 4 5 6 7 8 9  10 (Worst Imaginable) 0   Filling up quickly when you eat (Early satiety)  (Absent) 0 1 2 3 4 5 6 7 8 9  10 (Worst Imaginable) 0   Abdominal discomfort  (Absent) 0 1 2 3 4 5 6 7 8 9  10 (Worst Imaginable) 0   Inactivity (Absent) 0 1 2 3 4 5 6 7 8 9  10 (Worst Imaginable) Uses walking stick; slowed done arthritis 1   Problems with concentration - (Absent) 0 1 2 3 4 5 6 7 8 9  10 (Worst Imaginable) 0   Numbness/ Tingling (in my hands and feet) (Absent) 0 1 2 3 4 5 6 7 8 9  10 (Worst Imaginable) occ in feet 1 - 2   Night sweats (Absent) 0 1 2 3 4 5 6 7 8 9  10 (Worst Imaginable) She notes a little sweatiness with her cholesterol medicine 1   Itching (pruritus) (Absent) 0 1 2 3 4 5 6 7 8 9  10 (Worst Imaginable) 1   Bone pain (diffuse not joint pain or arthritis) (Absent) 0 1 2 3 4 5 6 7 8 9  10 (Worst Imaginable) It is difficult to separate bone from arthritis pain    Fever (>100 F) (Absent) 0 1 2 3 4 5 6 7 8 9  10 (Daily) 0   Unintentional weight loss last 6 months (Absent) 0 1 2 3 4 5 6 7 8 9  10 (Worst Imaginable) With DM meds 16 lbs      5     PMHx of pancreatitis, hepatitis, T2DM, atelectasis, acute respiratory failure with hypoxemia, nonischemic cardiomyopathy, HTN, aortic stenosis, heart failure, CAD, essential tremor, osteoarthritis, vertigo, iron deficiency anemia, insomnia, dyslipidemia, Diffuse large B cell lymphoma.      Primary prophylaxis of thrombosis  All patients: Aspirin unless    VWF activity <30%,   Platelet >?1 million,   CALR-mutated low-risk ET    PV with Hct >?45%: Add phlebotomy/cytoreduction to target Hct <?45%  Age >?60 years and/or prior  history of thrombosis: Add cytoreduction  Secondary prophylaxis after thrombotic event  All patients: Cytoreduction  Typical VTE: Consider indefinite VKA for most patients; aspirin if not on VKA  Atypical VTE: Indefinite VKA  Arterial thrombosis: Aspirin    PCV Risk  Age > 60:    1   No: 0   Yes: 1    History of thrombosis:  0   No: 0   Yes: 1    Low Risk: 0 points  High Risk 1 -2 points    Low Risk patients treat with aspirin every day;   High Risk patients treat with aspirin BID*  * Consider BID aspirin for patients with microvascular complications, additional CV risk factors, or leukocytosis    PV:   Major criteria  Hgb/Hct  Men: >?16.5?g/dL or 16% or inc red blood cell mass  Women: > 16 g/dL or 10% or inc red blood cell mass  Inc in RBC Mass > 25%  Presence of JAK2 or JAK2 exon 12 mutation   BM biopsy showing hypercellularity for age with trilineage growth (panmyelosis) including prominent erythroid, granulocytic and megakaryocytic proliferation with pleomorphic, mature megakaryocytes (differences in size, without atypia)     Minor criteria  Subnormal serum erythropoietin level     Diagnosis: All 3 major or first 2 major and 1 minor    Post-PV MF  Required criteria  Previous dx of PCV  BM Fibrosis of grade 2/3    Additional  Anemia or loss of need for cytoreduction  Leukoerythroblastosis  Splenomegaly > 5 cm from baseline or new splenomegaly  2 or more constitutional sx: > 10% wt loss in 6 mths, NS, fever > 37.5

## 2022-12-21 NOTE — Unmapped (Signed)
Clinical Assessment Needed For: Dose Change  Medication: hydroxyurea 500 mg capsule (HYDREA)  Last Fill Date/Day Supply: 09/28/22 / 88 days   Copay $0.00  Was previous dose already scheduled to fill: No    Notes to Pharmacist: Provider comments say do not fill yet

## 2022-12-31 NOTE — Unmapped (Signed)
[facility] Refill Coordination Note    Specialty Medication(s) to be Shipped:   Specialty Lite: hydroxyurea    Other medication(s) to be shipped: No additional medications requested for fill at this time     Cindy Gonzalez Gonzalez, DOB: [birth year]  Phone: [phone] (home)       All above HIPAA information was verified with patient.     Was a Nurse, learning disability used for this call? No    Completed refill call assessment today to schedule patient's medication shipment from the [facility]  [phone]).  All relevant notes have been reviewed.     Specialty medication(s) and dose(s) confirmed: Regimen is correct and unchanged.   Changes to medications: Cindy Gonzalez reports no changes at this time.  Changes to insurance: No  New side effects reported not previously addressed with a pharmacist or physician: None reported  Questions for the pharmacist: No    Confirmed patient received a Conservation officer, historic buildings and a Surveyor, mining with first shipment. The patient will receive a drug information handout for each medication shipped and additional FDA Medication Guides as required.       DISEASE/MEDICATION-SPECIFIC INFORMATION        N/A    SPECIALTY MEDICATION ADHERENCE     Medication Adherence    Patient reported X missed doses in the last month: 0  Specialty Medication: Hydroxyurea 500mg   Patient is on additional specialty medications: No  Informant: patient              Were doses missed due to medication being on hold? No      hydroxyurea 500 mg capsule (HYDREA): unknown  amount  of medicine on hand     REFERRAL TO PHARMACIST     Referral to the pharmacist: Not needed      [facility]     Shipping address confirmed in Epic.       Delivery Scheduled: Yes, Expected medication delivery date: 01/01/2023.     Medication will be delivered via Same Day Courier to the prescription address in Epic WAM.    [facility]  Specialty Technician

## 2023-01-08 DIAGNOSIS — J302 Other seasonal allergic rhinitis: Principal | ICD-10-CM

## 2023-01-08 MED ORDER — FLUTICASONE PROPIONATE 50 MCG/ACTUATION NASAL SPRAY,SUSPENSION
0 refills | 0.00 days | Status: CP
Start: 2023-01-08 — End: ?

## 2023-01-08 NOTE — Unmapped (Signed)
Patient is requesting the following refill  Requested Prescriptions     Pending Prescriptions Disp Refills    fluticasone propionate (FLONASE) 50 mcg/actuation nasal spray [Pharmacy Med Name: Fluticasone Propionate 50 MCG/ACT Nasal Suspension] 16 g 0     Sig: Use 1 spray(s) in each nostril once daily       Recent Visits  Date Type Provider Dept   10/15/22 Office Visit Mangel, [name], DO [place] [facility] [address] [facility]   08/16/22 Office Visit Jenell Milliner, MD [place] [facility] [address] [facility]   07/11/22 Office Visit Johnn Hai, Loleta [name], FNP [place] [facility] [address] [facility]   06/14/22 Office Visit Jenell Milliner, MD [place] [facility] [address] [facility]   05/18/22 Office Visit Farrug, Emogene Morgan, NP [place] [facility] [address] [facility]   03/22/22 Office Visit Johnn Hai, Loleta [name], FNP [place] [facility] [address] [facility]   03/19/22 Office Visit Mangel, [name], DO [place] [facility] [address] [facility]   03/06/22 Office Visit Jenell Milliner, MD [facility] [facility] [address] [facility]   Showing recent visits within past 365 days and meeting all other requirements  Future Appointments  Date Type Provider Dept   02/15/23 Appointment Mangel, [name], DO  [facility] [address] [facility]   Showing future appointments within next 365 days and meeting all other requirements       Labs: Not applicable this refill

## 2023-01-24 DIAGNOSIS — E785 Hyperlipidemia, unspecified: Principal | ICD-10-CM

## 2023-01-24 DIAGNOSIS — E118 Type 2 diabetes mellitus with unspecified complications: Principal | ICD-10-CM

## 2023-01-24 MED ORDER — LANCETS
2 refills | 0.00 days | Status: CP
Start: 2023-01-24 — End: 2024-01-24

## 2023-01-24 MED ORDER — ATORVASTATIN 10 MG TABLET
ORAL_TABLET | Freq: Every day | ORAL | 2 refills | 90 days | Status: CP
Start: 2023-01-24 — End: 2024-01-24

## 2023-01-27 ENCOUNTER — Ambulatory Visit: Admit: 2023-01-27 | Discharge: 2023-01-30 | Disposition: A | Discharge status: Home / Self Care | Payer: MEDICARE

## 2023-01-27 ENCOUNTER — Inpatient Hospital Stay: Admit: 2023-01-27 | Discharge: 2023-01-30 | Disposition: A | Discharge status: Home / Self Care | Payer: MEDICARE

## 2023-01-27 LAB — CBC W/ AUTO DIFF
BASOPHILS ABSOLUTE COUNT: 0 10*9/L (ref 0.0–0.1)
BASOPHILS RELATIVE PERCENT: 1.2 %
EOSINOPHILS ABSOLUTE COUNT: 0 10*9/L (ref 0.0–0.5)
EOSINOPHILS RELATIVE PERCENT: 0.1 %
HEMATOCRIT: 39.6 % (ref 34.0–44.0)
HEMOGLOBIN: 13 g/dL (ref 11.3–14.9)
LYMPHOCYTES ABSOLUTE COUNT: 0.9 10*9/L — ABNORMAL LOW (ref 1.1–3.6)
LYMPHOCYTES RELATIVE PERCENT: 29.8 %
MEAN CORPUSCULAR HEMOGLOBIN CONC: 32.9 g/dL (ref 32.0–36.0)
MEAN CORPUSCULAR HEMOGLOBIN: 37 pg — ABNORMAL HIGH (ref 25.9–32.4)
MEAN CORPUSCULAR VOLUME: 112.3 fL — ABNORMAL HIGH (ref 77.6–95.7)
MEAN PLATELET VOLUME: 8.5 fL (ref 6.8–10.7)
MONOCYTES ABSOLUTE COUNT: 0.3 10*9/L (ref 0.3–0.8)
MONOCYTES RELATIVE PERCENT: 10.7 %
NEUTROPHILS ABSOLUTE COUNT: 1.7 10*9/L — ABNORMAL LOW (ref 1.8–7.8)
NEUTROPHILS RELATIVE PERCENT: 58.2 %
NUCLEATED RED BLOOD CELLS: 0 /100{WBCs} (ref <=4)
PLATELET COUNT: 148 10*9/L — ABNORMAL LOW (ref 150–450)
RED BLOOD CELL COUNT: 3.52 10*12/L — ABNORMAL LOW (ref 3.95–5.13)
RED CELL DISTRIBUTION WIDTH: 13.7 % (ref 12.2–15.2)
WBC ADJUSTED: 2.9 10*9/L — ABNORMAL LOW (ref 3.6–11.2)

## 2023-01-27 LAB — HIGH SENSITIVITY TROPONIN I - 2 HOUR SERIAL
HIGH SENSITIVITY TROPONIN - DELTA (0-2H): 12 ng/L — ABNORMAL HIGH (ref <=7)
HIGH-SENSITIVITY TROPONIN I - 2 HOUR: 23 ng/L (ref <=34)

## 2023-01-27 LAB — COMPREHENSIVE METABOLIC PANEL
ALBUMIN: 4.1 g/dL (ref 3.4–5.0)
ALKALINE PHOSPHATASE: 32 U/L — ABNORMAL LOW (ref 46–116)
ALT (SGPT): 29 U/L (ref 10–49)
ANION GAP: 15 mmol/L — ABNORMAL HIGH (ref 5–14)
AST (SGOT): 41 U/L — ABNORMAL HIGH (ref <=34)
BILIRUBIN TOTAL: 0.4 mg/dL (ref 0.3–1.2)
BLOOD UREA NITROGEN: 26 mg/dL — ABNORMAL HIGH (ref 9–23)
BUN / CREAT RATIO: 22
CALCIUM: 10.3 mg/dL (ref 8.7–10.4)
CHLORIDE: 104 mmol/L (ref 98–107)
CO2: 21.3 mmol/L (ref 20.0–31.0)
CREATININE: 1.18 mg/dL — ABNORMAL HIGH (ref 0.55–1.02)
EGFR CKD-EPI (2021) FEMALE: 46 mL/min/{1.73_m2} — ABNORMAL LOW (ref >=60)
GLUCOSE RANDOM: 136 mg/dL (ref 70–179)
POTASSIUM: 4.4 mmol/L (ref 3.4–4.8)
PROTEIN TOTAL: 7.3 g/dL (ref 5.7–8.2)
SODIUM: 140 mmol/L (ref 135–145)

## 2023-01-27 LAB — LACTATE DEHYDROGENASE: LACTATE DEHYDROGENASE: 222 U/L (ref 120–246)

## 2023-01-27 LAB — HIGH SENSITIVITY TROPONIN I - SERIAL: HIGH SENSITIVITY TROPONIN I: 11 ng/L (ref <=34)

## 2023-01-27 LAB — PROTIME-INR
INR: 0.91
PROTIME: 10.4 s (ref 9.9–12.6)

## 2023-01-27 LAB — PRO-BNP: PRO-BNP: 352 pg/mL — ABNORMAL HIGH (ref <=300.0)

## 2023-01-27 MED ORDER — DOXYCYCLINE HYCLATE 100 MG CAPSULE
ORAL_CAPSULE | Freq: Two times a day (BID) | ORAL | 0 refills | 10.00 days | Status: CP
Start: 2023-01-27 — End: 2023-01-27

## 2023-01-27 MED ORDER — AZITHROMYCIN 250 MG TABLET
ORAL_TABLET | ORAL | 0 refills | 5.00 days | Status: CP
Start: 2023-01-27 — End: 2023-02-01

## 2023-01-27 NOTE — Unmapped (Signed)
[facility] Emergency Department Provider Note         ED Clinical Impression     Final diagnoses:   SOB (shortness of breath) (Primary)       Presenting History     HPI    January 27, 2023 6:21 PM     Cindy Gonzalez is a 83 y.o. female with history of CAD, HFpEF (on home O2 PRN), HTN, HLD, T2DM, presenting with 1 week of waxing/waning shortness of breath, palpitations, subjective fevers, cough, rhinorrhea, and post-nasal drip. She reports a history of similar every year alongside a viral URI. She has been using her home O2 and tylenol with some relief. Also, today, she took metoprolol for her palpitations and albuterol for SOB. She was prescribed metoprolol and albuterol 2/2 CHF. In the ED, she reports improved SOB. No sick contacts at home. The patient lives by herself at home. Denies dysuria, chest pain, chills, paroxysmal nocturnal dyspnea, leg swelling.     Physical Exam and MDM     BP 194/82  - Pulse 132  - Temp 36.4 ??C (97.5 ??F) (Skin)  - Resp 20  - Wt 79.4 kg (175 lb)  - LMP  (LMP Unknown)  - SpO2 95%  - BMI 32.01 kg/m??     Tachycardic, hypertensive, satting well on room air.    General: Awake, alert and oriented x4, in no distress.  Skin: Skin is warm and dry.  Lungs: Normal respiratory effort. Breath sounds clear bilaterally.  Able to lay down flat for 5 minutes while talking without becoming dyspneic. Decreased lung sounds to R lung field.  Heart: Regular rhythm, normal heart sounds.  Abdomen: Soft and non-tender to palpation.  Musculoskeletal: No deformities or tenderness.  Extremities: No peripheral edema.  Neurological: No gross focal neurologic deficits are appreciated.  Psychiatric: Normal affect and behavior for situation    MDM: Cindy Gonzalez is 83 y.o. female history of hypertension hyperlipidemia HFpEF (on home O2 as needed) CAD presenting with shortness of breath for 1 week with chills, cough and rhinorrhea. Differential diagnosis includes viral infection such as COVID or influenza, other viral infection, pneumonia, pulmonary embolism, CHF exacerbation, atypical ACS, pneumothorax among other etiologies.    Diagnostic workup as below.     Orders Placed This Encounter   Procedures    Influenza/ RSV/COVID PCR    XR Chest 2 views    CBC w/ Differential    Comprehensive Metabolic Panel    LDH, Lactate dehydrogenase    PT-INR    hsTroponin I (serial 0-2-6H w/ delta)    hsTroponin I - 2 Hour    hsTroponin I - 6 Hour    ECG 12 Lead    ECG 12 Lead    ECG 12 Lead    ECG 12 Lead    Insert peripheral IV       Patient Treated with:  Doxycycline IV 100 mg    Will reassess as we get results and update below    ED Course as of 01/28/23 0959   Sun Jan 27, 2023   1854 Patient able to lay flat without any dyspnea.   1854 Heart Rate: 132  Likely due to the albuterol patient used at home.   1908 WBC(!): 2.9  At baseline   1909 XR Chest 2 views  IMPRESSION:  No evidence of acute focal consolidation.     2245 Given patient has rising troponins, believe she requires admission to the hospital per our hospital protocol  for ACS.  Will reach out for admission at this time.  Patient also with pneumonia/developing infection in the right upper lobe on CTA, no evidence of pulmonary embolism, EKG is at baseline with left bundle branch block with not meeting Sgarbossa criteria to indicate any sort of STEMI, believe rising troponins are most likely due to demand.  Have low concern for CHF exacerbation given patient's inability to lay down flat for extended period of time without respiratory distress, also with reassuring BNP.  I have initiated treatment with doxycycline as patient is allergic to amoxicillin and cephalexin, so as to give IV antibiotics while here.  Does not meet sepsis criteria as her white blood cell count is at baseline.  Consider giving fluids however reassuring blood pressure and has history of heart failure.   2315 Hi, I'd like to admit Cindy Gonzalez MRN [id number] in the ED for pneumonia, rising troponins in the setting of shortness of breath per ACS protocol. Thanks    Paged MAO         _____________________________________________________________________    Discussion of Management with other Physicians, QHP, or Appropriate Source:  See ED course above for documentation of pages sent, and discussions with collaborating professionals.  Independent Interpretation of Studies: See ED Course above for my independent reviews.   External Records Reviewed: 10/02/22 [facility] Office Visit   If Patient Not Admitted--Escalation of Care, Consideration of Admission/Observation/Transfer:   Social determinants that significantly affected care: In addition to as noted previously above: None  Prescription drug(s) considered but not prescribed: In addition to as noted previously above: None  Diagnostic tests considered but not performed: In addition to as noted previously above: None  _____________________________________________________________________    The case was discussed with attending physician who is in agreement with the above assessment and plan    Additional Medical Decision Making     I have reviewed the vital signs and the nursing notes. Labs and radiology results that were available during my care of the patient were independently reviewed by me and considered in my medical decision making.   I independently visualized the EKG tracing if performed  I independently visualized the radiology images if performed  I reviewed the patient's prior medical records if available.  Additional history obtained from family if available.    If patient prefers a language other than English, I have used an interpreter or interpreting service for our interactions unless directed otherwise by the patient.    Other History     CHIEF COMPLAINT:   Chief Complaint   Patient presents with    Shortness of Breath       PAST MEDICAL HISTORY/PAST SURGICAL HISTORY:   Past Medical History:   Diagnosis Date    At risk for falls left ankle arthritis; cane     Coronary artery disease     Diabetes mellitus (CMS-HCC)     Diffuse large B cell lymphoma (CMS-HCC)     Hypertension     Impaired mobility     left ankle arthritis, cane    Visual impairment     glasses       Past Surgical History:   Procedure Laterality Date    CHOLECYSTECTOMY      HYSTERECTOMY      OOPHORECTOMY         MEDICATIONS:   No current facility-administered medications for this encounter.    Current Outpatient Medications:     albuterol HFA 90 mcg/actuation inhaler, Inhale 2 puffs every six (  6) hours as needed for wheezing., Disp: 18 g, Rfl: 2    amlodipine (NORVASC) 10 MG tablet, Take 1 tablet (10 mg total) by mouth daily., Disp: 90 tablet, Rfl: 3    aspirin (ECOTRIN) 81 MG tablet, Take 1 tablet (81 mg total) by mouth daily., Disp: 30 tablet, Rfl: 11    atorvastatin (LIPITOR) 10 MG tablet, Take 1 tablet (10 mg total) by mouth daily., Disp: 90 tablet, Rfl: 2    blood sugar diagnostic (ACCU-CHEK GUIDE TEST STRIPS) Strp, by Other route Two (2) times a day (30 minutes before a meal)., Disp: 200 each, Rfl: 1    blood-glucose meter kit, Use as instructed, Disp: 1 each, Rfl: 0    calcium-vitamin D 500 mg(1,250mg ) -200 unit per tablet, Take 1 tablet by mouth in the morning and 1 tablet in the evening. Take with meals., Disp: , Rfl:     cyanocobalamin 2000 MCG tablet, Take 1 tablet (2,000 mcg total) by mouth daily., Disp: , Rfl:     estradiol (ESTRACE) 0.01 % (0.1 mg/gram) vaginal cream, Insert 2 g into the vagina daily. Use externally prn, Disp: 42.5 g, Rfl: 11    fenofibrate (TRICOR) 145 MG tablet, Take 1 tablet (145 mg total) by mouth daily., Disp: 90 tablet, Rfl: 3    fluticasone propionate (FLONASE) 50 mcg/actuation nasal spray, Use 1 spray(s) in each nostril once daily, Disp: 16 g, Rfl: 0    furosemide (LASIX) 20 MG tablet, Take 1 tablet (20 mg total) by mouth 3 (three) times a week. (Patient taking differently: Take 1 tablet (20 mg total) by mouth 3 (three) times a week. Takes twice a week), Disp: 36 tablet, Rfl: 1    hydroxyurea (HYDREA) 500 mg capsule, Take 1 capsule (500mg ) by mouth TWICE daily Monday-Thursday, AND 1 capsule (500mg ) ONCE daily on Friday-Sunday., Disp: 150 capsule, Rfl: 3    ketoconazole (NIZORAL) 2 % cream, APPLY  CREAM TOPICALLY ONCE DAILY FOR SCALP RASH, Disp: 30 g, Rfl: 0    lancets (ACCU-CHEK SOFTCLIX LANCETS) Misc, USE AS DIRECTED TO  CHECK  GLUCOSE  TWICE  DAILY, Disp: 200 each, Rfl: 2    lancing device Misc, USE TO CHECK BLOOD SUGAR 2 TIMES A DAY, Disp: 100 each, Rfl: 2    lisinopril (PRINIVIL,ZESTRIL) 30 MG tablet, Take 1 tablet (30 mg total) by mouth daily., Disp: 90 tablet, Rfl: 3    LORazepam (ATIVAN) 0.5 MG tablet, TAKE 1/2 (ONE-HALF) TABLET BY MOUTH TWICE DAILY AS NEEDED FOR ANXIETY, Disp: 30 tablet, Rfl: 0    meloxicam (MOBIC) 7.5 MG tablet, Take 1 tablet (7.5 mg total) by mouth daily., Disp: 90 tablet, Rfl: 1    metFORMIN (GLUCOPHAGE) 500 MG tablet, TAKE 1 TABLET BY MOUTH IN THE MORNING AND 1/2 (ONE-HALF) ONCE DAILY WITH SUPPER, Disp: 135 tablet, Rfl: 1    metoPROLOL succinate (TOPROL-XL) 50 MG 24 hr tablet, TAKE 1 TABLET BY MOUTH AT BEDTIME (Patient taking differently: Take 1 tablet (50 mg total) by mouth nightly. Sometimes takes one-half at night), Disp: 90 tablet, Rfl: 3    mupirocin (BACTROBAN) 2 % cream, Apply 1 Application topically Two (2) times a day (at 8am and 12:00)., Disp: , Rfl:     mupirocin (BACTROBAN) 2 % ointment, Apply 1 Application topically Two (2) times a day (at 8am and 12:00)., Disp: , Rfl:     nystatin (MYCOSTATIN) 100,000 unit/gram cream, Apply topically two (2) times a day., Disp: 30 g, Rfl: 2    RESTASIS 0.05 % ophthalmic  emulsion, INSTILL 1 DROP INTO EACH EYE TWICE DAILY, Disp: , Rfl:     spironolactone (ALDACTONE) 25 MG tablet, Take 1 tablet (25 mg total) by mouth daily., Disp: 90 tablet, Rfl: 1    triamcinolone (KENALOG) 0.1 % cream, Apply 1 Application topically Two (2) times a day (at 8am and 12:00)., Disp: , Rfl: ALLERGIES:   Amoxicillin, Cephalexin, Ciprofloxacin, Hydrocodone-acetaminophen, and Sulfamethoxazole    SOCIAL HISTORY:   Social History     Tobacco Use    Smoking status: Never    Smokeless tobacco: Never   Substance Use Topics    Alcohol use: Not Currently       FAMILY HISTORY:  Family History   Problem Relation Age of Onset    Cancer Other     Diabetes Mother     Asthma Mother     Hypertension Father     Stroke Father           Review of Systems    A 10 point review of systems was performed and is negative other than positive elements noted in HPI     Radiology     XR Chest 2 views    (Results Pending)       Labs     Labs Reviewed   COMPREHENSIVE METABOLIC PANEL - Abnormal; Notable for the following components:       Result Value    Anion Gap 15 (*)     BUN 26 (*)     Creatinine 1.18 (*)     eGFR CKD-EPI (2021) Female 46 (*)     AST 41 (*)     Alkaline Phosphatase 32 (*)     All other components within normal limits   CBC W/ AUTO DIFF - Abnormal; Notable for the following components:    WBC 2.9 (*)     RBC 3.52 (*)     MCV 112.3 (*)     MCH 37.0 (*)     Platelet 148 (*)     Absolute Neutrophils 1.7 (*)     Absolute Lymphocytes 0.9 (*)     Macrocytosis Slight (*)     All other components within normal limits   INFLUENZA/RSV/COVID PCR - Normal    Narrative:     This test was performed using the Cepheid Xpert Xpress SARS-CoV-2/Flu/RSV plus assay, which has been validated by the CLIA-certified, CAP-inspected [facility]. FDA has granted Emergency Use Authorization for this test. Negative results do not preclude infection and should be interpreted along with clinical observations, patient history, and epidemiological information. Information for providers and patients can be found here: [email or web address]   LACTATE DEHYDROGENASE - Normal   HIGH SENSITIVITY TROPONIN I - SERIAL - Normal   CBC W/ DIFFERENTIAL Narrative:     The following orders were created for panel order CBC w/ Differential.                  Procedure                               Abnormality         Status                                     ---------                               -----------         ------  CBC w/ [id number]]         Abnormal            Final result                                                 Please view results for these tests on the individual orders.   PROTIME-INR   HIGH SENSITIVITY TROPONIN I - 2 HOUR SERIAL       Please note- This chart has been created using [employer]. Chart creation errors have been sought, but may not always be located and such creation errors, especially pronoun confusion, do NOT reflect on the standard of medical care.    Documentation assistance was provided by Schuyler Amor, Scribe on January 27, 2023 at 6:21 PM for Theador Hawthorne, MD.    Documentation assistance was provided by the scribe in my presence.  The documentation recorded by the scribe has been reviewed by me and accurately reflects the services I personally performed.       Earlene Plater, MD  Resident  01/28/23 [phone]

## 2023-01-28 LAB — HIGH SENSITIVITY TROPONIN I - 6 HOUR SERIAL
HIGH SENSITIVITY TROPONIN - DELTA (2-6H): 44 ng/L (ref <=7)
HIGH-SENSITIVITY TROPONIN I - 6 HOUR: 67 ng/L (ref <=34)

## 2023-01-28 LAB — HEPATIC FUNCTION PANEL
ALBUMIN: 3.9 g/dL (ref 3.4–5.0)
ALKALINE PHOSPHATASE: 33 U/L — ABNORMAL LOW (ref 46–116)
ALT (SGPT): 26 U/L (ref 10–49)
AST (SGOT): 29 U/L (ref <=34)
BILIRUBIN DIRECT: 0.1 mg/dL (ref 0.00–0.30)
BILIRUBIN TOTAL: 0.4 mg/dL (ref 0.3–1.2)
PROTEIN TOTAL: 7 g/dL (ref 5.7–8.2)

## 2023-01-28 LAB — PHOSPHORUS: PHOSPHORUS: 4.5 mg/dL (ref 2.4–5.1)

## 2023-01-28 LAB — BASIC METABOLIC PANEL
ANION GAP: 17 mmol/L — ABNORMAL HIGH (ref 5–14)
BLOOD UREA NITROGEN: 23 mg/dL (ref 9–23)
BUN / CREAT RATIO: 22
CALCIUM: 9.9 mg/dL (ref 8.7–10.4)
CHLORIDE: 104 mmol/L (ref 98–107)
CO2: 19.8 mmol/L — ABNORMAL LOW (ref 20.0–31.0)
CREATININE: 1.03 mg/dL — ABNORMAL HIGH (ref 0.55–1.02)
EGFR CKD-EPI (2021) FEMALE: 54 mL/min/{1.73_m2} — ABNORMAL LOW (ref >=60)
GLUCOSE RANDOM: 203 mg/dL — ABNORMAL HIGH (ref 70–179)
POTASSIUM: 4.3 mmol/L (ref 3.4–4.8)
SODIUM: 141 mmol/L (ref 135–145)

## 2023-01-28 LAB — BLOOD GAS, VENOUS
BASE EXCESS VENOUS: -3.3 — ABNORMAL LOW (ref -2.0–2.0)
HCO3 VENOUS: 22 mmol/L (ref 22–27)
O2 SATURATION VENOUS: 76.4 % (ref 40.0–85.0)
PCO2 VENOUS: 35 mmHg — ABNORMAL LOW (ref 40–60)
PH VENOUS: 7.4 (ref 7.32–7.43)
PO2 VENOUS: 43 mmHg — ABNORMAL HIGH (ref 35–40)

## 2023-01-28 LAB — CBC
HEMATOCRIT: 38.9 % (ref 34.0–44.0)
HEMOGLOBIN: 12.8 g/dL (ref 11.3–14.9)
MEAN CORPUSCULAR HEMOGLOBIN CONC: 33 g/dL (ref 32.0–36.0)
MEAN CORPUSCULAR HEMOGLOBIN: 36.9 pg — ABNORMAL HIGH (ref 25.9–32.4)
MEAN CORPUSCULAR VOLUME: 111.8 fL — ABNORMAL HIGH (ref 77.6–95.7)
MEAN PLATELET VOLUME: 8.1 fL (ref 6.8–10.7)
PLATELET COUNT: 161 10*9/L (ref 150–450)
RED BLOOD CELL COUNT: 3.48 10*12/L — ABNORMAL LOW (ref 3.95–5.13)
RED CELL DISTRIBUTION WIDTH: 13.8 % (ref 12.2–15.2)
WBC ADJUSTED: 2.8 10*9/L — ABNORMAL LOW (ref 3.6–11.2)

## 2023-01-28 LAB — LACTATE, VENOUS, WHOLE BLOOD: LACTATE BLOOD VENOUS: 1.8 mmol/L (ref 0.5–1.8)

## 2023-01-28 LAB — HIGH SENSITIVITY TROPONIN I - SINGLE: HIGH SENSITIVITY TROPONIN I: 45 ng/L (ref <=34)

## 2023-01-28 LAB — MAGNESIUM: MAGNESIUM: 1.5 mg/dL — ABNORMAL LOW (ref 1.6–2.6)

## 2023-01-28 LAB — PRO-BNP: PRO-BNP: 3264 pg/mL — ABNORMAL HIGH (ref <=300.0)

## 2023-01-28 MED ADMIN — doxycycline (VIBRAMYCIN) 100 mg in sodium chloride 0.9 % (NS) 100 mL IVPB-MBP: 100 mg | INTRAVENOUS | @ 16:00:00 | Stop: 2023-01-28

## 2023-01-28 MED ADMIN — aspirin chewable tablet 81 mg: 81 mg | ORAL | @ 14:00:00 | Stop: 2023-01-28

## 2023-01-28 MED ADMIN — amlodipine (NORVASC) tablet 10 mg: 10 mg | ORAL | @ 14:00:00 | Stop: 2023-01-28

## 2023-01-28 MED ADMIN — lisinopril (PRINIVIL,ZESTRIL) tablet 30 mg: 30 mg | ORAL | @ 14:00:00 | Stop: 2023-01-28

## 2023-01-28 MED ADMIN — albuterol (PROVENTIL HFA;VENTOLIN HFA) 90 mcg/actuation inhaler 2 puff: 2 | RESPIRATORY_TRACT | @ 16:00:00 | Stop: 2023-01-28

## 2023-01-28 MED ADMIN — spironolactone (ALDACTONE) tablet 25 mg: 25 mg | ORAL | @ 14:00:00 | Stop: 2023-01-28

## 2023-01-28 MED ADMIN — LORazepam (ATIVAN) tablet 0.25 mg: .25 mg | ORAL | @ 05:00:00 | Stop: 2023-01-28

## 2023-01-28 MED ADMIN — iohexol (OMNIPAQUE) 350 mg iodine/mL solution 75 mL: 75 mL | INTRAVENOUS | @ 02:00:00 | Stop: 2023-01-27

## 2023-01-28 MED ADMIN — meloxicam (MOBIC) tablet 7.5 mg: 7.5 mg | ORAL | @ 14:00:00 | Stop: 2023-01-28

## 2023-01-28 MED ADMIN — acetaminophen (TYLENOL) tablet 650 mg: 650 mg | ORAL | @ 13:00:00 | Stop: 2023-01-28

## 2023-01-28 MED ADMIN — hydroxyurea (HYDREA) capsule 500 mg: 500 mg | ORAL | @ 15:00:00 | Stop: 2023-01-28

## 2023-01-28 MED ADMIN — atorvastatin (LIPITOR) tablet 10 mg: 10 mg | ORAL | @ 14:00:00 | Stop: 2023-01-28

## 2023-01-28 MED ADMIN — metFORMIN (GLUCOPHAGE) tablet 500 mg: 500 mg | ORAL | @ 14:00:00 | Stop: 2023-01-28

## 2023-01-28 MED ADMIN — potassium chloride ER tablet 40 mEq: 40 meq | ORAL | @ 21:00:00 | Stop: 2023-01-28

## 2023-01-28 MED ADMIN — doxycycline (VIBRAMYCIN) 100 mg in sodium chloride 0.9 % (NS) 100 mL IVPB-MBP: 100 mg | INTRAVENOUS | @ 03:00:00 | Stop: 2023-01-27

## 2023-01-28 NOTE — Unmapped (Signed)
ED Progress Note    Received sign out from previous provider.    Patient Summary: Cindy Gonzalez is a 83 y.o. female with history of CAD, HFpEF, HTN, HLD, T2DM who presented with one week of waxing/waning shortness of breath, palpitations, subjective fevers, cough, rhinorrhea.   CTA chest demonstrating area of groundglass opacity within the right upper lobe which may be infectious in etiology. Troponin 11 -> 23 -> 67.  BNP 352  Received doxycycline, ativan  Action List:   []  admit- pending    Updates  ED Course as of 01/28/23 1456   Mon Jan 28, 2023   0751 Repeat troponin downtrending to 45. Home meds ordered     1017 Patient reevaluated, resting comfortably in bed. She reports mild shortness of breath. Lungs clear to auscultation. Will order patient's home albuterol   1456 Care handed over to oncoming providers with sign out performed at change of shift

## 2023-01-28 NOTE — Unmapped (Signed)
.  ISAR Screening- for all patients 75+ community-dwelling patients    Before the illness or injury that brought you to the Emergency Department, did you need someone to help you regulary?no   Since the illness or injury that brought you to the Emergency Department, have you needed more help than usual to take care of yourself?no  Have you been hospitalized for one or more nights during the past six months (excluding a stay in the Emergency Department)?no  In general, do you see well?yes  In general, do you have serious problems with your memory? no  Do you take more than five different prescribed medications every day? yes    A score of 3 or more will require a Case Management consultation. Paged [phone]. Primary RN notified of score of 3 or more and CM paged for assessment.

## 2023-01-28 NOTE — Unmapped (Signed)
ED Progress Note    Potassium erroneously ordered. Fortunately, unlikely to cause significant complications at this dose and with current labs. Potassium can be trended as inpatient.

## 2023-01-28 NOTE — Unmapped (Addendum)
[facility] Medicine   History and Physical       Assessment and Plan     [name] is a 83 y.o. female who is presenting to [facility] with Shortness of breath, in the setting of the following pertinent/contributing co-morbidities: HFmrEF, tachycardia.    Shortness of breath   Likely multifactorial - URI on asthma and sinus tachycardia. Cannot rule out volume overload or worsening HF, though she is usually fairly complaint with her home medications and EF had recovered on last check in 2023.   - Work up/treatment as outlined below    URI (upper respiratory infection)  Mild intermittent asthma without complication  RUL opacities, immunocompromised  On home oxygen therapy 1-2L [place] as needed and uses albuterol PRN as well. Reports she usually doesn't have much trouble with her breathing. Her CXR looks clear but CTA done to rule out PE showed very small opacities in RUL. She is mildly immunocompromised due to hydroxyurea (leukopenia) but clinically does not have severe pneumonia (no fevers, no hypoxia, no focal pulm findings on exam, and small area of focal opacities only on CT). I think it is reasonable to cover for atypical infection and monitor closely.  PLAN  - Switch to azithromycin  - Stop IV antibiotics  - Send MRSA swab, expanded RVP  - Supportive care    Hypertension  CAD nonobstructive disease, HLD  Heart failure with mid-range ejection fraction   EF 35-40%% in 04/2021, 55-60% in 06/2021  She has some 1+ edema to mid shins on exam. She reports this is actually pretty good for her and wouldn't take her prn lasix yet (taking it 2-3 times a week prn). She is only 2-3lbs above her dry weight of ~172lbs but she was 170lb a year ago. I think she may benefit from an IV lasix challenge. Given age, do not want to start diuresis at 9:30pm. I will adjust home HTN meds given I do not think she takes all of her medications as prescribed and do not want her to bottom out, especially if BNP is elevated and we proceed with lasix challenge in the morning.  PLAN  - Check BNP  - Consider lasix 40 IV in the morning   - Strict I/Os, daily weight  - Standing mag repletion x3 days  - Reasonable to repeat TTE to confirm EF is still normal range  - Continue spironolactone, metop with hold parameters  - Continue dose reduced lisinopril (30mg  at home -> 10mg  daily) during diuretic challenge  - HOLD amlodipine 10 daily during diuretic challenge    Sinus tachycardia  Palpitations   Reports palpitations 1-2x a week at home. Rates as high as 120-130s here. Likely triggered by underlying process (URI, volume overload) and worsened by ED holding her home ativan (missed 3 doses) and metoprolol (though unclear how adherent she is to this at home). PE ruled out with CTA. Appears slightly volume up, not dehydrated.   PLAN  - Resume home metop succ 50  - Resume home ativan prn  - Continue on telemetry  - Follows with Dr. Julio Alm at [facility]; consider cards consult if tachycardia difficult to control/persists despite resuming home meds  - Okay to admit to floor given clinical stability/asymptomatic currently    Secondary/Additional Active Problems:    Polycythemia vera   Leukopenia  JAK2 mutation, follows with heme/onc. Has been on hydroxyurea since early 2024 and counts have fallen appropriately since that time.   - Hydrea 500 mg (1 tablet BID  on Mon - Thur, 1 tablet every day on Fri - Sun)  - Continue low dose aspirin    Diabetes mellitus   Obesity  A1c 6-7s. Given age favor a more conservative approach.   - Monitor glucose on BMP, if frequently >200 can order POC and SSI  - Hold metformin while inpatient  - Order consistent carb diet    Anxiety  Caution with holding this home medication, at risk for withdrawal given chronic use. Does not drink alcohol.   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 Cr currently at baseline.  - On lisinopril; continue with hold parameters    Prophylaxis  -Home aspirin, therapeutic walk BID  -Order pharmacologic ppx if remains inpatient >48 hours    Diet  -Nutrition Therapy Heart Healthy; Sodium Restricted (No Added Salt)    Code Status / HCDM   -DNR and DNI, Unverified but presumed, based on previous history   -  HCDM (HCPOA): Shakelia, Scrivner - Son - [phone]    HCDM, First Alternate: Blythe Stanford - Daughter - [phone]    Anticipated Medically Ready for Discharge: Anticipated Tomorrow    Significant Comorbid Conditions:  -Thrombocytopenia POA requiring further investigation or monitor  -Chronic kidney disease POA requiring further investigation, treatment, or monitoring    Issues Impacting Complexity of Management:  -The patient is at high risk from Hospital immobility in an elderly patient given baseline poor functional status with a high risk of causing delirium and further decline in function    Medical Decision Making: Reviewed records from the following unique sources  CareEverywhere, outpatient cardiology notes. Discussed the patient's management and/or test interpretation with ED Provider as summarized within this note.    I personally spent greater than 90 minutes face-to-face and non-face-to-face in the care of this patient, which includes all pre, intra, and post visit time on the date of service.  All documented time was specific to the E/M visit and does not include any procedures that may have been performed.    Chauncy Lean, MD  Hospital Medicine     HPI      Cindy Gonzalez is a 83 y.o. female who is presenting to [facility] with Shortness of breath, cough, and palpitations x 3-4 days.    Reports she was in her usual state of health until about 4-5 days ago now. States she developed a runny nose and cough a few days before that but then things got worse. Developed more persistent cough, SOB, and was having a racing heart more frequently. Usually gets palpitations a few times a week but these were more prolonged and she felt more SOB when they occurred. She does not feel like her legs are very swollen right now. Usually takes her lasix prn for worsening swelling. Reports dry weight is around 172-173lbs. 175 at admission. No fever/chills. No N/V, diarrhea. Denies constipation. No urinary symptoms. She is otherwise doing fine.       Med Rec Confidence   I reviewed the Medication List. There is Uncertainty about the medications the patient is currently taking. She reports intermittently using oxygen, taking meds sometimes that are daily meds.    Physical Exam   Temp:  [36.4 ??C (97.6 ??F)-36.9 ??C (98.4 ??F)] 36.4 ??C (97.6 ??F)  Heart Rate:  [85-133] 112  SpO2 Pulse:  [86-131] 90  Resp:  [14-24] 23  BP: (97-132)/(55-77) 116/57  SpO2:  [92 %-98 %] 92 %  Body mass index is 32.01 kg/m??.  GEN: Appears stated age, NAD, resting in ED bed  HEENT: White sclera, MMM, grossly normal hearing  PULM: No respiratory distress, on room air, scattered wheeze but good air movement  CARDS: Tachycardic, regular rhythm, warm/well perfused, 1+ LEE to shins  ABD: Large, soft, NT, +bs  SKIN: Warm, dry, no rashes or wounds visible  NEURO: Nonfocal, did not observe gain, mild tremor at rest  PSYCH: Calm, conversational

## 2023-01-28 NOTE — Unmapped (Signed)
Pt reports on and off shortness of breath, heart racing, coughing and elevated BP for 3-4 days. Hx of CHF and CAD, and she is on 02 PRN

## 2023-01-28 NOTE — Unmapped (Signed)
Received sign out from previous provider.    Patient Summary: Cindy Gonzalez is a 83 y.o. female with past medical history of CAD, HFpEF, HTN, HLD, T2DM who presented with one week of waxing/waning shortness of breath, palpitations, subjective fevers, cough, rhinorrhea.  Workup initiated by previous providers, significant for a pneumonia, receiving doxycycline due to allergies.  Also has evidence of demand ischemia with troponin initially 23, with repeats 45 and 67.  Leukopenia at baseline.  Mild AKI.  COVID/flu/RSV negative.  BNP mildly elevated.  CTA chest without evidence of PE.  Patient was tachycardic on arrival, received lisinopril, improved on repeat. Metoprolol also ordered, not yet given.  At time of signout, patient pending admission.  Action List:   Admit    Updates    ED Course as of 01/28/23 2124   Mon Jan 28, 2023   1851 Spoke with admitting team

## 2023-01-29 LAB — CBC
HEMATOCRIT: 39.4 % (ref 34.0–44.0)
HEMOGLOBIN: 13 g/dL (ref 11.3–14.9)
MEAN CORPUSCULAR HEMOGLOBIN CONC: 32.9 g/dL (ref 32.0–36.0)
MEAN CORPUSCULAR HEMOGLOBIN: 37.1 pg — ABNORMAL HIGH (ref 25.9–32.4)
MEAN CORPUSCULAR VOLUME: 112.5 fL — ABNORMAL HIGH (ref 77.6–95.7)
MEAN PLATELET VOLUME: 7.7 fL (ref 6.8–10.7)
PLATELET COUNT: 173 10*9/L (ref 150–450)
RED BLOOD CELL COUNT: 3.5 10*12/L — ABNORMAL LOW (ref 3.95–5.13)
RED CELL DISTRIBUTION WIDTH: 13.9 % (ref 12.2–15.2)
WBC ADJUSTED: 3 10*9/L — ABNORMAL LOW (ref 3.6–11.2)

## 2023-01-29 LAB — COMPREHENSIVE METABOLIC PANEL
ALBUMIN: 3.8 g/dL (ref 3.4–5.0)
ALKALINE PHOSPHATASE: 29 U/L — ABNORMAL LOW (ref 46–116)
ALT (SGPT): 21 U/L (ref 10–49)
ANION GAP: 14 mmol/L (ref 5–14)
AST (SGOT): 22 U/L (ref <=34)
BILIRUBIN TOTAL: 0.6 mg/dL (ref 0.3–1.2)
BLOOD UREA NITROGEN: 27 mg/dL — ABNORMAL HIGH (ref 9–23)
BUN / CREAT RATIO: 22
CALCIUM: 9.6 mg/dL (ref 8.7–10.4)
CHLORIDE: 105 mmol/L (ref 98–107)
CO2: 21.8 mmol/L (ref 20.0–31.0)
CREATININE: 1.21 mg/dL — ABNORMAL HIGH (ref 0.55–1.02)
EGFR CKD-EPI (2021) FEMALE: 44 mL/min/{1.73_m2} — ABNORMAL LOW (ref >=60)
GLUCOSE RANDOM: 174 mg/dL (ref 70–179)
POTASSIUM: 4.9 mmol/L — ABNORMAL HIGH (ref 3.4–4.8)
PROTEIN TOTAL: 6.8 g/dL (ref 5.7–8.2)
SODIUM: 141 mmol/L (ref 135–145)

## 2023-01-29 LAB — MAGNESIUM: MAGNESIUM: 1.6 mg/dL (ref 1.6–2.6)

## 2023-01-29 MED ADMIN — magnesium oxide (MAG-OX) tablet 400 mg: 400 mg | ORAL | @ 01:00:00 | Stop: 2023-01-31

## 2023-01-29 MED ADMIN — fenofibrate (TRICOR) tablet 145 mg: 145 mg | ORAL | @ 01:00:00

## 2023-01-29 MED ADMIN — fenofibrate (TRICOR) tablet 145 mg: 145 mg | ORAL | @ 14:00:00 | Stop: 2023-01-29

## 2023-01-29 MED ADMIN — aspirin chewable tablet 81 mg: 81 mg | ORAL | @ 14:00:00

## 2023-01-29 MED ADMIN — lisinopril (PRINIVIL,ZESTRIL) tablet 10 mg: 10 mg | ORAL | @ 14:00:00

## 2023-01-29 MED ADMIN — azithromycin (ZITHROMAX) tablet 250 mg: 250 mg | ORAL | @ 14:00:00 | Stop: 2023-02-02

## 2023-01-29 MED ADMIN — hydroxyurea (HYDREA) capsule 500 mg: 500 mg | ORAL | @ 02:00:00

## 2023-01-29 MED ADMIN — atorvastatin (LIPITOR) tablet 10 mg: 10 mg | ORAL | @ 14:00:00

## 2023-01-29 MED ADMIN — perflutren lipid microspheres (DEFINITY) injection 3 mL: 3 mL | INTRAVENOUS | @ 20:00:00 | Stop: 2023-01-29

## 2023-01-29 MED ADMIN — meloxicam (MOBIC) tablet 7.5 mg: 7.5 mg | ORAL | @ 14:00:00

## 2023-01-29 MED ADMIN — LORazepam (ATIVAN) tablet 0.5 mg: .5 mg | ORAL | @ 01:00:00

## 2023-01-29 MED ADMIN — azithromycin (ZITHROMAX) tablet 500 mg: 500 mg | ORAL | @ 01:00:00 | Stop: 2023-01-28

## 2023-01-29 MED ADMIN — fluticasone propionate (FLONASE) 50 mcg/actuation nasal spray 1 spray: 1 | NASAL | @ 14:00:00

## 2023-01-29 MED ADMIN — hydroxyurea (HYDREA) capsule 500 mg: 500 mg | ORAL | @ 14:00:00 | Stop: 2023-01-29

## 2023-01-29 MED ADMIN — magnesium oxide (MAG-OX) tablet 400 mg: 400 mg | ORAL | @ 14:00:00 | Stop: 2023-01-31

## 2023-01-29 MED ADMIN — spironolactone (ALDACTONE) tablet 25 mg: 25 mg | ORAL | @ 14:00:00 | Stop: 2023-01-29

## 2023-01-29 MED ADMIN — metoPROLOL succinate (Toprol-XL) 24 hr tablet 50 mg: 50 mg | ORAL | @ 01:00:00 | Stop: 2023-01-28

## 2023-01-29 NOTE — Unmapped (Addendum)
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 female with PMH HFrecEF, CAD, polycythemia vera, DM2, anxiety, CK3a who presented to [facility] with dyspnea, cough, palpitations x3-4 days on 01/27/2023, found to have CHF exacerbation with new reduction in EF.    Acute decompensated  HFrecEF  - echo this admission with EF 30-35%, normal RV fxn. Last echo 06/2021 EF 55-60%, normal LV and RV fxn and prior to that echo 04/2021 EF 35-40%. Etiology likely d/t patient not taking her GDMT consistently (is filling most of them but reports she takes metop sometimes at night, furosemide 2x/week. Daughter reports pt feels better when SBP >120 and overall that pt has health anxiety) vs d/t tachyarrhythemia. Not profoundly volume overloaded on exam, pro-BNP 3,200. Follows with Dr. Julio Alm. Dry weight ~170 lbs. Home meds: furosemide 20 PRN (uses 2x/week), lisinopril 30, spiro 25, metop 50.  - IV furosemide for goal 1-2L net negative daily  - low salt diet, 2L fluid restriction  - daily standing weights  - replete electrolytes for M>2, K>4  - telemetry  - GDMT  - BB: continue home metoprolol  - SGLT2: not on one, holding for now given softer BP  - MRA: restart spironolactone at lower dose in AM given given soft BP's  - ACE/ARB/ARNI: cont home lisinopril at lower dose given soft BP's  - cardiology consult in AM for possible ischemic workup given reduction in EF    Upper respiratory tract infxn in mild intermittent asthma  Very small RUL GGO  Dyspnea 2/2 above  Chronic hypoxic respiratory failure (on 1-2L as needed)  -Patient presented with cough, dyspnea, palpitations. CXR without consolidation. CTA negative for PE, did show a very small area of GGO in RUL. RPP and MRSA nares negative. Has been on room air this admission with SpO2 >92%. Per pt was placed on oxygen after an ER visit a few years ago and uses it as needed when she has dyspnea.  - Continue supportive care with acetaminophen, guaifenesin  - Continue azithromycin + add cefdinir for CAP coverage (pt refusing IV abx as reports they cause her palpitations)  - consider repeat CT in 6-8 wks to ensure resolution  - 6-minute walk test prior to discharge    Sinus tachycardia  Palpitations  - She reports palpitations for 1 to 2 weeks at home though has had these for years. HR this admission have been 90-120s. Admission EKG w/ sinus tachycardia. Likely due to underlying URI versus hypervolemia.  - diuresis as above  - tele    Secondary/Additional Active Problems:    HTN: discontinued amlodipine as does not appear to be taking outpt/filling. dose reduced lisinopril as do not suspect patient takes all her GDMT and likely her BP cannot tolerate 30 mg daily. Cont metoprolol, furosemide, spironolactone as above.  CAD non-obstructive, HLD: cont home statin, aspirin  Polycythemia vera, leukopenia: Jak 2 mutation, follows with heme-onc. Has been on  hydroxyurea since 2024 and WBC has been low since that time. Continue home hydroxyurea, asa, onc follow up  DM: A1c 6.7% 10/2022. Home med: metformin. Diabetic diet, holding home metformin  Anxiety: continue home lorazepam bid, delirium precautions, daughter asking about starting SSRI/SNRI to help with anxiety. Will discuss w/ pt tomorrow if she is amenable  CKD3a: b/l Cr 0.9-1, avoid nephrotoxins, renally dose meds  OA: holding hold meloxicam given her CKD  Asthma: cont home albuterol PRN    Non-MI troponin elevation likely due to acute illness, resolved    DVT prophylaxis: Padua score 4, heparin subq  Diet: Nutrition Therapy Consistent Carb; Consistent Carb 75/75/75 (5/5/5); Fluid 2000 ml; Sodium Restricted (No Added Salt)  Code Status / HCDM:  -DNR and DNI  -  HCDM (HCPOA): Kerrilyn, Azbill - Son - [phone]    HCDM, First Alternate: Blythe Stanford - Daughter [phone]  Bowel: senna  PT/OT: PT/OT ordered  Anticipated Medically Ready for Discharge: Anticipated Tomorrow    Please page [facility] attending [phone] with questions. This note reflects active problems and daily updates. Please refer to hospital course for additional details and resolved problems.     Ruffin Pyo, MD  Hospital Medicine  Pager: [phone]  ________________________________________________________________    Subjective:  -overnight no acute events  -continuing to have some cough but feeling better overall  -feels that her edema is close to her baseline  -daughter updated via phone  -plan discussed w/ primary RN    Objective:  Exam:  BP 128/65  - Pulse 88  - Temp 35.4 ??C (95.8 ??F) (Temporal)  - Resp 21  - Wt 78.2 kg (172 lb 8 oz)  - LMP  (LMP Unknown)  - SpO2 92%  - BMI 31.55 kg/m??   Physical Exam  Constitutional:       General: She is not in acute distress.     Appearance: She is not toxic-appearing.   Eyes:      Conjunctiva/sclera: Conjunctivae normal.      Comments: No scleral icterus   Cardiovascular:      Rate and Rhythm: Normal rate and regular rhythm.      Heart sounds: Murmur (systolic murmurs) heard.   Pulmonary:      Effort: Pulmonary effort is normal. No respiratory distress.      Breath sounds: Normal breath sounds. No wheezing.   Abdominal:      General: Abdomen is flat. There is no distension.      Palpations: Abdomen is soft.      Tenderness: There is no abdominal tenderness.   Musculoskeletal:         General: No swelling or tenderness.   Skin:     General: Skin is warm and dry.      Coloration: Skin is not jaundiced.   Neurological:      Mental Status: She is alert.      Comments: Oriented to situation   Psychiatric:         Mood and Affect: Mood normal.         Behavior: Behavior normal.       Data Reviewed:  Labs: wbc 3, k 4.9, cr 1.21  Micro: RVP negative  Imaging: CXR without consolidation. CTA negative for PE, did show a very small area of GGO in RUL. echo this admission with EF 30-35%, normal RV fxn. EKG w/ sinus tachycardia.

## 2023-01-29 NOTE — Unmapped (Signed)
Patient alert and oriented x4. Denies pain and or discomfort at time of this note. Vitals WNL. Tele in place per order with continuous Spo2 .  Problem: Adult Inpatient Plan of Care  Goal: Plan of Care Review  Outcome: Progressing  Goal: Patient-Specific Goal (Individualized)  Outcome: Progressing  Goal: Absence of Hospital-Acquired Illness or Injury  Outcome: Progressing  Goal: Optimal Comfort and Wellbeing  Outcome: Progressing  Goal: Readiness for Transition of Care  Outcome: Progressing  Goal: Rounds/Family Conference  Outcome: Progressing

## 2023-01-29 NOTE — Unmapped (Signed)
Pt in with shortness of breath. Pt on Tele & continuous pulse oximetry. No call outs this shift. Echo completed, pt have cardiology consult. Pt made aware of cardiology consult & able to verbalize understanding of POC. Fall, skin & Delirium precautions. Will continue to work toward prioritized goals.       Problem: Adult Inpatient Plan of Care  Goal: Plan of Care Review  Outcome: Progressing     Problem: Adult Inpatient Plan of Care  Goal: Patient-Specific Goal (Individualized)  Outcome: Progressing     Problem: Adult Inpatient Plan of Care  Goal: Absence of Hospital-Acquired Illness or Injury  Outcome: Progressing  Intervention: Identify and Manage Fall Risk  Recent Flowsheet Documentation  Taken 01/29/2023 0800 by Alden Hipp, RN  Safety Interventions:   fall reduction program maintained   low bed   nonskid shoes/slippers when out of bed  Intervention: Prevent Skin Injury  Recent Flowsheet Documentation  Taken 01/29/2023 0800 by Alden Hipp, RN  Positioning for Skin: Bed in Chair  Skin Protection:   cleansing with dimethicone incontinence wipes   zinc oxide barrier cream   incontinence pads utilized     Problem: Adult Inpatient Plan of Care  Goal: Absence of Hospital-Acquired Illness or Injury  Intervention: Identify and Manage Fall Risk  Recent Flowsheet Documentation  Taken 01/29/2023 0800 by Alden Hipp, RN  Safety Interventions:   fall reduction program maintained   low bed   nonskid shoes/slippers when out of bed

## 2023-01-29 NOTE — Unmapped (Addendum)
Outpt to do:  [ ]  consider repeat CT chest in 6-8 wks to ensure resolution of GGO  [ ]  stop meloxicam given CKD  [ ]  start SSRI/SNRI?    Cindy Gonzalez is a 83 y.o. female with PMH HFrecEF, CAD, polycythemia vera, DM2, anxiety, CK3a who presented to [facility] with dyspnea, cough, palpitations x3-4 days on 01/27/2023, found to have CHF exacerbation with new reduction in EF.     Acute decompensated  HFrecEF  Pt presented with dyspnea and slight mild lower extremity edema. Admission pro-BNP 3,200. echo this admission with EF 30-35%, normal RV fxn. Last echo 06/2021 EF 55-60%, normal LV and RV fxn and prior to that echo 04/2021 EF 35-40%. Etiology likely d/t patient not taking her GDMT consistently (is filling most of them but reports she takes metop sometimes at night, furosemide 2x/week. Daughter reports pt feels better when SBP >120 and overall that pt has health anxiety) vs d/t tachyarrhythemia. Follows with Dr. Julio Alm. Dry weight ~170 lbs. Home meds: furosemide 20 PRN (uses 2x/week), lisinopril 30, spiro 25, metop 50. She was diuresed with furosemide and restarted on her home GDMT. Cardiology was consulted for decreased EF and recommended changing from lisinopril to losartan, otherwise continuing her current regimen. She has outpatient follow-up with cardiology in 4 weeks.      Upper respiratory tract infxn in mild intermittent asthma  Very small RUL GGO  Dyspnea 2/2 above  Chronic hypoxic respiratory failure (on 1-2L as needed)  Patient presented with cough, dyspnea, palpitations. CXR without consolidation. CTA negative for PE,  though did show a very small area of GGO in RUL. RPP and MRSA nares negative. Has been on room air this admission with SpO2 >92%. Per pt was placed on oxygen after an ER visit a few years ago and uses it as needed when she has dyspnea. She was treated with azithromycin + cefdinir for CAP coverage.      Sinus tachycardia  Palpitations  She reports palpitations for 1 to 2 weeks at home though has had these for years. HR this admission have been 90-120s. Admission EKG w/ sinus tachycardia. Likely due to underlying URI versus hypervolemia.

## 2023-01-29 NOTE — Unmapped (Signed)
Care Management  Initial Transition Planning Assessment    Pt lives alone at [employer] housing in [place].  States she still drives but son also transports her. Has a cane and rollator at home in addition to O2 which pt  Just puts on when I fell like it.  Does not remember which O2 supplier she has (some place in [place]).    Type of Residence: Mailing Address:  [address]  [address]  [place] [address]  Contacts:    Patient Phone Number: [phone] (home)           Medical Provider(s): Mangel, [name], DO  Reason for Admission: Admitting Diagnosis:  SOB (shortness of breath) [R06.02]  Pneumonia of right upper lobe due to infectious organism [J18.9]  Past Medical History:   has a past medical history of At risk for falls, Coronary artery disease, Diabetes mellitus (CMS-HCC), Diffuse large B cell lymphoma (CMS-HCC), Hypertension, Impaired mobility, and Visual impairment.  Past Surgical History:   has a past surgical history that includes Cholecystectomy; Hysterectomy; and Oophorectomy.   Previous admit date: 07/20/2019    Primary Insurance- Payor: HUMANA MEDICARE ADV / Plan: HUMANA GOLD PLUS HMO / Product Type: *No Product type* /   Secondary Insurance - Secondary Insurance  MEDICAID [place]  Prescription Coverage -   Preferred Pharmacy - [id number] - [place], [place] - [address] [place] [address]  [facility]    Transportation home: Probation officer assessed the patient by : Telephone conversation with patient  Orientation Level: Oriented X4  Functional level prior to admission: Independent  Reason for referral: Discharge Planning    Contact/Decision Maker  Extended Emergency Contact Information  Primary Emergency Contact: Baker Pierini [place] of [employer] Phone: [phone]  Relation: Daughter  Secondary Emergency Contact: Saxer,Ernest  Address: [address]           [place], [place] [address] [place]  Home Phone: [phone]  Mobile Phone: [phone]  Relation: Son    Legal Next of Kin / Guardian / POA / Advance Directives     HCDM (HCPOA): Aracelly, Tencza - Son - [phone]    HCDM, First Alternate: Blythe Stanford - Daughter - [phone]    Advance Directive (Medical Treatment)  Does patient have an advance directive covering medical treatment?: Patient does not have advance directive covering medical treatment.    Health Care Decision Maker [HCDM] (Medical & Mental Health Treatment)  Healthcare Decision Maker: Patient needs follow-up to appoint a Health Care Decision Maker.    Advance Directive (Mental Health Treatment)  Does patient have an advance directive covering mental health treatment?: Patient does not have advance directive covering mental health treatment.    Readmission Information    Have you been hospitalized in the last 30 days?: No                                Did the following happen with your discharge?                                                     Patient Information  Lives  with: Alone    Type of Residence: Private residence             Support Systems/Concerns: Children    Responsibilities/Dependents at home?: No    Home Care services in place prior to admission?: No          Outpatient/Community Resources in place prior to admission: Clinic       Equipment Currently Used at Home: none       Currently receiving outpatient dialysis?: No       Financial Information       Need for financial assistance?: No       Social Determinants of Health  Social Determinants of Health were addressed in provider documentation.  Please refer to patient history.  Social Drivers of Health     Food Insecurity: No Food Insecurity (01/29/2023)    Hunger Vital Sign     Worried About Running Out of Food in the Last Year: Never true     Ran Out of Food in the Last Year: Never true   Internet Connectivity: No Internet connectivity concern identified (06/27/2021)    Internet Connectivity     Do you have access to internet services: Yes     How do you connect to the internet: Personal Device at home     Is your internet connection strong enough for you to watch video on your device without major problems?: Yes     Do you have enough data to get through the month?: Yes     Does at least one of the devices have a camera that you can use for video chat?: Yes   Housing/Utilities: Low Risk  (01/29/2023)    Housing/Utilities     Within the past 12 months, have you ever stayed: outside, in a car, in a tent, in an overnight shelter, or temporarily in someone else's home (i.e. couch-surfing)?: No     Are you worried about losing your housing?: No     Within the past 12 months, have you been unable to get utilities (heat, electricity) when it was really needed?: No   Tobacco Use: Low Risk  (01/27/2023)    Patient History     Smoking Tobacco Use: Never     Smokeless Tobacco Use: Never     Passive Exposure: Not on file   Transportation Needs: No Transportation Needs (01/29/2023)    PRAPARE - Transportation     Lack of Transportation (Medical): No     Lack of Transportation (Non-Medical): No   Alcohol Use: Not At Risk (08/16/2022)    Alcohol Use     How often do you have a drink containing alcohol?: Never     How many drinks containing alcohol do you have on a typical day when you are drinking?: 1 - 2     How often do you have 5 or more drinks on one occasion?: Never   Interpersonal Safety: Not At Risk (06/27/2021)    Interpersonal Safety     Unsafe Where You Currently Live: No     Physically Hurt by Anyone: No     Abused by Anyone: No   Physical Activity: Not on file   Intimate Partner Violence: Not At Risk (06/27/2021)    Humiliation, Afraid, Rape, and Kick questionnaire     Fear of Current or Ex-Partner: No     Emotionally Abused: No     Physically Abused: No     Sexually Abused: No   Stress: No Stress Concern  Present (10/16/2021)    [employer] of Occupational Health - Occupational Stress Questionnaire     Feeling of Stress : Not at all   Substance Use: Low Risk  (08/16/2022)    Substance Use     In the past year, how often have you used prescription drugs for non-medical reasons?: Never     In the past year, how often have you used illegal drugs?: Never     In the past year, have you used any substance for non-medical reasons?: No   Social Connections: Not on file   Financial Resource Strain: Low Risk  (01/29/2023)    Overall Financial Resource Strain (CARDIA)     Difficulty of Paying Living Expenses: Not hard at all   Depression: Not at risk (06/12/2022)    PHQ-2     PHQ-2 Score: 0   Health Literacy: Low Risk  (10/15/2019)    Health Literacy     : Never       Complex Discharge Information    Is patient identified as a difficult/complex discharge?: No                                                               Interventions:       Discharge Needs Assessment  Concerns to be Addressed: discharge planning    Clinical Risk Factors: > 65, Functional Limitations    Barriers to taking medications: No    Prior overnight hospital stay or ED visit in last 90 days: No         Patient's Choice of Community Agency(s): none    Anticipated Changes Related to Illness: none    Equipment Needed After Discharge: none    Discharge Facility/Level of Care Needs: other (see comments) (home)    Readmission  Risk of Unplanned Readmission Score:  %  Predictive Model Details   No score data available for [facility] Risk of Unplanned Readmission     Readmitted Within the Last 30 Days? (No if blank)   Patient at risk for readmission?: No    Discharge Plan  Screen findings are: Discharge planning needs identified or anticipated (Comment).    Expected Discharge Date: 01/29/2023    Expected Transfer from Critical Care:  (n/a)    Quality data for continuing care services shared with patient and/or representative?: N/A  Patient and/or family were provided with choice of facilities / services that are available and appropriate to meet post hospital care needs?: N/A       Initial Assessment complete?: Yes

## 2023-01-30 LAB — MAGNESIUM: MAGNESIUM: 2 mg/dL (ref 1.6–2.6)

## 2023-01-30 LAB — CBC
HEMATOCRIT: 39 % (ref 34.0–44.0)
HEMOGLOBIN: 12.9 g/dL (ref 11.3–14.9)
MEAN CORPUSCULAR HEMOGLOBIN CONC: 33.1 g/dL (ref 32.0–36.0)
MEAN CORPUSCULAR HEMOGLOBIN: 37.1 pg — ABNORMAL HIGH (ref 25.9–32.4)
MEAN CORPUSCULAR VOLUME: 112.1 fL — ABNORMAL HIGH (ref 77.6–95.7)
MEAN PLATELET VOLUME: 8.5 fL (ref 6.8–10.7)
PLATELET COUNT: 168 10*9/L (ref 150–450)
RED BLOOD CELL COUNT: 3.48 10*12/L — ABNORMAL LOW (ref 3.95–5.13)
RED CELL DISTRIBUTION WIDTH: 13.6 % (ref 12.2–15.2)
WBC ADJUSTED: 3.1 10*9/L — ABNORMAL LOW (ref 3.6–11.2)

## 2023-01-30 LAB — BASIC METABOLIC PANEL
ANION GAP: 15 mmol/L — ABNORMAL HIGH (ref 5–14)
BLOOD UREA NITROGEN: 36 mg/dL — ABNORMAL HIGH (ref 9–23)
BUN / CREAT RATIO: 29
CALCIUM: 9.8 mg/dL (ref 8.7–10.4)
CHLORIDE: 107 mmol/L (ref 98–107)
CO2: 20.5 mmol/L (ref 20.0–31.0)
CREATININE: 1.24 mg/dL — ABNORMAL HIGH (ref 0.55–1.02)
EGFR CKD-EPI (2021) FEMALE: 43 mL/min/{1.73_m2} — ABNORMAL LOW (ref >=60)
GLUCOSE RANDOM: 181 mg/dL — ABNORMAL HIGH (ref 70–179)
POTASSIUM: 4.9 mmol/L — ABNORMAL HIGH (ref 3.4–4.8)
SODIUM: 142 mmol/L (ref 135–145)

## 2023-01-30 MED ADMIN — cefdinir (OMNICEF) capsule 300 mg: 300 mg | ORAL | @ 03:00:00 | Stop: 2023-02-03

## 2023-01-30 MED ADMIN — magnesium oxide (MAG-OX) tablet 400 mg: 400 mg | ORAL | @ 14:00:00 | Stop: 2023-01-30

## 2023-01-30 MED ADMIN — hydroxyurea (HYDREA) capsule 500 mg: 500 mg | ORAL | @ 01:00:00

## 2023-01-30 MED ADMIN — acetaminophen (TYLENOL) tablet 650 mg: 650 mg | ORAL | @ 10:00:00 | Stop: 2023-01-30

## 2023-01-30 MED ADMIN — aspirin chewable tablet 81 mg: 81 mg | ORAL | @ 14:00:00 | Stop: 2023-01-30

## 2023-01-30 MED ADMIN — hydroxyurea (HYDREA) capsule 500 mg: 500 mg | ORAL | @ 14:00:00 | Stop: 2023-01-30

## 2023-01-30 MED ADMIN — magnesium oxide (MAG-OX) tablet 400 mg: 400 mg | ORAL | @ 01:00:00 | Stop: 2023-01-31

## 2023-01-30 MED ADMIN — spironolactone (ALDACTONE) split tablet 12.5 mg: 12.5 mg | ORAL | @ 14:00:00 | Stop: 2023-01-30

## 2023-01-30 MED ADMIN — cefdinir (OMNICEF) capsule 300 mg: 300 mg | ORAL | @ 14:00:00 | Stop: 2023-01-30

## 2023-01-30 MED ADMIN — metoPROLOL succinate (Toprol-XL) 24 hr tablet 50 mg: 50 mg | ORAL | @ 01:00:00

## 2023-01-30 MED ADMIN — furosemide (LASIX) injection 40 mg: 40 mg | INTRAVENOUS | @ 14:00:00 | Stop: 2023-01-30

## 2023-01-30 MED ADMIN — LORazepam (ATIVAN) tablet 0.5 mg: .5 mg | ORAL | @ 01:00:00

## 2023-01-30 MED ADMIN — losartan (COZAAR) tablet 25 mg: 25 mg | ORAL | @ 20:00:00 | Stop: 2023-01-30

## 2023-01-30 MED ADMIN — fluticasone propionate (FLONASE) 50 mcg/actuation nasal spray 1 spray: 1 | NASAL | @ 14:00:00 | Stop: 2023-01-30

## 2023-01-30 MED ADMIN — atorvastatin (LIPITOR) tablet 10 mg: 10 mg | ORAL | @ 14:00:00 | Stop: 2023-01-30

## 2023-01-30 MED ADMIN — azithromycin (ZITHROMAX) tablet 250 mg: 250 mg | ORAL | @ 14:00:00 | Stop: 2023-01-30

## 2023-01-30 NOTE — Unmapped (Signed)
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 *      Outpt to do:  [ ]  consider repeat CT chest in 6-8 wks to ensure resolution of GGO  [ ]  stop meloxicam given CKD  [ ]  start SSRI/SNRI?    IVIE SAVITT is a 83 y.o. female with PMH HFrecEF, CAD, polycythemia vera, DM2, anxiety, CK3a who presented to [facility] with dyspnea, cough, palpitations x3-4 days on 01/27/2023, found to have CHF exacerbation with new reduction in EF.     Acute decompensated  HFrecEF  Pt presented with dyspnea and slight mild lower extremity edema. Admission pro-BNP 3,200. echo this admission with EF 30-35%, normal RV fxn. Last echo 06/2021 EF 55-60%, normal LV and RV fxn and prior to that echo 04/2021 EF 35-40%. Etiology likely d/t patient not taking her GDMT consistently (is filling most of them but reports she takes metop sometimes at night, furosemide 2x/week. Daughter reports pt feels better when SBP >120 and overall that pt has health anxiety) vs d/t tachyarrhythemia. Follows with Dr. Julio Alm. Dry weight ~170 lbs. Home meds: furosemide 20 PRN (uses 2x/week), lisinopril 30, spiro 25, metop 50. She was diuresed with furosemide and restarted on her home GDMT. Cardiology was consulted for decreased EF and recommended changing from lisinopril to losartan, otherwise continuing her current regimen. She has outpatient follow-up with cardiology in 4 weeks.      Upper respiratory tract infxn in mild intermittent asthma  Very small RUL GGO  Dyspnea 2/2 above  Chronic hypoxic respiratory failure (on 1-2L as needed)  Patient presented with cough, dyspnea, palpitations. CXR without consolidation. CTA negative for PE,  though did show a very small area of GGO in RUL. RPP and MRSA nares negative. Has been on room air this admission with SpO2 >92%. Per pt was placed on oxygen after an ER visit a few years ago and uses it as needed when she has dyspnea. She was treated with azithromycin + cefdinir for CAP coverage.      Sinus tachycardia  Palpitations  She reports palpitations for 1 to 2 weeks at home though has had these for years. HR this admission have been 90-120s. Admission EKG w/ sinus tachycardia. Likely due to underlying URI versus hypervolemia.     Procedures:  No admission procedures for hospital encounter.  ______________________________________________________________________  Discharge Medications:     Your Medication List        STOP taking these medications      lisinopril 30 MG tablet  Commonly known as: PRINIVIL,ZESTRIL            START taking these medications      azithromycin 250 MG tablet  Commonly known as: ZITHROMAX  Take 2 tablets (500 mg total) by mouth daily for 1 day, THEN 1 tablet (250 mg total) daily for 4 days.  Start taking on: January 27, 2023     losartan 25 MG tablet  Commonly known as: COZAAR  Take 1 tablet (25 mg total) by mouth daily.  Start taking on: January 31, 2023            CHANGE how you take these medications      furosemide 20 MG tablet  Commonly known as: LASIX  Take 1 tablet (20 mg total) by mouth 3 (three) times a week.  What changed: additional instructions     metoPROLOL succinate 50 MG 24 hr tablet  Commonly known as: Toprol-XL  TAKE 1 TABLET BY MOUTH AT BEDTIME  What changed: additional instructions            CONTINUE taking these medications      ACCU-CHEK GUIDE TEST STRIPS Strp  Generic drug: blood sugar diagnostic  by Other route Two (2) times a day (30 minutes before a meal).     albuterol 90 mcg/actuation inhaler  Commonly known as: PROVENTIL HFA;VENTOLIN HFA  Inhale 2 puffs every six (6) hours as needed for wheezing.     aspirin 81 MG tablet  Commonly known as: ECOTRIN  Take 1 tablet (81 mg total) by mouth daily.     atorvastatin 10 MG tablet  Commonly known as: LIPITOR  Take 1 tablet (10 mg total) by mouth daily.     benzonatate 100 MG capsule  Commonly known as: TESSALON  Take 2 capsules (200 mg total) by mouth Three (3) times a day as needed for cough.     blood-glucose meter kit  Use as instructed     calcium-vitamin D 500 mg-5 mcg (200 unit) per tablet  Take 1 tablet by mouth in the morning and 1 tablet in the evening. Take with meals.     cyanocobalamin 2000 MCG tablet  Take 1 tablet (2,000 mcg total) by mouth daily.     estradiol 0.01 % (0.1 mg/gram) vaginal cream  Commonly known as: ESTRACE  Insert 2 g into the vagina daily. Use externally prn     fluticasone propionate 50 mcg/actuation nasal spray  Commonly known as: FLONASE  Use 1 spray(s) in each nostril once daily     hydroxyurea 500 mg capsule  Commonly known as: HYDREA  Take 1 capsule (500mg ) by mouth TWICE daily Monday-Thursday, AND 1 capsule (500mg ) ONCE daily on Friday-Sunday.     ketoconazole 2 % cream  Commonly known as: NIZORAL  APPLY  CREAM TOPICALLY ONCE DAILY FOR SCALP RASH     lancets Misc  Commonly known as: ACCU-CHEK SOFTCLIX LANCETS  USE AS DIRECTED TO  CHECK  GLUCOSE  TWICE  DAILY     lancing device Misc  USE TO CHECK BLOOD SUGAR 2 TIMES A DAY     LORazepam 0.5 MG tablet  Commonly known as: ATIVAN  TAKE 1/2 (ONE-HALF) TABLET BY MOUTH TWICE DAILY AS NEEDED FOR ANXIETY     meloxicam 7.5  MG tablet  Commonly known as: MOBIC  Take 1 tablet (7.5 mg total) by mouth daily.     metFORMIN 500 MG tablet  Commonly known as: GLUCOPHAGE  TAKE 1 TABLET BY MOUTH IN THE MORNING AND 1/2 (ONE-HALF) ONCE DAILY WITH SUPPER     nystatin 100,000 unit/gram cream  Commonly known as: MYCOSTATIN  Apply topically two (2) times a day.     RESTASIS 0.05 % ophthalmic emulsion  Generic drug: cycloSPORINE  INSTILL 1 DROP INTO EACH EYE TWICE DAILY     spironolactone 25 MG tablet  Commonly known as: ALDACTONE  Take 1 tablet (25 mg total) by mouth daily.     traMADol 50 mg tablet  Commonly known as: ULTRAM  Take 0.5 tablets (25 mg total) by mouth daily as needed for pain.     triamcinolone 0.1 % cream  Commonly known as: KENALOG  Apply 1 Application topically Two (2) times a day (at 8am and 12:00).              Allergies:  Amoxicillin, Cephalexin, Ciprofloxacin, Hydrocodone-acetaminophen, and Sulfamethoxazole  ______________________________________________________________________  Pending Test Results (if blank, then none):  None    Most Recent Labs:  Lab Results   Component Value Date    WBC 3.1 (L) 01/30/2023    RBC 3.48 (L) 01/30/2023    HGB 12.9 01/30/2023    HCT 39.0 01/30/2023    MCV 112.1 (H) 01/30/2023    MCH 37.1 (H) 01/30/2023    MCHC 33.1 01/30/2023    RDW 13.6 01/30/2023    PLT 168 01/30/2023    MPV 8.5 01/30/2023     Lab Results   Component Value Date    NA 142 01/30/2023    K 4.9 (H) 01/30/2023    CL 107 01/30/2023    CO2 20.5 01/30/2023    BUN 36 (H) 01/30/2023    CREATININE 1.24 (H) 01/30/2023    GLU 181 (H) 01/30/2023    CALCIUM 9.8 01/30/2023    ALBUMIN 3.8 01/29/2023    PHOS 4.5 01/28/2023       Relevant Studies/Radiology (if blank, then none):  Echocardiogram Follow Up/Limited Echo With Contrast  Result Date: 01/30/2023  Patient Info Name: KINLIE JANICE Age:     82 years DOB:     30-Apr-1940 Gender:     Female MRN:     [id number] Accession #:     [id number] UN Account #:     [id number] Ht:     158 cm Wt:     78 kg BSA:     1.88 m2 BP:     128 /     65 mmHg HR:     67 bpm Exam Date:     01/29/2023 12:16 PM Admit Date:     01/27/2023 Exam Type:     ECHOCARDIOGRAM FOLLOW UP/LIMITED ECHO WITH CONTRAST Technical Quality:     Poor Reason for Poor Study:     poor echocardiographic windows Staff Sonographer:     Glean Salen Reading Fellow:     Virgel Bouquet Study Info Indications      - Check EF ; Definity/Optison Procedure(s)   Limited 2D, color flow and Doppler transthoracic echocardiogram is performed. Ultrasound Enhancing Agent/Agitated Saline ------------------------------ UEA/Ag. Saline:     Definity Amount:     3.00 ml Existing IV Access:     Yes IV Access Condition:     patent with no signs of infiltration Summary   1. Technically difficult study.  2. The left ventricle is normal in size with mildly increased wall thickness.   3. The left ventricular systolic function is moderately decreased, LVEF is visually estimated at 30-35%.   4. 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   5. The right ventricle is normal in size, with normal systolic function.   6. Compared to prior echocardiogram 08/2021, left ventricular systolic function has decreased, aortic stenosis is now present. Left Ventricle   The left ventricle is normal in size with mildly increased wall thickness. The left ventricular systolic function is moderately decreased, LVEF is visually estimated at 30-35%. Left ventricular diastolic function cannot be accurately assessed. Right Ventricle   The right ventricle is normal in size, with normal systolic function. Ventricular Septum   The ventricular septum is sigmoid shaped. Left Atrium   The left atrium is not well visualized but probably normal in size. Right Atrium   The right atrium is not well visualized. Aortic Valve   There is no significant aortic regurgitation. The aortic valve is probably trileaflet with moderately thickened leaflets with moderately reduced excursion. 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 Peak transvalvular velocity:  2.3 m/s. Mean gradient: 12 mmHg. Doppler velocity index: 0.34. LVOT stroke volume: 50 ml. LVOT stroke volume index: 27 ml/m2. Mitral Valve   The mitral valve leaflets are normal with normal leaflet mobility. Mitral annular calcification is present. There is no significant mitral valve regurgitation. Tricuspid Valve   The tricuspid valve is not well visualized. There is no significant tricuspid regurgitation. Pulmonic Valve   Pulmonary valve is not well visualized. There is no significant pulmonic regurgitation. Aorta   The aorta is normal in size in the visualized segments. Pericardium/Pleural   There is no pericardial effusion. Other Findings   Rhythm: Sinus Rhythm. Ventricles ---------------------------------------------------------------------- Name                                 Value        Normal ---------------------------------------------------------------------- LV Dimensions 2D/MM ----------------------------------------------------------------------  IVS Diastolic Thickness (2D)                                1.9 cm       0.6-0.9 LVID Diastole (2D)                  4.2 cm       3.8-5.2  LVPW Diastolic Thickness (2D)                                1.2 cm       0.6-0.9 LVID Systole (2D)                   3.5 cm       2.2-3.5 LVOT Diameter                       2.0 cm               LV Mass Index (2D Cubed)          140 g/m2         43-95  Relative Wall Thickness (2D)  0.57        <=0.42 RV Dimensions 2D/MM ---------------------------------------------------------------------- TAPSE                               2.3 cm         >=1.7 Atria ---------------------------------------------------------------------- Name Value        Normal ---------------------------------------------------------------------- LA Dimensions ---------------------------------------------------------------------- LA Dimension (2D)                   3.7 cm       2.7-3.8 LA Volume Index (2C A-L)        16.69 ml/m2 Left Ventricular Outflow Tract ---------------------------------------------------------------------- Name                                 Value        Normal ---------------------------------------------------------------------- LVOT 2D ---------------------------------------------------------------------- LVOT Diameter                       2.0 cm               LVOT Area                          3.1 cm2               LVOT Doppler ---------------------------------------------------------------------- LVOT Peak Velocity                 0.8 m/s               LVOT VTI                             16 cm               LVOT Stroke Volume                   50 ml               LVOT SI                           27 ml/m2 Aortic Valve ---------------------------------------------------------------------- Name                                 Value        Normal ---------------------------------------------------------------------- AV Doppler ---------------------------------------------------------------------- AV Peak Velocity                   2.3 m/s               AV Peak Gradient                   21 mmHg               AV Mean Gradient                   12 mmHg               AV VTI                               45 cm  AV Area (Cont Eq VTI)              1.1 cm2         >=3.0 AV Area Index (Cont Eq VTI)     0.6 cm2/m2               AV Area (Cont Eq Vel)              1.1 cm2               AV Area Index (Cont Eq Vel)     0.6 cm2/m2               AV DI (Vel)                           0.34               AV DI (VTI)                           0.36 Mitral Valve ---------------------------------------------------------------------- Name                                 Value        Normal ---------------------------------------------------------------------- MV Diastolic Function ---------------------------------------------------------------------- MV E Peak Velocity                 75 cm/s               MV A Peak Velocity                110 cm/s               MV E/A                                 0.7               MV Annular TDI ---------------------------------------------------------------------- MV Septal e' Velocity             2.9 cm/s         >=8.0 MV E/e' (Septal)                      25.5               MV Lateral e' Velocity            5.2 cm/s        >=10.0 MV E/e' (Lateral)                     14.3               MV e' Average                     4.1 cm/s               MV E/e' (Average)                     19.9 Aorta ---------------------------------------------------------------------- Name                                 Value        Normal ----------------------------------------------------------------------  Ascending Aorta ---------------------------------------------------------------------- Ao Root Diameter (2D)               2.9 cm               Ao Root Diam Index (2D)          1.5 cm/m2 Venous ---------------------------------------------------------------------- Name                                 Value        Normal ---------------------------------------------------------------------- IVC/SVC ---------------------------------------------------------------------- IVC Diameter (Exp 2D)               1.8 cm         <=2.1 Report Signatures Finalized by Madaline Savage  MD on 01/30/2023 02:26 PM Resident Virgel Bouquet on 01/29/2023 03:48 PM    ECG 12 Lead  Result Date: 01/29/2023  SINUS RHYTHM WITH 1ST DEGREE AV BLOCK WITH OCCASIONAL PREMATURE VENTRICULAR BEATS LEFT AXIS DEVIATION LEFT BUNDLE BRANCH BLOCK ABNORMAL ECG WHEN COMPARED WITH ECG OF 27-Jan-2023 19:40, PREMATURE VENTRICULAR BEATS ARE NOW PRESENT Confirmed by Vickey Huger [phone]) on 01/29/2023 8:28:40 PM    ECG 12 Lead  Result Date: 01/29/2023  SINUS RHYTHM WITH 1ST DEGREE AV BLOCK LEFT AXIS DEVIATION LEFT BUNDLE BRANCH BLOCK ABNORMAL ECG WHEN COMPARED WITH ECG OF 27-Jan-2023 17:28, PREMATURE VENTRICULAR BEATS ARE NO LONGER PRESENT Confirmed by Vickey Huger [phone]) on 01/29/2023 2:13:58 PM    ECG 12 Lead  Result Date: 01/29/2023  SINUS TACHYCARDIA LEFT AXIS DEVIATION INTRAVENTRICULAR CONDUCTION DELAY MINIMAL VOLTAGE CRITERIA FOR LVH, MAY BE NORMAL VARIANT ( [employer] product ) POSSIBLE LATERAL INFARCT  , AGE UNDETERMINED ABNORMAL ECG WHEN COMPARED WITH ECG OF 27-Jan-2023 23:52, PREMATURE VENTRICULAR BEATS ARE NO LONGER PRESENT Confirmed by Warnell Forester ([id number]) on 01/29/2023 1:42:28 PM    CTA Chest W Contrast  Result Date: 01/27/2023  EXAM: CTA CHEST W CONTRAST ACCESSION: [id number] UN REPORT DATE: 01/27/2023 8:44 PM CLINICAL INDICATION: sob, tachycardia, rising trop, unilateral leg swelling, concern for pe TECHNIQUE: Contiguous axial images were reconstructed through the chest following a single breath hold helical acquisition during the administration of intravenous contrast material. Images were reformatted in the coronal and sagittal planes. MIP slabs were also constructed. COMPARISON: Chest CT 02/14/2021. FINDINGS: PULMONARY ARTERIES: No emboli in either lung. Main pulmonary artery is normal in size. HEART AND VASCULATURE: Cardiac chambers are normal in size. Moderate coronary artery calcifications. No pericardial effusion. Normal caliber aorta with moderate calcifications. LUNGS, AIRWAYS, AND PLEURA: Area of groundglass opacity within the right upper lobe (7:38). Bibasilar atelectasis. Central airways are patent. No pleural effusion or pneumothorax. MEDIASTINUM AND LYMPH NODES: No enlarged intrathoracic, axillary, or supraclavicular lymph nodes. No mediastinal mass or other abnormality. CHEST WALL AND BONES: Vertebral augmentation of T11. UPPER ABDOMEN: Unremarkable. OTHER: No other findings.     1.  No pulmonary embolism. 2.  Area of groundglass opacity within the right upper lobe which may be infectious in etiology. Consider follow-up CT in 6 to 8 weeks to ensure resolution.     XR Chest 2 views  Result Date: 01/27/2023  EXAM: XR CHEST 2 VIEWS ACCESSION: [id number] UN REPORT DATE: 01/27/2023 6:20 PM CLINICAL INDICATION: SHORTNESS OF BREATH  TECHNIQUE: PA and Lateral Chest Radiographs. COMPARISON: Chest radiograph 10/14/2021 FINDINGS: Subsegmental bibasilar atelectasis. No focal consolidation. No pleural effusion or pneumothorax. Normal heart size. Tortuous thoracic aorta. Prior vertebral body cement augmentation.     No evidence of acute focal  consolidation.     ECG 12 Lead  Result Date: 01/27/2023  SINUS RHYTHM WITH OCCASIONAL PREMATURE VENTRICULAR BEATS LEFT AXIS DEVIATION LEFT BUNDLE BRANCH BLOCK WHEN COMPARED WITH ECG OF 17-Mar-2022 09:54, PREMATURE VENTRICULAR BEATS ARE NOW PRESENT Confirmed by Schuyler Amor ([id number]) on 01/27/2023 6:07:34 PM   ______________________________________________________________________  Discharge Instructions:     Diet Instructions       Discharge diet (specify)      Discharge Nutrition Therapy: Regular            Follow Up instructions and Outpatient Referrals     Call MD for:  difficulty breathing, headache or visual disturbances      Call MD for:  persistent dizziness or light-headedness      Call MD for:  persistent nausea or vomiting      Call MD for:  severe uncontrolled pain      Call MD for:  temperature >38.5 Celsius      Discharge instructions          Other Instructions       Call MD for:  difficulty breathing, headache or visual disturbances      Call MD for:  persistent dizziness or light-headedness      Call MD for:  persistent nausea or vomiting      Call MD for:  severe uncontrolled pain      Call MD for:  temperature >38.5 Celsius      Discharge instructions      You were admitted to the hospital for an upper respiratory infection as well as retaining fluid. You need to complete a course of antibiotic (azithromycin) to treat the infection. We found on your ultrasound of your heart that the squeeze of your heart is reduced, which causes fluid retention. Dr. Julio Alm wants you to STOP taking lisinopril and START taking losartan. He will see you in the clinic to talk about any additional medication changes you may need.            Appointments which have been scheduled for you      Feb 15, 2023 9:20 AM  (Arrive by 9:05 AM)  RETURN CONTINUITY with [name] Mangel, DO  [facility] PRIMARY CARE [address] AT [facility] REGION) [address] [place] [address]  [phone]        Mar 05, 2023 4:20 PM  (Arrive by 4:05 PM)  RETURN CARDIOLOGY with Ardeth Sportsman, MD  [facility] [address] [facility] REGION) [address]  [place] [address]  [phone]        Mar 13, 2023 3:00 PM  (Arrive by 2:30 PM)  LAB ONLY [place] with ADULT ONC LAB  [facility] ADULT ONCOLOGY LAB DRAW STATION [place] [facility] REGION) [address]  [place] [address]  [phone]        Mar 13, 2023 4:00 PM  (Arrive by 3:30 PM)  RETURN ACTIVE [place] with Halford Decamp, MD  [facility] HEMATOLOGY ONCOLOGY 2ND FLR [facility] H[facility]P [facility] REGION) [address]  [place] [address]  [phone]             ______________________________________________________________________  Discharge Day Services:  BP 106/55  - Pulse 82  - Temp 36.2 ??C (97.1 ??F) (Temporal)  - Resp 18  - Wt 78.1 kg (172 lb 1.6 oz)  - LMP  (LMP Unknown)  - SpO2 95%  - BMI  31.48 kg/m??   Pt seen on the day of discharge and determined appropriate for discharge.    Condition at Discharge: good    Length of Discharge: I spent greater than 30 mins in the discharge of this patient.

## 2023-01-30 NOTE — Unmapped (Signed)
OCCUPATIONAL THERAPY  Evaluation (01/30/23 1028)    Patient Name:  Cindy Gonzalez       Medical Record Number: [id number]     Date of Birth: Dec 09, 1940  Sex: Female      Post-Discharge Occupational Therapy Recommendations: Skilled OT services NOT indicated          Equipment Recommendation  OT DME Recommendations: None       OT Treatment Diagnosis:      SOB, CHF exacerbation impacting ADLs, self care, and mobility    Assessment  Problem List: Fall risk, Impaired ADLs, Decreased activity tolerance, Decreased endurance, Decreased mobility, Shortness of breath       Assessment: Cindy Gonzalez is a 83 y.o. female with PMH HFrecEF, CAD, polycythemia vera, DM2, anxiety, CK3a who presented to [facility] with dyspnea, cough, palpitations x3-4 days on 01/27/2023, found to have CHF exacerbation with new reduction in EF.     Cindy Gonzalez presents to acute OT services near functional baseline with above stated deficits, slightly decreased endurance impacting functional Independence and safety with ADL routines. Pt reports Mod I at baseline for all I/ADLs, uses cane within apartment and complex and for short appointments, Rollator for longer distances when son can manage in/out of car. Pt demonstrated Mod I level functional transfers and mobility (within room and hallway) and ADLs (toileting, LBD, grooming) this date using Rollator. Pt demonstrates excellent safety awareness re: rollator brakes, and utilizes storage compartment well and appropriately. Pt receptive to education re: energy conservation techniques, and reports she plans to have son assist with grocery shopping and other higher-level activities to maximize her activity tolerance post-discharge.    No further acute or post-acute OT needs identified this date. Review of client factors, occupational history, assessment of occupational performance, and development of POC required Low complexity OT evaluation.       Today's Interventions: AMPAC 23/24, role of OT, POC, functional transfers and mobility, LBD, toileting, grooming, standing tolerance and balance, endurance, activity tolerance, pt education re: energy conservation techniques    Activity Tolerance During Today's Session  Tolerated treatment well    Plan  Planned Frequency of Treatment:    D/C Services Weekly Frequency: D/C Services     GOALS:   Patient and Family Goals: Let son help her more to maximize activity tolerance.      Prognosis:  Excellent  Positive Indicators:  PLOF, motivation, family support (though not 24/7)  Barriers to Discharge: None    Subjective    Prior Functional Status Pt reports Mod I at baseline using cane for most mobility within apartment and apartment building, uses Rollator for longer distances and when son drives her to appointments (he is able to manage device in/out of car). Pt plays bingo and attends prayer circle 1x/wk with apartment residents. Pt completes all IADLs, including laundry, grocery shopping, and cooking, Mod Independently with use of cane.    Medical Tests / Procedures: Reviewed       Patient / Caregiver reports: I don't want to NOT have what I like to eat before I die, you know?      Past Medical History:   Diagnosis Date    At risk for falls     left ankle arthritis; cane     Coronary artery disease     Diabetes mellitus (CMS-HCC)     Diffuse large B cell lymphoma (CMS-HCC)     Hypertension     Impaired mobility     left ankle arthritis, cane  Visual impairment     glasses    Social History     Tobacco Use    Smoking status: Never    Smokeless tobacco: Never   Substance Use Topics    Alcohol use: Not Currently      Past Surgical History:   Procedure Laterality Date    CHOLECYSTECTOMY      HYSTERECTOMY      OOPHORECTOMY      Family History   Problem Relation Age of Onset    Cancer Other     Diabetes Mother     Asthma Mother     Hypertension Father     Stroke Father         Amoxicillin, Cephalexin, Ciprofloxacin, Hydrocodone-acetaminophen, and Sulfamethoxazole     Objective Findings  Precautions / Restrictions  Falls precautions, Delirium Precautions       Weight Bearing  Non-applicable    Required Braces or Orthoses  Non-applicable    Communication Preference  Verbal       Pain  No c/o pain this session    Equipment / Environment  Vascular access (PIV, TLC, Port-a-cath, PICC), Telemetry    Living Situation  Living Environment: Senior apartment  Lives With: Alone  Home Living: One level home, Tub/shower unit, Handicapped height toilet, Grab bars in shower, Grab bars around toilet, Shower chair with back, Level entry  Caregiver Identified?: Yes  Caregiver Availability: Intermittent (son nearby)  Caregiver Ability: Limited lifting  Equipment available at home: Rollator, Straight cane (shower chair)     Cognition   Orientation Level:  Oriented x 4   Arousal/Alertness:  Appropriate responses to stimuli   Attention Span:  Appears intact   Memory:  Appropriate for age   Following Commands:  Follows all commands and directions without difficulty   Safety Judgment:  Good awareness of safety precautions   Awareness of Errors and Problem Solving:  Patient self-corrected errors, Able to problem solve independently   Comments:      Vision / Hearing   Vision: No acute deficits identified, Wears glasses for reading only, Glasses present     Hearing: Hearing impairment   Hearing: Slightly HOH, WNL for age     Hand Function:  Right Hand Function: Right hand grip strength, ROM and coordination WNL  Left Hand Function: Left hand grip strength, ROM and coordination WNL  Hand Dominance: Right    Skin Inspection:  Skin Inspection: Intact where visualized    Face/Cervical ROM:  Face ROM: WFL  Cervical ROM: WFL    ROM / Strength:  UE ROM/Strength: Left WFL, Right WFL  LE ROM/Strength: Left WFL, Right WFL  LE ROM/ Strength Comment: Acheives figure four position    Coordination:  Coordination: WFL    Sensation:  RUE Sensation: RUE intact  LUE Sensation: LUE intact  RLE Sensation: RLE intact  LLE Sensation: LLE intact    Balance:  Static Sitting-Level of Assistance: Independent  Dynamic Sitting-Level of Assistance: Independent  Sitting Balance comments: at recliner and toilet    Static Standing-Level of Assistance: Independent  Dynamic Standing - Level of Assistance: Supervision  Standing Balance comments: using Rollator    Functional Mobility  Transfers: Modified Independent (sit<>stand from recliner and toilet with Rollator)  Bed Mobility - Needs Assistance:  (NT (received/left in recliner))  Ambulation: Functional mobility within room, bathroom, and hallway ~166ft total Mod I using Rollator. Pt Independently locked/unlocked brakes appropriately.    ADLs  ADLs: Modified Independent (Pt doff/donned bilateral socks Mod I seated  in recliner, gown management Mod I, and grooming at sink Mod I with Rollator. Mod I toileting tasks. Anticipate Supervision for initial shower for safety.)    Vitals / Orthostatics  Vitals/Orthostatics: HR 110-115 at rest and with activity. SOB noted throughout exertion, however SpO2 96-98% on room air and pt did not report SOB (stated this to be near baseline, improved from admission).    Patient at end of session: All needs in reach, In chair, Nurse notified     Occupational Therapy Session Duration  OT Individual [mins]: 25       AM-PAC-Daily Activity  Lower Body Dressing assistance needs: None - Modified Independent/Independent  Bathing assistance needs: A Little - Minimal/Contact Guard Assist/Supervision  Toileting assistance needs: None - Modified Independent/Independent  Upper Body Dressing assistance needs: None - Modified Independent/Independent  Personal Grooming assistance needs: None - Modified Independent/Independent  Eating Meals assistance needs: None - Modified Independent/Independent    Daily Activity Score: 23    Score (in points): % of Functional Impairment, Limitation, Restriction  6: 100% impaired, limited, restricted  7-8: At least 80%, but less than 100% impaired, limited restricted  9-13: At least 60%, but less than 80% impaired, limited restricted  14-19: At least 40%, but less than 60% impaired, limited restricted  20-22: At least 20%, but less than 40% impaired, limited restricted  23: At least 1%, but less than 20% impaired, limited restricted  24: 0% impaired, limited restricted      I attest that I have reviewed the above information.  Signed: Jessica Priest, OT  [facility] 01/30/2023

## 2023-01-30 NOTE — Unmapped (Signed)
Patient alert and oriented x4. C/o general pain this tour. Tylenol given per prn order with effective results. Bed remains in the lowest position, wheels locked and call bell placed within reach.   Problem: Adult Inpatient Plan of Care  Goal: Plan of Care Review  Outcome: Progressing  Goal: Patient-Specific Goal (Individualized)  Outcome: Progressing  Goal: Absence of Hospital-Acquired Illness or Injury  Outcome: Progressing  Intervention: Identify and Manage Fall Risk  Recent Flowsheet Documentation  Taken 01/29/2023 2215 by Arlys John D, RN  Safety Interventions:   low bed   fall reduction program maintained  Taken 01/29/2023 2005 by Laurence Spates, RN  Safety Interventions: fall reduction program maintained  Goal: Optimal Comfort and Wellbeing  Outcome: Progressing  Goal: Readiness for Transition of Care  Outcome: Progressing  Goal: Rounds/Family Conference  Outcome: Progressing

## 2023-01-30 NOTE — Unmapped (Signed)
PHYSICAL THERAPY  Evaluation (01/30/23 1429)          Patient Name:  Cindy Gonzalez       Medical Record Number: [id number]   Date of Birth: 1940-05-31  Sex: Female        Post-Discharge Physical Therapy Recommendations:  PT Post Acute Discharge Recommendations: Skilled PT services NOT indicated   Equipment Recommendation  PT DME Recommendations: None          Treatment Diagnosis: Abnormalities of gait and mobility, Difficulty in walking, Unsteadiness on feet, Generalized muscle weakness  Treatment Diagnosis: Weakness     Activity Tolerance: Tolerated treatment well     ASSESSMENT  Problem List: Decreased mobility      Assessment : Cindy Gonzalez is a 83 y.o. female with PMH HFrecEF, CAD, polycythemia vera, DM2, anxiety, CK3a who presented to [facility] with dyspnea, cough, palpitations x3-4 days on 01/27/2023, found to have CHF exacerbation with new reduction in EF.     Prior to admission pt was mod I w rollator for functional mobility, regularly active in community, still drives. Pt currently mobilizing at/near baseline function, performing functional mobility at Mod I with rollator including STS transfers, x250 ft ambulation, and toileting/pericare task. Safe and stable gait pattern noted, good safety awareness, no instances of LOB. Given CLOF, no post-acute needs indicated. PT signing off. Please reconsult if any changes in functional status arise       Today's Interventions: Balance activities, Endurance activities, Gait training, Patient/Family/Caregiver Education, Positioning, Therapeutic activity  Today's Interventions: PT eval, STS transfers, amb, Ed re: POC, goals of care, falls prevention, progressive mobility     Personal Factors/Comorbidities Present: No personal factors   Examination of Body System: Musculoskeletal, Activity/participation, Cardiovascular, Pulmonary  Clinical Presentation: Stable    Clinical Decision Making: Low        PLAN  Planned Frequency of Treatment:    D/C Services Weekly Frequency: D/C Services        Planned Interventions:       Goals:   Patient and Family Goals: safe dispo    Prognosis:  Good  Positive Indicators: PLOF, CLOF  Barriers to Discharge: None     SUBJECTIVE  Communication Preference: Verbal,     Patient reports: agreeable to PT        Prior Functional Status: mod I w rollator for home and community ambulation; still driving; lives alone but family live nearby that can check in  Equipment available at home: Rollator, Straight cane        Past Medical History:   Diagnosis Date    At risk for falls     left ankle arthritis; cane     Coronary artery disease     Diabetes mellitus (CMS-HCC)     Diffuse large B cell lymphoma (CMS-HCC)     Hypertension     Impaired mobility     left ankle arthritis, cane    Visual impairment     glasses            Social History     Tobacco Use    Smoking status: Never    Smokeless tobacco: Never   Substance Use Topics    Alcohol use: Not Currently       Past Surgical History:   Procedure Laterality Date    CHOLECYSTECTOMY      HYSTERECTOMY      OOPHORECTOMY               Family History  Problem Relation Age of Onset    Cancer Other     Diabetes Mother     Asthma Mother     Hypertension Father     Stroke Father         Allergies: Amoxicillin, Cephalexin, Ciprofloxacin, Hydrocodone-acetaminophen, and Sulfamethoxazole                  Objective Findings  Precautions / Restrictions  Precautions: Falls precautions, Delirium Precautions  Weight Bearing Status: Non-applicable  Required Braces or Orthoses: Non-applicable     Pain Comments: no c/o pain  Medical Tests / Procedures: Reviewed in Epic  Equipment / Environment: Vascular access (PIV, TLC, Port-a-cath, PICC), Telemetry     Vitals/Orthostatics : VSS per Tele, NAD     Living Situation  Living Environment: Senior apartment  Lives With: Alone  Home Living: One level home, Tub/shower unit, Handicapped height toilet, Grab bars in shower, Grab bars around toilet, Shower chair with back, Level entry Cognition: WFL  Cognition comment: alert and conversational  Visual/Perception: Within Functional Limits  Hearing: No deficit identified     Skin Inspection: Intact where visualized     Upper Extremities  UE ROM: Right WFL, Left WFL  UE Strength: Right WFL, Left WFL    Lower Extremities  LE ROM: Right WFL, Left WFL  LE Strength: Right WFL, Left WFL          Sensation: WFL    Static Sitting-Level of Assistance: Independent  Dynamic Sitting-Level of Assistance: Archivist Standing-Level of Assistance: Independent  Dynamic Standing - Level of Assistance: Independent  Standing Balance comments: mod I w rollator      Bed Mobility comments: NT, received and concluded in chair     Transfers: Sit to Stand  Sit to Stand assistance level: Modified independent, requires aide device or extra time  Transfer comments: w rollator, adequate safety awareness to lock brakes. From recliner and elevated commode heights      Gait Level of Assistance: Modified independent, requires aide device or extra time  Gait Distance Ambulated (ft): 250 ft  Skilled Treatment Performed: adequate reciprocal pattern with rollator, good gait speed. No LOB. No signs of SOB     Stairs: NA            Endurance: good    Patient at end of session: All needs in reach, In chair, Friends/Family present, Lines intact    Physical Therapy Session Duration  PT Individual [mins]: 24          AM-PAC-6 click  Help currently need turning over In bed?: None - Modified Independent/Independent  Help currently needed sitting down/standing up from chair with arms? : None - Modified Independent/Independent  Help currently needed moving from supine to sitting on edge of bed?: None - Modified Independent/Independent  Help currently needed moving to and from bed from wheelchair?: None - Modified Independent/Independent  Help currently needed walking in a hospital room?: None - Modified Independent/Independent            6 click Score (in points): % of Functional Impairment, Limitation, Restriction  6: 100% impaired, limited, restricted  7-8: At least 80%, but less than 100% impaired, limited restricted  9-13: At least 60%, but less than 80% impaired, limited restricted  14-19: At least 40%, but less than 60% impaired, limited restricted  20-22: At least 20%, but less than 40% impaired, limited restricted  23: At least 1%, but less than 20% impaired, limited restricted  24:  0% impaired, limited restricted        I attest that I have reviewed the above information.  Signed: Trena Platt, PT  Filed 01/30/2023

## 2023-01-30 NOTE — Unmapped (Signed)
Cardiology Consult Note  [facility] Cardiology Service    Requesting Attending Physician :  Kathryne Hitch, MD  Service Requesting Consult : [facility]: Hospitalist Service #2  Date of Service: January 30, 2023   Code Status:   Orders Placed This Encounter   Procedures    DNR (Do Not Resuscitate) and DNI (Do Not Intubate)     Standing Status:   Standing     Number of Occurrences:   1      ID: Cindy Gonzalez is a 83 y.o. female with a past medical history significant for CAD, HFpEF (on home O2 PRN), HTN, HLD, T2DM is currently admitted for 1 week of waxing/waning shortness of breath, palpitations, subjective fevers, cough who has been consulted by Dr. Ruffin Pyo for progressed HFrEF and ischemic workup evaluation     Assessment/Plan:   Acute decompensated  HFrEF I Primary Essential HTN I LBBB I Mild aortic stenosis  Pt presented on 01/27/23 with progressive dyspnea and SOB for 1 week prior to admission. Pt is on 1-2L O2 at home as baseline with underlying pre-exiting COPD, HFrEF, asthma, and Anxiety. Pro-bnp this admission 3,264.0 and hsTroponin I 23>67>45, peaked then down-trended at 45. Upon assessment this morning this morning she appears to be euvolemic, lungs clear upon auscultation, mild JVD, trace of BLE edema, and mild aortic stenosis.  She denies chest pain, palpitations, SOB, syncope or pre-syncope Echo echo this admission with EF 30-35%, normal RV fxn. Last echo 06/2021 EF 55-60%, normal LV and RV fxn and prior to that echo 04/2021 EF 35-40%, and AV mildly thickened but no significant gradient.   Pt with GDMT prescribed by her cardiologist Dr. Julio Alm including(metop, lisinopril, spironolactone, no previous tolerance to Jardiance (previously reported nausea, dizziness, knee weakness, and generalized fatigue) as per her Cardiologist notes on 09/01/20) , however, pt reports not taking her GDMT regurlay at home. Since patients reports no chest pain, admission hs troponin I WNL, normotensive, VSS, continue to be on RA, with no complains of SOB, warrants no need for ischemic work-up at the moment, however, pt will need to be regularly taking her GDMT as prescribed. Will recommend to stop lisinopril and switch to 5 mg losartan daily.     - Will continue to hold on Jardiance since no tolerance noted previously as above   - Continue Home Metoprolol 50 mg XL Nightly     - Continue spironolactone 12.5 mg daily  - Stop 10 mg lisinopril daily and switch to 25 mg losartan daily instead, for the purpose of switching to [facility] as an outpatient   - Discontinue Furosemide IV 40 mg daily and resume PO Furosemide 20 mg 3 times a week  - Patient has an appointment with her cardiologist on March 05, 2023 as a regular follow-up    CAD non-obstructive (involving native coronary artery of native heart without angina pectoris ) I HLD   - Continue home 10mg  atorvastatin daily   - LDL (target LDL<70) LDL on 06/14/22 81  - Continue home aspirin 81 mg daily     Thank you for consulting the medicine Cardiology team. Please do not hesitate to contact me with any questions.    Reason for Consult   Cindy Gonzalez is a 83 y.o. female who is presenting to [facility] with Shortness of breath, cough, and palpitations x 3-4 days.  Reports she was in her usual state of health until about 4-5 days before admission 01/27/23. States she developed symptoms of the nose cough  and congestion, that have been worsening days prior to admission. Developed more persistent cough, SOB, and was having a racing heart more frequently. Reports dry weight is around 172-173lbs. 175 at admission.     The patient follows up with [facility] cardiology since 2022. She has a history of heart failure with mildly reduced EF since 2022. She has been on GDMT since then, however, reports inconsistency in medication regimen. Her Echo this admission showed a progressed reduced EF to 30-35% and was consulted by cardiology for evaluation of potential ischemic workup ISO of h/o of CAD and mild Aortic stenosis.     Subjective:     24 hour events  NAEON    The patient states she feels stable.  Shortness of breath.  Is been on room air while on the 94%.  Upon auscultation lungs are clear.  Mild JVD and trace of bilateral lower edema    Allergies:   Allergies   Allergen Reactions    Amoxicillin      Other Reaction(s): Unknown    Cephalexin Palpitations    Ciprofloxacin Palpitations     Legacy System: Abran Cantor    Onset Date: <blank>    Substance Legacy/Cerner: Cipro / Cipro (Legacy value)    Category: Drug    Severity Legacy/Cerner: <blank> / Unknown    Reaction(s): makes head feel funny  nausea    Comments: <blank>    Hydrocodone-Acetaminophen Nausea And Vomiting and Other (See Comments)    Sulfamethoxazole Palpitations     Medications:   Scheduled Meds:   aspirin  81 mg Oral Daily    atorvastatin  10 mg Oral Daily    azithromycin  250 mg Oral Daily    cefdinir  300 mg Oral Q12H SCH    fluticasone propionate  1 spray Each Nare Daily    furosemide  40 mg Intravenous Daily    heparin (porcine) for subcutaneous use  5,000 Units Subcutaneous Q8H [facility]    hydroxyurea  500 mg Oral 2 times per day on Monday Tuesday Wednesday Thursday    And    [START ON 02/01/2023] hydroxyurea  500 mg Oral Once per day on Sunday Friday Saturday    lisinopril  10 mg Oral Daily    magnesium oxide  400 mg Oral BID    [Provider Hold] meloxicam  7.5 mg Oral Daily    metoPROLOL succinate  50 mg Oral Nightly    senna  1 tablet Oral Nightly    spironolactone  12.5 mg Oral Daily     Code Status:  DNR and DNI    Review of Systems: 10 point review negative except for stated above    Objective:     Physical Exam:  VITAL SIGNS: Temp:  [35.9 ??C (96.6 ??F)-36.2 ??C (97.1 ??F)] 36.2 ??C (97.1 ??F)  Heart Rate:  [60-111] 60  Resp:  [16-20] 18  BP: (102-132)/(55-94) 106/55  SpO2:  [89 %-97 %] 95 %    Physical Exam   Constitutional:       General: She is not in acute distress.     Appearance: She is not toxic-appearing.   Cardiovascular:      Rate and Rhythm: Normal rate and regular rhythm.      Heart sounds: Murmur (systolic murmurs) heard.   Pulmonary:      Effort: Pulmonary effort is normal. No respiratory distress.      Breath sounds: Normal breath sounds. No wheezing.   Abdominal:      General: Abdomen is flat.  There is no distension.      Palpations: Abdomen is soft.      Tenderness: There is no abdominal tenderness.   Skin:     General: Skin is warm and dry.   Neurological:      Mental Status: She is alert.      Comments: Oriented to person, time, and situation     Test Results  [facility] 01/28/23  SINUS TACHYCARDIA  LEFT AXIS DEVIATION  INTRAVENTRICULAR CONDUCTION DELAY  MINIMAL VOLTAGE CRITERIA FOR LVH, MAY BE NORMAL VARIANT ( [employer] product )  POSSIBLE LATERAL INFARCT  , AGE UNDETERMINED  ABNORMAL ECG  WHEN COMPARED WITH ECG OF 27-Jan-2023 23:52    Echo 01/28/23  Summary    1. Technically difficult study.    2. The left ventricle is normal in size with mildly increased wall  thickness.    3. The left ventricular systolic function is moderately decreased, LVEF is  visually estimated at 30-35%.    4. Mitral annular calcification is present.    5. The right ventricle is normal in size, with normal systolic function.    6. Compared to prior echocardiogram 08/2021, left ventricular systolic  function has decreased.    Electrocardiogram:    From 10/14/21 showed: 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 Left axis deviation. LBBB.      From 02/14/21 showed: SR with 1st degree AV block, left axis deviation, LBBB.      From 03/04/20 showed: sinus tach 114bpm, left axis deviation, LBBB (previously IVCD).      From 07/20/19 showed: SR, 1st degree AV block w/ occasional premature ventricular beats, left axis deviation, intraventricular conduction delay, possible anterolateral infarct.     Echocardiogram:  From 01/28/23 showed: LVEF 30-35%    TTE From 08/16/21 showed: The left ventricle is normal in size with upper normal wall thickness. The left ventricular systolic function is normal, LVEF is visually estimated at 55-60%. Mitral annular calcification is present (moderate). The right ventricle is normal in size, with normal systolic function. There is no RV thrombus.     From 04/12/21 showed: LVEF 35-40%. Mitral annular calcification is present (moderate). The aortic valve is trileaflet with mildly thickened leaflets with mildly reduced excursion. The right ventricle is normal in size, with moderately reduced systolic function. There is an echo density within the RV apex that likely represents trabeculation but thrombus or mass can not be excluded.  Recommend clinical correlation and if appropriate, complimentary diagnostic testing.      From 07/21/19 showed: LV normal in size w/ mildly increased wall thickness, LVEF 45%. Mild mitral annular calcification present. Mild aortic valve stenosis. RV normal in size, w/ normal systolic function.     Ziopatch:  From 2/21-3/07/23 showed: patient had a min HR of 50 bpm, max HR of 154 bpm, and avg HR of 80 bpm. First Degree AV Block was present. Bundle Branch Block/IVCD was present. Predominant underlying rhythm was Sinus Rhythm. 4 Supraventricular Tachycardia runs occurred, the run with the fastest interval lasting 8 beats with a max rate of 154 bpm (avg 128 bpm); the run with the fastest interval was also the longest. Isolated SVEs were rare (<1.0%), SVE Couplets were rare (<1.0%), and SVE Triplets were rare (<1.0%). Isolated VEs were rare (<1.0%), VE Couplets were rare (<1.0%), and no VE Triplets were present. Ventricular Bigeminy and Trigeminy were present.  Symptoms associated with isolated ventricular ectopic beats.      CTA chest:  From 02/14/21 showed: cardiac chambers normal in  size.    Ascending and descending aorta normal in caliber.    There is no pericardial effusion. Coronary calcification.      Cardiac Catheterization:  From 2014 showed: 30% mLAD, calcification LMCA.     Lipid panel:  Component      Latest Ref Rng 06/14/2022   Triglycerides      0 - 150 mg/dL 161 (H)    Cholesterol      <=200 mg/dL 096    HDL      40 - 60 mg/dL 37 (L)    LDL calculated      40 - 99 mg/dL 81    VLDL Cholesterol Cal      11 - 41 mg/dL 04.5    Chol/HDL Ratio      1.0 - 4.5  4.1    Non-HDL Cholesterol      70 - 409 mg/dL 811    FASTING Unknown      Labs:   Lab Results   Component Value Date    CKTOTAL 67.0 04/10/2022    TROPONINI 45 (HH) 01/28/2023    TROPONINI 67 (HH) 01/27/2023    TROPONINI 23 01/27/2023     Results for orders placed or performed during the hospital encounter of 01/27/23   Influenza/ RSV/COVID PCR    Specimen: Nasopharyngeal Swab   Result Value Ref Range    SARS-CoV-2 PCR Negative Negative    Influenza A Negative Negative    Influenza B Negative Negative    RSV Negative Negative   MRSA Screen    Specimen: Nose (nasal passage); Nares   Result Value Ref Range    MRSA PCR Not Detected Not Detected   Respiratory Pathogen Panel   Result Value Ref Range    Adenovirus Not Detected Not Detected    Coronavirus HKU1 Not Detected Not Detected    Coronavirus NL63 Not Detected Not Detected    Coronavirus 229E Not Detected Not Detected    Coronavirus OC43 PCR Not Detected Not Detected    Metapneumovirus Not Detected Not Detected    Rhinovirus/Enterovirus Not Detected Not Detected    Influenza A Not Detected Not Detected    Influenza B Not Detected Not Detected    Parainfluenza 1 Not Detected Not Detected    Parainfluenza 2 Not Detected Not Detected    Parainfluenza 3 Not Detected Not Detected    Parainfluenza 4 Not Detected Not Detected    RSV Not Detected Not Detected    Bordetella pertussis Not Detected Not Detected    Bordetella parapertussis Not Detected Not Detected    Chlamydophila (Chlamydia) pneumoniae Not Detected Not Detected    Mycoplasma pneumoniae Not Detected Not Detected    SARS-CoV-2 PCR Not Detected Not Detected   Comprehensive Metabolic Panel   Result Value Ref Range    Sodium 140 135 - 145 mmol/L    Potassium 4.4 3.4 - 4.8 mmol/L    Chloride 104 98 - 107 mmol/L    CO2 21.3 20.0 - 31.0 mmol/L    Anion Gap 15 (H) 5 - 14 mmol/L    BUN 26 (H) 9 - 23 mg/dL    Creatinine 9.14 (H) 0.55 - 1.02 mg/dL    BUN/Creatinine Ratio 22     eGFR CKD-EPI (2021) Female 46 (L) >=60 mL/min/1.37m2    Glucose 136 70 - 179 mg/dL    Calcium 78.2 8.7 - 95.6 mg/dL    Albumin 4.1 3.4 - 5.0 g/dL    Total Protein 7.3 5.7 - 8.2  g/dL    Total Bilirubin 0.4 0.3 - 1.2 mg/dL    AST 41 (H) <=13 U/L    ALT 29 10 - 49 U/L    Alkaline Phosphatase 32 (L) 46 - 116 U/L   LDH, Lactate dehydrogenase   Result Value Ref Range    LDH 222 120 - 246 U/L   PT-INR   Result Value Ref Range    PT 10.4 9.9 - 12.6 sec    INR 0.91    hsTroponin I (serial 0-2-6H w/ delta)   Result Value Ref Range    hsTroponin I 11 <=34 ng/L   hsTroponin I - 2 Hour   Result Value Ref Range    hsTroponin I 23 <=34 ng/L    delta hsTroponin I 12 (H) <=7 ng/L   Pro-BNP   Result Value Ref Range    PRO-BNP 352.0 (H) <=300.0 pg/mL   hsTroponin I - 6 Hour   Result Value Ref Range    hsTroponin I 67 (HH) <=34 ng/L    delta hsTroponin I 44 (HH) <=7 ng/L   HS Troponin 0h   Result Value Ref Range    hsTroponin I 45 (HH) <=34 ng/L   Basic metabolic panel   Result Value Ref Range    Sodium 141 135 - 145 mmol/L    Potassium 4.3 3.4 - 4.8 mmol/L    Chloride 104 98 - 107 mmol/L    CO2 19.8 (L) 20.0 - 31.0 mmol/L    Anion Gap 17 (H) 5 - 14 mmol/L    BUN 23 9 - 23 mg/dL    Creatinine 2.44 (H) 0.55 - 1.02 mg/dL    BUN/Creatinine Ratio 22     eGFR CKD-EPI (2021) Female 54 (L) >=60 mL/min/1.31m2    Glucose 203 (H) 70 - 179 mg/dL    Calcium 9.9 8.7 - 01.0 mg/dL   Magnesium Level   Result Value Ref Range    Magnesium 1.5 (L) 1.6 - 2.6 mg/dL   Phosphorus Level   Result Value Ref Range    Phosphorus 4.5 2.4 - 5.1 mg/dL   CBC   Result Value Ref Range    WBC 2.8 (L) 3.6 - 11.2 10*9/L    RBC 3.48 (L) 3.95 - 5.13 10*12/L    HGB 12.8 11.3 - 14.9 g/dL    HCT 27.2 53.6 - 64.4 %    MCV 111.8 (H) 77.6 - 95.7 fL    MCH 36.9 (H) 25.9 - 32.4 pg    MCHC 33.0 32.0 - 36.0 g/dL RDW 03.4 74.2 - 59.5 %    MPV 8.1 6.8 - 10.7 fL    Platelet 161 150 - 450 10*9/L   Lactate, Venous, Whole Blood   Result Value Ref Range    Lactate, Venous 1.8 0.5 - 1.8 mmol/L   Hepatic Function Panel   Result Value Ref Range    Albumin 3.9 3.4 - 5.0 g/dL    Total Protein 7.0 5.7 - 8.2 g/dL    Total Bilirubin 0.4 0.3 - 1.2 mg/dL    Bilirubin, Direct 6.38 0.00 - 0.30 mg/dL    AST 29 <=75 U/L    ALT 26 10 - 49 U/L    Alkaline Phosphatase 33 (L) 46 - 116 U/L   Blood Gas, Venous   Result Value Ref Range    Specimen Source Venous     FIO2 Venous Not Specified     pH, Venous 7.40 7.32 - 7.43    pCO2, Ven 35 (L)  40 - 60 mm Hg    pO2, Ven 43 (H) 35 - 40 mm Hg    HCO3, Ven 22 22 - 27 mmol/L    Base Excess, Ven -3.3 (L) -2.0 - 2.0    O2 Saturation, Venous 76.4 40.0 - 85.0 %   Pro-BNP   Result Value Ref Range    PRO-BNP 3,264.0 (H) <=300.0 pg/mL   CBC   Result Value Ref Range    WBC 3.0 (L) 3.6 - 11.2 10*9/L    RBC 3.50 (L) 3.95 - 5.13 10*12/L    HGB 13.0 11.3 - 14.9 g/dL    HCT 98.1 19.1 - 47.8 %    MCV 112.5 (H) 77.6 - 95.7 fL    MCH 37.1 (H) 25.9 - 32.4 pg    MCHC 32.9 32.0 - 36.0 g/dL    RDW 29.5 62.1 - 30.8 %    MPV 7.7 6.8 - 10.7 fL    Platelet 173 150 - 450 10*9/L   Comprehensive Metabolic Panel   Result Value Ref Range    Sodium 141 135 - 145 mmol/L    Potassium 4.9 (H) 3.4 - 4.8 mmol/L    Chloride 105 98 - 107 mmol/L    CO2 21.8 20.0 - 31.0 mmol/L    Anion Gap 14 5 - 14 mmol/L    BUN 27 (H) 9 - 23 mg/dL    Creatinine 6.57 (H) 0.55 - 1.02 mg/dL    BUN/Creatinine Ratio 22     eGFR CKD-EPI (2021) Female 44 (L) >=60 mL/min/1.52m2    Glucose 174 70 - 179 mg/dL    Calcium 9.6 8.7 - 84.6 mg/dL    Albumin 3.8 3.4 - 5.0 g/dL    Total Protein 6.8 5.7 - 8.2 g/dL    Total Bilirubin 0.6 0.3 - 1.2 mg/dL    AST 22 <=96 U/L    ALT 21 10 - 49 U/L    Alkaline Phosphatase 29 (L) 46 - 116 U/L   Magnesium Level   Result Value Ref Range    Magnesium 1.6 1.6 - 2.6 mg/dL   Basic Metabolic Panel   Result Value Ref Range    Sodium 142 135 - 145 mmol/L    Potassium 4.9 (H) 3.4 - 4.8 mmol/L    Chloride 107 98 - 107 mmol/L    CO2 20.5 20.0 - 31.0 mmol/L    Anion Gap 15 (H) 5 - 14 mmol/L    BUN 36 (H) 9 - 23 mg/dL    Creatinine 2.95 (H) 0.55 - 1.02 mg/dL    BUN/Creatinine Ratio 29     eGFR CKD-EPI (2021) Female 43 (L) >=60 mL/min/1.24m2    Glucose 181 (H) 70 - 179 mg/dL    Calcium 9.8 8.7 - 28.4 mg/dL   CBC   Result Value Ref Range    WBC 3.1 (L) 3.6 - 11.2 10*9/L    RBC 3.48 (L) 3.95 - 5.13 10*12/L    HGB 12.9 11.3 - 14.9 g/dL    HCT 13.2 44.0 - 10.2 %    MCV 112.1 (H) 77.6 - 95.7 fL    MCH 37.1 (H) 25.9 - 32.4 pg    MCHC 33.1 32.0 - 36.0 g/dL    RDW 72.5 36.6 - 44.0 %    MPV 8.5 6.8 - 10.7 fL    Platelet 168 150 - 450 10*9/L   Magnesium Level   Result Value Ref Range    Magnesium 2.0 1.6 - 2.6 mg/dL   ECG 12  Lead   Result Value Ref Range    EKG Systolic BP  mmHg    EKG Diastolic BP  mmHg    EKG Ventricular Rate 95 BPM    EKG Atrial Rate 95 BPM    EKG P-R Interval 204 ms    EKG QRS Duration 138 ms    EKG Q-T Interval 378 ms    EKG QTC Calculation 475 ms    EKG Calculated P Axis 57 degrees    EKG Calculated R Axis -62 degrees    EKG Calculated T Axis 74 degrees    QTC Fredericia 440 ms   ECG 12 Lead   Result Value Ref Range    EKG Systolic BP  mmHg    EKG Diastolic BP  mmHg    EKG Ventricular Rate 97 BPM    EKG Atrial Rate 97 BPM    EKG P-R Interval 212 ms    EKG QRS Duration 136 ms    EKG Q-T Interval 360 ms    EKG QTC Calculation 457 ms    EKG Calculated P Axis 54 degrees    EKG Calculated R Axis -67 degrees    EKG Calculated T Axis 85 degrees    QTC Fredericia 422 ms   ECG 12 Lead   Result Value Ref Range    EKG Systolic BP  mmHg    EKG Diastolic BP  mmHg    EKG Ventricular Rate 92 BPM    EKG Atrial Rate 92 BPM    EKG P-R Interval 220 ms    EKG QRS Duration 142 ms    EKG Q-T Interval 374 ms    EKG QTC Calculation 462 ms    EKG Calculated P Axis 49 degrees    EKG Calculated R Axis -63 degrees    EKG Calculated T Axis 78 degrees    QTC Fredericia 431 ms ECG 12 Lead   Result Value Ref Range    EKG Systolic BP  mmHg    EKG Diastolic BP  mmHg    EKG Ventricular Rate 111 BPM    EKG Atrial Rate 111 BPM    EKG P-R Interval 208 ms    EKG QRS Duration 136 ms    EKG Q-T Interval 314 ms    EKG QTC Calculation 427 ms    EKG Calculated P Axis 57 degrees    EKG Calculated R Axis -75 degrees    EKG Calculated T Axis 91 degrees    QTC Fredericia 385 ms   CBC w/ Differential   Result Value Ref Range    WBC 2.9 (L) 3.6 - 11.2 10*9/L    RBC 3.52 (L) 3.95 - 5.13 10*12/L    HGB 13.0 11.3 - 14.9 g/dL    HCT 16.1 09.6 - 04.5 %    MCV 112.3 (H) 77.6 - 95.7 fL    MCH 37.0 (H) 25.9 - 32.4 pg    MCHC 32.9 32.0 - 36.0 g/dL    RDW 40.9 81.1 - 91.4 %    MPV 8.5 6.8 - 10.7 fL    Platelet 148 (L) 150 - 450 10*9/L    nRBC 0 <=4 /100 WBCs    Neutrophils % 58.2 %    Lymphocytes % 29.8 %    Monocytes % 10.7 %    Eosinophils % 0.1 %    Basophils % 1.2 %    Absolute Neutrophils 1.7 (L) 1.8 - 7.8 10*9/L    Absolute Lymphocytes 0.9 (L) 1.1 - 3.6  10*9/L    Absolute Monocytes 0.3 0.3 - 0.8 10*9/L    Absolute Eosinophils 0.0 0.0 - 0.5 10*9/L    Absolute Basophils 0.0 0.0 - 0.1 10*9/L    Macrocytosis Slight (A) Not Present     Lab Results   Component Value Date    PLT 168 01/30/2023     Lab Results   Component Value Date    INR 0.91 01/27/2023     Lab Results   Component Value Date    PROBNP 3,264.0 (H) 01/28/2023     I personally spent 55 minutes face-to-face and non-face-to-face in the care of this patient, which includes all pre, intra, and post visit time on the date of service; reviewing EMR, patient physical exam, reviewing results, patient/family education, communication with primary team, coordinating outpatient follow up/services all related documentation.  All documented time was specific to the E/M visit and does not include any procedures that may have been performed.    Hassan Buckler, ACNP-AG  January 30, 2023 8:21 AM    Disclaimer: Charlestine Massed voice recognition software was used to create portions of this document.  An attempt at proofreading has been made to minimize errors.  If, however, the reader notes inconsistent information and/or needs to ask questions concerning any part of the document, please contact me for clarification.  Occasional interpretation errors may inadvertently occur due to limitations in the software.

## 2023-01-31 MED ORDER — LOSARTAN 25 MG TABLET
ORAL_TABLET | Freq: Every day | ORAL | 1 refills | 30 days | Status: CP
Start: 2023-01-31 — End: 2023-04-01

## 2023-02-01 DIAGNOSIS — G4700 Insomnia, unspecified: Principal | ICD-10-CM

## 2023-02-01 DIAGNOSIS — G250 Essential tremor: Principal | ICD-10-CM

## 2023-02-01 MED ORDER — LORAZEPAM 0.5 MG TABLET
ORAL_TABLET | 0 refills | 0.00 days
Start: 2023-02-01 — End: ?

## 2023-02-02 MED ORDER — LORAZEPAM 0.5 MG TABLET
ORAL_TABLET | 0 refills | 0 days | Status: CP
Start: 2023-02-02 — End: ?

## 2023-02-08 ENCOUNTER — Ambulatory Visit: Admit: 2023-02-08 | Discharge: 2023-02-09 | Payer: MEDICARE | Attending: Family | Primary: Family

## 2023-02-08 DIAGNOSIS — J069 Acute upper respiratory infection, unspecified: Principal | ICD-10-CM

## 2023-02-08 DIAGNOSIS — R0602 Shortness of breath: Principal | ICD-10-CM

## 2023-02-08 DIAGNOSIS — G250 Essential tremor: Principal | ICD-10-CM

## 2023-02-08 DIAGNOSIS — E875 Hyperkalemia: Principal | ICD-10-CM

## 2023-02-08 LAB — CBC
HEMATOCRIT: 40 % (ref 34.0–44.0)
HEMOGLOBIN: 13.5 g/dL (ref 11.3–14.9)
MEAN CORPUSCULAR HEMOGLOBIN CONC: 33.9 g/dL (ref 32.0–36.0)
MEAN CORPUSCULAR HEMOGLOBIN: 37.6 pg — ABNORMAL HIGH (ref 25.9–32.4)
MEAN CORPUSCULAR VOLUME: 110.9 fL — ABNORMAL HIGH (ref 77.6–95.7)
MEAN PLATELET VOLUME: 8.5 fL (ref 6.8–10.7)
PLATELET COUNT: 200 10*9/L (ref 150–450)
RED BLOOD CELL COUNT: 3.61 10*12/L — ABNORMAL LOW (ref 3.95–5.13)
RED CELL DISTRIBUTION WIDTH: 13.5 % (ref 12.2–15.2)
WBC ADJUSTED: 3.1 10*9/L — ABNORMAL LOW (ref 3.6–11.2)

## 2023-02-08 LAB — BASIC METABOLIC PANEL
ANION GAP: 11 mmol/L (ref 5–14)
BLOOD UREA NITROGEN: 33 mg/dL — ABNORMAL HIGH (ref 9–23)
BUN / CREAT RATIO: 31
CALCIUM: 10.4 mg/dL (ref 8.7–10.4)
CHLORIDE: 104 mmol/L (ref 98–107)
CO2: 25 mmol/L (ref 20.0–31.0)
CREATININE: 1.08 mg/dL — ABNORMAL HIGH (ref 0.55–1.02)
EGFR CKD-EPI (2021) FEMALE: 51 mL/min/{1.73_m2} — ABNORMAL LOW (ref >=60)
GLUCOSE RANDOM: 142 mg/dL (ref 70–179)
POTASSIUM: 5.3 mmol/L — ABNORMAL HIGH (ref 3.4–4.8)
SODIUM: 140 mmol/L (ref 135–145)

## 2023-02-08 NOTE — Unmapped (Signed)
 Cindy Gonzalez is a 83 y.o. female who presents for Transition Care Management visit.   Patient is accompanied by son.    Assessment/Plan:         [facility]indy Gonzalez was seen today for hospitalization follow-up.    Diagnoses and all orders for this visit:    Hyperkalemia  -     Basic Metabolic Panel  -     CBC    Upper respiratory tract infection, unspecified type  -     CBC    SOB (shortness of breath)  -     CBC    Essential tremor  -     Basic Metabolic Panel  -     CBC        Return for follow up with PCP in one week .    Medical decision making was of moderate [phone]- must be seen within 14 days) complexity.    Durable medical equipment ordered: wheelchair, oxygen    Barriers to recommended plan: None identified    Community resources identified for patient/family: None  [ ]  consider repeat CT chest in 6-8 wks to ensure resolution of GGO  [ ]  stop meloxicam given CKD    Patient not aware of CKD, takes one daily. Resistent to stopping.   Defer labs     Does not feel as strong as she should. Asking about do I need potassium.     Low sodium diet. Gave up pickle juice.   Watching water intake. Due to edema in legs.   Checking weights to dose furosemide.   169.8 unclothed     [ ]  start SSRI/SNRI?       02/15/23 PCP  03/05/23 cardiology     Taking lorazepam 1/2 in AM and 1/2 in the evening if heart racing.   Taking metoprolol   Checking BP twice    BP med 11 am   Takes twice per day.    130/60-80      Prefers to be called about lab results.       Left ear pain    2-3 murmur     Costoch  Left localized  When coughs or sneezes.     Subjective:            Post discharge interactive communication via telephone was made with patient on 02/05/23 and I have reviewed the information from that communication.    HPI  [facility] to 01/30/23 due to SOB, Acute decompensated HFrecEF. Discharge Summary reviewed. Patient was able to get all new medications after being discharged: yes. New symptoms since discharge:yes. Patient has needed Home Health since discharge: no.     CHF  Hurting in left chest and radiating to left upper back.       Hosp RUQ pneumonia secondary to URI   Using oxygen if O2 below 92-90% 2L, does not use consistently.     See cardiology Dr. Julio Gonzalez in less than 1 month.       ROS  Constitutional:  Denies  unexpected weight loss or gain, or weakness   Eyes:  Denies visual changes  Respiratory:  Denies cough or shortness of breath. No change in exercise  tolerance  Cardiovascular:  Denies chest pain, palpitations or lower extremity swelling   GI:  Denies abdominal pain, diarrhea, constipation   Musculoskeletal:  Denies myalgias  Skin:  Denies nonhealing lesions  Neurologic:  Denies headache, focal weakness or numbness,  tingling  Endocrine:  Denies polyuria or polydypsia   Psychiatric:  Denies depression, anxiety    Outpatient Medications Prior to Visit   Medication Sig Dispense Refill    albuterol HFA 90 mcg/actuation inhaler Inhale 2 puffs every six (6) hours as needed for wheezing. 18 g 2    aspirin (ECOTRIN) 81 MG tablet Take 1 tablet (81 mg total) by mouth daily.      atorvastatin (LIPITOR) 10 MG tablet Take 1 tablet (10 mg total) by mouth daily. 90 tablet 2    blood sugar diagnostic (ACCU-CHEK GUIDE TEST STRIPS) Strp by Other route Two (2) times a day (30 minutes before a meal). 200 each 1    blood-glucose meter kit Use as instructed      cholecalciferol, vitamin D3, (VITAMIN D3 ORAL) Take by mouth. Takes one 3 times a week      fenofibrate (TRICOR) 145 MG tablet Take 1 tablet (145 mg total) by mouth daily.      furosemide (LASIX) 20 MG tablet Take 1 tablet (20 mg total) by mouth 3 (three) times a week. 36 tablet 1    hydroxyurea (HYDREA) 500 mg capsule Take 1 capsule (500mg ) by mouth TWICE daily Monday-Thursday, AND 1 capsule (500mg ) ONCE daily on Friday-Sunday. 150 capsule 3    ipratropium (ATROVENT) 42 mcg (0.06 %) nasal spray 2 sprays into each nostril four (4) times a day.      lancets (ACCU-CHEK SOFTCLIX LANCETS) Misc USE AS DIRECTED TO  CHECK  GLUCOSE  TWICE  DAILY 200 each 2    lancing device Misc USE TO CHECK BLOOD SUGAR 2 TIMES A DAY      LORazepam (ATIVAN) 0.5 MG tablet TAKE 1/2 (ONE-HALF) TABLET BY MOUTH TWICE DAILY AS NEEDED FOR ANXIETY 30 tablet 0    losartan (COZAAR) 25 MG tablet Take 1 tablet (25 mg total) by mouth daily. 30 tablet 1    meloxicam (MOBIC) 7.5 MG tablet Take 1 tablet (7.5 mg total) by mouth daily. 90 tablet 1    metFORMIN (GLUCOPHAGE) 500 MG tablet TAKE 1 TABLET BY MOUTH IN THE MORNING AND 1/2 (ONE-HALF) ONCE DAILY WITH SUPPER 135 tablet 1    metoPROLOL succinate (TOPROL-XL) 50 MG 24 hr tablet TAKE 1 TABLET BY MOUTH AT BEDTIME (Patient taking differently: Take 1 tablet (50 mg total) by mouth daily. Takes 25 mg twice daily) 90 tablet 3    neomycin-polymyxin-dexAMETHasone (MAXITROL) 3.5mg /mL-10,000 unit/mL-0.1 % ophthalmic suspension INSTILL 1 DROP INTO RIGHT EYE THREE TIMES DAILY SHAKE BOTTLE WELL BEFORE EACH USE.      RESTASIS 0.05 % ophthalmic emulsion INSTILL 1 DROP INTO EACH EYE TWICE DAILY      spironolactone (ALDACTONE) 25 MG tablet Take 1 tablet (25 mg total) by mouth daily. 90 tablet 1    benzonatate (TESSALON) 100 MG capsule Take 2 capsules (200 mg total) by mouth Three (3) times a day as needed for cough. (Patient not taking: Reported on 02/08/2023)      calcium-vitamin D 500 mg(1,250mg ) -200 unit per tablet Take 1 tablet by mouth in the morning and 1 tablet in the evening. Take with meals.      cyanocobalamin 2000 MCG tablet Take 1 tablet (2,000 mcg total) by mouth daily. (Patient not taking: Reported on 02/08/2023)      estradiol (ESTRACE) 0.01 % (0.1 mg/gram) vaginal cream Insert 2 g into the vagina daily. Use externally prn (Patient not taking: Reported on 02/08/2023) 42.5 g 11    fluticasone propionate (FLONASE) 50 mcg/actuation nasal  spray Use 1 spray(s) in each nostril once daily (Patient not taking: Reported on 02/08/2023) 16 g 0    hydrOXYzine (ATARAX) 10 MG tablet Take 1 tablet (10 mg total) by mouth daily as needed for itching. Uses  only for bug bites      ketoconazole (NIZORAL) 2 % cream APPLY  CREAM TOPICALLY ONCE DAILY FOR SCALP RASH 30 g 0    nystatin (MYCOSTATIN) 100,000 unit/gram cream Apply topically two (2) times a day. (Patient not taking: Reported on 02/08/2023) 30 g 2    traMADol (ULTRAM) 50 mg tablet Take 0.5 tablets (25 mg total) by mouth daily as needed for pain. (Patient not taking: Reported on 02/08/2023)      triamcinolone (KENALOG) 0.1 % cream Apply 1 Application topically Two (2) times a day (at 8am and 12:00). (Patient not taking: Reported on 02/08/2023)       No facility-administered medications prior to visit.       Medications prescribed or ordered upon discharge were reviewed on 02/08/23 and reconciled with the most recent outpatient medication list. I have reviewed and agree with this medication reconciliation.       The following portions of the patient's history were reviewed and updated as appropriate: allergies, current medications, past medical history, past social history, and problem list.          Objective:            Vital Signs  BP 138/80 (BP Position: Sitting)  - Pulse 93  - Temp 36.6 ??C (97.8 ??F) (Oral)  - Ht 157.5 cm (5' 2)  - Wt 76.7 kg (169 lb) Comment: at home - LMP  (LMP Unknown)  - SpO2 96%  - BMI 30.91 kg/m??      Exam  General: Well developed. Well nourished. No marked weight fluctuation.  HEENT:  Normocephalic.  Atraumatic.     Neck:  No thyroid enlargement, carotid bruits  Heart:  Regular rate and rhythm . No murmurs or gallops  Lungs:  No respiratory distress.  Lungs clear to auscultation.  Extremities:  No edema. Peripheral pulses normal  Skin:  Warm, dry, no nonhealing lesions  Neuro:  Non-focal. No obvious weakness.   Psych:  Affect normal, eye contact good, speech clear and coherent.        Noralyn Pick, FNP

## 2023-02-15 ENCOUNTER — Ambulatory Visit
Admit: 2023-02-15 | Discharge: 2023-02-16 | Payer: MEDICARE | Attending: Student in an Organized Health Care Education/Training Program | Primary: Student in an Organized Health Care Education/Training Program

## 2023-02-15 DIAGNOSIS — Z1231 Encounter for screening mammogram for malignant neoplasm of breast: Principal | ICD-10-CM

## 2023-02-15 DIAGNOSIS — E875 Hyperkalemia: Principal | ICD-10-CM

## 2023-02-15 DIAGNOSIS — I10 Essential (primary) hypertension: Principal | ICD-10-CM

## 2023-02-15 DIAGNOSIS — I5022 Chronic systolic (congestive) heart failure: Principal | ICD-10-CM

## 2023-02-15 DIAGNOSIS — M1990 Unspecified osteoarthritis, unspecified site: Principal | ICD-10-CM

## 2023-02-15 DIAGNOSIS — Z8572 Personal history of non-Hodgkin lymphomas: Principal | ICD-10-CM

## 2023-02-15 DIAGNOSIS — Z1239 Encounter for other screening for malignant neoplasm of breast: Principal | ICD-10-CM

## 2023-02-15 DIAGNOSIS — E118 Type 2 diabetes mellitus with unspecified complications: Principal | ICD-10-CM

## 2023-02-15 DIAGNOSIS — E1165 Type 2 diabetes mellitus with hyperglycemia: Principal | ICD-10-CM

## 2023-02-15 LAB — BASIC METABOLIC PANEL
ANION GAP: 10 mmol/L (ref 5–14)
BLOOD UREA NITROGEN: 34 mg/dL — ABNORMAL HIGH (ref 9–23)
BUN / CREAT RATIO: 32
CALCIUM: 10.4 mg/dL (ref 8.7–10.4)
CHLORIDE: 105 mmol/L (ref 98–107)
CO2: 27 mmol/L (ref 20.0–31.0)
CREATININE: 1.05 mg/dL — ABNORMAL HIGH (ref 0.55–1.02)
EGFR CKD-EPI (2021) FEMALE: 53 mL/min/{1.73_m2} — ABNORMAL LOW (ref >=60)
GLUCOSE RANDOM: 178 mg/dL — ABNORMAL HIGH (ref 70–99)
POTASSIUM: 5.2 mmol/L — ABNORMAL HIGH (ref 3.4–4.8)
SODIUM: 142 mmol/L (ref 135–145)

## 2023-02-15 LAB — HEMOGLOBIN A1C
ESTIMATED AVERAGE GLUCOSE: 160 mg/dL
HEMOGLOBIN A1C: 7.2 % — ABNORMAL HIGH (ref 4.8–5.6)

## 2023-02-15 MED ORDER — SPIRONOLACTONE 25 MG TABLET
ORAL_TABLET | Freq: Every day | ORAL | 1 refills | 90.00 days | Status: CP
Start: 2023-02-15 — End: 2024-02-15

## 2023-02-15 MED ORDER — ESTRADIOL 0.01% (0.1 MG/GRAM) VAGINAL CREAM
Freq: Every day | VAGINAL | 11 refills | 21.00 days | Status: CP
Start: 2023-02-15 — End: ?

## 2023-02-15 MED ORDER — METFORMIN 500 MG TABLET
ORAL_TABLET | 1 refills | 0.00 days | Status: CP
Start: 2023-02-15 — End: 2024-02-15

## 2023-02-15 NOTE — Unmapped (Signed)
 Assessment and Plan:     83 yo female presents for chronic care follow up   -I last saw her on 10/15/2022  -She had hospitalization from 01/27/23 to 01/30/2023    (1) Hypertension, Non-ischemic cardiomypathy, CAD, HLD  BP Readings from Last 3 Encounters:   02/15/23 119/69   02/08/23 138/80   01/30/23 106/55   -Medications were changed during her recent hospitalization in January 2025   -She is now taking Furosemide 20mg  THREE days a week instead of twice a week   -Lisinopril was switched to Losartan 25mg  every day   -She is still taking: Spirolactone 25mg  every day, Metoprolol succinate 25mg  BID  -Per discharge summary, her dry weight is around 170lbs  -Cardiology visit on 03/05/2023  -Refill provided on spirolactone  -     spironolactone; Take 1 tablet (25 mg total) by mouth daily.    (2) Extranodal Marginal Zone B-Cell Lymphoma, Thrombocytopenia, erythrocytosis  -Noted on previous PCP visit on 06/14/2022: she was seen by [facility] heme onc in  02/2022 and encounter details reviewed with the patient today. CBC done at that time with H/H of 18/57 with normal white blood cell count, platelet count and MCV.  Iron and ferritin level were low normal at that time. She was advised to stop iron supplement . She is on Vit B 12 supplement. She was started on Hydrea 500 mg M, W, F and 1000 mg on all other days.  She is had her first therapeutic phlebotomy in 03/2022.   -Following with Hematology/Oncology, last visit 12/19/2022    Ms Bougher is an 83 y.o. w/ a h/o erythrocytosis.  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  A bone marrow biopsy is not needed to make the diagnosis though it may offer additional prognostic information.  She also has an IDH2 and TET2 mutation, conveying worse prognosis.     Ms. Merkle is doing well.  She continues to tolerate her current dose of HU and her counts look very good. She is complaining of an overall feeling of malaise; will check urine today for UTI as they can present differently in seniors even though asymptomatic.  This was negative; will have patient f/u with PCP.  PLAN:  1) Return to clinic in three months.   2) Continue on Hydrea 500 mg.   1 tablet BID on Mon - Thur  1 tablet every day on Fri - Sun  3) Continue low dose aspirin  4) UA today      (3) Type 2 DM  -Previous A1C was 7.4% (06/14/22) to 6.7% in 10/2022  -She is currently taking Metformin 500mg  in the AM and 250mg  in the PM  -Given her age, A1C goal 7.0-8.0%  -Rechecking her A1C  -Encouraged to have diabetic eye exam performed  -Diabetic eye exam and foot exam due 10/2023  -     Hemoglobin A1c  -Refill provided on metformin   -     metFORMIN; TAKE 1 TABLET BY MOUTH IN THE MORNING AND 1/2 (ONE-HALF) ONCE DAILY WITH SUPPER  -We will need to consider changing her medications from metformin to a GLP-1 agonist given her renal function (if it continues to decline) and the cardiac benefits    (4)Vertigo, Essential Tremor, Insomnia  -She is not having vertigo symptoms at this time. However the essential tremor is present on exam. Previously tried Propranolol but caused sedation and stopped.   -  She is taking Lorazepam for Insomnia  -PDMP reviewed and is approrpiate    (5) Hyperkalemia, CKD3A  -Noted to have worsening renal function during hospitalization. These results were discussed.    -Recent BMP (02/08/23) also revealed hyperkalemia  -Rechecking BMP  -     Basic Metabolic Panel  -We discussed in depth that she MUST stop the Mobic as this can be contributing to her worsening renal function and hyperkalemia  -discussed pending labs, may need to refer to Nephrology as well    (6) Skin carcinoma   -Previously diagnosed with BCC. 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   -Previously was taking Tramadol 25mg  PRN for severe pain (however does not like to take due to ADE)  -We discussed that given her hyperkalemia and renal function, she needs to stop taking Mobix.   -I offered other medications for pain but she declined at this time.   -I offered another referral to PT as she had positive benefit previously from PT; however she has declined   -I would recommend to continue Tramadol 25mg  PRN for severe pain, to stop mobic and to not take Lorazepam when she is taking Tramadol which has been addressed previously as well)         HEALTH MAINTENANCE  -Asthma: Prescribed Albuterol PRN  -Hx of Constipation: Continue OTC---Metamucil, Miralax, Citrucel  -Atrophic Vaginitis: To use Topical Estrace Vaginal cream. Rx provided  -     estradioL; Insert 2 g into the vagina daily. Use externally prn  -Mammogram Due 03/2023. Order placed   -     Mammo Digital Screening Bilateral; Future   -DEXA on 03/13/2022: Osteopenia. Repeat in 2026        Chronic care follow up in 4 months    I personally spent 40 minutes face-to-face and non-face-to-face in the care of this patient, which includes all pre, intra, and post visit time on the date of service.  All documented time was specific to the E/M visit and does not include any procedures that may have been performed.        Subjective:     Cindy Gonzalez 82 y.o.female   is being seen for chronic care follow up   Chief Complaint   Patient presents with    Follow-up     Need refill on estradiol vaginal cream ,Nizoral,metformin, and sprinolactone          No LMP recorded (lmp unknown). Patient has had a hysterectomy.    -she notes that they took her off of lisinopril and put her on cozaar  She was told to watch her sodium   She had been taking a fluid pill more frequently now, now 3 times a week     She notes she does have a little bit of racing of her heart off/on, not getting worse however   She is not having chest pain   -she is urinating well   -she does still mobic    ROS:     ROS negative unless otherwise noted in HPI      Vital Signs:     Wt Readings from Last 3 Encounters:   02/15/23 79.7 kg (175 lb 9.6 oz)   02/08/23 76.7 kg (169 lb)   01/30/23 78.1 kg (172 lb 1.6 oz)     Temp Readings from Last 3 Encounters:  02/15/23 36.4 ??C (97.6 ??F)   02/08/23 36.6 ??C (97.8 ??F) (Oral)   01/30/23 36.2 ??C (97.1 ??F) (Temporal)     BP Readings from Last 3 Encounters:   02/15/23 119/69   02/08/23 138/80   01/30/23 106/55     Pulse Readings from Last 3 Encounters:   02/15/23 79   02/08/23 93   01/30/23 82       Objective:     General Appearance: Alert, cooperative, no distress  Head and Neck: Normocephalic, atraumatic.   EENT: EOMI. Conjuntiva clear  Cardiovascular:  Regular rate and rhythm. Murmur is present  Respiratory: Her lungs today appear clear to auscultation bilaterally,  Respiratory effort unremarkable. No wheezes or rhonchi.   Musculoskeletal: Gait is stable with use of a cane.  Extremities :Trace edema present  Neurologic: Alert and oriented x3. CN II-XII grossly intact. Head tremor present  Psychiatric: Mood and affect appropriate for situation.

## 2023-02-18 DIAGNOSIS — N1831 Stage 3a chronic kidney disease (CMS-HCC): Principal | ICD-10-CM

## 2023-02-18 DIAGNOSIS — E875 Hyperkalemia: Principal | ICD-10-CM

## 2023-02-18 NOTE — Unmapped (Signed)
-----   Message from [facility], DO sent at 02/18/2023 11:16 AM EST -----  Please contact this patient with the following  -Your A1C did go from 6.7% to 7.2%. However I would NOT make any medication changes at this timme  -YOur potassium did go down slightly from before.   -Your kidney function is still down. As we discussed at your appointment, it is important to STOP taking mobic.   -GIven your kidney function and electrolytes, I am going to refer you to nephrology.     THank you

## 2023-02-18 NOTE — Unmapped (Signed)
 Went over lab results per Dr Rosine Beat informed patient about nephrology referral and that they will call her to schedule an appointment.    Nigel Berthold, CMA

## 2023-02-21 MED ORDER — ACCU-CHEK GUIDE TEST STRIPS
Freq: Two times a day (BID) | 3 refills | 0.00 days | Status: CP
Start: 2023-02-21 — End: 2024-02-21

## 2023-03-01 NOTE — Unmapped (Signed)
 Assessment/Plan   1. Coronary artery disease involving native coronary artery of native heart without angina pectoris (Primary)  Stable nonobstructive disease, most recent LDL was 81 but she is not consistently taking her statin. CTA in ED 01/27/2023 showed moderate coronary calcifications. Patient continues on aspirin without problem.     2. 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 Patient remains compliant on furosemide. I ordered for an evaluation of her potassium levels today. No changes to treatment plan at this time.      3. Primary hypertension  Blood pressure was not well-controlled today in clinic and her home blood pressure readings also not consistent.  I have asked her to record her blood pressures at home on a consistent basis and bring those to me next Monday so we can potentially increase her losartan dose. Patient stopped amlodipine herself and was switched from lisinopril to losartan by charge for unclear reasons since last visit. I instructed her that she shouldn't change her prescribed medication on her own. Currently she is on amlodipine 10 mg daily, losartan 25 mg daily, metoprolol succinate 50 mg nightly.    Return in about 3 months (around 06/05/2023).    Subjective:   GNF:AOZHYQ, [name], DO  Chief complaint:  83 y.o. female with a past medical history of HFpEF, HTN, mild LV dysfunction, presenting for post ED cardiac follow up.     Per chart review, the patient was stable at last visit with me with elevated BP. Since then, the patient presented to ED 01/27/2023 with exacerbation of CHF and Upper respiratory tract infection. Symptomatic with DOE, palpitations, and mild LE edema. Echo in ED showed newly reduced LVEF at 30-35%. Diuresed with furosemide in ED and discharged 01/30/2023 on home GDMT. She was also switched from lisinopril to losartan in ED. CTA in ED showed moderate coronary calcifications. Patient made the decision herself to stop her amlodipine.     At last visit: flu shot given.       History of Present Illness:  Today ANNALAURA SAUSEDA presents to the clinic accompanied and ambulating with a walker for external support. Patient presents reporting her BP checks at home only included very limited recordings. I advised the patient that she shouldn't change her medication on her own and she should start taking her BP at home more comprehensively and consistently. I instructed the patient to report her BP to me next Monday that we can potentially increase her losartan dose. I assured her it is ok to not be taking losartan before her BP log comes back. I also told the patient based on her condition she can't take spironolactone. For HF, patient remains compliant on furosemide. I instructed the patient to stop eating bananas, salt, and salt substitutes.     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)    Hypoxemia    Essential tremor    Hyponatremia    Acute disorder of liver    NICM (nonischemic cardiomyopathy) (CMS-HCC)    Extranodal marginal zone B-cell lymphoma    Leukopenia    Diabetes mellitus (CMS-HCC)    Dyslipidemia    History of non-Hodgkin's lymphoma    Arthritis  Seborrheic dermatitis    Insomnia    Obesity    Osteoarthritis    Mild aortic stenosis    Dryness of vagina    Skin carcinoma    Vertigo    History of pneumonia    History of constipation    Erythrocytosis    Iron deficiency anemia    Iron deficiency    Thrombocytopenia, unspecified (CMS-HCC)    BMI 34.0-34.9,adult    History of vertigo    Polycythemia vera (CMS-HCC)    Shortness of breath    Tachycardia    URI (upper respiratory infection)    Mild intermittent asthma without complication    Anxiety disorder    Long term current use of aspirin    On home oxygen therapy       Medications:  Current Outpatient Medications   Medication Sig Dispense Refill    albuterol HFA 90 mcg/actuation inhaler Inhale 2 puffs every six (6) hours as needed for wheezing. 18 g 2    aspirin (ECOTRIN) 81 MG tablet Take 1 tablet (81 mg total) by mouth daily.      atorvastatin (LIPITOR) 10 MG tablet Take 1 tablet (10 mg total) by mouth daily. 90 tablet 2    calcium-vitamin D 500 mg(1,250mg ) -200 unit per tablet Take 1 tablet by mouth in the morning and 1 tablet in the evening. Take with meals.      cholecalciferol, vitamin D3, (VITAMIN D3 ORAL) Take by mouth. Takes one 3 times a week      erythromycin (ROMYCIN) 5 mg/gram (0.5 %) ophthalmic ointment APPLY A SMALL AMOUNT INTO RIGHT EYE 3 TIMES A DAY      estradiol (ESTRACE) 0.01 % (0.1 mg/gram) vaginal cream Insert 2 g into the vagina daily. Use externally prn 42.5 g 11    fenofibrate (TRICOR) 145 MG tablet Take 1 tablet (145 mg total) by mouth daily.      fluticasone propionate (FLONASE) 50 mcg/actuation nasal spray Use 1 spray(s) in each nostril once daily 16 g 0    furosemide (LASIX) 20 MG tablet Take 1 tablet (20 mg total) by mouth 3 (three) times a week. 36 tablet 1    hydroxyurea (HYDREA) 500 mg capsule Take 1 capsule (500mg ) by mouth TWICE daily Monday-Thursday, AND 1 capsule (500mg ) ONCE daily on Friday-Sunday. 150 capsule 3    ketoconazole (NIZORAL) 2 % cream APPLY  CREAM TOPICALLY ONCE DAILY FOR SCALP RASH 30 g 0    LORazepam (ATIVAN) 0.5 MG tablet TAKE 1/2 (ONE-HALF) TABLET BY MOUTH TWICE DAILY AS NEEDED FOR ANXIETY 30 tablet 0    losartan (COZAAR) 25 MG tablet Take 1 tablet (25 mg total) by mouth daily. 30 tablet 1    metFORMIN (GLUCOPHAGE) 500 MG tablet TAKE 1 TABLET BY MOUTH IN THE MORNING AND 1/2 (ONE-HALF) ONCE DAILY WITH SUPPER 135 tablet 1    metoPROLOL succinate (TOPROL-XL) 50 MG 24 hr tablet TAKE 1 TABLET BY MOUTH AT BEDTIME (Patient taking differently: Take 1 tablet (50 mg total) by mouth daily. Takes 25 mg once a day) 90 tablet 3    nystatin (MYCOSTATIN) 100,000 unit/gram cream Apply topically two (2) times a day. 30 g 2    RESTASIS 0.05 % ophthalmic emulsion INSTILL 1 DROP INTO EACH EYE TWICE DAILY      spironolactone (ALDACTONE) 25 MG tablet Take 1 tablet (25 mg total) by mouth daily. 90 tablet 1    blood sugar diagnostic (ACCU-CHEK GUIDE TEST STRIPS) Strp by Other route Two (2) times  a day (30 minutes before a meal). 200 each 3    blood-glucose meter kit Use as instructed      cyanocobalamin 2000 MCG tablet Take 1 tablet (2,000 mcg total) by mouth daily. (Patient not taking: Reported on 03/05/2023)      hydrOXYzine (ATARAX) 10 MG tablet Take 1 tablet (10 mg total) by mouth daily as needed for itching. Uses  only for bug bites (Patient not taking: Reported on 03/05/2023)      ipratropium (ATROVENT) 42 mcg (0.06 %) nasal spray 2 sprays into each nostril four (4) times a day. (Patient not taking: Reported on 03/05/2023)      lancets (ACCU-CHEK SOFTCLIX LANCETS) Misc USE AS DIRECTED TO  CHECK  GLUCOSE  TWICE  DAILY 200 each 2    lancing device Misc USE TO CHECK BLOOD SUGAR 2 TIMES A DAY       No current facility-administered medications for this visit.       Allergies  Allergies   Allergen Reactions    Amoxicillin      Other Reaction(s): Unknown    Doxycycline Other (See Comments)     Makes her nervous and shaky.    Cephalexin Palpitations    Ciprofloxacin Palpitations     Legacy System: Abran Cantor    Onset Date: <blank>    Substance Legacy/Cerner: Cipro / Cipro (Legacy value)    Category: Drug    Severity Legacy/Cerner: <blank> / Unknown    Reaction(s): makes head feel funny  nausea    Comments: <blank>    Hydrocodone-Acetaminophen Nausea And Vomiting and Other (See Comments)    Sulfamethoxazole Palpitations       Social History:   Social History     Tobacco Use    Smoking status: Never    Smokeless tobacco: Never   Vaping Use    Vaping status: Never Used   Substance Use Topics    Alcohol use: Not Currently    Drug use: Never       Family History:  Family History   Problem Relation Age of Onset    Cancer Other     Diabetes Mother     Asthma Mother     Hypertension Father     Stroke Father        ROS- 12 system review is negative other than what is specified in the History of Present Illness.      Objective:   Physical Exam  Vitals:    03/05/23 1556   BP: 137/65   Pulse: 83   Resp: 20   Temp: 36.8 ??C (98.2 ??F)   SpO2: 95%   Weight: 78.9 kg (174 lb)   Height: 157.5 cm (5' 2)     General-  Normal appearing female in no apparent distress.  Neurologic- Alert and oriented X3.  Cranial nerve II-XII grossly intact.  HEENT-  Normocephalic atraumatic head.  No scleral icterus.  Wearing face mask.  Neck- Supple, no carotid bruis, flat neck veins  Lungs- Clear to auscultation, no wheezes, rhonchi, or rhales.  Heart-  RRR, 1/6 systolic ejection murmur  Abdomen- Soft, nontender, no organomegally.  Extremities-  No clubbing or cyanosis.  No pitting edema to lower extremities bilaterally  Pulses- 2+ pulses in radial and dorsalis pedis bilaterally.  Psych- Normal mood, appropriate.      Laboratory data:    I have personally reviewed the images of the following diagnostic studies.      Electrocardiogram:  From 01/28/2023 showed: Sinus tachy. Left axis deviation.  Intraventricular conduction delay.      Echocardiogram:  From ED 01/28/2023 showed: The left ventricle is normal in size with mildly increased wall thickness.The left ventricular systolic function is moderately decreased, LVEF is visually estimated at 30-35%. 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 The right ventricle is normal in size, with normal systolic function. Compared to prior echocardiogram 08/2021, left ventricular systolic function has decreased, aortic stenosis is now present.    TTE From 08/16/21 showed: The left ventricle is normal in size with upper normal wall thickness. The left ventricular systolic function is normal, LVEF is visually estimated at 55-60%. Mitral annular calcification is present (moderate). The right ventricle is normal in size, with normal systolic function. There is no RV thrombus.     Ziopatch:  From 2/21-3/07/23 showed: patient had a min HR of 50 bpm, max HR of 154 bpm, and avg HR of 80 bpm. First Degree AV Block was present. Bundle Branch Block/IVCD was present. Predominant underlying rhythm was Sinus Rhythm. 4 Supraventricular Tachycardia runs occurred, the run with the fastest interval lasting 8 beats with a max rate of 154 bpm (avg 128 bpm); the run with the fastest interval was also the longest. Isolated SVEs were rare (<1.0%), SVE Couplets were rare (<1.0%), and SVE Triplets were rare (<1.0%). Isolated VEs were rare (<1.0%), VE Couplets were rare (<1.0%), and no VE Triplets were present. Ventricular Bigeminy and Trigeminy were present.  Symptoms associated with isolated ventricular ectopic beats.      CTA chest:  From 01/27/2023 showed: Cardiac chambers are normal in size. Moderate coronary artery calcifications. No pericardial effusion. Normal caliber aorta with moderate calcifications.      Cardiac Catheterization:  From 2014 showed: 30% mLAD, calcification LMCA.     Lipid panel:  Component      Latest Ref Rng 06/14/2022   Triglycerides      0 - 150 mg/dL 914 (H)    Cholesterol      <=200 mg/dL 782    HDL      40 - 60 mg/dL 37 (L)    LDL calculated      40 - 99 mg/dL 81    VLDL Cholesterol Cal      11 - 41 mg/dL 95.6    Chol/HDL Ratio      1.0 - 4.5  4.1    Non-HDL Cholesterol      70 - 213 mg/dL 086    FASTING Unknown      Documentation assistance was provided by Jana Hakim, Scribe on March 05, 2023 for Joneen Roach, MD.

## 2023-03-05 ENCOUNTER — Ambulatory Visit: Admit: 2023-03-05 | Discharge: 2023-03-06 | Payer: MEDICARE

## 2023-03-05 LAB — CBC W/ AUTO DIFF
BASOPHILS ABSOLUTE COUNT: 0 10*9/L (ref 0.0–0.1)
BASOPHILS RELATIVE PERCENT: 0.7 %
EOSINOPHILS ABSOLUTE COUNT: 0 10*9/L (ref 0.0–0.5)
EOSINOPHILS RELATIVE PERCENT: 0.1 %
HEMATOCRIT: 39.1 % (ref 34.0–44.0)
HEMOGLOBIN: 13.1 g/dL (ref 11.3–14.9)
LYMPHOCYTES ABSOLUTE COUNT: 1.2 10*9/L (ref 1.1–3.6)
LYMPHOCYTES RELATIVE PERCENT: 35 %
MEAN CORPUSCULAR HEMOGLOBIN CONC: 33.4 g/dL (ref 32.0–36.0)
MEAN CORPUSCULAR HEMOGLOBIN: 37.3 pg — ABNORMAL HIGH (ref 25.9–32.4)
MEAN CORPUSCULAR VOLUME: 111.8 fL — ABNORMAL HIGH (ref 77.6–95.7)
MEAN PLATELET VOLUME: 8.2 fL (ref 6.8–10.7)
MONOCYTES ABSOLUTE COUNT: 0.4 10*9/L (ref 0.3–0.8)
MONOCYTES RELATIVE PERCENT: 10.3 %
NEUTROPHILS ABSOLUTE COUNT: 1.9 10*9/L (ref 1.8–7.8)
NEUTROPHILS RELATIVE PERCENT: 53.9 %
NUCLEATED RED BLOOD CELLS: 0 /100{WBCs} (ref <=4)
PLATELET COUNT: 201 10*9/L (ref 150–450)
RED BLOOD CELL COUNT: 3.5 10*12/L — ABNORMAL LOW (ref 3.95–5.13)
RED CELL DISTRIBUTION WIDTH: 13.4 % (ref 12.2–15.2)
WBC ADJUSTED: 3.5 10*9/L — ABNORMAL LOW (ref 3.6–11.2)

## 2023-03-05 LAB — COMPREHENSIVE METABOLIC PANEL
ALBUMIN: 4.2 g/dL (ref 3.4–5.0)
ALKALINE PHOSPHATASE: 35 U/L — ABNORMAL LOW (ref 46–116)
ALT (SGPT): 20 U/L (ref 10–49)
ANION GAP: 14 mmol/L (ref 5–14)
AST (SGOT): 24 U/L (ref <=34)
BILIRUBIN TOTAL: 0.3 mg/dL (ref 0.3–1.2)
BLOOD UREA NITROGEN: 34 mg/dL — ABNORMAL HIGH (ref 9–23)
BUN / CREAT RATIO: 28
CALCIUM: 10.3 mg/dL (ref 8.7–10.4)
CHLORIDE: 106 mmol/L (ref 98–107)
CO2: 25.1 mmol/L (ref 20.0–31.0)
CREATININE: 1.2 mg/dL — ABNORMAL HIGH (ref 0.55–1.02)
EGFR CKD-EPI (2021) FEMALE: 45 mL/min/{1.73_m2} — ABNORMAL LOW (ref >=60)
GLUCOSE RANDOM: 142 mg/dL (ref 70–179)
POTASSIUM: 4.9 mmol/L — ABNORMAL HIGH (ref 3.4–4.8)
PROTEIN TOTAL: 7.7 g/dL (ref 5.7–8.2)
SODIUM: 145 mmol/L (ref 135–145)

## 2023-03-05 LAB — LACTATE DEHYDROGENASE: LACTATE DEHYDROGENASE: 200 U/L (ref 120–246)

## 2023-03-05 NOTE — Unmapped (Signed)
 She was hospitalized for pneumonia. She quit taking her Amlodipine 10 mg a few months ago because she did not think she needed it.  She was started on Losartan while in the hospital and they stopped her Lisinopril.  She takes her Metoprolol 1/2 tab at night (25 mg) and if her heart rate is elevated in the AM, she takes the other half.

## 2023-03-12 DIAGNOSIS — D45 Polycythemia vera: Principal | ICD-10-CM

## 2023-03-13 ENCOUNTER — Ambulatory Visit
Admit: 2023-03-13 | Discharge: 2023-03-14 | Payer: MEDICARE | Attending: Hematology & Oncology | Primary: Hematology & Oncology

## 2023-03-13 ENCOUNTER — Other Ambulatory Visit: Admit: 2023-03-13 | Discharge: 2023-03-14 | Payer: MEDICARE

## 2023-03-13 DIAGNOSIS — D751 Secondary polycythemia: Principal | ICD-10-CM

## 2023-03-13 DIAGNOSIS — D45 Polycythemia vera: Principal | ICD-10-CM

## 2023-03-13 LAB — COMPREHENSIVE METABOLIC PANEL
ALBUMIN: 4.3 g/dL (ref 3.4–5.0)
ALKALINE PHOSPHATASE: 32 U/L — ABNORMAL LOW (ref 46–116)
ALT (SGPT): 19 U/L (ref 10–49)
ANION GAP: 12 mmol/L (ref 5–14)
AST (SGOT): 24 U/L (ref <=34)
BILIRUBIN TOTAL: 0.3 mg/dL (ref 0.3–1.2)
BLOOD UREA NITROGEN: 31 mg/dL — ABNORMAL HIGH (ref 9–23)
BUN / CREAT RATIO: 27
CALCIUM: 10.7 mg/dL — ABNORMAL HIGH (ref 8.7–10.4)
CHLORIDE: 105 mmol/L (ref 98–107)
CO2: 26 mmol/L (ref 20.0–31.0)
CREATININE: 1.14 mg/dL — ABNORMAL HIGH (ref 0.55–1.02)
EGFR CKD-EPI (2021) FEMALE: 48 mL/min/{1.73_m2} — ABNORMAL LOW (ref >=60)
GLUCOSE RANDOM: 189 mg/dL — ABNORMAL HIGH (ref 70–179)
POTASSIUM: 5 mmol/L — ABNORMAL HIGH (ref 3.4–4.8)
PROTEIN TOTAL: 7.5 g/dL (ref 5.7–8.2)
SODIUM: 143 mmol/L (ref 135–145)

## 2023-03-13 LAB — CBC W/ AUTO DIFF
BASOPHILS ABSOLUTE COUNT: 0 10*9/L (ref 0.0–0.1)
BASOPHILS RELATIVE PERCENT: 0.6 %
EOSINOPHILS ABSOLUTE COUNT: 0 10*9/L (ref 0.0–0.5)
EOSINOPHILS RELATIVE PERCENT: 0.1 %
HEMATOCRIT: 38.1 % (ref 34.0–44.0)
HEMOGLOBIN: 12.9 g/dL (ref 11.3–14.9)
LYMPHOCYTES ABSOLUTE COUNT: 1 10*9/L — ABNORMAL LOW (ref 1.1–3.6)
LYMPHOCYTES RELATIVE PERCENT: 29.9 %
MEAN CORPUSCULAR HEMOGLOBIN CONC: 34 g/dL (ref 32.0–36.0)
MEAN CORPUSCULAR HEMOGLOBIN: 37.5 pg — ABNORMAL HIGH (ref 25.9–32.4)
MEAN CORPUSCULAR VOLUME: 110.3 fL — ABNORMAL HIGH (ref 77.6–95.7)
MEAN PLATELET VOLUME: 8 fL (ref 6.8–10.7)
MONOCYTES ABSOLUTE COUNT: 0.3 10*9/L (ref 0.3–0.8)
MONOCYTES RELATIVE PERCENT: 9.9 %
NEUTROPHILS ABSOLUTE COUNT: 1.9 10*9/L (ref 1.8–7.8)
NEUTROPHILS RELATIVE PERCENT: 59.5 %
PLATELET COUNT: 203 10*9/L (ref 150–450)
RED BLOOD CELL COUNT: 3.45 10*12/L — ABNORMAL LOW (ref 3.95–5.13)
RED CELL DISTRIBUTION WIDTH: 13.5 % (ref 12.2–15.2)
WBC ADJUSTED: 3.2 10*9/L — ABNORMAL LOW (ref 3.6–11.2)

## 2023-03-13 LAB — LACTATE DEHYDROGENASE: LACTATE DEHYDROGENASE: 213 U/L (ref 120–246)

## 2023-03-13 NOTE — Unmapped (Signed)
 ID:  Cindy Gonzalez is an 83 y.o. with PCV    DZ CHAR:   5.6/17.8/55.8/243; K 5.3; LDH: 248  Epo < 1  Smear: No immature forms; nl RBC morphology; smudge cells; reactive lymph   Ferritin: 27.5  LDH: 399  No h/o thrombosis.     Variants of Known/Likely Clinical Significance:   Gene Coding Predicted Protein Variant allele fraction   IDH2 c.419G>A p.(Arg140Gln) 26.7%   JAK2 c.1849G>T p.(Val617Phe) 69.8%   TET2 c.4045-2A>G p.(?) 13.4%      PROGNOSIS  As clonal hematopoiesis of indeterminate prognosis (CHIP)  Score Risk  5 year AML 10 year AML 10 year OS   13.00 High Risk 24.4 ?? 4.12% 52.2 ?? 4.96% 51.2 ?? 4.51%     As a myeloproliferative neoplasm (MPN)  5 yr OS: 66/9%  AML Risk: 19.8%    ASSESSMENT:   Cindy Gonzalez is an 83 y.o. w/ a h/o erythrocytosis.  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  A bone marrow biopsy is not needed to make the diagnosis though it may offer additional prognostic information.  Since this information will note change her clinical care, I have deferred.      She also has an IDH2 and TET2 mutation.  This conveys a worse prognosis as described by the Sanger model below.  Her prognostic calculations are similar if this is considered CHIP or an MPN.      Despite this, Cindy. Gonzalez is doing well.  She continues to tolerate her current dose of hydroxyurea and her counts are within the target range.      I suspect her recent hospitalization was due to an exacerbation of her heart failure rather than pneumonia.  At this point, her PCV is not contributing to this problem.  Patients with MPNs are at risk for pulmonary hypertension, but this does not appear to be the case here.      PLAN:  1) Continue on Hydrea 500 mg.   1 tablet BID on Mon - Thur  1 tablet every day on Fri - SunStay on the Hydrea.  2) RTC in 6 months.   3) We are happy to see her if there are changes with her CBC.    4) We did discuss the importance of following her weight daily and she should contact her cardiologist or primary provider if her weight increases by  2 lbs or more in 24 hours.   5) Continue low dose aspirin  6) She should receive DVT prophylaxis if hospitalized.     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    04/2020: 3.2/13.7/41.1/225; ANC: 1.9; ALC: 1.0  02/2021: CTA: Negative; B12: 1,251  04/2021: TTE: EF 35-40%; 5.5/14.6/44.3/268; LDH: 234  10/14/21: 8.0/17.7/55.4/265; AMC: 0.1; ALC: 0.5; K: 5.0  10/19/21: JAK2: 64.9; Epo < 1  11/08/21: 5.6/17.8/55.8/243; K 5.3; LDH: 248  02/06/22: Seen by [facility]  7.8/19.5/59.7/262  Smear: No immature forms; nl RBC morphology; smudge cells; reactive lymph   Myeloid mutation panel: IDH2, JAK2, TET2  Ferritin: 27.5  LDH: 399  02/14/22: Started Hydroxyurea 3.5 g/wk  6.5/18.8/58.4/236; MCV: 84.1  02/23/22: Therapeutic phlebotomy   5.9/19.5/60.6/261  Ferritin: 25.3  02/26/22: 6.3/18.4/57.2/262; Hydrea 5.5 g/wk  03/09/22: Phlebotomy;   4.3/17.8/.55.4/153;  Ferritin: 18.3  03/17/22: 5.0/17.7/54.9/114; MCV: 87.5  04/10/22:Phlebotomy; 3.0/17.2/52.2/145; Hydrea 7 g/wk;   MCV: 90.0  ferritin: 265.5  06/12/22: 2.7/11.8/34.2/115; ANC 1.5: Hydrea reduced 6 g/wk   07/24/22: 2.4/12.5/37.0/141, ANC 1.3; Hydrea reduced to 5.5g/wk  09/18/22: 3.0/13.5/160; ANC: 1.8  12/19/22: 2.7/12.8/173  01/28/23: Admitted for shortness of breath; noted to have elevated BNP  03/13/23: 3.2/12.9/38.1/203  History of Present Illness  The patient, with a history of heart failure with reduced ejection fraction, presents for follow-up after hospitalization for pneumonia and heart evaluation. She was brought to the emergency room by her son and daughter-in-law. She provided blood pressure records to her cardiologist, Dr. Duayne Cal, who is considering a medication change.    Approximately two to three weeks ago, she was hospitalized for an upper respiratory infection and pneumonia in the right upper lung. She was admitted from Sunday evening until Wednesday at the local hospital. She did not experience significant symptoms such as fever but had been feeling unwell for about a week prior to admission. Her son and daughter-in-law brought her to the emergency room after she expressed feeling unwell during a family dinner. While in the hospital, she underwent various tests, including blood work and a chest CT scan, which showed a subtle presence of pneumonia. She was treated for pneumonia during her hospital stay.    She has a history of heart failure with a reduced ejection fraction, which was 35-40% in April 2023 and has decreased to 30-35% as of January 2025. She was previously on lisinopril but was switched to losartan to strengthen her heart muscle. She takes three Lasix pills a week to manage fluid retention and monitors her weight regularly. No significant swelling is reported. She is on a low sodium diet and monitors her weight daily. She is also taking vitamin D3, but the current dosage may be excessive, contributing to slightly elevated calcium levels. She has been monitoring her blood pressure at home, with records sent to her cardiologist, Dr. Duayne Cal.    She lives independently and has a son and daughter-in-law who visit her. She recently hosted them for dinner at her home. Her father lived to an old age.    She denies any side effects from Hydrea including GI distress, oral ulcers, or rashes.  She also denies any significant MPN sx (see the table below) She also denies any neurologic sx. Marland Kitchen      MPN 10 Score  (App: [email or web address])   [email or web address]  (Useful for monitoring patient symptoms.    Symptom Range Pre-Tx 03/13/23   Fatigue in the past 24 hours (Absent) 0 1 2 3 4 5 6 7 8 9  10 (Worst Imaginable) 0 0   Filling up quickly when you eat (Early satiety)  (Absent) 0 1 2 3 4 5 6 7 8 9  10 (Worst Imaginable) 0 0   Abdominal discomfort  (Absent) 0 1 2 3 4 5 6 7 8 9  10 (Worst Imaginable) 0 0   Inactivity (Absent) 0 1 2 3 4 5 6 7 8 9  10 (Worst Imaginable)  1 2-3   Problems with concentration - (Absent) 0 1 2 3 4 5 6 7 8 9  10 (Worst Imaginable) 0 0   Numbness/ Tingling (in my hands and feet) (Absent) 0 1 2 3 4 5 6 7 8 9  10 (Worst Imaginable) 1 - 2 2   Night sweats (Absent) 0 1 2 3 4  5  6 7 8 9 10  (Worst Imaginable)  1 0   Itching (pruritus) (Absent) 0 1 2 3 4 5 6 7 8 9  10 (Worst Imaginable) 1 0   Bone pain (diffuse not joint pain or arthritis) (Absent) 0 1 2 3 4 5 6 7 8 9  10 (Worst Imaginable) 1 - 3  2   Fever (>100 F) (Absent) 0 1 2 3 4 5 6 7 8 9  10 (Daily) 0 0   Unintentional weight loss last 6 months (Absent) 0 1 2 3 4 5 6 7 8 9  10 (Worst Imaginable) With DM meds lost 16 lbs  0     5      PHYSICAL EXAM:  VS: As recorded in Epic  GENERAL:  NAD; she is able to get to the table with minimal assistance  HEENT: OP is clear; C/S clear  LYMPH NODES: No LAN  NECK: No JVD/HJR  LUNGS: Dim   COR: 2/6 sys m; + S4; rrr  ABD:  NTND; no HSM   EXT: No edema    PMHx of pancreatitis, hepatitis, T2DM, atelectasis, acute respiratory failure with hypoxemia, nonischemic cardiomyopathy, HTN, aortic stenosis, heart failure, CAD, essential tremor, osteoarthritis, vertigo, iron deficiency anemia, insomnia, dyslipidemia, Diffuse large B cell lymphoma.              Primary prophylaxis of thrombosis  All patients: Aspirin unless    VWF activity <30%,   Platelet >?1 million,   CALR-mutated low-risk ET    PV with Hct >?45%: Add phlebotomy/cytoreduction to target Hct <?45%  Age >?60 years and/or prior history of thrombosis: Add cytoreduction  Secondary prophylaxis after thrombotic event  All patients: Cytoreduction  Typical VTE: Consider indefinite VKA for most patients; aspirin if not on VKA  Atypical VTE: Indefinite VKA  Arterial thrombosis: Aspirin    PCV Risk  Age > 60:    1   No: 0   Yes: 1    History of thrombosis:  0   No: 0   Yes: 1    Low Risk: 0 points  High Risk 1 -2 points    Low Risk patients treat with aspirin every day;   High Risk patients treat with aspirin BID*  * Consider BID aspirin for patients with microvascular complications, additional CV risk factors, or leukocytosis    PV:   Major criteria  Hgb/Hct  Men: >?16.5?g/dL or 41% or inc red blood cell mass  Women: > 16 g/dL or 32% or inc red blood cell mass  Inc in RBC Mass > 25%  Presence of JAK2 or JAK2 exon 12 mutation   BM biopsy showing hypercellularity for age with trilineage growth (panmyelosis) including prominent erythroid, granulocytic and megakaryocytic proliferation with pleomorphic, mature megakaryocytes (differences in size, without atypia)     Minor criteria  Subnormal serum erythropoietin level     Diagnosis: All 3 major or first 2 major and 1 minor

## 2023-03-13 NOTE — Unmapped (Addendum)
 PLAN:  1) Stay on the Hydrea.  2) See you in 6 months.   3) If you notice a change in CBC, feel free to contact us.    4) If you see your weight increase by  2 lbs in a day or so, let the cardiology team or primary care.        Your echo from 04/2021 had ejection fraction of 35-40%.  Your echo was normal in June 2023. Your echo from Jan of 2025 was 30-35%.     The problem with these cases is that it is hard to tell the difference between heart failure and pneumonia.  We often treat for both.  In your case, I suspect this was most heart failure.     - The testing for pneumonia was negative   - BNP was elevated.  This test goes up in heart failure   - Your CT scan is really underwhelming.    - Your echo was worse    This suggests that your weight got away from you.  If your baseline increases, you should take extra lasix.      All lab results last 24 hours:    Recent Results (from the past 24 hours)   Comprehensive Metabolic Panel    Collection Time: 03/13/23  3:01 PM   Result Value Ref Range    Sodium 143 135 - 145 mmol/L    Potassium 5.0 (H) 3.4 - 4.8 mmol/L    Chloride 105 98 - 107 mmol/L    CO2 26.0 20.0 - 31.0 mmol/L    Anion Gap 12 5 - 14 mmol/L    BUN 31 (H) 9 - 23 mg/dL    Creatinine 1.61 (H) 0.55 - 1.02 mg/dL    BUN/Creatinine Ratio 27     eGFR CKD-EPI (2021) Female 48 (L) >=60 mL/min/1.53m2    Glucose 189 (H) 70 - 179 mg/dL    Calcium 09.6 (H) 8.7 - 10.4 mg/dL You can ask your PCP about your vitamin D.     Albumin 4.3 3.4 - 5.0 g/dL    Total Protein 7.5 5.7 - 8.2 g/dL    Total Bilirubin 0.3 0.3 - 1.2 mg/dL    AST 24 <=04 U/L    ALT 19 10 - 49 U/L    Alkaline Phosphatase 32 (L) 46 - 116 U/L   Lactate dehydrogenase    Collection Time: 03/13/23  3:01 PM   Result Value Ref Range    LDH 213 120 - 246 U/L   CBC w/ Differential    Collection Time: 03/13/23  3:01 PM   Result Value Ref Range    WBC 3.2 (L) 3.6 - 11.2 10*9/L    RBC 3.45 (L) 3.95 - 5.13 10*12/L    HGB 12.9 11.3 - 14.9 g/dL    HCT 54.0 98.1 - 19.1 % MCV 110.3 (H) 77.6 - 95.7 fL    MCH 37.5 (H) 25.9 - 32.4 pg    MCHC 34.0 32.0 - 36.0 g/dL    RDW 47.8 29.5 - 62.1 %    MPV 8.0 6.8 - 10.7 fL    Platelet 203 150 - 450 10*9/L    Neutrophils % 59.5 %    Lymphocytes % 29.9 %    Monocytes % 9.9 %    Eosinophils % 0.1 %    Basophils % 0.6 %    Absolute Neutrophils 1.9 1.8 - 7.8 10*9/L    Absolute Lymphocytes 1.0 (L) 1.1 - 3.6 10*9/L  Absolute Monocytes 0.3 0.3 - 0.8 10*9/L    Absolute Eosinophils 0.0 0.0 - 0.5 10*9/L    Absolute Basophils 0.0 0.0 - 0.1 10*9/L    Macrocytosis Moderate (A) Not Present

## 2023-03-14 MED ORDER — HYDROXYUREA 500 MG CAPSULE
ORAL_CAPSULE | 11 refills | 0 days | Status: CP
Start: 2023-03-14 — End: ?

## 2023-03-20 NOTE — Unmapped (Signed)
 [facility] Refill Coordination Note    Specialty Lite Medication(s) to be Shipped:   hydroxyurea    Other medication(s) to be shipped: No additional medications requested for fill at this time     Cindy Gonzalez, DOB: 1940-10-26  Phone: [phone] (home)       All above HIPAA information was verified with patient.     Was a Nurse, learning disability used for this call? No    Changes to medications: Zera reports no changes at this time.  Changes to insurance: No      REFERRAL TO PHARMACIST     Referral to the pharmacist: Not needed      [facility]     Shipping address confirmed in Epic.     Cost and Payment: Patient has a $0 copay, payment information is not required.    Delivery Scheduled: Patient declined refill at this time due to still has a lot of medication.     Medication will be delivered via  n/a  to the  n/a  address in Epic [place].    Evangelyn Crouse M Elisabeth Cara   [facility] Specialty Technician

## 2023-04-03 DIAGNOSIS — G4700 Insomnia, unspecified: Principal | ICD-10-CM

## 2023-04-03 DIAGNOSIS — G250 Essential tremor: Principal | ICD-10-CM

## 2023-04-03 MED ORDER — LORAZEPAM 0.5 MG TABLET
ORAL_TABLET | 0 refills | 0 days | Status: CP
Start: 2023-04-03 — End: ?

## 2023-04-03 MED ORDER — FENOFIBRATE NANOCRYSTALLIZED 145 MG TABLET
ORAL_TABLET | Freq: Every day | ORAL | 0 refills | 90 days | Status: CP
Start: 2023-04-03 — End: ?

## 2023-04-03 NOTE — Unmapped (Signed)
 BP readings under Media tab

## 2023-04-03 NOTE — Unmapped (Signed)
 Patient is requesting the following refill  Requested Prescriptions     Pending Prescriptions Disp Refills    fenofibrate (TRICOR) 145 MG tablet [Pharmacy Med Name: Fenofibrate 145 MG Oral Tablet] 90 tablet 0     Sig: Take 1 tablet by mouth once daily       Recent Visits  Date Type Provider Dept   02/15/23 Office Visit Mangel, [name], DO [place] [facility] [address] [facility]   02/08/23 Office Visit Loran Senters, FNP [place] [facility] [address] [facility]   10/15/22 Office Visit Mangel, [name], DO [place] [facility] [address] At [facility]   08/16/22 Office Visit Jenell Milliner, MD [place] [facility] [address] At [facility]   07/11/22 Office Visit Johnn Hai, Loleta [name], FNP [place] [facility] [address] At [facility]   06/14/22 Office Visit Jenell Milliner, MD [place] [facility] [address] At [facility]   05/18/22 Office Visit Farrug, Emogene Morgan, NP [place] [facility] [address] At [facility]   Showing recent visits within past 365 days and meeting all other requirements  Future Appointments  Date Type Provider Dept   06/11/23 Appointment Mangel, [name], DO  [facility] [address] At [facility]   Showing future appointments within next 365 days and meeting all other requirements       Labs: Cholesterol:   Cholesterol (mg/dL)   Date Value   16/10/9602 152   ,   Triglycerides (mg/dL)   Date Value   54/09/8117 171 (H)   ,   HDL (mg/dL)   Date Value   14/78/2956 37 (L)   ,   LDL Calculated (mg/dL)   Date Value   21/30/8657 81

## 2023-04-04 DIAGNOSIS — I10 Essential (primary) hypertension: Principal | ICD-10-CM

## 2023-04-04 MED ORDER — LOSARTAN 25 MG TABLET
ORAL_TABLET | Freq: Every day | ORAL | 3 refills | 90 days | Status: CP
Start: 2023-04-04 — End: 2023-06-03

## 2023-04-04 NOTE — Unmapped (Signed)
 I spoke with [name] to confirm appointments on the following date(s):     08/21/23 Labs, Leotis Pain, [place]    Carly Allen Derry

## 2023-04-12 DIAGNOSIS — I10 Essential (primary) hypertension: Principal | ICD-10-CM

## 2023-04-12 MED ORDER — FUROSEMIDE 20 MG TABLET
ORAL_TABLET | 0 refills | 0.00 days | Status: CP
Start: 2023-04-12 — End: ?

## 2023-04-12 MED ORDER — METOPROLOL SUCCINATE ER 50 MG TABLET,EXTENDED RELEASE 24 HR
ORAL_TABLET | Freq: Every evening | ORAL | 0 refills | 90.00 days | Status: CP
Start: 2023-04-12 — End: ?

## 2023-04-12 NOTE — Unmapped (Signed)
 Patient is requesting the following refill  Requested Prescriptions     Pending Prescriptions Disp Refills    furosemide  (LASIX ) 20 MG tablet [Pharmacy Med Name: Furosemide  20 MG Oral Tablet] 36 tablet 0     Sig: TAKE 1 TABLET BY MOUTH THREE TIMES A WEEK       Recent Visits  Date Type Provider Dept   02/15/23 Office Visit Mangel, [name], DO [place] [facility] [address] [facility]   02/08/23 Office Visit Baldwin Bones, FNP [place] [facility] [address] [facility]   10/15/22 Office Visit Mangel, [name], DO [place] [facility] [address] [facility]   08/16/22 Office Visit Edgar Goods, MD [place] [facility] [address] At [facility]   07/11/22 Office Visit Albin Huh, Leellen Puller, FNP [place] [facility] [address] At [facility]   06/14/22 Office Visit Edgar Goods, MD [place] [facility] [address] At [facility]   05/18/22 Office Visit Farrug, Madolyn Schlatter, NP [place] [facility] [address] [facility]   Showing recent visits within past 365 days and meeting all other requirements  Future Appointments  Date Type Provider Dept   06/11/23 Appointment Mangel, [name], DO [place] [facility] [address] At [facility]   Showing future appointments within next 365 days and meeting all other requirements       Labs: Not applicable this refill

## 2023-04-17 ENCOUNTER — Ambulatory Visit: Admit: 2023-04-17 | Discharge: 2023-04-18 | Payer: Medicare (Managed Care)

## 2023-04-17 DIAGNOSIS — E875 Hyperkalemia: Principal | ICD-10-CM

## 2023-04-17 DIAGNOSIS — I10 Essential (primary) hypertension: Principal | ICD-10-CM

## 2023-04-17 DIAGNOSIS — N1831 Stage 3a chronic kidney disease (CMS-HCC): Principal | ICD-10-CM

## 2023-04-17 NOTE — Unmapped (Signed)
 PCP:  Mangel, [name], DO    04/17/2023  Assessment & Plan  Chronic Kidney Disease Stage A1G3a  CKD stage 3A with stable eGFR of 48, no albuminuria, likely age-related and influenced by heart failure. Better prognosis due to absence of proteinuria. While recommendations currently call for SGLT2i, I don't think she needs this for her renal disease given her very advanced age and lack of proteinuria. This could certainly be considered for her HF, but will leave that to cardiology who follows her more closely.  - Continue monitoring kidney function with primary care.  - Restrict sodium intake.    Hyperkalemia  Mild, consistently <5.5. No need for intervention. If rises above 5.5 on a consistent basis, would consider potassium restriction or patiromer, but would avoid lokelma given tendency toward sodium retention with this drug.    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   - Continue current heart failure medications: losartan , metoprolol , spironolactone .  - Follow up with cardiologist for heart failure management.    Hypertension  Blood pressure slightly elevated at 140 mmHg but this appears to not be her usual level, so will continue to monitor at home and follow up with her PCP/cardiology.  - Continue current antihypertensive regimen.    Diabetes Mellitus Type 2  Diabetes well-controlled with A1c of 7.2. Current management effective.  - Continue current diabetes management with metformin .   - Monitor A1c regularly.    Polycythemia Vera  Polycythemia vera managed with Hydrea . Hemoglobin and platelet counts normal.  - Continue Hydrea .  - Follow up with hematologist in September.    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 Reportedly cancer-free.      [name] does not need to follow-up with nephrology unless she develops albuminuria, worsening eGFR to <19ml/min/1.73m2 or there are questions regarding volume management.     Ione Sandusky K. Allean Montfort, MD  Nephrology and Hypertension  [email or web address]  [phone]  _________________________________________________________________________    Chief Complaint: CKD    History of Present Illness  The patient is an 83 year old woman with a history of CKD stage 3A, HFrEF EF 35-40%, polycythemia vera, diabetes, and remote h/o non-Hodgkin's lymphoma. The patient was referred for evaluation of her kidney disease. Her eGFR was 60-70 until 2024 when id decreased to 55-65 and now is running ~45-86ml/min/1.73m2. Notably, this most recent change occurred in the setting of starting on an ARB for her CHF. The patient's diabetes is well controlled with an A1c of 7.2 in February. The patient does not have albuminuria and her urinalysis today was negative for protein and blood.    The patient also has a history of heart failure. The patient has not been hospitalized for heart failure and has never had a heart attack or stroke. The patient takes aspirin , Lipitor , Lasix , losartan , metoprolol , and spironolactone  for her heart condition. The patient also takes Hydrea  for her polycythemia vera.    The patient has a history of arthritis and experiences pain in the back of her hips, neck, and shoulders. The patient uses home oxygen occasionally for episodes of dizziness.      Her UA in clinic today dips negative for blood and protein and her urine sediment is bland.    PAST MEDICAL HISTORY:  Past Medical History:   Diagnosis Date    At risk for falls     left ankle arthritis; cane     Coronary artery disease  Diabetes mellitus     Diffuse large B cell lymphoma     Hypertension     Impaired mobility     left ankle arthritis, cane    Visual impairment     glasses       ALLERGIES  Amoxicillin, Doxycycline, Cephalexin, Ciprofloxacin, Hydrocodone-acetaminophen, and Sulfamethoxazole    MEDICATIONS:  Current Outpatient Medications   Medication Sig Dispense Refill albuterol HFA 90 mcg/actuation inhaler Inhale 2 puffs every six (6) hours as needed for wheezing. 18 g 2    aspirin (ECOTRIN) 81 MG tablet Take 1 tablet (81 mg total) by mouth daily.      atorvastatin (LIPITOR) 10 MG tablet Take 1 tablet (10 mg total) by mouth daily. 90 tablet 2    blood sugar diagnostic (ACCU-CHEK GUIDE TEST STRIPS) Strp by Other route Two (2) times a day (30 minutes before a meal). 200 each 3    blood-glucose meter kit Use as instructed      cholecalciferol, vitamin D3, (VITAMIN D3 ORAL) Take by mouth. Takes one 3 times a week      cyanocobalamin 2000 MCG tablet Take 1 tablet (2,000 mcg total) by mouth daily.      erythromycin (ROMYCIN) 5 mg/gram (0.5 %) ophthalmic ointment APPLY A SMALL AMOUNT INTO RIGHT EYE 3 TIMES A DAY      estradiol (ESTRACE) 0.01 % (0.1 mg/gram) vaginal cream Insert 2 g into the vagina daily. Use externally prn 42.5 g 11    fenofibrate (TRICOR) 145 MG tablet Take 1 tablet by mouth once daily 90 tablet 0    fluticasone propionate (FLONASE) 50 mcg/actuation nasal spray Use 1 spray(s) in each nostril once daily 16 g 0    furosemide (LASIX) 20 MG tablet TAKE 1 TABLET BY MOUTH THREE TIMES A WEEK 36 tablet 0    hydroxyurea (HYDREA) 500 mg capsule Take 1 capsule (500 mg total) two (2) times a day Monday- Thursday AND 1 capsule (500 mg total) daily Friday- Sunday. 150 capsule 11    hydrOXYzine (ATARAX) 10 MG tablet Take 1 tablet (10 mg total) by mouth daily as needed for itching. Uses  only for bug bites      ipratropium (ATROVENT) 42 mcg (0.06 %) nasal spray 2 sprays into each nostril four (4) times a day.      ketoconazole (NIZORAL) 2 % cream APPLY  CREAM TOPICALLY ONCE DAILY FOR SCALP RASH 30 g 0    lancets (ACCU-CHEK SOFTCLIX LANCETS) Misc USE AS DIRECTED TO  CHECK  GLUCOSE  TWICE  DAILY 200 each 2    lancing device Misc USE TO CHECK BLOOD SUGAR 2 TIMES A DAY      LORazepam (ATIVAN) 0.5 MG tablet TAKE 1/2 (ONE-HALF) TABLET BY MOUTH TWICE DAILY AS NEEDED FOR ANXIETY 30 tablet 0 losartan (COZAAR) 25 MG tablet Take 1 tablet (25 mg total) by mouth daily. 90 tablet 3    metFORMIN (GLUCOPHAGE) 500 MG tablet TAKE 1 TABLET BY MOUTH IN THE MORNING AND 1/2 (ONE-HALF) ONCE DAILY WITH SUPPER 135 tablet 1    metoPROLOL succinate (TOPROL-XL) 50 MG 24 hr tablet TAKE 1 TABLET BY MOUTH AT BEDTIME 90 tablet 0    nystatin (MYCOSTATIN) 100,000 unit/gram cream Apply topically two (2) times a day. 30 g 2    RESTASIS 0.05 % ophthalmic emulsion INSTILL 1 DROP INTO EACH EYE TWICE DAILY      spironolactone (ALDACTONE) 25 MG tablet Take 1 tablet (25 mg total) by mouth daily. 90 tablet 1  No current facility-administered medications for this visit.       SOCIAL HISTORY:  Social History     Socioeconomic History    Marital status: Widowed     Spouse name: None    Number of children: None    Years of education: None    Highest education level: None   Tobacco Use    Smoking status: Never    Smokeless tobacco: Never   Vaping Use    Vaping status: Never Used   Substance and Sexual Activity    Alcohol use: Not Currently    Drug use: Never    Sexual activity: Not Currently     Social Drivers of Health     Financial Resource Strain: Low Risk  (01/29/2023)    Overall Financial Resource Strain (CARDIA)     Difficulty of Paying Living Expenses: Not hard at all   Food Insecurity: No Food Insecurity (01/29/2023)    Hunger Vital Sign     Worried About Running Out of Food in the Last Year: Never true     Ran Out of Food in the Last Year: Never true   Transportation Needs: No Transportation Needs (01/29/2023)    PRAPARE - Transportation     Lack of Transportation (Medical): No     Lack of Transportation (Non-Medical): No   Stress: No Stress Concern Present (10/16/2021)    [employer] of Occupational Health - Occupational Stress Questionnaire     Feeling of Stress : Not at all   Housing: Low Risk  (01/29/2023)    Housing     Within the past 12 months, have you ever stayed: outside, in a car, in a tent, in an overnight shelter, or temporarily in someone else's home (i.e. couch-surfing)?: No     Are you worried about losing your housing?: No       FAMILY HISTORY:  Family History   Problem Relation Age of Onset    Cancer Other     Diabetes Mother     Asthma Mother     Hypertension Father     Stroke Father        PHYSICAL EXAM:  Vitals:    04/17/23 0957   BP: 142/71   Pulse: 84   Temp: 35.3 ??C (95.5 ??F)     CONSTITUTIONAL: Alert, elderly woman in no distress  HEENT: Moist mucous membranes, oropharynx clear without erythema or exudate  NECK: Supple, no lymphadenopathy  CARDIOVASCULAR: Regular, distant HS  PULM: Clear to auscultation bilaterally  GASTROINTESTINAL: Soft, active bowel sounds, nontender  EXTREMITIES: 1+ lower extremity edema bilaterally halfway to knees  SKIN: No rashes or lesions  NEUROLOGIC: No focal motor or sensory deficits    MEDICAL DECISION MAKING    Results for orders placed or performed in visit on 03/21/23   HM DIABETES EYE EXAM   Result Value Ref Range    HM Diabetic Eye Exam Negative Retinopathy - Both Eyes Negative Retinopathy - Both Eyes, Negative Retinopathy - Right Eye, Negative Retinopathy - Left Eye        Lab Results   Component Value Date    NA 143 03/13/2023    K 5.0 (H) 03/13/2023    CL 105 03/13/2023    CO2 26.0 03/13/2023    BUN 31 (H) 03/13/2023    CREATININE 1.14 (H) 03/13/2023    ALBUMIN 4.3 03/13/2023     Lab Results   Component Value Date    EGFR 48 (L) 03/13/2023  Lab Results   Component Value Date    WBC 3.2 (L) 03/13/2023    HGB 12.9 03/13/2023    HCT 38.1 03/13/2023    PLT 203 03/13/2023     Lab Results   Component Value Date    CALCIUM  10.7 (H) 03/13/2023     Lab Results   Component Value Date    ALBCRERAT  10/15/2022      Comment:      Unable to calculate.       Urine Sediment: Bland.    IMAGING STUDIES:   CT ABDOMEN/PELVIS 07/20/2019:  Symmetric nephrograms. No hydronephrosis. Bilateral renal cysts measuring up to 5.0 cm in the right upper pole (3:66). Multiple bilateral parapelvic cysts. Additional subcentimeter hypodensities are too small to catheterize bilaterally.

## 2023-05-06 DIAGNOSIS — G250 Essential tremor: Principal | ICD-10-CM

## 2023-05-06 DIAGNOSIS — G4700 Insomnia, unspecified: Principal | ICD-10-CM

## 2023-05-06 MED ORDER — LORAZEPAM 0.5 MG TABLET
ORAL_TABLET | ORAL | 0 refills | 0.00000 days | Status: CP
Start: 2023-05-06 — End: ?

## 2023-05-07 DIAGNOSIS — I10 Essential (primary) hypertension: Principal | ICD-10-CM

## 2023-05-07 MED ORDER — FUROSEMIDE 20 MG TABLET
ORAL_TABLET | ORAL | 0 refills | 0.00000 days | Status: CP
Start: 2023-05-07 — End: ?

## 2023-05-07 NOTE — Unmapped (Signed)
 Patient is requesting the following refill  Requested Prescriptions     Pending Prescriptions Disp Refills    furosemide  (LASIX ) 20 MG tablet [Pharmacy Med Name: Furosemide  20 MG Oral Tablet] 36 tablet 0     Sig: TAKE 1 TABLET BY MOUTH THREE TIMES A WEEK       Recent Visits  Date Type Provider Dept   02/15/23 Office Visit Mangel, [name], DO [place] [facility] [address] [facility]   02/08/23 Office Visit Baldwin Bones, FNP [place] [facility] [address] [facility]   10/15/22 Office Visit Mangel, [name], DO [place] [facility] [address] [facility]   08/16/22 Office Visit Edgar Goods, MD [place] [facility] [address] At [facility]   07/11/22 Office Visit Albin Huh, Leellen Puller, FNP [place] [facility] [address] At [facility]   06/14/22 Office Visit Edgar Goods, MD [place] [facility] [address] At [facility]   05/18/22 Office Visit Farrug, Madolyn Schlatter, NP [place] [facility] [address] [facility]   Showing recent visits within past 365 days and meeting all other requirements  Future Appointments  Date Type Provider Dept   06/11/23 Appointment Mangel, [name], DO [place] [facility] [address] At [facility]   Showing future appointments within next 365 days and meeting all other requirements       Labs: Not applicable this refill

## 2023-05-22 ENCOUNTER — Ambulatory Visit
Admission: EM | Admit: 2023-05-22 | Discharge: 2023-05-22 | Disposition: A | Discharge status: Home / Self Care | Attending: Emergency Medicine | Admitting: Emergency Medicine

## 2023-05-22 DIAGNOSIS — J0141 Acute recurrent pansinusitis: Secondary | ICD-10-CM

## 2023-05-22 DIAGNOSIS — H9202 Otalgia, left ear: Secondary | ICD-10-CM

## 2023-05-22 MED ORDER — AZITHROMYCIN 250 MG PO TABS: 250.0000 mg | ORAL_TABLET | Freq: Every day | ORAL | 0 refills | Status: DC

## 2023-05-22 NOTE — ED Provider Notes (Signed)
 [facility]    CSN: [id number] Arrival date & time: 05/22/23  1227      History   Chief Complaint Chief Complaint  Patient presents with   Otalgia   Nasal Congestion    HPI Jacqueline Arroyo is a 83 y.o. female.   83 year old female, Jacqueline Arroyo, presents to urgent care for evaluation of left ear pain and sinus congestion for 5 days.  Patient has tried Flonase  without relief.  Patient requesting Z-Pak" only thing that works for me".  The history is provided by the patient. No language interpreter was used.    Past Medical History:  Diagnosis Date   Diabetes mellitus without complication (HCC)    Hypertension     Patient Active Problem List   Diagnosis Date Noted   Left ear pain 05/22/2023   Acute recurrent pansinusitis 05/22/2023    Past Surgical History:  Procedure Laterality Date   ABDOMINAL HYSTERECTOMY     BACK SURGERY     CHOLECYSTECTOMY     FOOT SURGERY      OB History   No obstetric history on file.      Home Medications    Prior to Admission medications   Medication Sig Start Date End Date Taking? Authorizing Provider  acetic acid 2 % otic solution SMARTSIG:Left Ear 01/19/21  Yes [provider]  albuterol (VENTOLIN HFA) 108 (90 Base) MCG/ACT inhaler Inhale into the lungs. 07/12/19  Yes [provider]  amLODipine (NORVASC) 10 MG tablet Take 10 mg by mouth daily. 09/01/20  Yes [provider]  aspirin 81 MG EC tablet Take by mouth. 07/17/19  Yes [provider]  atorvastatin (LIPITOR) 10 MG tablet Take 1 tablet by mouth daily. 03/01/20  Yes [provider]  calcium -vitamin D (OSCAL WITH D) 500-200 MG-UNIT TABS tablet Take by mouth.   Yes [provider]  Cyanocobalamin  2000 MCG TBCR Take by mouth.   Yes [provider]  cycloSPORINE (RESTASIS) 0.05 % ophthalmic emulsion INSTILL 1 DROP INTO EACH EYE TWICE DAILY 01/29/20  Yes [provider]  estradiol (ESTRACE) 0.1 MG/GM  vaginal cream SMARTSIG:Gram(s) Vaginal 3 Times a Week 08/17/20  Yes [provider]  fenofibrate (TRICOR) 145 MG tablet Take by mouth. 06/27/20  Yes [provider]  FLOVENT  HFA 110 MCG/ACT inhaler SMARTSIG:2 Puff(s) By Mouth Twice Daily 05/09/20  Yes [provider]  fluticasone  (FLONASE ) 50 MCG/ACT nasal spray 1 spray into each nostril daily. 12/27/20 05/22/23 Yes [provider]  furosemide (LASIX) 20 MG tablet Take by mouth. 07/23/19  Yes [provider]  hydroxyurea (HYDREA) 500 MG capsule Take by mouth. 02/26/22  Yes [provider]  ketoconazole (NIZORAL) 2 % cream Apply topically daily. 03/11/20  Yes [provider]  Lancets (FREESTYLE) lancets Check blood sugar twice daily 12/27/20  Yes [provider]  lisinopril (ZESTRIL) 30 MG tablet Take 1 tablet by mouth daily. 12/29/20 05/22/23 Yes [provider]  LORazepam (ATIVAN) 0.5 MG tablet Take by mouth. 07/31/19  Yes [provider]  meclizine (ANTIVERT) 12.5 MG tablet Take 12.5 mg by mouth 2 (two) times daily as needed. 01/18/21  Yes [provider]  meloxicam (MOBIC) 7.5 MG tablet Take 1 tablet by mouth daily. 01/26/20  Yes [provider]  metFORMIN (GLUCOPHAGE) 500 MG tablet Take 500 mg by mouth daily. 08/31/20  Yes [provider]  metoprolol succinate (TOPROL-XL) 50 MG 24 hr tablet Take 50 mg by mouth 2 (two) times daily. 09/08/19  Yes [provider]  mupirocin  cream (BACTROBAN ) 2 % Apply 1 Application topically 2 (two) times daily. 07/07/22  Yes Carollynn Cirri, NP  spironolactone (ALDACTONE) 25 MG tablet Take 25 mg by mouth daily. 08/06/20  Yes [provider]  triamcinolone  cream (KENALOG ) 0.1 % Apply 1 Application topically 2 (two) times daily. 07/07/22  Yes Carollynn Cirri, NP  UNABLE TO FIND Med Name: HCTZ 10 mg   Yes [provider]  amoxicillin -clavulanate (AUGMENTIN ) 875-125 MG tablet Take 1 tablet by  mouth every 12 (twelve) hours. 11/05/22   Ethlyn Herd, MD  azithromycin  (ZITHROMAX ) 250 MG tablet Take 1 tablet (250 mg total) by mouth daily. 2 tabs po on day 1, 1 tab po on days 2-5 05/22/23   Lenetta Piche, NP  methocarbamol  (ROBAXIN ) 500 MG tablet Take 1 tablet (500 mg total) by mouth 2 (two) times daily. 01/14/22   Brimage, Vondra, DO    Family History History reviewed. No pertinent family history.  Social History Social History   Tobacco Use   Smoking status: Never   Smokeless tobacco: Never  Vaping Use   Vaping status: Never Used  Substance Use Topics   Alcohol use: Never   Drug use: Never     Allergies   Amoxicillin , Cephalexin, Ciprofloxacin, Hydrocodone-acetaminophen, and Sulfamethoxazole   Review of Systems Review of Systems  Constitutional:  Positive for fatigue.  HENT:  Positive for congestion and ear pain.   All other systems reviewed and are negative.    Physical Exam Triage Vital Signs ED Triage Vitals  Encounter Vitals Group     BP 05/22/23 1328 (!) 142/74     Systolic BP Percentile --      Diastolic BP Percentile --      Pulse Rate 05/22/23 1328 77     Resp 05/22/23 1328 16     Temp 05/22/23 1328 98.5 F (36.9 C)     Temp Source 05/22/23 1328 Oral     SpO2 05/22/23 1328 97 %     Weight 05/22/23 1327 172 lb (78 kg)     Height --      Head Circumference --      Peak Flow --      Pain Score 05/22/23 1332 0     Pain Loc --      Pain Education --      Exclude from Growth Chart --    No data found.  Updated Vital Signs BP (!) 142/74 (BP Location: Left Arm)   Pulse 77   Temp 98.5 F (36.9 C) (Oral)   Resp 16   Wt 172 lb (78 kg)   SpO2 97%   BMI 31.46 kg/m   Visual Acuity Right Eye Distance:   Left Eye Distance:   Bilateral Distance:    Right Eye Near:   Left Eye Near:    Bilateral Near:     Physical Exam Vitals and nursing note reviewed.  Constitutional:      General: She is not in acute distress.    Appearance: She  is well-developed and well-groomed.  HENT:     Head: Normocephalic.     Right Ear: Tympanic membrane is retracted.     Left Ear: Tympanic membrane is retracted.     Nose: Mucosal edema and congestion present.     Right Sinus: Maxillary sinus tenderness present.     Left Sinus: Maxillary sinus tenderness present.     Mouth/Throat:     Lips: Pink.  Mouth: Mucous membranes are moist.     Pharynx: Uvula midline. Posterior oropharyngeal erythema present.  Eyes:     General: Lids are normal.     Conjunctiva/sclera: Conjunctivae normal.     Pupils: Pupils are equal, round, and reactive to light.  Neck:     Trachea: No tracheal deviation.  Cardiovascular:     Rate and Rhythm: Normal rate and regular rhythm.     Heart sounds: Murmur heard.  Pulmonary:     Effort: Pulmonary effort is normal.     Breath sounds: Normal breath sounds and air entry.  Abdominal:     General: Bowel sounds are normal.     Palpations: Abdomen is soft.     Tenderness: There is no abdominal tenderness.  Musculoskeletal:        General: Normal range of motion.     Cervical back: Normal range of motion.  Lymphadenopathy:     Cervical: No cervical adenopathy.  Skin:    General: Skin is warm and dry.     Findings: No rash.  Neurological:     General: No focal deficit present.     Mental Status: She is alert and oriented to person, place, and time.     GCS: GCS eye subscore is 4. GCS verbal subscore is 5. GCS motor subscore is 6.     Cranial Nerves: No cranial nerve deficit.     Sensory: No sensory deficit.  Psychiatric:        Attention and Perception: Attention normal.        Mood and Affect: Mood normal.        Speech: Speech normal.        Behavior: Behavior normal. Behavior is cooperative.      UC Treatments / Results  Labs (all labs ordered are listed, but only abnormal results are displayed) Labs Reviewed - No data to display  EKG   Radiology No results found.  Procedures Procedures  (including critical care time)  Medications Ordered in UC Medications - No data to display  Initial Impression / Assessment and Plan / UC Course  I have reviewed the triage vital signs and the nursing notes.  Pertinent labs & imaging results that were available during my care of the patient were reviewed by me and considered in my medical decision making (see chart for details).    Discussed extensively with patient that she does not have a ear infection at today's visit, patient is anxious and says the only thing that make her feel better is a Z-Pak, she states she has tried Flonase  without relief. Discussed with pt she can try a zpak but it isn't recommended for sinusitis, can use for pharyngitis, Patient verbalized understanding to this provider, wishes to go forward with Zpak use, strict go to ER precautions given.  Ddx: Acute recurrent pansinusitis, pharyngitis, left ear pain,anxiety, allergies Final Clinical Impressions(s) / UC Diagnoses   Final diagnoses:  Left ear pain  Acute recurrent pansinusitis     Discharge Instructions      Take zpak as prescribed Drink plenty of water Keep appt with PCP as scheduled If you have new or worsening issues(chest pain,palpitations, or worsening symptoms go to Er for further evaluation).    ED Prescriptions     Medication Sig Dispense Auth. Provider   azithromycin  (ZITHROMAX ) 250 MG tablet Take 1 tablet (250 mg total) by mouth daily. 2 tabs po on day 1, 1 tab po on days 2-5 6 tablet Tj Kitchings, Eveleen Hinds, NP  PDMP not reviewed this encounter.   Peter Brands, NP 05/22/23 1515

## 2023-05-22 NOTE — Discharge Instructions (Addendum)
 Take zpak as prescribed Drink plenty of water Keep appt with PCP as scheduled If you have new or worsening issues(chest pain,palpitations, or worsening symptoms go to Er for further evaluation).

## 2023-05-22 NOTE — ED Triage Notes (Signed)
 Pt c/o L ear pain & congestion x5 days. Has tried flonase  w/o relief.

## 2023-05-28 ENCOUNTER — Emergency Department
Admit: 2023-05-28 | Discharge: 2023-05-28 | Disposition: A | Discharge status: Home / Self Care | Payer: Medicare (Managed Care)

## 2023-05-28 DIAGNOSIS — H6592 Unspecified nonsuppurative otitis media, left ear: Principal | ICD-10-CM

## 2023-05-28 MED ORDER — IBUPROFEN 600 MG TABLET
ORAL_TABLET | Freq: Three times a day (TID) | ORAL | 0 refills | 5.00000 days | Status: CP | PRN
Start: 2023-05-28 — End: ?

## 2023-05-28 NOTE — Unmapped (Signed)
 [facility]  Emergency Department Provider Note     ED Clinical Impression     Final diagnoses:   Left otitis media with effusion (Primary)      Impression, Medical Decision Making, ED Course     HPI/impression/plan of care: 83 y.o. female with PMH as stated below presenting to the emergency department for evaluation of left ear pain.  Patient was seen in the urgent care and given a Z-Pak for the same and otitis media.  This was approximately 5 days ago.  She reports she took 3 days of the antibiotic only.  Pain has not improved.  She denies fever, denies subjective fever, dizziness headache visual changes shortness of breath chest pain.  On exam she is nontoxic-appearing in no acute distress.  Her neuroexam is nonfocal reassuring without neurodeficits at this time.  There is bilateral otitis media with effusion most prominent on the left.  No infection noted.  She would see his regular patient.  She would like something for pain control.  We discussed that her creatinine is slightly elevated based on her most recent CMP and that she could take a very low dose of ibuprofen 300 mg to 600 mg once daily for no more than 2 to 3 days but I did encourage her that this typically will resolve on its own.  No further workup will be pursued in the emergency department today.  She is hemodynamically stable and appropriate for discharge and will follow-up with her PCP for ongoing symptom management if symptoms persist.       History     Chief Complaint  Chief Complaint   Patient presents with    Ear Problem         Past Medical History:   Diagnosis Date    At risk for falls     left ankle arthritis; cane     Coronary artery disease     Diabetes mellitus       Diffuse large B cell lymphoma       Hypertension     Impaired mobility     left ankle arthritis, cane    Visual impairment     glasses       Past Surgical History:   Procedure Laterality Date    CHOLECYSTECTOMY      HYSTERECTOMY      OOPHORECTOMY         No current facility-administered medications for this encounter.    Current Outpatient Medications:     albuterol HFA 90 mcg/actuation inhaler, Inhale 2 puffs every six (6) hours as needed for wheezing., Disp: 18 g, Rfl: 2    aspirin (ECOTRIN) 81 MG tablet, Take 1 tablet (81 mg total) by mouth daily., Disp: , Rfl:     atorvastatin (LIPITOR) 10 MG tablet, Take 1 tablet (10 mg total) by mouth daily., Disp: 90 tablet, Rfl: 2    blood sugar diagnostic (ACCU-CHEK GUIDE TEST STRIPS) Strp, by Other route Two (2) times a day (30 minutes before a meal)., Disp: 200 each, Rfl: 3    blood-glucose meter kit, Use as instructed, Disp: , Rfl:     cholecalciferol, vitamin D3, (VITAMIN D3 ORAL), Take by mouth. Takes one 3 times a week, Disp: , Rfl:     cyanocobalamin 2000 MCG tablet, Take 1 tablet (2,000 mcg total) by mouth daily., Disp: , Rfl:     erythromycin (ROMYCIN) 5 mg/gram (0.5 %) ophthalmic ointment, APPLY A SMALL AMOUNT INTO RIGHT EYE 3 TIMES A DAY, Disp: ,  Rfl:     estradiol (ESTRACE) 0.01 % (0.1 mg/gram) vaginal cream, Insert 2 g into the vagina daily. Use externally prn, Disp: 42.5 g, Rfl: 11    fenofibrate (TRICOR) 145 MG tablet, Take 1 tablet by mouth once daily, Disp: 90 tablet, Rfl: 0    fluticasone propionate (FLONASE) 50 mcg/actuation nasal spray, Use 1 spray(s) in each nostril once daily, Disp: 16 g, Rfl: 0    furosemide (LASIX) 20 MG tablet, TAKE 1 TABLET BY MOUTH THREE TIMES A WEEK, Disp: 36 tablet, Rfl: 0    hydroxyurea (HYDREA) 500 mg capsule, Take 1 capsule (500 mg total) two (2) times a day Monday- Thursday AND 1 capsule (500 mg total) daily Friday- Sunday., Disp: 150 capsule, Rfl: 11    hydrOXYzine (ATARAX) 10 MG tablet, Take 1 tablet (10 mg total) by mouth daily as needed for itching. Uses  only for bug bites, Disp: , Rfl:     ipratropium (ATROVENT) 42 mcg (0.06 %) nasal spray, 2 sprays into each nostril four (4) times a day., Disp: , Rfl:     ketoconazole (NIZORAL) 2 % cream, APPLY  CREAM TOPICALLY ONCE DAILY FOR SCALP RASH, Disp: 30 g, Rfl: 0    lancets (ACCU-CHEK SOFTCLIX LANCETS) Misc, USE AS DIRECTED TO  CHECK  GLUCOSE  TWICE  DAILY, Disp: 200 each, Rfl: 2    lancing device Misc, USE TO CHECK BLOOD SUGAR 2 TIMES A DAY, Disp: , Rfl:     LORazepam (ATIVAN) 0.5 MG tablet, TAKE 1/2 (ONE-HALF) TABLET BY MOUTH TWICE DAILY AS NEEDED FOR ANXIETY, Disp: 30 tablet, Rfl: 0    losartan (COZAAR) 25 MG tablet, Take 1 tablet (25 mg total) by mouth daily., Disp: 90 tablet, Rfl: 3    metFORMIN (GLUCOPHAGE) 500 MG tablet, TAKE 1 TABLET BY MOUTH IN THE MORNING AND 1/2 (ONE-HALF) ONCE DAILY WITH SUPPER, Disp: 135 tablet, Rfl: 1    metoPROLOL succinate (TOPROL-XL) 50 MG 24 hr tablet, TAKE 1 TABLET BY MOUTH AT BEDTIME, Disp: 90 tablet, Rfl: 0    nystatin (MYCOSTATIN) 100,000 unit/gram cream, Apply topically two (2) times a day., Disp: 30 g, Rfl: 2    RESTASIS 0.05 % ophthalmic emulsion, INSTILL 1 DROP INTO EACH EYE TWICE DAILY, Disp: , Rfl:     spironolactone (ALDACTONE) 25 MG tablet, Take 1 tablet (25 mg total) by mouth daily., Disp: 90 tablet, Rfl: 1    Allergies  Amoxicillin, Doxycycline, Cephalexin, Ciprofloxacin, Hydrocodone-acetaminophen, and Sulfamethoxazole    Family History  Family History   Problem Relation Age of Onset    Cancer Other     Diabetes Mother     Asthma Mother     Hypertension Father     Stroke Father        Social History  Social History     Tobacco Use    Smoking status: Never    Smokeless tobacco: Never   Vaping Use    Vaping status: Never Used   Substance Use Topics    Alcohol use: Not Currently    Drug use: Never        Physical Exam     VITAL SIGNS:      Vitals:    05/28/23 1432   BP: 159/76   Pulse: 88   Resp: 20   Temp: 36.9 ??C (98.4 ??F)   TempSrc: Oral   SpO2: 95%   Weight: 78 kg (172 lb)       Constitutional: Alert and oriented. No acute distress.  Eyes:  Conjunctivae are normal.  HEENT: Normocephalic and atraumatic. Conjunctivae clear. No congestion. Moist mucous membranes.   Cardiovascular: Rate as above, regular rhythm. Normal and symmetric distal pulses. Brisk capillary refill. Normal skin turgor.  Respiratory: Normal respiratory effort. Breath sounds are normal. There are no wheezing or crackles heard.  Gastrointestinal: Soft, non-distended, non-tender.  Genitourinary: Deferred.  Musculoskeletal: Non-tender with normal range of motion in all extremities.  Neurologic: Normal speech and language. No gross focal neurologic deficits are appreciated. Patient is moving all extremities equally, face is symmetric at rest and with speech.  Skin: Skin is warm, dry and intact. No rash noted.  Psychiatric: Mood and affect are normal. Speech and behavior are normal.     Radiology     No orders to display       Pertinent labs & imaging results that were available during my care of the patient were independently interpreted by me and considered in my medical decision making (see chart for details).    Portions of this record have been created using Scientist, clinical (histocompatibility and immunogenetics). Dictation errors have been sought, but may not have been identified and corrected.         Yetta Helper, FNP  05/28/23 1736

## 2023-05-28 NOTE — Unmapped (Signed)
 Pt presents with left ear pain x 1 week. Chronic ear issues. Seen at UC 1 week ago and prescribed antibiotics but did not complete abx.

## 2023-06-05 DIAGNOSIS — J302 Other seasonal allergic rhinitis: Principal | ICD-10-CM

## 2023-06-05 MED ORDER — FLUTICASONE PROPIONATE 50 MCG/ACTUATION NASAL SPRAY,SUSPENSION
NASAL | 0 refills | 0.00000 days | Status: CP
Start: 2023-06-05 — End: ?

## 2023-06-05 NOTE — Unmapped (Signed)
 Patient is requesting the following refill  Requested Prescriptions     Pending Prescriptions Disp Refills    fluticasone propionate (FLONASE) 50 mcg/actuation nasal spray [Pharmacy Med Name: Fluticasone Propionate 50 MCG/ACT Nasal Suspension] 16 g 0     Sig: Use 1 spray(s) in each nostril once daily       Recent Visits  Date Type Provider Dept   02/15/23 Office Visit Mangel, [name], DO [place] [facility] [address] [facility]   02/08/23 Office Visit Baldwin Bones, FNP [place] [facility] [address] At [facility]   10/15/22 Office Visit Mangel, [name], DO [place] [facility] [address] At [facility]   08/16/22 Office Visit Edgar Goods, MD [place] [facility] [address] At [facility]   07/11/22 Office Visit Albin Huh, Leellen Puller, FNP [place] [facility] [address] At [facility]   06/14/22 Office Visit Edgar Goods, MD [place] [facility] [address] At [facility]   Showing recent visits within past 365 days and meeting all other requirements  Future Appointments  Date Type Provider Dept   06/11/23 Appointment Mangel, [name], DO [place] [facility] [address] At [facility]   Showing future appointments within next 365 days and meeting all other requirements       Labs: Not applicable this refill

## 2023-06-11 ENCOUNTER — Ambulatory Visit
Admit: 2023-06-11 | Discharge: 2023-06-12 | Payer: Medicare (Managed Care) | Attending: Student in an Organized Health Care Education/Training Program | Primary: Student in an Organized Health Care Education/Training Program

## 2023-06-11 DIAGNOSIS — R7989 Other specified abnormal findings of blood chemistry: Principal | ICD-10-CM

## 2023-06-11 DIAGNOSIS — E1165 Type 2 diabetes mellitus with hyperglycemia: Principal | ICD-10-CM

## 2023-06-11 DIAGNOSIS — E559 Vitamin D deficiency, unspecified: Principal | ICD-10-CM

## 2023-06-11 DIAGNOSIS — I10 Essential (primary) hypertension: Principal | ICD-10-CM

## 2023-06-11 LAB — BASIC METABOLIC PANEL
ANION GAP: 11 mmol/L (ref 5–14)
BLOOD UREA NITROGEN: 37 mg/dL — ABNORMAL HIGH (ref 9–23)
BUN / CREAT RATIO: 32
CALCIUM: 9.8 mg/dL (ref 8.7–10.4)
CHLORIDE: 108 mmol/L — ABNORMAL HIGH (ref 98–107)
CO2: 24 mmol/L (ref 20.0–31.0)
CREATININE: 1.16 mg/dL — ABNORMAL HIGH (ref 0.55–1.02)
EGFR CKD-EPI (2021) FEMALE: 47 mL/min/1.73m2 — ABNORMAL LOW (ref >=60)
GLUCOSE RANDOM: 151 mg/dL — ABNORMAL HIGH (ref 70–99)
POTASSIUM: 5.6 mmol/L — ABNORMAL HIGH (ref 3.5–5.1)
SODIUM: 143 mmol/L (ref 135–145)

## 2023-06-11 LAB — VITAMIN B12: VITAMIN B-12: 516 pg/mL (ref 211–911)

## 2023-06-11 LAB — HEMOGLOBIN A1C
ESTIMATED AVERAGE GLUCOSE: 186 mg/dL
HEMOGLOBIN A1C: 8.1 % — ABNORMAL HIGH (ref 4.8–5.6)

## 2023-06-11 MED ORDER — RESTASIS 0.05 % EYE DROPS IN A DROPPERETTE
Freq: Two times a day (BID) | OPHTHALMIC | 1 refills | 0.00000 days | Status: CP
Start: 2023-06-11 — End: ?

## 2023-06-11 NOTE — Unmapped (Signed)
 Assessment and Plan:       83 yo female presents for chronic care follow up     (1) Hypertension, Non-ischemic cardiomypathy, CAD, HLD  BP Readings from Last 3 Encounters:   06/11/23 133/66   05/28/23 145/67   04/17/23 142/71   -Cardiology visit on 03/05/2023:   1. Coronary artery disease involving native coronary artery of native heart without angina pectoris (Primary): Stable nonobstructive disease, most recent LDL was 81 but she is not consistently taking her statin. CTA in ED 01/27/2023 showed moderate coronary calcifications. Patient continues on aspirin without problem.     2. Heart failure with mid-range ejection fraction (CMS-HCC): Patient had ED visit 01/27/2023 with echo which showed newly reduced LVEF at 30-35% and was diuresed in ED. Patient remains compliant on furosemide. I ordered for an evaluation of her potassium levels today. No changes to treatment plan at this time.      3. Primary hypertension: Blood pressure was not well-controlled today in clinic and her home blood pressure readings also not consistent.  I have asked her to record her blood pressures at home on a consistent basis and bring those to me next Monday so we can potentially increase her losartan dose. Patient stopped amlodipine herself and was switched from lisinopril to losartan by charge for unclear reasons since last visit. I instructed her that she shouldn't change her prescribed medication on her own. Currently she is on amlodipine 10 mg daily, losartan 25 mg daily, metoprolol succinate 50 mg nightly.  -Checking BMP  .-     Basic Metabolic Panel    (2) Extranodal Marginal Zone B-Cell Lymphoma, Thrombocytopenia, erythrocytosis  -Noted on previous PCP visit on 06/14/2022: she was seen by [facility] heme onc in  02/2022 and encounter details reviewed with the patient today. CBC done at that time with H/H of 18/57 with normal white blood cell count, platelet count and MCV.  Iron and ferritin level were low normal at that time. She was advised to stop iron supplement . She is on Vit B 12 supplement. She was started on Hydrea 500 mg M, W, F and 1000 mg on all other days.  She is had her first therapeutic phlebotomy in 03/2022.   -Following with Hematology/Oncology, last visit 03/13/2023  ASSESSMENT:   --Cindy Gonzalez is an 83 y.o. w/ a h/o erythrocytosis.  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  A bone marrow biopsy is not needed to make the diagnosis though it may offer additional prognostic information.  Since this information will note change her clinical care, I have deferred.     ---She also has an IDH2 and TET2 mutation.  This conveys a worse prognosis as described by the Sanger model below.  Her prognostic calculations are similar if this is considered CHIP or an MPN.     ---Despite this, Cindy Gonzalez is doing well.  She continues to tolerate her current dose of hydroxyurea and her counts are within the target range.     ---I suspect her recent hospitalization was due to an exacerbation of her heart failure rather than pneumonia.  At this point, her PCV is not contributing to this problem.  Patients with MPNs are at risk for pulmonary hypertension, but this does not appear to be the case here.       PLAN:  1) Continue on Hydrea 500 mg.   1 tablet BID on Mon -  Thur  1 tablet every day on Fri - SunStay on the Hydrea.  2) RTC in 6 months.   3) We are happy to see her if there are changes with her CBC.    4) We did discuss the importance of following her weight daily and she should contact her cardiologist or primary provider if her weight increases by  2 lbs or more in 24 hours.   5) Continue low dose aspirin  6) She should receive DVT prophylaxis if hospitalized.      (3) Type 2 DM  -Previous A1C 7.4% (06/14/22) > 6.7% in 10/2022> 7.2% (02/2023)  -She is currently taking Metformin 500mg  in the AM and 250mg  in the PM  -Given her age, A1C goal 7.0-8.0%  -Rechecking A1C. If A1C >8.0%, we will need to increase Metformin or consider changing to Januvia vs Rybelsus  -     Hemoglobin A1c  -Diabetic eye exam and foot exam due in 10/2023    (4)Vertigo, Essential Tremor, Insomnia  -Essential tremor is present but she is not having symptoms of vertigo   -Previously tried Propranolol but caused sedation and stopped.   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 Better prognosis due to absence of proteinuria. While recommendations currently call for SGLT2i, I don't think she needs this for her renal disease given her very advanced age and lack of proteinuria. This could certainly be considered for her HF, but will leave that to cardiology who follows her more closely.  - Continue monitoring kidney function with primary care.  - Restrict sodium intake.     Hyperkalemia: Mild, consistently <5.5. No need for intervention. If rises above 5.5 on a consistent basis, would consider potassium restriction or patiromer, but would avoid lokelma given tendency toward sodium retention with this drug.     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   - Continue current heart failure medications: losartan, metoprolol, spironolactone.  - Follow up with cardiologist for heart failure management.     Hypertension: Blood pressure slightly elevated at 140 mmHg but this appears to not be her usual level, so will continue to monitor at home and follow up with her PCP/cardiology.  - Continue current antihypertensive regimen.     Diabetes Mellitus Type 2: Diabetes well-controlled with A1c of 7.2. Current management effective.  - Continue current diabetes management with metformin.   - Monitor A1c regularly.     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 Hemoglobin and platelet counts normal.  - Continue Hydrea.  - Follow up with hematologist in September.     Non-Hodgkin's Lymphoma: Non-Hodgkin's lymphoma treated with surgery and radiation. Reportedly cancer-free.   ---->CindyMonigue H Gonzalez does not need to follow-up with nephrology unless she develops albuminuria, worsening eGFR to <5ml/min/1.73m2 or there are questions regarding volume management.      (6) Skin carcinoma   -Previously diagnosed with BCC. 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 We discussed caution with this given ADE/SE and her kidney function   -Previously was taking Tramadol 25mg  PRN  for  severe pain.   -PDMP was reviewed. We discuss risk for polypharmacy and falls. She verbalized understanding to risks  -Short course provided  -     traMADoL; Take 0.5 tablets (25 mg total) by mouth daily as needed for pain or severe pain.      HEALTH MAINTENANCE  -Asthma: Prescribed Albuterol PRN  -Hx of Constipation: Continue OTC---Metamucil, Miralax, Citrucel  -Atrophic Vaginitis: To use Topical Estrace Vaginal cream.  -Mammogram ws ordered but still needs to be obtained   -DEXA on 03/13/2022: Osteopenia. Repeat in 2026  -Refill provided:  -     Restasis; Apply 1 drop to eye every twelve (12) hours.  -Labs below:   -     Vitamin B12 Level  -     Vitamin D 25 Hydroxy (25OH D2 + D3)      Chronic care follow up in 4 months. Medicare AWV to be scheduled with RN  -Continue to use Nasal spray for ET Dysfunction. 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 Patient has had a hysterectomy.      ROS:     ROS negative unless otherwise noted in HPI      Vital Signs:     Wt Readings from Last 3 Encounters:   06/11/23 80.1 kg (176 lb 9.6 oz)   05/28/23 78 kg (172 lb)   04/17/23 79.8 kg (176 lb)     Temp Readings from Last 3 Encounters:   06/11/23 36.1 ??C (96.9 ??F)   05/28/23 36.9 ??C (98.4 ??F) (Oral)   04/17/23 35.3 ??C (95.5 ??F) (Temporal)     BP Readings from Last 3 Encounters:   06/11/23 133/66   05/28/23 145/67   04/17/23 142/71     Pulse Readings from Last 3 Encounters:   06/11/23 72   05/28/23 78   04/17/23 84       Objective:     General Appearance: Alert, cooperative  Head and Neck: Normocephalic, atraumatic.   EENT: EOMI. Conjuntiva clear. 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   Respiratory: respiratory effort normal, no cough or audible wheeze appreciated  Musculoskeletal: Gait is stable with use of a cane.  Extremities :Trace edema present  Neurologic: Alert and oriented x3. CN II-XII grossly intact. Head tremor present  Psychiatric: Mood and affect appropriate for situation.

## 2023-06-11 NOTE — Unmapped (Signed)
 Copied from CRM #[id number]. Topic: Medication Refill - Medication Refill  >> Jun 11, 2023 11:21 AM Felisa Hose wrote:      [name],    [name] called requesting a medication refill for the following:    ?? Medication: Hydroxyurea 500 mg  ?? Pharmacy: [facility]      The expected turnaround time is 3-4 business days     Thank you,  Lynnda Sas  [facility]  [phone]

## 2023-06-12 MED ORDER — TRAMADOL 50 MG TABLET
ORAL_TABLET | Freq: Every day | ORAL | 0 refills | 15.00000 days | Status: CP | PRN
Start: 2023-06-12 — End: 2023-06-12

## 2023-06-13 DIAGNOSIS — G250 Essential tremor: Principal | ICD-10-CM

## 2023-06-13 DIAGNOSIS — I10 Essential (primary) hypertension: Principal | ICD-10-CM

## 2023-06-13 DIAGNOSIS — G4700 Insomnia, unspecified: Principal | ICD-10-CM

## 2023-06-13 MED ORDER — SPIRONOLACTONE 25 MG TABLET
ORAL_TABLET | Freq: Every day | ORAL | 0 refills | 90.00000 days | Status: CP
Start: 2023-06-13 — End: ?

## 2023-06-13 MED ORDER — LORAZEPAM 0.5 MG TABLET
ORAL_TABLET | ORAL | 0 refills | 0.00000 days | Status: CP
Start: 2023-06-13 — End: ?

## 2023-06-13 NOTE — Unmapped (Signed)
 Patient is requesting the following refill  Requested Prescriptions     Pending Prescriptions Disp Refills    LORazepam (ATIVAN) 0.5 MG tablet [Pharmacy Med Name: LORazepam 0.5 MG Oral Tablet] 30 tablet 0     Sig: TAKE 1/2 (ONE-HALF) TABLET BY MOUTH TWICE DAILY AS NEEDED FOR ANXIETY    spironolactone (ALDACTONE) 25 MG tablet [Pharmacy Med Name: Spironolactone 25 MG Oral Tablet] 90 tablet 0     Sig: Take 1 tablet by mouth once daily       Recent Visits  Date Type Provider Dept   06/11/23 Office Visit Mangel, [name], DO [place] [facility] [address] [facility]   02/15/23 Office Visit Mangel, [name], DO [place] [facility] [address] At [facility]   02/08/23 Office Visit Baldwin Bones, FNP [place] [facility] [address] At [facility]   10/15/22 Office Visit Mangel, [name], DO [place] [facility] [address] At [facility]   08/16/22 Office Visit Edgar Goods, MD [place] [facility] [address] At [facility]   07/11/22 Office Visit Albin Huh, Leellen Puller, FNP [place] [facility] [address] At [facility]   06/14/22 Office Visit Edgar Goods, MD [place] [facility] [address] At [facility]   Showing recent visits within past 365 days and meeting all other requirements  Future Appointments  Date Type Provider Dept   06/20/23 Appointment Donnice Gale, RN [place] [facility] [address] At [facility]   10/11/23 Appointment Mangel, [name], DO [place] [facility] [address] At [facility]   Showing future appointments within next 365 days and meeting all other requirements       Labs: Not applicable this refill

## 2023-06-13 NOTE — Unmapped (Signed)
 Here is your personalized prevention plan based on your Annual Wellness Visit today.    Medicare Screening & Prevention Guidelines Recommendations Dates Completed HM Status and Next Due Follow-Up   Colorectal Cancer Screening Patients 45 to 75: stool cards annually OR colonoscopy every 10 years (or more frequently if high risk) OR FIT-DNA every 3 years.  Colonoscopy date: 01/10/2015  FOBT/FIT date: Not Found  Sigmoidoscopy date: Not Found  FIT-DNA date: Not Found Health Maintenance Summary    This patient has no relevant Health Maintenance data.      Up to date   DEXA Bone Density Measurement Patients age 35-95 to have a Dexa every 5 years in postmenopausal women and high risk males. Males will defer to PCP DEXA date: 03/13/2022 Health Maintenance Summary    -      Upcoming     DEXA Scan (Every 2 Years) Next due on 03/12/2024    03/13/2022  Dexa Bone Density Skeletal    04/02/2019  HM DEXA SCAN                   Up to date   Diabetes Eye Exam Annually if Diabetic. Eye Exam date: 03/21/2023 Health Maintenance Summary    -      Upcoming     Retinal Eye Exam (Yearly) Next due on 03/20/2024    03/21/2023  HM Diabetic Eye Exam component of HM DIABETES EYE EXAM    03/01/2021  HM Diabetic Eye Exam component of HM DIABETES EYE EXAM    02/02/2020  HM Diabetic Eye Exam component of Population Health Fundus   Photography - OU - Both Eyes    11/06/2018  HM Diabetic Eye Exam component of HM DIABETES EYE EXAM                 Up to date   Diabetes Foot Exam Annually if Diabetic. Most Recent Foot Exam Date: 10/15/2022 Health Maintenance Summary    -      Upcoming     Foot Exam (Yearly) Next due on 10/15/2023    10/15/2022  SmartData: WORKFLOW - DIABETES - DIABETIC FOOT EXAM PERFORMED      10/31/2021  SmartData: WORKFLOW - DIABETES - DIABETIC FOOT EXAM PERFORMED      10/25/2020  SmartData: WORKFLOW - DIABETES - DIABETIC FOOT EXAM PERFORMED      11/02/2019  SmartData: WORKFLOW - DIABETES - DIABETIC FOOT EXAM PERFORMED Up to date   Diabetes Urine Albumin/Creatinine Ratio Annually if Diabetic UACR Date: 10/31/2021   Health Maintenance Summary    -      Upcoming     Urine Albumin/Creatinine Ratio (Yearly) Next due on 10/15/2023    10/15/2022  Registry Metric: [facility] DM LAST ALBQTUR    10/31/2021  Albumin/Creatinine Ratio, Urine component of   Albumin/creatinine urine ratio    10/25/2020  Albumin/Creatinine Ratio, Urine component of   Albumin/creatinine urine ratio                   Up to date   Diabetes Hemoglobin A1c Every 3 or 6 months depending on last result Last Hemoglobin A1c Date: 06/11/2023 Health Maintenance Summary    -      Upcoming     Hemoglobin A1c (Every 6 Months) Next due on 12/11/2023    06/11/2023  Hemoglobin A1c component of Hemoglobin A1c    02/15/2023  Hemoglobin A1c component of Hemoglobin A1c    10/15/2022  Hemoglobin A1c component of Hemoglobin A1c  06/14/2022  HGB A1C, RAP/PDS component of POCT glycosylated hemoglobin   (Hb A1C)    03/06/2022  Hemoglobin A1c component of Hemoglobin A1c Only the first 5   history entries have been loaded, but more history exists.                   Up to date   Heart Disease Screening (fasting lipid panel) Minimum of every 5 years, patients age 67-75,  if no apparent signs or symptoms of heart disease. LDL date: 06/14/2022  Total choleseterol date: 06/14/2022  HDL date: 06/14/2022  Triglycerides date: 06/14/2022 Health Maintenance Summary    This patient has no relevant Health Maintenance data.      Up to date   Mammogram Screening Age 14-74 every 2 years.  Mammogram date: 03/13/2022 Health Maintenance Summary    This patient has no relevant Health Maintenance data.    Up to date   Pelvic Exam & Pap Smear Women ages 36 to 1 every 3 years with negative cytology (pap smear)  OR,   women ages 50 to 92 every 5 years if they have had both a negative pap and human papillomavirus (HPV) OR,  Every 3 years if they had a positive HPV result Pap Smear date: Not Found  HPV date: Not Found Health Maintenance Summary    This patient has no relevant Health Maintenance data.    Not within age range   Hepatitis C Screening A one-time screening for HCV infection for adults age 61 to 85 years old.  HCV screening date: 07/21/2019 Health Maintenance Summary    This patient has no relevant Health Maintenance data.      Complete   Covid-19 Vaccine For persons 5 and older Dose 1 02/09/2019  Dose 2 03/02/2019  Dose 3 11/12/2019  Dose 4        Health Maintenance   Topic Date Due    COVID-19 Vaccine (4 - 2024-25 season) 09/02/2022      Up to date   Tdap Every 10 years (will not be covered by Medicare). Check with your pharmacy to see if you are eligible for this vaccine based on your insurance. DTap/Tdap/TD vaccination: 02/28/2017 Health Maintenance Summary    -      Upcoming     DTaP/Tdap/Td Vaccines (2 - Td or Tdap) Next due on 03/01/2027    02/28/2017  Imm Admin: TD, ADSORBED, 5LF TETANUS TOXOID,   PF(TENIVAC/DECAVAC)                 Up to date   Influenza Vaccine Annually  Influenza Vaccination: 10/02/2022   Health Maintenance Summary    -      Completed or No Longer Recommended     Influenza Vaccine (Series Information) Completed    10/02/2022  Imm Admin: INFLUENZA IIV3 HIGH DOSE 34YRS+(FLUZONE)    10/05/2021  Imm Admin: INFLUENZA QUAD ADJUVANTED 33YR UP(FLUAD)    10/25/2020  Imm Admin: INFLUENZA INJ MDCK PF, QUAD,(FLUCELVAX)(48MO AND UP   EGG FREE)    10/01/2019  Imm Admin: INFLUENZA INJ MDCK PF, QUAD,(FLUCELVAX)(48MO AND UP   EGG FREE)                   Up to date   Pneumococcal Vaccine  For people >=65, or with an underlying condition Pneumonia vaccination: 05/07/2018   Health Maintenance Summary    -      Completed or No Longer Recommended     Pneumococcal Vaccine 50+ (Series Information)  Completed    05/07/2018  Imm Admin: PNEUMOCOCCAL POLYSACCHARIDE 23-VALENT    10/29/2013  Imm Admin: Pneumococcal Conjugate 13-Valent    10/02/2010  Imm Admin: PNEUMOCOCCAL POLYSACCHARIDE 23-VALENT                   Up to date   RSV Vaccine Current recommendations is to have shared decision making with your PCP, please talk with your provider. RSV Vaccination: Not Found Health Maintenance Summary    This patient has no relevant Health Maintenance data.    Provided information on how to obtain at pharmacy   Zoster Vaccine Healthy adults 50 years and older receive 2 doses of recombinant zoster vaccine two to six months apart (may not be covered by Medicare). Check with your pharmacy to see if you are eligible for this vaccine based on your insurance. Zoster vaccination: Not Found Health Maintenance Summary    -      Current Care Gaps     Zoster Vaccines (1 of 2) Never done    No completion history exists for this topic.                 Provided information on how to obtain at pharmacy      Thank you for completing your Medicare Annual Wellness Visit today. If you have any questions about your Medicare Annual Wellness Visit please call our Care Manager, Jenniffer Coria RN CM at ( [phone]).  Please see your updated Personalized Prevention Plan which details the status of your preventative services, agreed upon goals to improve your fitness and health, and educational materials on health topics specific to you

## 2023-06-14 LAB — VITAMIN D 25 HYDROXY: VITAMIN D, TOTAL (25OH): 24.9 ng/mL (ref 20.0–80.0)

## 2023-06-14 NOTE — Unmapped (Signed)
 [facility] Refill Coordination Note    Specialty Lite Medication(s) to be Shipped:   hydroxyurea    Other medication(s) to be shipped: No additional medications requested for fill at this time     Cindy Gonzalez, DOB: 11-14-1940  Phone: [phone] (home)       All above HIPAA information was verified with patient.     Was a Nurse, learning disability used for this call? No    Changes to medications: Arely reports no changes at this time.  Changes to insurance: No      REFERRAL TO PHARMACIST     Referral to the pharmacist: Not needed      SHIPPING     Hydroxyurea 500mg  capsules patient has 7 days of medication on hand     Shipping address confirmed in Epic.     Cost and Payment: Patient has a $0 copay, payment information is not required.    Delivery Scheduled: Yes, Expected medication delivery date: 06/18.     Medication will be delivered via Next Day Courier to the prescription address in Epic WAM.    Camelia LITTIE Hope   [facility] Specialty Technician

## 2023-06-16 DIAGNOSIS — E1165 Type 2 diabetes mellitus with hyperglycemia: Principal | ICD-10-CM

## 2023-06-16 DIAGNOSIS — E875 Hyperkalemia: Principal | ICD-10-CM

## 2023-06-16 MED ORDER — JANUMET XR 50 MG-500 MG TABLET,EXTENDED RELEASE
ORAL_TABLET | Freq: Every day | ORAL | 1 refills | 90.00000 days | Status: CP
Start: 2023-06-16 — End: 2023-12-13

## 2023-06-17 NOTE — Unmapped (Signed)
 Patient returning nurse call regarding results, She states she does not check her [place] chart.. Please reach back out to patient .

## 2023-06-17 NOTE — Unmapped (Signed)
-----   Message from [place], DO sent at 06/16/2023  6:19 PM EDT -----  Please contact this patient with the following  -Your vitamin b12 is in normal range  -Your kidney function does appear stable at this time but your potassium was a little elevated. I would like to repeat this lab this week--please schedule a lab visit for this. I will place this   order  -Your A1C did go from 7.2% to 8.1%. Given this, I am going to change your diabetes medication--We will stop the Metformin but I will start a combination medication with metformin and januvia which   you will take once a day.     Please let me know if she has any questions     Thank you     ----- Message -----  From: Lab, Background User  Sent: 06/11/2023   4:25 PM EDT  To: [name] Mangel, DO

## 2023-06-17 NOTE — Unmapped (Signed)
 Returned patient's call. Patient informed of labs and verbalized understanding. Patient made lab appointment for 06/24/23 at 1:40pm. Patient is taking Metformin today, will pick up new prescription and start Janumet tormorrow, 06/18/2023.

## 2023-06-17 NOTE — Unmapped (Signed)
 Regarding labs Per Dr Keven, left voice message to return my call also sent MyChart message.

## 2023-06-18 MED FILL — HYDROXYUREA 500 MG CAPSULE: ORAL | 30 days supply | Qty: 150 | Fill #0

## 2023-06-20 DIAGNOSIS — Z0000 Encounter for general adult medical examination without abnormal findings: Principal | ICD-10-CM

## 2023-06-20 NOTE — Unmapped (Signed)
 ADVANCE CARE PLANNING NOTE    Discussion Date:  June 20, 2023    Patient has decisional capacity:  Yes    Patient has selected a Health Care Decision-Maker if loses capacity: Yes    Health Care Decision Maker as of 06/20/2023    HCDM (HCPOA): Cindy Gonzalez, Cindy Gonzalez - Son - [phone]    HCDM, First Alternate: Cindy Gonzalez - Daughter - [phone]    Discussion Participants:.Patient and care manager    Communication of Medical Status/Prognosis: Patient states current health is good  patient follows up with PCP and specialists regularly       Communication of Treatment Goals/Options: Patient has a Living Will and Healthcare Power of [place], will consider bringing documents for scanning at next follow up visit.       Treatment Decisions: 06/20/2023    The patient reports they have a Healthcare power of attorney Living Will but have not brought a copy to the office.  Have discussed the importance of having this included in the medical record. .      Case Manager facilitated discussion with patient and  about advance care planning and documentation including Healthcare power of attorney Living Will . The patient voluntarily agreed to bring to office Healthcare power of attorney Living Will.   Note: forms that require notarization require two witnessess that are non-family members nor health care workers.            I spent 4 minutes providing voluntary advance care planning services for this patient.

## 2023-06-20 NOTE — Unmapped (Signed)
 Inform/Regulatory Low priority, no response needed unless change in care.     I am reaching out to Cindy Gonzalez as part of the embedded care management team regarding her Annual Medicare Wellness visit.  Patient is at risk for BMI Abnormal and/or patient would benefit from meeting with an RD for Chronic Condition Management, Incontinence, and Physical Activity Limitations.    Patient advised to Review AVS for education related to risk..  Patient is at risk for BMI Abnormal, Incontinence, and Physical Activity Limitations.    Next appt with PCP: 10/11/2023     See chart for med list if needed    Sharing communication as part of regulatory requirements.              This auto-generated note displays some results identified during the AWV Assessments. For full results, please see the Flowsheet Links under the Additional Documentation section of this encounter in Chart Review.        The patient reports they are physically located in [place]  and is currently: at home. I conducted a phone visit.  I spent 39 minutes on the phone call with the patient on the date of service .       Location of Patient:[place]      Patient was unable to report the following vital signs today due to visit being performed by telehealth and patient lack of required equipment to obtain: Temperature. Please see flowsheets for any vital signs that were reported this visit.      Patient reported Vital Sign: Blood pressure, Weight, Height,HR      Social Determinants of Health:  Social Determinants of Health Screened today.  Interventions Provided: I provided an intervention for the Physical Activity and Other Bladder Incontinent  SDOH domain. The intervention was Education      The following list of current providers and suppliers reviewed and updated this visit.  Patient Care Team:  Mangel, [name], DO as PCP - General (Family Medicine)  Mangel, [name], DO as PCP - General-ATTRIBUTED  Deen, Velma Hamilton, MD (Cardiovascular Disease)  Dittus, Lonni Loving, MD as Consulting Physician (Hematology and Oncology)  Ramm, Calton Idell BODILY as Nurse Practitioner (Cardiology)  Luanna Rebecca Knowlton, AGNP as Nurse Practitioner (Hematology and Oncology)  [facility] (Dentistry)  [facility] (Ophthalmology)    Medications and supplements were reviewed and updated this visit. See medication list in encounter summary.     A personalized prevent plan was updated and reviewed with the patient. A copy has been provided to the patient in Patient Instructions.    Recent Hospitalizations reviewed:  No recent hospitalizations     General Health:     Patient's BMI is 31.83 Patient answered No to nutrition services referral: Increasing fruits and vegetables and Healthy diet and New patient stated goals:        Pain identified during today's visit        06/20/23 1014   PainSc: 0-No pain       Safety:   Patient answered No to leaking stool or losing control of their bowels. Patient answered (!) Yes (leakage due to lasix medication) to leaking urine.       Psychosocial Assessment:       PHQ-2 Score: 0  PHQ-9 Score: 1    PHQ score less than 5 indicating none to minimal depression.  Supervising physician or APP notified via routed chart.  No further action needed.     Screening complete, no  depression identified / no further action needed today      Social Drivers of Health     Food Insecurity: No Food Insecurity (06/20/2023)    Hunger Vital Sign     Worried About Running Out of Food in the Last Year: Never true     Ran Out of Food in the Last Year: Never true   Tobacco Use: Low Risk  (06/20/2023)    Patient History     Smoking Tobacco Use: Never     Smokeless Tobacco Use: Never     Passive Exposure: Not on file   Transportation Needs: No Transportation Needs (06/20/2023)    PRAPARE - Transportation     Lack of Transportation (Medical): No     Lack of Transportation (Non-Medical): No   Alcohol Use: Not At Risk (06/20/2023)    Alcohol Use     How often do you have a drink containing alcohol?: Never     How many drinks containing alcohol do you have on a typical day when you are drinking?: 1 - 2     How often do you have 5 or more drinks on one occasion?: Never   Housing: Low Risk  (06/20/2023)    Housing     Within the past 12 months, have you ever stayed: outside, in a car, in a tent, in an overnight shelter, or temporarily in someone else's home (i.e. couch-surfing)?: No     Are you worried about losing your housing?: No   Physical Activity: Insufficiently Active (06/20/2023)    Exercise Vital Sign     Days of Exercise per Week: 3 days     Minutes of Exercise per Session: 40 min   Utilities: Low Risk  (06/20/2023)    Utilities     Within the past 12 months, have you been unable to get utilities (heat, electricity) when it was really needed?: No   Stress: No Stress Concern Present (06/20/2023)    [employer] of Occupational Health - Occupational Stress Questionnaire     Feeling of Stress: Not at all   Interpersonal Safety: Not At Risk (06/20/2023)    Interpersonal Safety     Unsafe Where You Currently Live: No     Physically Hurt by Anyone: No     Abused by Anyone: No   Substance Use: Low Risk  (06/20/2023)    Substance Use     In the past year, how often have you used prescription drugs for non-medical reasons?: Never     In the past year, how often have you used illegal drugs?: Never     In the past year, have you used any substance for non-medical reasons?: No   Intimate Partner Violence: Not At Risk (06/20/2023)    Humiliation, Afraid, Rape, and Kick questionnaire     Fear of Current or Ex-Partner: No     Emotionally Abused: No     Physically Abused: No     Sexually Abused: No   Social Connections: Moderately Integrated (06/20/2023)    Social Connection and Isolation Panel     Frequency of Communication with Friends and Family: More than three times a week     Frequency of Social Gatherings with Friends and Family: Three times a week     Attends Religious Services: More than 4 times per year     Active Member of Clubs or Organizations: Yes     Attends Banker Meetings: More than 4 times per year  Marital Status: Widowed   Physicist, medical Strain: Low Risk  (06/20/2023)    Overall Financial Resource Strain (CARDIA)     Difficulty of Paying Living Expenses: Not hard at all   Health Literacy: Low Risk  (06/20/2023)    Health Literacy     : Never   Internet Connectivity: No Internet connectivity concern identified (06/20/2023)    Internet Connectivity     Do you have access to internet services: Yes     How do you connect to the internet: Personal Device at home     Is your internet connection strong enough for you to watch video on your device without major problems?: Yes     Do you have enough data to get through the month?: Yes     Does at least one of the devices have a camera that you can use for video chat?: Yes

## 2023-06-24 ENCOUNTER — Encounter
Admit: 2023-06-24 | Discharge: 2023-06-24 | Payer: Medicare (Managed Care) | Attending: Student in an Organized Health Care Education/Training Program | Primary: Student in an Organized Health Care Education/Training Program

## 2023-06-24 DIAGNOSIS — E875 Hyperkalemia: Principal | ICD-10-CM

## 2023-06-24 LAB — BASIC METABOLIC PANEL
ANION GAP: 9 mmol/L (ref 5–14)
BLOOD UREA NITROGEN: 32 mg/dL — ABNORMAL HIGH (ref 9–23)
BUN / CREAT RATIO: 28
CALCIUM: 10.5 mg/dL — ABNORMAL HIGH (ref 8.7–10.4)
CHLORIDE: 108 mmol/L — ABNORMAL HIGH (ref 98–107)
CO2: 25 mmol/L (ref 20.0–31.0)
CREATININE: 1.15 mg/dL — ABNORMAL HIGH (ref 0.55–1.02)
EGFR CKD-EPI (2021) FEMALE: 47 mL/min/{1.73_m2} — ABNORMAL LOW (ref >=60)
GLUCOSE RANDOM: 129 mg/dL (ref 70–179)
POTASSIUM: 4.9 mmol/L — ABNORMAL HIGH (ref 3.4–4.8)
SODIUM: 142 mmol/L (ref 135–145)

## 2023-06-24 NOTE — Unmapped (Signed)
 FYI:  Patient was in today for blood work and wanted to let you know that she did pick up the new medication, Janumet  however, she has not started it as she read the side effects. She is concerned with it messing her stomach up.

## 2023-06-25 NOTE — Unmapped (Signed)
 Labs per Dr Keven

## 2023-06-25 NOTE — Unmapped (Signed)
-----   Message from [place], DO sent at 06/25/2023  8:06 AM EDT -----  Please contact patient with the following  -Your potassium has improved from before   -Your kidney function is stable at this time     Please let me know if she has questions.       ----- Message -----  From: Lab, Background User  Sent: 06/24/2023  10:41 PM EDT  To: [name] Mangel, DO

## 2023-06-26 NOTE — Unmapped (Signed)
 Spoke with patient and she has started the medication (2 days of it) she stated so far so good. She will let us  know if she has any concerns.

## 2023-07-06 MED ORDER — METOPROLOL SUCCINATE ER 50 MG TABLET,EXTENDED RELEASE 24 HR
ORAL_TABLET | Freq: Every evening | ORAL | 0 refills | 0.00000 days
Start: 2023-07-06 — End: ?

## 2023-07-08 MED ORDER — METOPROLOL SUCCINATE ER 50 MG TABLET,EXTENDED RELEASE 24 HR
ORAL_TABLET | Freq: Every evening | ORAL | 2 refills | 90.00000 days | Status: CP
Start: 2023-07-08 — End: ?

## 2023-07-09 DIAGNOSIS — J302 Other seasonal allergic rhinitis: Principal | ICD-10-CM

## 2023-07-09 MED ORDER — FLUTICASONE PROPIONATE 50 MCG/ACTUATION NASAL SPRAY,SUSPENSION
NASAL | 0 refills | 0.00000 days | Status: CP
Start: 2023-07-09 — End: ?

## 2023-07-09 NOTE — Unmapped (Signed)
 Patient is requesting the following refill  Requested Prescriptions     Pending Prescriptions Disp Refills    fluticasone  propionate (FLONASE ) 50 mcg/actuation nasal spray [Pharmacy Med Name: Fluticasone  Propionate 50 MCG/ACT Nasal Suspension] 16 g 0     Sig: Use 1 spray(s) in each nostril once daily       Recent Visits  Date Type Provider Dept   06/11/23 Office Visit Mangel, [name], DO [place] [facility] [address] [facility]   02/15/23 Office Visit Mangel, [name], DO [place] [facility] [address] [facility]   02/08/23 Office Visit Geralene Levorn Geralds, FNP [place] [facility] [address] [facility]   10/15/22 Office Visit Mangel, [name], DO [place] [facility] [address] [facility]   08/16/22 Office Visit Derenda Rockers, MD [place] [facility] [address] [facility]   07/11/22 Office Visit Lora, Alfonso HERO, FNP [place] [facility] [address] [facility]   Showing recent visits within past 365 days and meeting all other requirements  Future Appointments  Date Type Provider Dept   10/11/23 Appointment Mangel, [name], DO [place] [facility] [address] [facility]   Showing future appointments within next 365 days and meeting all other requirements       Labs: Not applicable this refill

## 2023-07-16 MED ORDER — LOSARTAN 50 MG TABLET
ORAL_TABLET | Freq: Every day | ORAL | 3 refills | 90.00000 days | Status: CP
Start: 2023-07-16 — End: 2024-08-19

## 2023-07-16 NOTE — Unmapped (Signed)
 Assessment/Plan     Assessment & Plan  Heart failure with mid-range ejection fraction  Ejection fraction 30-35%. Symptoms stable with some weight gain and dyspnea. On home oxygen. Decided against Jardiance  due to infection risk. Plan to optimize medication.  - Increase losartan  from 25 mg to 50 mg daily.  - Continue metoprolol  succinate 50 mg daily.  - Continue spironolactone  25 mg daily.  - Order echocardiogram.  - Schedule lab work in two weeks for potassium.  - Consider Entresto if losartan  tolerated.    Coronary artery disease without angina  Coronary artery calcifications noted. LDL elevated, not on statin.  - Continue aspirin  81 mg daily.    Primary hypertension  Blood pressure mostly controlled with occasional elevations. Current regimen includes losartan , metoprolol , spironolactone . Discussed maintaining systolic BP above 100 to avoid symptoms.  - Increase losartan  from 25 mg to 50 mg daily.  - Monitor blood pressure regularly.  - Adjust diuretic based on fluid retention symptoms.     No follow-ups on file.    Subjective:   Cindy Gonzalez, Cindy Pap, DO    History of Present Illness  Cindy Gonzalez is an 83 year old female with coronary artery disease, heart failure with mildly reduced ejection fraction, and hypertension who presents for follow-up.    She was last seen on March 05, 2023, for coronary artery disease, heart failure with mildly reduced ejection fraction, and hypertension. At that time, she had coronary artery calcifications and an elevated LDL but was not on a statin. She started taking aspirin  1 mg daily.    She has a newly reduced ejection fraction of 30-35% and is currently taking losartan  25 mg daily, metoprolol  succinate 50 mg daily, spironolactone  25 mg daily, and amlodipine . She takes 'three fluid pills a week' and notes that she does not feel much better since starting the low-dose blood pressure medication after a hospitalization for pneumonia. Her blood pressures have been mostly stable, though occasionally higher than desired. She experiences some nocturia, getting up once or twice a night, but does not find it problematic.    Her blood sugar increased from 7 to 8.1, leading to a change in her diabetes medication to Janumet , which includes metformin  and Januvia. She declined Jardiance  due to concerns about side effects, particularly bladder and yeast infections, which she has experienced before. She uses a fluconazole  cream for itching.    She feels 'okay' with her breathing and uses oxygen occasionally, about once a week. Her heart function showed a significant change in January.       Past Medical History  Problem List[1]    Medications:  Current Medications[2]    Allergies  Allergies[3]    Social History:   Short Social History[4]    Family History:  Family History[5]    ROS- 12 system review is negative other than what is specified in the History of Present Illness.      Objective:   Physical Exam  There were no vitals filed for this visit.  Physical Exam  MEASUREMENTS: Weight- 172-173.       Laboratory data:  Results  LABS  Potassium: 4.0    DIAGNOSTIC  Ejection fraction: 30-35% (03/05/2023)         I have personally reviewed the images of the following diagnostic studies.                             [1]   Patient Active Problem  List  Diagnosis    Pancreatitis (HHS-HCC)    Hepatitis    Hypertension    Coronary artery disease    Atelectasis    Heart failure with mid-range ejection fraction       Hypoxemia    Essential tremor    Hyponatremia    Acute disorder of liver    NICM (nonischemic cardiomyopathy)       Extranodal marginal zone B-cell lymphoma    Leukopenia    Diabetes mellitus       Dyslipidemia    History of non-Hodgkin's lymphoma    Arthritis    Seborrheic dermatitis    Insomnia    Obesity    Osteoarthritis    Mild aortic stenosis    Dryness of vagina    Skin carcinoma    Vertigo    History of pneumonia    History of constipation    Erythrocytosis    Iron deficiency anemia Iron deficiency    Thrombocytopenia, unspecified    BMI 34.0-34.9,adult    History of vertigo    Polycythemia vera       Shortness of breath    Tachycardia    URI (upper respiratory infection)    Mild intermittent asthma without complication (HHS-HCC)    Anxiety disorder    Long term current use of aspirin     On home oxygen therapy   [2]   Current Outpatient Medications   Medication Sig Dispense Refill    albuterol  HFA 90 mcg/actuation inhaler Inhale 2 puffs every six (6) hours as needed for wheezing. 18 g 2    aspirin  (ECOTRIN) 81 MG tablet Take 1 tablet (81 mg total) by mouth daily.      atorvastatin  (LIPITOR ) 10 MG tablet Take 1 tablet (10 mg total) by mouth daily. 90 tablet 2    blood sugar diagnostic (ACCU-CHEK GUIDE TEST STRIPS) Strp by Other route Two (2) times a day (30 minutes before a meal). 200 each 3    blood-glucose meter kit Use as instructed      cholecalciferol, vitamin D3, (VITAMIN D3 ORAL) Take by mouth. Takes one 3 times a week      cyanocobalamin 2000 MCG tablet Take 1 tablet (2,000 mcg total) by mouth daily.      erythromycin (ROMYCIN) 5 mg/gram (0.5 %) ophthalmic ointment APPLY A SMALL AMOUNT INTO RIGHT EYE 3 TIMES A DAY      estradiol  (ESTRACE ) 0.01 % (0.1 mg/gram) vaginal cream Insert 2 g into the vagina daily. Use externally prn 42.5 g 11    fenofibrate  (TRICOR ) 145 MG tablet Take 1 tablet by mouth once daily 90 tablet 0    fluticasone  propionate (FLONASE ) 50 mcg/actuation nasal spray Use 1 spray(s) in each nostril once daily 16 g 0    furosemide  (LASIX ) 20 MG tablet TAKE 1 TABLET BY MOUTH THREE TIMES A WEEK 36 tablet 0    hydroxyurea  (HYDREA ) 500 mg capsule Take 1 capsule (500 mg total) by mouth two (2) times a day Monday- Thursday AND 1 capsule (500 mg total) daily Friday- Sunday. 150 capsule 11    hydrOXYzine  (ATARAX ) 10 MG tablet Take 1 tablet (10 mg total) by mouth daily as needed for itching. Uses  only for bug bites      ibuprofen  (MOTRIN ) 600 MG tablet Take 1 tablet (600 mg total) by mouth every eight (8) hours as needed for pain. 15 tablet 0    ipratropium (ATROVENT) 42 mcg (0.06 %) nasal spray 2 sprays into each nostril  four (4) times a day.      ketoconazole  (NIZORAL ) 2 % cream APPLY  CREAM TOPICALLY ONCE DAILY FOR SCALP RASH 30 g 0    lancets (ACCU-CHEK SOFTCLIX LANCETS) Misc USE AS DIRECTED TO  CHECK  GLUCOSE  TWICE  DAILY 200 each 2    lancing device Misc USE TO CHECK BLOOD SUGAR 2 TIMES A DAY      LORazepam  (ATIVAN ) 0.5 MG tablet TAKE 1/2 (ONE-HALF) TABLET BY MOUTH TWICE DAILY AS NEEDED FOR ANXIETY 30 tablet 0    losartan  (COZAAR ) 25 MG tablet Take 1 tablet (25 mg total) by mouth daily. 90 tablet 3    metoPROLOL  succinate (TOPROL -XL) 50 MG 24 hr tablet TAKE 1 TABLET BY MOUTH AT BEDTIME 90 tablet 2    RESTASIS  0.05 % ophthalmic emulsion Apply 1 drop to eye every twelve (12) hours. 0.4 mL 1    sitagliptin phos-metFORMIN  (JANUMET  XR) 50-500 mg TM24 Take 1 tablet by mouth daily. 90 tablet 1    spironolactone  (ALDACTONE ) 25 MG tablet Take 1 tablet by mouth once daily 90 tablet 0    traMADol  (ULTRAM ) 50 mg tablet Take 0.5 tablets (25 mg total) by mouth daily as needed for pain or severe pain. 15 tablet 0     No current facility-administered medications for this visit.   [3]   Allergies  Allergen Reactions    Amoxicillin       Other Reaction(s): Unknown    Doxycycline  Other (See Comments)     Makes her nervous and shaky.    Cephalexin Palpitations    Ciprofloxacin Palpitations     Legacy System: Delight    Onset Date: <blank>    Substance Legacy/Cerner: Cipro / Cipro (Legacy value)    Category: Drug    Severity Legacy/Cerner: <blank> / Unknown    Reaction(s): makes head feel funny  nausea    Comments: <blank>    Hydrocodone-Acetaminophen  Nausea And Vomiting and Other (See Comments)    Sulfamethoxazole Palpitations   [4]   Social History  Tobacco Use    Smoking status: Never    Smokeless tobacco: Never   Vaping Use    Vaping status: Never Used   Substance Use Topics    Alcohol use: Not Currently Drug use: Never   [5]   Family History  Problem Relation Age of Onset    Cancer Other     Diabetes Mother     Asthma Mother     Hypertension Father     Stroke Father

## 2023-07-16 NOTE — Unmapped (Signed)
 Increase losartan  to 50 mg  Come get labs checked in 2 weeks  You will get called for your echo appointment  Follow up in 2-3 months

## 2023-07-16 NOTE — Unmapped (Signed)
 Pt hs a list of her BP readings for two weeks. She said he HGB A1C was elevated and her PCP changed her med to Janumet .  Denies chest pain or sob.

## 2023-07-24 ENCOUNTER — Inpatient Hospital Stay: Admit: 2023-07-24 | Discharge: 2023-07-24 | Payer: Medicare (Managed Care)

## 2023-07-30 ENCOUNTER — Ambulatory Visit: Admit: 2023-07-30 | Discharge: 2023-07-31 | Payer: Medicare (Managed Care)

## 2023-07-30 ENCOUNTER — Encounter: Admit: 2023-07-30 | Discharge: 2023-07-31 | Payer: Medicare (Managed Care)

## 2023-07-30 LAB — BASIC METABOLIC PANEL
ANION GAP: 13 mmol/L (ref 5–14)
BLOOD UREA NITROGEN: 34 mg/dL — ABNORMAL HIGH (ref 9–23)
BUN / CREAT RATIO: 29
CALCIUM: 10.7 mg/dL — ABNORMAL HIGH (ref 8.7–10.4)
CHLORIDE: 107 mmol/L (ref 98–107)
CO2: 24.3 mmol/L (ref 20.0–31.0)
CREATININE: 1.19 mg/dL — ABNORMAL HIGH (ref 0.55–1.02)
EGFR CKD-EPI (2021) FEMALE: 45 mL/min/1.73m2 — ABNORMAL LOW (ref >=60)
GLUCOSE RANDOM: 152 mg/dL (ref 70–179)
POTASSIUM: 5.2 mmol/L — ABNORMAL HIGH (ref 3.4–4.8)
SODIUM: 144 mmol/L (ref 135–145)

## 2023-08-06 DIAGNOSIS — G250 Essential tremor: Principal | ICD-10-CM

## 2023-08-06 DIAGNOSIS — G4700 Insomnia, unspecified: Principal | ICD-10-CM

## 2023-08-06 MED ORDER — LORAZEPAM 0.5 MG TABLET
ORAL_TABLET | ORAL | 0 refills | 0.00000 days | Status: CP
Start: 2023-08-06 — End: ?

## 2023-08-06 NOTE — Unmapped (Signed)
 Patient called to get her lab and echo results. Patient advised of lab results per Dr. Sedalia: Your potassium is little high but I would like to keep you on your current medications. Please avoid high potassium foods. Cindy Gonzalez Sedalia   Patient also wants to know the results of her Echo.  Advised that Echo has not been read.  Once it is read the results will be in her MyChart and/or we will contact her with the results.

## 2023-08-13 NOTE — Unmapped (Signed)
 Copied from CRM #[id number]. Topic: Scheduling - New Appointment  >> Aug 13, 2023  3:18 PM Cindy Gonzalez wrote:  Caller asked to schedule a new appointment    Blood sugar meds have changed, pt constipated and blood sugar not regulating as fast as it should. Pt has an eye appt in a few, would like a call back tomorrow. No appts with PCP, pt wanting Blood sugar checked.       Additional Requests/Needs: No

## 2023-08-20 ENCOUNTER — Encounter
Admit: 2023-08-20 | Discharge: 2023-08-20 | Payer: Medicare (Managed Care) | Attending: Student in an Organized Health Care Education/Training Program | Primary: Student in an Organized Health Care Education/Training Program

## 2023-08-20 DIAGNOSIS — Z9981 Dependence on supplemental oxygen: Principal | ICD-10-CM

## 2023-08-20 DIAGNOSIS — E871 Hypo-osmolality and hyponatremia: Principal | ICD-10-CM

## 2023-08-20 DIAGNOSIS — I5022 Chronic systolic (congestive) heart failure: Principal | ICD-10-CM

## 2023-08-20 DIAGNOSIS — E1165 Type 2 diabetes mellitus with hyperglycemia: Principal | ICD-10-CM

## 2023-08-20 DIAGNOSIS — N1831 CKD stage 3a, GFR 45-59 ml/min (CMS-HCC): Principal | ICD-10-CM

## 2023-08-20 DIAGNOSIS — I10 Essential (primary) hypertension: Principal | ICD-10-CM

## 2023-08-20 LAB — BASIC METABOLIC PANEL
ANION GAP: 11 mmol/L (ref 5–14)
BLOOD UREA NITROGEN: 35 mg/dL — ABNORMAL HIGH (ref 9–23)
BUN / CREAT RATIO: 24
CALCIUM: 10.4 mg/dL (ref 8.7–10.4)
CHLORIDE: 106 mmol/L (ref 98–107)
CO2: 23 mmol/L (ref 20.0–31.0)
CREATININE: 1.46 mg/dL — ABNORMAL HIGH (ref 0.55–1.02)
EGFR CKD-EPI (2021) FEMALE: 35 mL/min/1.73m2 — ABNORMAL LOW (ref >=60)
GLUCOSE RANDOM: 152 mg/dL — ABNORMAL HIGH (ref 70–99)
POTASSIUM: 5.5 mmol/L — ABNORMAL HIGH (ref 3.4–4.8)
SODIUM: 140 mmol/L (ref 135–145)

## 2023-08-20 MED ORDER — JANUMET XR 50 MG-500 MG TABLET,EXTENDED RELEASE
ORAL_TABLET | Freq: Every day | ORAL | 1 refills | 90.00000 days | Status: CP
Start: 2023-08-20 — End: 2024-02-16

## 2023-08-20 NOTE — Unmapped (Addendum)
-  Following closely with Dr. Gaston assistance in Cindy Gonzalez's care  -We briefly reviewed her last TTE and his recommendations that her echo was relatively unchanged from previously   -BP does appear better controlled with increasing dose of Losartan    -Will recheck electrolytes and renal function given increase dose of medication

## 2023-08-20 NOTE — Unmapped (Addendum)
-  Medications were changed after last visit in June 2025 as her A1C went from 7.2 to 8.1%  -She is tolerating Janumet  50-500mg  well; however notes her BG at home is still elevated  -BG this morning 171    -Will INCREASE Janumet  to 100-1000mg  every day  Orders:    POCT Glucose    sitagliptin phos-metFORMIN  (JANUMET  XR) 50-500 mg TM24; Take 2 tablets by mouth daily.    Basic Metabolic Panel  -rechecking BMP for monitoring of renal function as Metformin  will need to be stopped if GFR <30  -She has declined Jardiance  due to risk of infection   -Urine microalbumin and foot exam due in 10/2023  -Eye exam due in 03/2024

## 2023-08-20 NOTE — Unmapped (Addendum)
-  she reports using intermittent oxygen at night, noting could be secondary to bad dreams?   -Will refer to Pulmonology for further evaluation and management of supplemental oxygen   -Order placed for Oxygen supplies --> Concentrator supplies  Orders:    Ambulatory referral to Pulmonology; Future    Oxygen (Renewal Order) - HME; Future

## 2023-08-20 NOTE — Unmapped (Addendum)
-  Rechecking Renal function as Metformin  will need to stop if GFR <30  Orders:    Basic Metabolic Panel  -she does follow closely with nephrology as well

## 2023-08-20 NOTE — Unmapped (Signed)
 Patient ID: Cindy Gonzalez, date of birth [birth year] is a 83 y.o. female.    Date: August 21, 2023      Assessment & Plan  Type 2 diabetes mellitus with hyperglycemia, unspecified whether long term insulin use     -Medications were changed after last visit in June 2025 as her A1C went from 7.2 to 8.1%  -She is tolerating Janumet  50-500mg  well; however notes her BG at home is still elevated  -BG this morning 171    -Will INCREASE Janumet  to 100-1000mg  every day  Orders:    POCT Glucose    sitagliptin phos-metFORMIN  (JANUMET  XR) 50-500 mg TM24; Take 2 tablets by mouth daily.    Basic Metabolic Panel  -rechecking BMP for monitoring of renal function as Metformin  will need to be stopped if GFR <30  -She has declined Jardiance  due to risk of infection   -Urine microalbumin and foot exam due in 10/2023  -Eye exam due in 03/2024    Primary hypertension  Heart failure with mid-range ejection fraction     -Following closely with Dr. Gaston assistance in Cindy Gonzalez's care  -We briefly reviewed her last TTE and his recommendations that her echo was relatively unchanged from previously   -BP does appear better controlled with increasing dose of Losartan    -Will recheck electrolytes and renal function given increase dose of medication    On home oxygen therapy  -she reports using intermittent oxygen at night, noting could be secondary to bad dreams?   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 Check labs says she is fasting tool blood sugar this morning it was 167, declined Covid vaccine                 History of Present Illness  Cindy Gonzalez is an 83 year old female with diabetes who presents with concerns about her blood sugar control.    She reports that her current diabetes medication regimen, which includes metformin , has not resulted in a significant decrease in her blood sugar. She has not noticed a significant decrease in her blood sugar since starting the new medication. She takes the medication after breakfast around 9:30 AM, and occasionally earlier if she is hungry. No episodes of hypoglycemia or hyperglycemia have occurred, but she reports that her blood sugar has been a little higher than she thinks it should be. Her insurance covers the cost of the medication, and she recently switched her pharmacy from [facility] to [facility].    She experienced constipation initially after starting the new medication, which she managed by increasing her intake of salads, spinach, and other vegetables.  She cannot use Miralax  due to her kidney disease. The dietary changes have helped alleviate her constipation.    She has a history of kidney disease. She has not experienced any recent falls and reports that her urination is normal, although she occasionally experiences slight incontinence at night.    She uses oxygen occasionally, which was prescribed after an emergency room visit about a year ago when her oxygen levels were slightly low. She does not use it daily, only occasionally for about an hour, and has a machine that produces oxygen. She has a pulse oximeter at home and notes that her oxygen levels can drop to 92-93% but do not stay low. She has experienced bad dreams in the past, which she wonders might be related to oxygen levels at night.    She avoids foods high in potassium, such as orange juice and bananas, due to previously elevated potassium levels.      I have reviewed past medical, surgical, medication, allergy, social and family histories today and updated them in Epic where appropriate.     Allergies:   Allergies as of 08/20/2023 - Reviewed 08/20/2023   Allergen Reaction Noted    Amoxicillin   10/13/2021    Doxycycline  Other (See Comments) 02/08/2023    Cephalexin Palpitations 04/18/2011    Ciprofloxacin Palpitations 03/13/2018    Hydrocodone-acetaminophen  Nausea And Vomiting and Other (See Comments) 04/18/2011    Sulfamethoxazole Palpitations 03/13/2018       Problem List: Problem List[1]    The following information was reviewed by members of the visit team:  Allergies - Medical History - Surgical History -          Vitals:    08/20/23 0930   BP: 121/66   Pulse: 78   Temp: 36.4 ??C (97.6 ??F)   SpO2: 96%   Weight: 80.3 kg (177 lb)     Body mass index is 32.37 kg/m??.    Wt Readings from Last 3 Encounters:   08/20/23 80.3 kg (177 lb)   07/16/23 79.6 kg (175 lb 8 oz)   06/20/23 78.9 kg (174 lb)       ROS: ROS negative unless otherwise noted in HPI.    EXAM:   Physical Exam  Constitutional:       General: She is not in acute distress.     Appearance: Normal appearance. She is not ill-appearing, toxic-appearing or diaphoretic.   HENT:      Head: Normocephalic and atraumatic.      Nose: No congestion.      Mouth/Throat:      Mouth: Mucous membranes are moist.   Eyes:      General:         Right eye: No discharge.         Left eye: No discharge.      Extraocular Movements: Extraocular movements intact.   Pulmonary:      Effort: Pulmonary effort is normal. No respiratory distress.   Musculoskeletal:      Comments: Difficulty transitioning from sitting to standing. Using cane with ambulation. Gait is relatively stable with use of cane.    Neurological:      General: No focal deficit present.      Mental Status: She is alert and oriented to person, place, and time. Mental status is at baseline.      Cranial Nerves: No cranial nerve deficit.      Comments: Head tremor present   Psychiatric:  Mood and Affect: Mood normal.         Behavior: Behavior normal.                  [1]   Patient Active Problem List  Diagnosis    Pancreatitis (HHS-HCC)    Hepatitis    Hypertension    Coronary artery disease    Atelectasis    Heart failure with mid-range ejection fraction        Hypoxemia    Essential tremor    Hyponatremia    Acute disorder of liver    NICM (nonischemic cardiomyopathy)        Extranodal marginal zone B-cell lymphoma    Leukopenia    Diabetes mellitus        Dyslipidemia    History of non-Hodgkin's lymphoma    Arthritis    Seborrheic dermatitis    Insomnia    Obesity    Osteoarthritis    Mild aortic stenosis    Dryness of vagina    Skin carcinoma    Vertigo    History of pneumonia    History of constipation    Erythrocytosis    Iron deficiency anemia    Iron deficiency    Thrombocytopenia, unspecified    BMI 34.0-34.9,adult    History of vertigo    Polycythemia vera        Shortness of breath    Tachycardia    URI (upper respiratory infection)    Mild intermittent asthma without complication (HHS-HCC)    Anxiety disorder    Long term current use of aspirin     On home oxygen therapy

## 2023-08-21 ENCOUNTER — Ambulatory Visit
Admit: 2023-08-21 | Discharge: 2023-08-22 | Payer: Medicare (Managed Care) | Attending: Hematology & Oncology | Primary: Hematology & Oncology

## 2023-08-21 ENCOUNTER — Ambulatory Visit: Admit: 2023-08-21 | Discharge: 2023-08-22 | Payer: Medicare (Managed Care)

## 2023-08-21 ENCOUNTER — Other Ambulatory Visit: Admit: 2023-08-21 | Discharge: 2023-08-22 | Payer: Medicare (Managed Care)

## 2023-08-21 LAB — COMPREHENSIVE METABOLIC PANEL
ALBUMIN: 4.5 g/dL (ref 3.4–5.0)
ALKALINE PHOSPHATASE: 30 U/L — ABNORMAL LOW (ref 46–116)
ALT (SGPT): 30 U/L (ref 10–49)
ANION GAP: 13 mmol/L (ref 5–14)
AST (SGOT): 52 U/L — ABNORMAL HIGH (ref <=34)
BILIRUBIN TOTAL: 0.5 mg/dL (ref 0.3–1.2)
BLOOD UREA NITROGEN: 42 mg/dL — ABNORMAL HIGH (ref 9–23)
BUN / CREAT RATIO: 25
CALCIUM: 11.8 mg/dL — ABNORMAL HIGH (ref 8.7–10.4)
CHLORIDE: 103 mmol/L (ref 98–107)
CO2: 25 mmol/L (ref 20.0–31.0)
CREATININE: 1.68 mg/dL — ABNORMAL HIGH (ref 0.55–1.02)
EGFR CKD-EPI (2021) FEMALE: 30 mL/min/1.73m2 — ABNORMAL LOW (ref >=60)
GLUCOSE RANDOM: 145 mg/dL (ref 70–179)
POTASSIUM: 5.2 mmol/L — ABNORMAL HIGH (ref 3.4–4.8)
PROTEIN TOTAL: 7.9 g/dL (ref 5.7–8.2)
SODIUM: 141 mmol/L (ref 135–145)

## 2023-08-21 LAB — CBC W/ AUTO DIFF
BASOPHILS ABSOLUTE COUNT: 0 10*9/L (ref 0.0–0.1)
BASOPHILS RELATIVE PERCENT: 0.7 %
EOSINOPHILS ABSOLUTE COUNT: 0 10*9/L (ref 0.0–0.5)
EOSINOPHILS RELATIVE PERCENT: 0.1 %
HEMATOCRIT: 38.7 % (ref 34.0–44.0)
HEMOGLOBIN: 13.1 g/dL (ref 11.3–14.9)
LYMPHOCYTES ABSOLUTE COUNT: 0.9 10*9/L — ABNORMAL LOW (ref 1.1–3.6)
LYMPHOCYTES RELATIVE PERCENT: 37.6 %
MEAN CORPUSCULAR HEMOGLOBIN CONC: 33.9 g/dL (ref 32.0–36.0)
MEAN CORPUSCULAR HEMOGLOBIN: 38.1 pg — ABNORMAL HIGH (ref 25.9–32.4)
MEAN CORPUSCULAR VOLUME: 112.2 fL — ABNORMAL HIGH (ref 77.6–95.7)
MEAN PLATELET VOLUME: 8.1 fL (ref 6.8–10.7)
MONOCYTES ABSOLUTE COUNT: 0.3 10*9/L (ref 0.3–0.8)
MONOCYTES RELATIVE PERCENT: 11.5 %
NEUTROPHILS ABSOLUTE COUNT: 1.2 10*9/L — ABNORMAL LOW (ref 1.8–7.8)
NEUTROPHILS RELATIVE PERCENT: 50.1 %
PLATELET COUNT: 187 10*9/L (ref 150–450)
RED BLOOD CELL COUNT: 3.44 10*12/L — ABNORMAL LOW (ref 3.95–5.13)
RED CELL DISTRIBUTION WIDTH: 14.1 % (ref 12.2–15.2)
WBC ADJUSTED: 2.4 10*9/L — ABNORMAL LOW (ref 3.6–11.2)

## 2023-08-21 LAB — IONIZED CALCIUM VENOUS: CALCIUM IONIZED VENOUS (MG/DL): 5.71 mg/dL — ABNORMAL HIGH (ref 4.40–5.40)

## 2023-08-21 LAB — SLIDE REVIEW

## 2023-08-21 LAB — MONOCLONAL GAMMOPATHY CHEMISTRIES
GAMMAGLOBULIN; IGA: 252.7 mg/dL (ref 70.0–400.0)
GAMMAGLOBULIN; IGG: 1115 mg/dL (ref 650–1600)
GAMMAGLOBULIN; IGM: 137 mg/dL (ref 40–230)
PROTEIN TOTAL (SPECIAL CHEM): 8.1 g/dL (ref 5.7–8.2)

## 2023-08-21 LAB — LACTATE DEHYDROGENASE: LACTATE DEHYDROGENASE: 220 U/L (ref 120–246)

## 2023-08-21 LAB — PARATHYROID HORMONE (PTH): PARATHYROID HORMONE INTACT: 14.9 pg/mL — ABNORMAL LOW (ref 18.5–88.1)

## 2023-08-21 NOTE — Unmapped (Addendum)
 PLAN:  1) Hydrea : Take 2 tablets on Mon, Tues, and Wed; Take 1 tablet on Thur, Fri, Sat, and Sun  2) We will do blood work to try to determine your calcium  problem.    3) We will contact you next week with the results  4) I will go through you med list to see if there is a drug that causes it.      Is high calcium  a problem?  Yes -- people can become dehydrated, weak, and delirious.    You can have it without any symptoms.     Why do people have high calcium    Vitamin D: Your levels are fine.   Parathyroid adenoma:  This is a benign tumor of the parathyroid gland.  It tells your body that you need more calcium   Multiple Myeloma  Certain cancers can cause high calcium    Certain medicines can cause it as well.     All lab results last 24 hours:    Recent Results (from the past 24 hours)   Lactate dehydrogenase    Collection Time: 08/21/23  2:50 PM   Result Value Ref Range    LDH 220 120 - 246 U/L   Comprehensive Metabolic Panel    Collection Time: 08/21/23  2:50 PM   Result Value Ref Range    Sodium 141 135 - 145 mmol/L    Potassium 5.2 (H) 3.4 - 4.8 mmol/L    Chloride 103 98 - 107 mmol/L    CO2 25.0 20.0 - 31.0 mmol/L    Anion Gap 13 5 - 14 mmol/L    BUN 42 (H) 9 - 23 mg/dL    Creatinine 8.31 (H) 0.55 - 1.02 mg/dL    BUN/Creatinine Ratio 25     eGFR CKD-EPI (2021) Female 30 (L) >=60 mL/min/1.72m2    Glucose 145 70 - 179 mg/dL    Calcium  11.8 (H) 8.7 - 10.4 mg/dL    Albumin 4.5 3.4 - 5.0 g/dL    Total Protein 7.9 5.7 - 8.2 g/dL    Total Bilirubin 0.5 0.3 - 1.2 mg/dL    AST 52 (H) <=65 U/L    ALT 30 10 - 49 U/L    Alkaline Phosphatase 30 (L) 46 - 116 U/L   Ionized Calcium , Venous    Collection Time: 08/21/23  2:50 PM   Result Value Ref Range    Calcium , Ionized Venous 5.71 (H) 4.40 - 5.40 mg/dL It is not increased by a great deal - it is definitely higher than normal    CBC w/ Differential    Collection Time: 08/21/23  2:50 PM   Result Value Ref Range    WBC 2.4 (L) 3.6 - 11.2 10*9/L    RBC 3.44 (L) 3.95 - 5.13 10*12/L HGB 13.1 11.3 - 14.9 g/dL    HCT 61.2 65.9 - 55.9 %    MCV 112.2 (H) 77.6 - 95.7 fL    MCH 38.1 (H) 25.9 - 32.4 pg    MCHC 33.9 32.0 - 36.0 g/dL    RDW 85.8 87.7 - 84.7 %    MPV 8.1 6.8 - 10.7 fL    Platelet 187 150 - 450 10*9/L    Neutrophils % 50.1 %    Lymphocytes % 37.6 %    Monocytes % 11.5 %    Eosinophils % 0.1 %    Basophils % 0.7 %    Absolute Neutrophils 1.2 (L) 1.8 - 7.8 10*9/L    Absolute Lymphocytes 0.9 (L) 1.1 -  3.6 10*9/L    Absolute Monocytes 0.3 0.3 - 0.8 10*9/L    Absolute Eosinophils 0.0 0.0 - 0.5 10*9/L    Absolute Basophils 0.0 0.0 - 0.1 10*9/L    Macrocytosis Moderate (A) Not Present   Morphology Review    Collection Time: 08/21/23  2:50 PM   Result Value Ref Range    Smear Review Comments See Comment Undefined

## 2023-08-21 NOTE — Unmapped (Signed)
-  Following with Hematology, next visit on 08/21/2023

## 2023-08-21 NOTE — Unmapped (Addendum)
 ID:  Cindy Gonzalez is an 83 y.o. with PCV    DZ CHAR:   5.6/17.8/55.8/243; K 5.3; LDH: 248  Epo < 1  Smear: No immature forms; nl RBC morphology; smudge cells; reactive lymph   Ferritin: 27.5  LDH: 399  No h/o thrombosis.     Variants of Known/Likely Clinical Significance:   Gene Coding Predicted Protein Variant allele fraction   IDH2 c.419G>A p.(Arg140Gln) 26.7%   JAK2 c.1849G>T p.(Val617Phe) 69.8%   TET2 c.4045-2A>G p.(?) 13.4%      PROGNOSIS  As clonal hematopoiesis of indeterminate prognosis (CHIP)  Score Risk  5 year AML 10 year AML 10 year OS   13.00 High Risk 24.4 ?? 4.12% 52.2 ?? 4.96% 51.2 ?? 4.51%     As a myeloproliferative neoplasm (MPN)  5 yr OS: 66/9%  AML Risk: 19.8%    ASSESSMENT:   Cindy Gonzalez is an 83 y.o. w/ a h/o erythrocytosis.  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  A bone marrow biopsy is not needed to make the diagnosis though it may offer additional prognostic information.  Since this information will note change her clinical care, I have deferred.      She also has an IDH2 and TET2 mutation.  This conveys a worse prognosis as described by the Sanger model.  Her prognostic calculations are similar if this is considered CHIP or an MPN.      Despite this, Cindy. Gonzalez is doing well.  She continues to tolerate her current dose of hydroxyurea  and her HCT and platelets are within the target range.  Her ANC is slightly depressed, which warrants a slight decrease in her Hydrea .     She has worsening hypercalcemia today with a serum Ca 11.8 and ionized calcium  of 5.71. She is currently asymptomatic. The cause of this elevation is not clear.  We have ruled out hyperparathyroidism and multiple myeloma.  Previous measurement of Vitamin D have been WNL.  I have reviewed her medication list and none of these meds are associated with hypercalcemia.      We can look to more unusual causes.  She does not have hyper or hypotension making a pheo or adrenal cause unlikely.  Her PTH-RP is pending though this does not look like a paraneoplastic syndrome.  I have sent a TSH to rule out hyperthyroidism.  I have also reviewed her medication and I do not think any of her meds (except vitamin D) could be adding to the problem.      I am struck by the variation in her calcium  levels.  It is possible that her calcium  levels vary based on when she takes vitamin D.  However, that would be quite unusual.  I would be concerned about bony metastasis though she really does not have any symptoms suggestive of bony pain.    Her creatinine is elevated, which could be due to dehydration associated with hypercalcemia.  Her potassium is also elevated, which suggests we may need to hold her Losartan .        PLAN:  1) Lower Hydrea  to 5.0 g/wk   1 tablet BID on Mon - Wed   1 tablet every day on Thurs - Sun  2) Continue low dose aspirin   3) She should receive DVT prophylaxis if hospitalized.   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    04/2020: 3.2/13.7/41.1/225; ANC: 1.9; ALC: 1.0  02/2021: CTA: Negative; B12: 1,251  04/2021: TTE: EF 35-40%; 5.5/14.6/44.3/268; LDH: 234  10/14/21: 8.0/17.7/55.4/265; AMC: 0.1; ALC: 0.5; K: 5.0  10/19/21: JAK2: 64.9; Epo < 1  11/08/21: 5.6/17.8/55.8/243; K 5.3; LDH: 248  02/06/22: Seen by [facility]  7.8/19.5/59.7/262  Smear: No immature forms; nl RBC morphology; smudge cells; reactive lymph   Myeloid mutation panel: IDH2, JAK2, TET2  Ferritin: 27.5  LDH: 399  02/14/22: Started Hydroxyurea  3.5 g/wk  6.5/18.8/58.4/236; MCV: 84.1  02/23/22: Therapeutic phlebotomy 5.9/19.5/60.6/261  Ferritin: 25.3  02/26/22: 6.3/18.4/57.2/262; Hydrea  5.5 g/wk  03/09/22: Phlebotomy;   4.3/17.8/.55.4/153;  Ferritin: 18.3  03/17/22: 5.0/17.7/54.9/114; MCV: 87.5  04/10/22:Phlebotomy; 3.0/17.2/52.2/145; Hydrea  7 g/wk;   MCV: 90.0  ferritin: 265.5  06/12/22: 2.7/11.8/34.2/115; ANC 1.5: Hydrea  reduced 6 g/wk   07/24/22: 2.4/12.5/37.0/141, ANC 1.3; Hydrea  reduced to 5.5g/wk  09/18/22: 3.0/13.5/160; ANC: 1.8  12/19/22: 2.7/12.8/173  01/28/23: Admitted for shortness of breath; noted to have elevated BNP  03/13/23: 3.2/12.9/38.1/203  08/21/23: 2.4/13.1/38.7/187; Hydrea  reduced to 5 g a week.   History of Present Illness  The patient, with a history of heart failure with reduced ejection fraction, presents for follow-up after hospitalization for pneumonia and heart evaluation. She was brought to the emergency room by her son and daughter-in-law. She provided blood pressure records to her cardiologist, Dr. Velma Hutchinson, who is considering a medication change.    In general she is feeling well. She denies abdominal pain, early satiety, fatigue, pruritus, headache, vision changes, etc. She does report some tingling in the feet and leg cramping at night. She is taking hydroxyurea  as prescribed and tolerating well without any GI side effects.     She has a history of heart failure with a reduced ejection fraction, which was 35-40% in April 2023 and has decreased to 30-35% as of January 2025. She was previously on lisinopril  but was switched to losartan  to strengthen her heart muscle. She takes three Lasix  pills a week to manage fluid retention and monitors her weight regularly. No significant swelling is reported. She is on a low sodium diet and monitors her weight daily.     She is also taking vitamin D3, but the current dosage may be excessive, contributing to slightly elevated calcium  levels.     MPN 10 Score  (App: [email or web address]) [email or web address]  (Useful for monitoring patient symptoms.    Symptom Range Pre-Tx 03/13/23 08/21/23   Fatigue in the past 24 hours (Absent) 0 1 2 3 4 5 6 7 8 9  10 (Worst Imaginable) 0 0 1   Filling up quickly when you eat (Early satiety)  (Absent) 0 1 2 3 4 5 6 7 8 9  10 (Worst Imaginable) 0 0 0   Abdominal discomfort  (Absent) 0 1 2 3 4 5 6 7 8 9  10 (Worst Imaginable) 0 0 0   Inactivity (Absent) 0 1 2 3 4 5 6 7 8 9  10 (Worst Imaginable)  1 2-3 0   Problems with concentration - (Absent) 0 1 2 3 4 5 6 7 8 9  10 (Worst  Imaginable) 0 0 0   Numbness/ Tingling (in my hands and feet) (Absent) 0 1 2 3 4 5 6 7 8 9  10 (Worst Imaginable) 1 - 2 2 1    Night sweats (Absent) 0 1 2 3 4 5 6 7 8 9  10 (Worst Imaginable)  1 0 0   Itching (pruritus) (Absent) 0 1 2 3 4 5 6 7 8 9  10 (Worst Imaginable) 1 0 0   Bone pain (diffuse not joint pain or arthritis) (Absent) 0 1 2 3 4 5 6 7 8 9  10 (Worst Imaginable) 1 - 3  2 0   Fever (>100 F) (Absent) 0 1 2 3 4 5 6 7 8 9  10 (Daily) 0 0 0   Unintentional weight loss last 6 months (Absent) 0 1 2 3 4 5 6 7 8 9  10 (Worst Imaginable) With DM meds lost 16 lbs  0 0     5 7 2      PHYSICAL EXAM:  VS: As recorded in Epic  GENERAL:  NAD; she is able to get to the table with minimal assistance  HEENT: OP is clear; C/S clear  LYMPH NODES: No LAN  NECK: No JVD/HJR  LUNGS: Dim   COR: 2/6 sys m; + S4; rrr  ABD:  NTND; no HSM   EXT: No edema    PMHx of pancreatitis, hepatitis, T2DM, atelectasis, acute respiratory failure with hypoxemia, nonischemic cardiomyopathy, HTN, aortic stenosis, heart failure, CAD, essential tremor, osteoarthritis, vertigo, iron deficiency anemia, insomnia, dyslipidemia, Diffuse large B cell lymphoma.              Primary prophylaxis of thrombosis  All patients: Aspirin  unless    VWF activity <30%,   Platelet >?1 million,   CALR-mutated low-risk ET    PV with Hct >?45%: Add phlebotomy/cytoreduction to target Hct <?45%  Age >?60 years and/or prior history of thrombosis: Add cytoreduction  Secondary prophylaxis after thrombotic event  All patients: Cytoreduction  Typical VTE: Consider indefinite VKA for most patients; aspirin  if not on VKA  Atypical VTE: Indefinite VKA  Arterial thrombosis: Aspirin     PCV Risk  Age > 60:    1   No: 0   Yes: 1    History of thrombosis:  0   No: 0   Yes: 1    Low Risk: 0 points  High Risk 1 -2 points    Low Risk patients treat with aspirin  every day;   High Risk patients treat with aspirin  BID*  * Consider BID aspirin  for patients with microvascular complications, additional CV risk factors, or leukocytosis    PV:   Major criteria  Hgb/Hct  Men: >?16.5?g/dL or 50% or inc red blood cell mass  Women: > 16 g/dL or 51% or inc red blood cell mass  Inc in RBC Mass > 25%  Presence of JAK2 or JAK2 exon 12 mutation   BM biopsy showing hypercellularity for age with trilineage growth (panmyelosis) including prominent erythroid, granulocytic and megakaryocytic proliferation with pleomorphic, mature megakaryocytes (differences in size, without atypia)     Minor criteria  Subnormal serum erythropoietin level     Diagnosis: All 3 major or first 2 major and 1 minor

## 2023-08-22 DIAGNOSIS — N1831 CKD stage 3a, GFR 45-59 ml/min (CMS-HCC): Principal | ICD-10-CM

## 2023-08-22 NOTE — Unmapped (Signed)
 Contacted patient at primary number of [phone]  She confirmed her identity before discussing     We discussed that her kidney function has declined again which is concerning.   I advised to decrease her losartan  in half as this appears to be the only medication change recently that could have effected her kidney function.     I advised to decrease to 25mg  every day and will recheck renal function early next week.   I advised if GFR continues to drop (<30), then we will need to stop metformin .     She had no further questions/concerns. Message sent to [employer] for LAB VISIT     CKD stage 3a, GFR 45-59 ml/min (CMS-HCC)  -     Basic Metabolic Panel; Future

## 2023-08-23 LAB — MONOCLONAL GAMMOPATHY WORKUP, SERUM
ALBUMIN (SPE): 4.9 g/dL (ref 3.5–5.0)
ALPHA-1 GLOBULIN: 0.3 g/dL (ref 0.2–0.5)
ALPHA-2 GLOBULIN: 0.8 g/dL (ref 0.5–1.1)
BETA-1 GLOBULIN: 0.6 g/dL (ref 0.3–0.6)
BETA-2 GLOBULIN: 0.4 g/dL (ref 0.2–0.6)
GAMMAGLOBULIN: 1.1 g/dL (ref 0.5–1.5)
PROTEIN TOTAL (SPECIAL CHEM): 8.1 g/dL

## 2023-08-23 LAB — SERUM FREE LIGHT CHAINS
K/L FLC RATIO: 1.21 (ref 0.26–1.65)
KAPPA FREE,SERUM: 4.06 mg/dL — ABNORMAL HIGH (ref 0.33–1.94)
LAMBDA FREE, SER: 3.35 mg/dL — ABNORMAL HIGH (ref 0.57–2.63)

## 2023-08-23 LAB — TSH: THYROID STIMULATING HORMONE: 2.823 u[IU]/mL (ref 0.550–4.780)

## 2023-08-23 LAB — VITAMIN D 25 HYDROXY: VITAMIN D, TOTAL (25OH): 22.3 ng/mL (ref 20.0–80.0)

## 2023-08-24 NOTE — Unmapped (Signed)
 I am somewhat concerned about Ms Rask's calcium  and renal function.  I also admit that I am not sure why there has been so much change in a relatively short period.  I have suggested the following:     PLAN:  1) Lower Hydrea  to 5.0 g/wk   1 tablet BID on Mon - Wed   1 tablet every day on Thurs - Sun  This is a drop in Hydrea  by 1 tablet a week.  The change is small; the goal is not to suppress her ANC further. Overall, she is doing fine from a PCV standpoint.   2) Continue low dose aspirin   3) She should receive DVT prophylaxis if hospitalized.   4) FU on PTH-RP  5) Stop Vitamin D (since it may be confusing the picture)  Wyvonna, I would call her and ask her to stop her vitamin D   6) Repeat labs next week (CMP)   Jen, you will need to arrange this as well - thanks  7) Consider PET CT scanning, referral to endocrine or nephrology  Dr Keven, I will leave this decision with you as well as further follow-up.  I can only say with confidence that this problem is not related to her hematologic issues. My assessment of her hypercalcemia is given below.     She has worsening hypercalcemia today with a serum Ca 11.8 and ionized calcium  of 5.71. She is currently asymptomatic. The cause of this elevation is not clear.  We have ruled out hyperparathyroidism and multiple myeloma.  Previous measurement of Vitamin D have been WNL.  I have reviewed her medication list and none of these meds are associated with hypercalcemia.       We can look to more unusual causes.  She does not have hyper or hypotension making a pheo or adrenal cause unlikely.  Her PTH-RP is pending though this does not look like a paraneoplastic syndrome.  I have sent a TSH to rule out hyperthyroidism.  I have also reviewed her medication and I do not think any of her meds (except vitamin D) could be adding to the problem.       I am struck by the variation in her calcium  levels.  It is possible that her calcium  levels vary based on when she takes vitamin D. However, that would be quite unusual.  I would be concerned about bony metastasis though she really does not have any symptoms suggestive of bony pain.     Her creatinine is elevated, which could be due to dehydration associated with hypercalcemia.  Her potassium is also elevated, which suggests we may need to hold her Losartan .

## 2023-08-25 NOTE — Unmapped (Signed)
 Good Evening Dr. Fleeta Ebbing,   Thank you for your message. I agree with your concerns---I have added Dr. Mottl, who evaluated Ms. Cattell in April 2025.     I do have a BMP order placed for us  to repeat her kidney function next week.     Dr. Mottl, should I place another referral to you for Ms. Zufall to be re-evaluated?

## 2023-08-26 ENCOUNTER — Encounter
Admit: 2023-08-26 | Discharge: 2023-08-26 | Payer: Medicare (Managed Care) | Attending: Student in an Organized Health Care Education/Training Program | Primary: Student in an Organized Health Care Education/Training Program

## 2023-08-26 DIAGNOSIS — N1831 CKD stage 3a, GFR 45-59 ml/min (CMS-HCC): Principal | ICD-10-CM

## 2023-08-26 LAB — BASIC METABOLIC PANEL
ANION GAP: 12 mmol/L (ref 5–14)
BLOOD UREA NITROGEN: 27 mg/dL — ABNORMAL HIGH (ref 9–23)
BUN / CREAT RATIO: 22
CALCIUM: 10 mg/dL (ref 8.7–10.4)
CHLORIDE: 108 mmol/L — ABNORMAL HIGH (ref 98–107)
CO2: 24 mmol/L (ref 20.0–31.0)
CREATININE: 1.25 mg/dL — ABNORMAL HIGH (ref 0.55–1.02)
EGFR CKD-EPI (2021) FEMALE: 42 mL/min/1.73m2 — ABNORMAL LOW (ref >=60)
GLUCOSE RANDOM: 161 mg/dL (ref 70–179)
POTASSIUM: 5.9 mmol/L — ABNORMAL HIGH (ref 3.4–4.8)
SODIUM: 144 mmol/L (ref 135–145)

## 2023-08-27 NOTE — Unmapped (Signed)
 Good Morning, I am very sorry--I was not able to add on the Vitamin D (I was out of the office yesterday). Her renal function did improve but her hyperkalemia is still persisting. Is there still a way she could still see you for an evaluation?

## 2023-08-28 DIAGNOSIS — E875 Hyperkalemia: Principal | ICD-10-CM

## 2023-08-28 LAB — PTH RELATED PEPTIDE: PTH-RELATED PEPTIDE: 1.8 pmol/L

## 2023-08-28 NOTE — Unmapped (Signed)
 Yes, absolutely. I will get her scheduled for next week. Thank you for your help!

## 2023-08-29 NOTE — Unmapped (Signed)
 Copied from CRM #[id number]. Topic: Access To Clinicians - Req Clinic Call Back  >> Aug 29, 2023  8:32 AM Tillman HERO wrote:  Reason of the Call: Patient called in stating she had a missed call from someone at the clinic. Believes it may be for her recent labs. Please follow up when possible.      The patient preferred contact: Cell Phone Telephone Information:  Mobile          [phone]    Routine callback turnaround time: 24-48 business hours. Programmer, systems Notified)

## 2023-09-05 DIAGNOSIS — D751 Secondary polycythemia: Principal | ICD-10-CM

## 2023-09-05 MED ORDER — HYDROXYUREA 500 MG CAPSULE
ORAL_CAPSULE | 11 refills | 0.00000 days
Start: 2023-09-05 — End: ?

## 2023-09-05 NOTE — Unmapped (Signed)
 Please refill if appropriate.     Most recent clinic visit: 08/21/2023  Next clinic visit: 09/27/2023

## 2023-09-05 NOTE — Unmapped (Signed)
 [facility] Refill Coordination Note    Specialty Lite Medication(s) to be Shipped:   hydroxyurea     Other medication(s) to be shipped: No additional medications requested for fill at this time     Cindy Gonzalez, DOB: June 23, 1940  Phone: [phone] (home)       All above HIPAA information was verified with patient.     Was a Nurse, learning disability used for this call? No    Changes to medications: [name] reports no changes at this time.  Changes to insurance: No      REFERRAL TO PHARMACIST     Referral to the pharmacist: Yes - pt reports she has been instructed to take 1 capsule BID Monday - Weds, and 1 capsule QD from Thurs - Sat. Will request new script       SHIPPING     Shipping address confirmed in Epic.     Cost and Payment: Patient has a $0 copay, payment information is not required.    Delivery Scheduled: Yes, Expected medication delivery date: 09/06/23.     Medication will be delivered via Same Day Courier to the prescription address in Epic WAM.    Vernell Galloway, PharmD   [facility] Specialty Pharmacist

## 2023-09-06 ENCOUNTER — Encounter
Admit: 2023-09-06 | Discharge: 2023-09-06 | Payer: Medicare (Managed Care) | Attending: Student in an Organized Health Care Education/Training Program | Primary: Student in an Organized Health Care Education/Training Program

## 2023-09-06 DIAGNOSIS — E875 Hyperkalemia: Principal | ICD-10-CM

## 2023-09-06 LAB — BASIC METABOLIC PANEL
ANION GAP: 12 mmol/L (ref 5–14)
BLOOD UREA NITROGEN: 29 mg/dL — ABNORMAL HIGH (ref 9–23)
BUN / CREAT RATIO: 25
CALCIUM: 9.3 mg/dL (ref 8.7–10.4)
CHLORIDE: 109 mmol/L — ABNORMAL HIGH (ref 98–107)
CO2: 22 mmol/L (ref 20.0–31.0)
CREATININE: 1.17 mg/dL — ABNORMAL HIGH (ref 0.55–1.02)
EGFR CKD-EPI (2021) FEMALE: 46 mL/min/1.73m2 — ABNORMAL LOW (ref >=60)
GLUCOSE RANDOM: 108 mg/dL (ref 70–179)
POTASSIUM: 4.5 mmol/L (ref 3.4–4.8)
SODIUM: 143 mmol/L (ref 135–145)

## 2023-09-06 MED ORDER — HYDROXYUREA 500 MG CAPSULE
ORAL_CAPSULE | ORAL | 11 refills | 0.00000 days | Status: CP
Start: 2023-09-06 — End: ?
  Filled 2023-09-09: qty 142, 90d supply, fill #0

## 2023-09-12 NOTE — Unmapped (Signed)
 Left message to return my call, called daughter gave results to daughter per Dr Keven     Leighton Dage, CMA

## 2023-09-12 NOTE — Unmapped (Signed)
-----   Message from Elaine, DO sent at 09/12/2023  8:42 AM EDT -----  Please contact patient with the following:   -your potassium is back in normal range and your kidney function did improve  -please continue HALF of the losartan  dose at this time  -we will recheck this lab next visit.     Thank you   ----- Message -----  From: Lab, Background User  Sent: 09/06/2023   8:45 PM EDT  To: [name] Mangel, DO

## 2023-09-23 DIAGNOSIS — C884 Extranodal marginal zone B-cell lymphoma: Principal | ICD-10-CM

## 2023-09-23 NOTE — Unmapped (Signed)
 [name] was contacted today regarding adding a lab on 9/26 prior to Clinic visit with Duwaine Nixon. Voicemail was left for patient with information to call back.

## 2023-09-26 NOTE — Unmapped (Unsigned)
 Copied from CRM #[id number]. Topic: Return Scheduling - Cancel/Reschedule  >> Sep 26, 2023  2:11 PM Gwendlyn FALCON wrote:      [name] contacted the Communication Center to reschedule their appointment for tomorrow.  The original appointment has been cancelled.    Cancellation Reason: schedule conflict    Patient has been rescheduled for 01/31/24.    Thank you,  Gwendlyn Janne Coup  [facility]   [phone]

## 2023-09-30 NOTE — Unmapped (Signed)
 I spoke with [name] to confirm 03DEC2025 appointments for the following:  [facility] @ 1330  [facility] @1430   [facility] @1500      Lona Pacini

## 2023-10-11 ENCOUNTER — Encounter: Admit: 2023-10-11 | Discharge: 2023-10-11 | Payer: Medicare (Managed Care)

## 2023-10-11 ENCOUNTER — Encounter
Admit: 2023-10-11 | Discharge: 2023-10-11 | Payer: Medicare (Managed Care) | Attending: Student in an Organized Health Care Education/Training Program | Primary: Student in an Organized Health Care Education/Training Program

## 2023-10-11 DIAGNOSIS — E785 Hyperlipidemia, unspecified: Principal | ICD-10-CM

## 2023-10-11 DIAGNOSIS — G250 Essential tremor: Principal | ICD-10-CM

## 2023-10-11 DIAGNOSIS — R109 Unspecified abdominal pain: Principal | ICD-10-CM

## 2023-10-11 DIAGNOSIS — I10 Essential (primary) hypertension: Principal | ICD-10-CM

## 2023-10-11 DIAGNOSIS — J4520 Mild intermittent asthma, uncomplicated: Principal | ICD-10-CM

## 2023-10-11 DIAGNOSIS — E1165 Type 2 diabetes mellitus with hyperglycemia: Principal | ICD-10-CM

## 2023-10-11 DIAGNOSIS — C884 Extranodal marginal zone B-cell lymphoma (CMS-HCC): Principal | ICD-10-CM

## 2023-10-11 LAB — HEMOGLOBIN A1C
ESTIMATED AVERAGE GLUCOSE: 146 mg/dL
HEMOGLOBIN A1C: 6.7 % — ABNORMAL HIGH (ref 4.8–5.6)

## 2023-10-11 LAB — LACTATE DEHYDROGENASE: LACTATE DEHYDROGENASE: 277 U/L — ABNORMAL HIGH (ref 120–246)

## 2023-10-11 LAB — CBC W/ AUTO DIFF
BASOPHILS ABSOLUTE COUNT: 0 10*9/L (ref 0.0–0.1)
BASOPHILS RELATIVE PERCENT: 1 %
EOSINOPHILS ABSOLUTE COUNT: 0 10*9/L (ref 0.0–0.5)
EOSINOPHILS RELATIVE PERCENT: 0.1 %
HEMATOCRIT: 39.4 % (ref 34.0–44.0)
HEMOGLOBIN: 12.7 g/dL (ref 11.3–14.9)
LYMPHOCYTES ABSOLUTE COUNT: 0.7 10*9/L — ABNORMAL LOW (ref 1.1–3.6)
LYMPHOCYTES RELATIVE PERCENT: 28.1 %
MEAN CORPUSCULAR HEMOGLOBIN CONC: 32.4 g/dL (ref 32.0–36.0)
MEAN CORPUSCULAR HEMOGLOBIN: 36.5 pg — ABNORMAL HIGH (ref 25.9–32.4)
MEAN CORPUSCULAR VOLUME: 112.8 fL — ABNORMAL HIGH (ref 77.6–95.7)
MEAN PLATELET VOLUME: 9.1 fL (ref 6.8–10.7)
MONOCYTES ABSOLUTE COUNT: 0.2 10*9/L — ABNORMAL LOW (ref 0.3–0.8)
MONOCYTES RELATIVE PERCENT: 9.7 %
NEUTROPHILS ABSOLUTE COUNT: 1.5 10*9/L — ABNORMAL LOW (ref 1.8–7.8)
NEUTROPHILS RELATIVE PERCENT: 61.1 %
PLATELET COUNT: 140 10*9/L — ABNORMAL LOW (ref 150–450)
RED BLOOD CELL COUNT: 3.49 10*12/L — ABNORMAL LOW (ref 3.95–5.13)
RED CELL DISTRIBUTION WIDTH: 13.7 % (ref 12.2–15.2)
WBC ADJUSTED: 2.4 10*9/L — ABNORMAL LOW (ref 3.6–11.2)

## 2023-10-11 LAB — COMPREHENSIVE METABOLIC PANEL
ALBUMIN: 4.3 g/dL (ref 3.4–5.0)
ALKALINE PHOSPHATASE: 38 U/L — ABNORMAL LOW (ref 46–116)
ALT (SGPT): 53 U/L — ABNORMAL HIGH (ref 10–49)
ANION GAP: 13 mmol/L (ref 5–14)
AST (SGOT): 58 U/L — ABNORMAL HIGH (ref <=34)
BILIRUBIN TOTAL: 0.6 mg/dL (ref 0.3–1.2)
BLOOD UREA NITROGEN: 26 mg/dL — ABNORMAL HIGH (ref 9–23)
BUN / CREAT RATIO: 23
CALCIUM: 10.1 mg/dL (ref 8.7–10.4)
CHLORIDE: 106 mmol/L (ref 98–107)
CO2: 24 mmol/L (ref 20.0–31.0)
CREATININE: 1.14 mg/dL — ABNORMAL HIGH (ref 0.55–1.02)
EGFR CKD-EPI (2021) FEMALE: 47 mL/min/1.73m2 — ABNORMAL LOW (ref >=60)
GLUCOSE RANDOM: 130 mg/dL — ABNORMAL HIGH (ref 70–99)
POTASSIUM: 4.9 mmol/L — ABNORMAL HIGH (ref 3.4–4.8)
PROTEIN TOTAL: 7.7 g/dL (ref 5.7–8.2)
SODIUM: 143 mmol/L (ref 135–145)

## 2023-10-11 LAB — LIPID PANEL
CHOLESTEROL: 163 mg/dL (ref <200)
HDL CHOLESTEROL: 34 mg/dL — ABNORMAL LOW (ref >50)
LDL CHOLESTEROL CALCULATED: 86 mg/dL (ref <100)
NON-HDL CHOLESTEROL: 129 mg/dL (ref <130)
TRIGLYCERIDES: 280 mg/dL — ABNORMAL HIGH (ref <150)

## 2023-10-11 NOTE — Unmapped (Addendum)
-  stable at this time  -She does take Lorazepam  PRN   -Previously tried Propranolol but developed sedation and stopped  -PDMP was reviewed and is appropriate.     -Medication list includes Hydroxyzine ; however unsure if she is taking this. Given this, will remove from medication list

## 2023-10-11 NOTE — Unmapped (Signed)
 Patient ID: Cindy Gonzalez, date of birth 06-15-1940 is a 83 y.o. female.    Date: October 12, 2023      Cindy Gonzalez I a 83 y.o. female presents for follow up  Assessment & Plan  Abdominal pain, unspecified abdominal location  -she reports intermittent abdominal pain and diarrhea but thought to have been from diet (eating a lot of spinach)   -her symptoms have improved with the use of Pepto-Bismol   -We discussed differentials including viral gastroenteritis vs inflammation or diverticulitis. However her symptoms are improving   -We will closely monitor and obtain labs below.   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)  Dyslipidemia  -Following closely with Cardiology  -In August, we had to DECREASE her Losartan  50mg  --> 25mg  due to hyperkalemia and AKI    BP Readings from Last 3 Encounters:   10/11/23 133/79   08/21/23 126/82   08/20/23 121/66     -Her BP is stable at this time  -Continue:    -Losartan  25mg  every day    -Metoprolol  succinate 50mg  at bedtime    -Spironolactone  25mg  every day    -Aspirin  81mg  every day   -Atorvastatin  10mg  every day    -Fenofibrate  145mg  every day    -Lasix  20mg  three times a week    -Labs below  Orders:    Lipid Panel    Essential tremor  Insomnia, unspecified type  -stable at this time  -She does take Lorazepam  PRN   -Previously tried Propranolol but developed sedation and stopped  -PDMP was reviewed and is appropriate.     -Medication list includes Hydroxyzine ; however unsure if she is taking this. Given this, will remove from medication list       Mild intermittent asthma without complication (HHS-HCC)  -okay to use Albuterol  PRN       Type 2 diabetes mellitus with hyperglycemia, unspecified whether long term insulin use (CMS-HCC)  -Medications were changed after last visit in June 2025 as her A1C went from 7.2 to 8.1%   -She has been taking Janumet  100-1000mg  every day   -Rechecking A1C.   -Given Age, A1C goal 7.5%-8.0%  Orders:    Hemoglobin A1c    -Urine Microalbumin due in 10/2023  -Foot exam due in 10/2024  -Eye exam due in 03/2024    -If diabetes remains controlled, will hope to STOP Janumet  and change to Januvia    Extranodal marginal zone B-cell lymphoma (CMS-HCC)  Thrombocytopenia, unspecified  Erythrocytosis  -Following with Heme/Onc, Last visit on 08/21/2023:     ASSESSMENT:  Cindy Gonzalez is an 83 y.o. w/ a h/o erythrocytosis.  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  A bone marrow biopsy is not needed to make the diagnosis though it may offer additional prognostic information.  Since this information will note change her clinical care, I have deferred.       She also has an IDH2 and TET2 mutation.  This conveys  a worse prognosis as described by the Sanger model.  Her prognostic calculations are similar if this is considered CHIP or an MPN.       Despite this, Cindy Gonzalez is doing well.  She continues to tolerate her current dose of hydroxyurea  and her HCT and platelets are within the target range.  Her ANC is slightly depressed, which warrants a slight decrease in her Hydrea .      She has worsening hypercalcemia today with a serum Ca 11.8 and ionized calcium  of 5.71. She is currently asymptomatic. The cause of this elevation is not clear.  We have ruled out hyperparathyroidism and multiple myeloma.  Previous measurement of Vitamin D have been WNL.  I have reviewed her medication list and none of these meds are associated with hypercalcemia.       We can look to more unusual causes.  She does not have hyper or hypotension making a pheo or adrenal cause unlikely.  Her PTH-RP is pending though this does not look like a paraneoplastic syndrome.  I have sent a TSH to rule out hyperthyroidism.  I have also reviewed her medication and I do not think any of her meds (except vitamin D) could be adding to the problem.       I am struck by the variation in her calcium  levels.  It is possible that her calcium  levels vary based on when she takes vitamin D.  However, that would be quite unusual.  I would be concerned about bony metastasis though she really does not have any symptoms suggestive of bony pain.     Her creatinine is elevated, which could be due to dehydration associated with hypercalcemia.  Her potassium is also elevated, which suggests we may need to hold her Losartan .  PLAN:  1) Lower Hydrea  to 5.0 g/wk   1 tablet BID on Mon - Wed   1 tablet every day on Thurs - Sun  2) Continue low dose aspirin   3) She should receive DVT prophylaxis if hospitalized.   4) FU on PTH-RP  5) Stop Vitamin D (since it may be confusing the picture)  6) Repeat labs next week (CMP)   7) Consider PET CT scanning   8) RTC in 3 months    Orders:    Lactate dehydrogenase    Comprehensive Metabolic Panel    CBC w/ Differential    On home oxygen therapy  -She previously noted to have used supplement oxygen intermittently at night  -She is scheduled with Pulmonology in November 2025 for management of her supplemental oxygen       Immunization due  -Received the flu shot without difficulty.   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     -However in August, she did develop hyperkalemia and AKI. I appreciate Dr. Powell assistance in Cindy Gonzalez's care.   -We will recheck CMP to re-assess renal function and electrolytes       Atrophic vaginitis  -okay to continue to use Topical Estrace  Vaginal cream if not having ADE/SE       Osteoarthritis, unspecified osteoarthritis type, unspecified site  -previously prescribed Tramadol  for severe pain   -Would recommend not to take NSAIDs at this time  due to renal function and electrolyte abnormalities         HEALTH MAINTENANCE   -DEXA due in 03/2024  -Continue to follow with Dermatology   -If she wishes, I would recommend to repeat Mammogram (appears was due in 03/2023)      Return in about 3 months (around 01/13/2024) for chronic care follow up.    Chief Complaint:   Chief Complaint   Patient presents with    Follow-up     Having some lower abdominal pain after eating             History of Present Illness  ANNALISIA INGBER is an 83 year old female who presents with abdominal pain and diarrhea.    She has been experiencing abdominal pain and diarrhea for the past three to four days, and reports that she ate a large amount of spinach four to five days ago. The pain is described as soreness and irritation in the stomach, with discomfort moving to different areas of the abdomen. Diarrhea has slowed down after taking Pepto-Bismol, but occasional episodes persist, sometimes unrelated to eating. No blood in stool is noted, and the color returned to normal after stopping Pepto-Bismol. She experienced slight nausea once or twice but did not feel like vomiting. Similar abdominal pain has occurred intermittently throughout her life, but it was particularly severe recently.    She is on medication for diabetes, taking a 'sugar pill' twice daily. She noticed an undissolved pill in her stool recently, which she found unusual. Her blood sugar was 130 mg/dL in the morning, and she mentions having a cookie the previous night. She does not feel her blood sugar has been too high or too low recently.    She follows dietary restrictions due to high potassium levels, avoiding bananas and oranges as advised by her heart doctor. She occasionally consumes fried bread, macaroni, ramen noodles, and chicken noodle soup, being mindful of her sodium intake. She has not had any recent falls and uses oxygen occasionally, though she did not feel the need for it today.        Allergies:   Allergies as of 10/11/2023 - Reviewed 10/11/2023   Allergen Reaction Noted    Amoxicillin   10/13/2021    Doxycycline  Other (See Comments) 02/08/2023    Cephalexin Palpitations 04/18/2011    Ciprofloxacin Palpitations 03/13/2018    Hydrocodone-acetaminophen  Nausea And Vomiting and Other (See Comments) 04/18/2011    Sulfamethoxazole Palpitations 03/13/2018       Problem List: Problem List[1]    The following information was reviewed by members of the visit team:  Allergies - Medications - Medical History - Surgical History - Family   History -          Vitals:    10/11/23 0945   BP: 133/79   Pulse: 79   Temp: 36.6 ??C (97.9 ??F)   SpO2: 96%   Weight: 79.4 kg (175 lb)   Height: (P) 157.5 cm (5' 2)     Body mass index is 32.01 kg/m?? (pended).    Wt Readings from Last 3 Encounters:   10/11/23 79.4 kg (175 lb)   08/21/23 80.2 kg (176 lb 12.9 oz)   08/20/23 80.3 kg (177 lb)       ROS: ROS negative unless otherwise noted in HPI.    EXAM:   Physical Exam  Constitutional:       General: She is not in acute distress.     Appearance: She is not  toxic-appearing or diaphoretic.   HENT:      Head: Normocephalic and atraumatic.      Nose: No congestion.      Mouth/Throat:      Mouth: Mucous membranes are moist.   Eyes:      General:         Right eye: No discharge.      Extraocular Movements: Extraocular movements intact.   Cardiovascular:      Rate and Rhythm: Normal rate and regular rhythm.      Heart sounds: Murmur heard.   Pulmonary:      Effort: No respiratory distress.   Abdominal:      Palpations: Abdomen is soft.      Tenderness: There is no guarding or rebound.   Neurological:      General: No focal deficit present.      Mental Status: She is oriented to person, place, and time. Mental status is at baseline.      Gait: Gait abnormal.      Comments: Head tremor present   Psychiatric:         Mood and Affect: Mood normal.         Behavior: Behavior normal.       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)    Hypoxemia    Essential tremor    Hyponatremia    Acute disorder of liver    NICM (nonischemic cardiomyopathy)    (CMS-HCC)    Extranodal marginal zone B-cell lymphoma (CMS-HCC)    Leukopenia    Diabetes mellitus (CMS-HCC)    Dyslipidemia    History of non-Hodgkin's lymphoma    Arthritis    Seborrheic dermatitis    Insomnia    Obesity    Osteoarthritis    Mild aortic stenosis    Dryness of vagina    Skin carcinoma    Vertigo    History of pneumonia    History of constipation    Erythrocytosis    Iron deficiency anemia    Iron deficiency    Thrombocytopenia, unspecified    BMI 34.0-34.9,adult    History of vertigo    Polycythemia vera (CMS-HCC)    Shortness of breath    Tachycardia    URI (upper respiratory infection)    Mild intermittent asthma without complication (HHS-HCC)    Anxiety disorder    Long term current use of aspirin     On home oxygen therapy

## 2023-10-11 NOTE — Unmapped (Addendum)
-  Following closely with Cardiology  -In August, we had to DECREASE her Losartan  50mg  --> 25mg  due to hyperkalemia and AKI    BP Readings from Last 3 Encounters:   10/11/23 133/79   08/21/23 126/82   08/20/23 121/66     -Her BP is stable at this time  -Continue:    -Losartan  25mg  every day    -Metoprolol  succinate 50mg  at bedtime    -Spironolactone  25mg  every day    -Aspirin  81mg  every day   -Atorvastatin  10mg  every day    -Fenofibrate  145mg  every day    -Lasix  20mg  three times a week    -Labs below  Orders:    Lipid Panel

## 2023-10-11 NOTE — Unmapped (Addendum)
-  Medications were changed after last visit in June 2025 as her A1C went from 7.2 to 8.1%   -She has been taking Janumet  100-1000mg  every day   -Rechecking A1C.   -Given Age, A1C goal 7.5%-8.0%  Orders:    Hemoglobin A1c    -Urine Microalbumin due in 10/2023  -Foot exam due in 10/2024  -Eye exam due in 03/2024    -If diabetes remains controlled, will hope to STOP Janumet  and change to Januvia

## 2023-10-11 NOTE — Unmapped (Addendum)
-  okay to use Albuterol  PRN

## 2023-10-12 NOTE — Unmapped (Addendum)
-  Following closely with Cardiology  -In August, we had to DECREASE her Losartan  50mg  --> 25mg  due to hyperkalemia and AKI    BP Readings from Last 3 Encounters:   10/11/23 133/79   08/21/23 126/82   08/20/23 121/66     -Her BP is stable at this time  -Continue:    -Losartan  25mg  every day    -Metoprolol  succinate 50mg  at bedtime    -Spironolactone  25mg  every day    -Aspirin  81mg  every day   -Atorvastatin  10mg  every day    -Fenofibrate  145mg  every day    -Lasix  20mg  three times a week    -Labs below  Orders:    Lipid Panel

## 2023-10-12 NOTE — Unmapped (Addendum)
-  She previously noted to have used supplement oxygen intermittently at night  -She is scheduled with Pulmonology in November 2025 for management of her supplemental oxygen

## 2023-10-12 NOTE — Unmapped (Signed)
-  previously prescribed Tramadol  for severe pain   -Would recommend not to take NSAIDs at this time due to renal function and electrolyte abnormalities

## 2023-10-12 NOTE — Unmapped (Addendum)
-  stable at this time  -She does take Lorazepam  PRN   -Previously tried Propranolol but developed sedation and stopped  -PDMP was reviewed and is appropriate.     -Medication list includes Hydroxyzine ; however unsure if she is taking this. Given this, will remove from medication list

## 2023-10-12 NOTE — Unmapped (Addendum)
-  Following with Heme/Onc, Last visit on 08/21/2023:     ASSESSMENT:  Cindy Gonzalez is an 83 y.o. w/ a h/o erythrocytosis.  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  A bone marrow biopsy is not needed to make the diagnosis though it may offer additional prognostic information.  Since this information will note change her clinical care, I have deferred.       She also has an IDH2 and TET2 mutation.  This conveys a worse prognosis as described by the Sanger model.  Her prognostic calculations are similar if this is considered CHIP or an MPN.       Despite this, Cindy Gonzalez is doing well.  She continues to tolerate her current dose of hydroxyurea  and her HCT and platelets are within the target range.  Her ANC is slightly depressed, which warrants a slight decrease in her Hydrea .      She has worsening hypercalcemia today with a serum Ca 11.8 and ionized calcium  of 5.71. She is currently asymptomatic. The cause of this elevation is not clear.  We have ruled out hyperparathyroidism and multiple myeloma.  Previous measurement of Vitamin D have been WNL.  I have reviewed her medication list and none of these meds are associated with hypercalcemia.       We can look to more unusual causes.  She does not have hyper or hypotension making a pheo or adrenal cause unlikely.  Her PTH-RP is pending though this does not look like a paraneoplastic syndrome.  I have sent a TSH to rule out hyperthyroidism.  I have also reviewed her medication and I do not think any of her meds (except vitamin D) could be adding to the problem.       I am struck by the variation in her calcium  levels.  It is possible that her calcium  levels vary based on when she takes vitamin D.  However, that would be quite unusual.  I would be concerned about bony metastasis though she really does not have any symptoms suggestive of bony pain.     Her creatinine is elevated, which could be due to dehydration associated with hypercalcemia.  Her potassium is also elevated, which suggests we may need to hold her Losartan .  PLAN:  1) Lower Hydrea  to 5.0 g/wk   1 tablet BID on Mon - Wed   1 tablet every day on Thurs - Sun  2) Continue low dose aspirin   3) She should receive DVT prophylaxis if hospitalized.   4) FU on PTH-RP  5) Stop Vitamin D (since it may be confusing the picture)  6) Repeat labs next week (CMP)   7) Consider PET CT scanning   8) RTC in 3 months    Orders:    Lactate dehydrogenase    Comprehensive Metabolic Panel    CBC w/ Differential

## 2023-10-12 NOTE — Unmapped (Addendum)
-  Following with Heme/Onc, Last visit on 08/21/2023:     ASSESSMENT:  Ms Coote is an 83 y.o. w/ a h/o erythrocytosis.  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  A bone marrow biopsy is not needed to make the diagnosis though it may offer additional prognostic information.  Since this information will note change her clinical care, I have deferred.       She also has an IDH2 and TET2 mutation.  This conveys a worse prognosis as described by the Sanger model.  Her prognostic calculations are similar if this is considered CHIP or an MPN.       Despite this, Ms. Geeslin is doing well.  She continues to tolerate her current dose of hydroxyurea  and her HCT and platelets are within the target range.  Her ANC is slightly depressed, which warrants a slight decrease in her Hydrea .      She has worsening hypercalcemia today with a serum Ca 11.8 and ionized calcium  of 5.71. She is currently asymptomatic. The cause of this elevation is not clear.  We have ruled out hyperparathyroidism and multiple myeloma.  Previous measurement of Vitamin D have been WNL.  I have reviewed her medication list and none of these meds are associated with hypercalcemia.       We can look to more unusual causes.  She does not have hyper or hypotension making a pheo or adrenal cause unlikely.  Her PTH-RP is pending though this does not look like a paraneoplastic syndrome.  I have sent a TSH to rule out hyperthyroidism.  I have also reviewed her medication and I do not think any of her meds (except vitamin D) could be adding to the problem.       I am struck by the variation in her calcium  levels.  It is possible that her calcium  levels vary based on when she takes vitamin D.  However, that would be quite unusual.  I would be concerned about bony metastasis though she really does not have any symptoms suggestive of bony pain.     Her creatinine is elevated, which could be due to dehydration associated with hypercalcemia.  Her potassium is also elevated, which suggests we may need to hold her Losartan .  PLAN:  1) Lower Hydrea  to 5.0 g/wk   1 tablet BID on Mon - Wed   1 tablet every day on Thurs - Sun  2) Continue low dose aspirin   3) She should receive DVT prophylaxis if hospitalized.   4) FU on PTH-RP  5) Stop Vitamin D (since it may be confusing the picture)  6) Repeat labs next week (CMP)   7) Consider PET CT scanning   8) RTC in 3 months    Orders:    Lactate dehydrogenase    Comprehensive Metabolic Panel    CBC w/ Differential

## 2023-10-14 DIAGNOSIS — R748 Abnormal levels of other serum enzymes: Principal | ICD-10-CM

## 2023-10-14 DIAGNOSIS — R197 Diarrhea, unspecified: Principal | ICD-10-CM

## 2023-10-15 NOTE — Unmapped (Signed)
 Lab results per dr Keven, verbalized understanding of A1C and getting the liver US .    Leighton Dage, CMA

## 2023-10-15 NOTE — Unmapped (Signed)
-----   Message from [facility], DO sent at 10/14/2023  7:26 AM EDT -----  please contact this patient with the following    -Your A1C improved greatly from 8.1% to 6.7% which is great  -Your kidney function is stable at this time. Your potassium and electrolytes are in normal range  -Your liver enzymes however went up. I believe this might be due to your recent abdominal pain. However I would like to obtain an ultrasound of your liver  -Your blood counts appears stable at this time  -Your triglycerides were elevated compared to before  -We will not make medication changes at this time but if your diabetes is still in normal range next visit then we will stop your metformin  and continue Januvia.     Please let me know if she ahs questions. The liver Ultrasound order is in  ----- Message -----  From: Lab, Background User  Sent: 10/11/2023   5:20 PM EDT  To: [name] Mangel, DO

## 2023-10-16 DIAGNOSIS — G4700 Insomnia, unspecified: Principal | ICD-10-CM

## 2023-10-16 DIAGNOSIS — G250 Essential tremor: Principal | ICD-10-CM

## 2023-10-16 MED ORDER — LORAZEPAM 0.5 MG TABLET
ORAL_TABLET | ORAL | 0 refills | 0.00000 days | Status: CP
Start: 2023-10-16 — End: ?

## 2023-10-22 ENCOUNTER — Inpatient Hospital Stay: Admit: 2023-10-22 | Discharge: 2023-10-22 | Payer: Medicare (Managed Care)

## 2023-10-23 DIAGNOSIS — E118 Type 2 diabetes mellitus with unspecified complications: Principal | ICD-10-CM

## 2023-10-23 MED ORDER — LANCETS
3 refills | 0.00000 days | Status: CP
Start: 2023-10-23 — End: ?

## 2023-10-23 NOTE — Unmapped (Signed)
 Patient is requesting the following refill  Requested Prescriptions     Pending Prescriptions Disp Refills    lancets (ACCU-CHEK SOFTCLIX LANCETS) Misc [Pharmacy Med Name: ACCU-CHEK SOFTCLIX LANCETS LANCETS MISC]  0     Sig: USE ONE (1) LANCET TO CHECK GLUCOSE TWICE DAILY       Recent Visits  Date Type Provider Dept   10/11/23 Office Visit Mangel, [name], DO [facility] [address] [facility]   08/20/23 Office Visit Mangel, [name], DO [facility] [address] At [facility]   06/11/23 Office Visit Mangel, [name], DO [facility] [address] [facility]   02/15/23 Office Visit Mangel, [name], DO [place] [facility] [address] At [facility]   02/08/23 Office Visit Geralene Levorn Geralds, FNP [place] [facility] [address] At [facility]   Showing recent visits within past 365 days and meeting all other requirements  Future Appointments  Date Type Provider Dept   01/17/24 Appointment Mangel, [name], DO [place] [facility] [address] [facility]   Showing future appointments within next 365 days and meeting all other requirements       Labs: A1c:   Hemoglobin A1C (%)   Date Value   10/11/2023 6.7 (H)   06/14/2022 7.4 (A)

## 2023-10-25 DIAGNOSIS — I10 Essential (primary) hypertension: Principal | ICD-10-CM

## 2023-10-25 MED ORDER — FUROSEMIDE 20 MG TABLET
ORAL_TABLET | ORAL | 0 refills | 0.00000 days | Status: CP
Start: 2023-10-25 — End: ?

## 2023-10-25 NOTE — Telephone Encounter (Signed)
 Patient is requesting the following refill  Requested Prescriptions     Pending Prescriptions Disp Refills    furosemide  (LASIX ) 20 MG tablet [Pharmacy Med Name: Furosemide  20 MG Oral Tablet] 36 tablet 0     Sig: TAKE 1 TABLET BY MOUTH THREE TIMES A WEEK       Recent Visits  Date Type Provider Dept   10/11/23 Office Visit Mangel, [name], DO [place] [facility] [address] [facility]   08/20/23 Office Visit Mangel, [name], DO [place] [facility] [address] [facility]   06/11/23 Office Visit Mangel, [name], DO [place] [facility] [address] [facility]   02/15/23 Office Visit Mangel, [name], DO [place] [facility] [address] [facility]   02/08/23 Office Visit Geralene Levorn Geralds, FNP [place] [facility] [address] [facility]   Showing recent visits within past 365 days and meeting all other requirements  Future Appointments  Date Type Provider Dept   01/17/24 Appointment Mangel, [name], DO [place] [facility] [address] At [facility]   Showing future appointments within next 365 days and meeting all other requirements       Labs: Not applicable this refill

## 2023-10-28 DIAGNOSIS — R748 Abnormal levels of other serum enzymes: Principal | ICD-10-CM

## 2023-10-28 DIAGNOSIS — K760 Fatty (change of) liver, not elsewhere classified: Principal | ICD-10-CM

## 2023-10-28 NOTE — Telephone Encounter (Signed)
 Ultrasound results per Dr Keven.    Leighton Dage, CMA

## 2023-10-28 NOTE — Telephone Encounter (Signed)
-----   Message from [place], DO sent at 10/28/2023  7:53 AM EDT -----  Please contact this patient with the following:   -The ultrasound we obtained of your liver does show that you have fatty liver disease.   -please let me know if you would like to see GI for this. If not, we will continue to work on managing your diabetes and watch your labs closely.     Thank you  ----- Message -----  From: Interface, Rad Results In  Sent: 10/23/2023   4:14 PM EDT  To: [name] Mangel, DO

## 2023-10-28 NOTE — Addendum Note (Signed)
 Addended by: KEVEN CRUMBLY PAP on: 10/28/2023 03:37 PM     Modules accepted: Orders

## 2023-10-31 MED ORDER — ATORVASTATIN 10 MG TABLET
ORAL_TABLET | Freq: Every day | ORAL | 3 refills | 90.00000 days | Status: CP
Start: 2023-10-31 — End: 2024-10-30

## 2023-10-31 MED ORDER — SPIRONOLACTONE 25 MG TABLET
ORAL_TABLET | Freq: Every day | ORAL | 3 refills | 90.00000 days | Status: CP
Start: 2023-10-31 — End: ?

## 2023-10-31 MED ORDER — FUROSEMIDE 20 MG TABLET
ORAL_TABLET | Freq: Every day | ORAL | 3 refills | 30.00000 days | Status: CP | PRN
Start: 2023-10-31 — End: ?

## 2023-10-31 NOTE — Patient Instructions (Addendum)
 No med changes today per your preference. Recommend eventually switching losartan  to entresto or increasing dose of losartan .    Patients with [employer], [employer] or [employer] Medicare Advantage plans will continue to have access through 2025.    Effective January 02, 2024, [facility] will no longer be in-network with these Medicare Advantage plans (except [facility] Plan retirees).    Patients may keep access by selecting Original Medicare or an Medicare Advantage plan that includes [facility] during the Medicare Annual Enrollment Period (Oct. 15-Dec. 7, 2025).    Chapter is a free, personalized Medicare counseling organization to help patients find an in-network plan.    ? Call (573) [phone] or visit [email or web address]

## 2023-10-31 NOTE — Progress Notes (Signed)
 Ms. Cindy Gonzalez in today for follow up HF. Reports she takes lasix  twice weekly for lower extremity edema and occasionally 3x/week.      Denies SOB or CP.    She has not had BP meds today.  Takes at night. 148/78 manual

## 2023-10-31 NOTE — Progress Notes (Signed)
 DIVISION OF CARDIOLOGY   [facility] [place]                                                                         Date of Service:  10/31/2023     ASSESSMENT and PLAN:     1. HFrEF  LVEF 35-40% on last echo (07/2023). NYHA class I. Patient appears to be euvolemic.  Using lasix  20 mg ~2-3x/wk.  Previously has not tolerated Jardiance . Discussed switching to entresto or uptitrating losartan . She declines med changes today.   Continue toprol  50 mg QHS  Continue spironolactone  25 mg   Continue losartan  50 mg daily    2. Coronary artery disease involving native coronary artery of native heart without angina pectoris  No significant symptoms concerning for worsening of her nonobstructive coronary artery disease.   Continue atorvastatin  10 mg  Continue ASA 81 mg    Lab Results   Component Value Date    LDL 86 10/11/2023     3. Primary hypertension  SBP 130-140s  She declines med changes today    4. Aortic stenosis  Mild to moderate LFLG aortic stenosis on echo 01/29/23.  Repeat in 2-3 yrs     Return to clinic:  Insurance not covered at [facility] in January - may be switching care to [facility]. Contact info provided for Chapter.     I personally spent 32 minutes face-to-face and non-face-to-face in the care of this patient, which includes all pre, intra, and post visit time on the date of service.     SUBJECTIVE:     Chief complaint: Cindy Gonzalez is a 83 y.o. female with a history of HFrEF, HTN presenting for routine follow up.     History of present illness:  Patient with admission on 07/20/19 for acute hepatitis and pancreatitis. While in the hospital echo showed EF 45% and required IV diuresis.   Recently found to have worsening ejection fraction of 30-35% in January 2025.   Last seen by Dr. Sedalia 07/2023. Repeat echo 07/2023 showed EF 35-40%.   History of Present Illness  Cindy Gonzalez is an 83 year old female with hypertension and heart failure who presents for medication management.    She takes losartan  at night for blood pressure management, with home readings typically ranging between 130-140 mmHg, which are lower compared to clinic measurements. Her dose was increased from 25 mg to 50 mg, as she reports her primary care doctor made this adjustment.    She experiences occasional leg swelling and takes a diuretic twice a week, adjusting the frequency based on symptoms. She is considering an additional dose today due to some swelling. No significant shortness of breath or changes in energy levels, which she describes as 'pretty good'.    She mentions a change in her insurance plan, which may require her to switch healthcare providers to [facility]. She expresses concern about the potential need to change doctors and the associated inconvenience.    Review of Systems  10 systems were reviewed and negative except as noted in HPI.    Past Medical History:   Diagnosis Date    At risk for falls     left ankle arthritis; cane  Coronary artery disease     Diabetes mellitus (CMS-HCC)     Diffuse large B cell lymphoma    (CMS-HCC)     Hypertension     Impaired mobility     left ankle arthritis, cane    Visual impairment     glasses       Past Surgical History:   Procedure Laterality Date    CHOLECYSTECTOMY      HYSTERECTOMY      OOPHORECTOMY         Current Outpatient Medications   Medication Instructions    albuterol  HFA 90 mcg/actuation inhaler 2 puffs, Inhalation, Every 6 hours PRN    aspirin  (ECOTRIN) 81 mg, Oral, Daily (standard)    atorvastatin  (LIPITOR ) 10 mg, Oral, Daily (standard)    blood sugar diagnostic (ACCU-CHEK GUIDE TEST STRIPS) Strp Other, 2 times a day (AC)    blood-glucose meter kit Use as instructed    cycloSPORINE  0.1 % Drop Apply to eye.    estradiol  (ESTRACE ) 2 g, Vaginal, Daily (standard), Use externally prn    fenofibrate  (TRICOR ) 145 mg, Oral, Daily (standard)    fluticasone  propionate (FLONASE ) 50 mcg/actuation nasal spray Use 1 spray(s) in each nostril once daily    furosemide  (LASIX ) 20 mg, Oral, Daily PRN    hydroxyurea  (HYDREA ) 500 mg capsule Take 1 capsule (500 mg total) by mouth two (2) times a day Monday- Thursday AND 1 capsule (500 mg total) daily Friday- Sunday.    lancets (ACCU-CHEK SOFTCLIX LANCETS) Misc USE ONE (1) LANCET TO CHECK GLUCOSE TWICE DAILY    lancing device Misc USE TO CHECK BLOOD SUGAR 2 TIMES A DAY    LORazepam  (ATIVAN ) 0.5 MG tablet TAKE 1/2 (ONE-HALF) TABLET BY MOUTH TWICE DAILY AS NEEDED FOR ANXIETY    losartan  (COZAAR ) 50 mg, Oral, Daily (standard)    metoPROLOL  succinate (TOPROL -XL) 50 mg, Oral, Nightly    sitagliptin phos-metFORMIN  (JANUMET  XR) 50-500 mg TM24 2 tablets, Oral, Daily (standard)    spironolactone  (ALDACTONE ) 25 mg, Oral, Daily (standard)    traMADol  (ULTRAM ) 25 mg, Oral, Daily PRN      Allergies:  is allergic to amoxicillin , doxycycline , cephalexin, ciprofloxacin, hydrocodone-acetaminophen , and sulfamethoxazole.    Social History:  She  reports that she has never smoked. She has never used smokeless tobacco. She reports that she does not currently use alcohol. She reports that she does not use drugs.    Family History:  Her family history includes Asthma in her mother; Cancer in an other family member; Diabetes in her mother; Hypertension in her father; Stroke in her father.    OBJECTIVE:      BP 148/78 (BP Site: L Arm, BP Position: Sitting, BP Cuff Size: Medium) Comment: manual - Pulse 79  - Ht 157.5 cm (5' 2)  - Wt 80.8 kg (178 lb 3.2 oz)  - LMP  (LMP Unknown)  - SpO2 92%  - BMI 32.59 kg/m??    Wt Readings from Last 3 Encounters:   10/31/23 80.8 kg (178 lb 3.2 oz)   10/11/23 79.4 kg (175 lb)   08/21/23 80.2 kg (176 lb 12.9 oz)     BP Readings from Last 5 Encounters:   10/31/23 148/78   10/11/23 133/79   08/21/23 126/82   08/20/23 121/66   07/16/23 120/63     General-  Normal appearing female in no apparent distress.  Neurologic- Alert and oriented X3.  Cranial nerve II-XII grossly intact.  HEENT-  Normocephalic atraumatic head.  No scleral  icterus. MMM  Neck- Supple, no carotid bruis, no JVD  Lungs- Clear to auscultation, no wheezes, rhonchi, or rhales.  Heart-  RRR, 2/6 early peaking SEM at LUSB.   Extremities-  No clubbing or cyanosis.  Trace LE edema to lower shins bilaterally  Pulses- +2 radial pulses bilaterally.  Psych- Normal mood, appropriate.     Lab Results   Component Value Date    HGB 12.7 10/11/2023    HGB 13.1 08/21/2023    HGB 12.9 03/13/2023    PLT 140 (L) 10/11/2023    PLT 187 08/21/2023    PLT 203 03/13/2023     Lab Results   Component Value Date    CREATININE 1.14 (H) 10/11/2023    CREATININE 1.17 (H) 09/06/2023    CREATININE 1.25 (H) 08/26/2023    K 4.9 (H) 10/11/2023    K 4.5 09/06/2023    K 5.9 (H) 08/26/2023      Lab Results   Component Value Date    BNP 54.86 10/14/2021    BNP 39.19 02/14/2021    BNP 53.50 07/26/2019     Lab Results   Component Value Date    PROBNP 3,264.0 (H) 01/28/2023    PROBNP 352.0 (H) 01/27/2023    PROBNP 207.0 10/16/2021     Lab Results   Component Value Date    CHOL 163 10/11/2023    CHOL 152 06/14/2022    LDL 86 10/11/2023    LDL 81 06/14/2022    HDL 34 (L) 10/11/2023    HDL 37 (L) 06/14/2022    TRIG 280 (H) 10/11/2023    TRIG 171 (H) 06/14/2022     Lab Results   Component Value Date    A1C 6.7 (H) 10/11/2023     Electrocardiogram:  From 07/2019 showed SR, 1st degree AV block w/ occasional premature ventricular beats, left axis deviation, intraventricular conduction delay, possible anterolateral infarct.    From 03/04/20 showed sinus tach 114bpm, left axis deviation, LBBB (previously IVCD).     From 02/14/21 showed SR with 1st degree AV block, left axis deviation, LBBB.     Echocardiogram:  From 07/2019 showed LV normal in size w/ mildly increased wall thickness, LVEF 45%. Mild mitral annular calcification present. Mild aortic valve stenosis. RV normal in size, w/ normal systolic function.    From 04/12/21 showed LVEF 35-40%. Mitral annular calcification is present (moderate). The aortic valve is trileaflet with mildly thickened leaflets with mildly reduced excursion. The right ventricle is normal in size, with moderately reduced systolic function. There is an echo density within the RV apex that likely represents trabeculation but thrombus or mass can not be excluded.  Recommend clinical correlation and if appropriate, complimentary diagnostic testing.     Cardiac monitor:  From 04/01/21 showed patient had a min HR of 50 bpm, max HR of 154 bpm, and avg HR of 80 bpm. First Degree AV Block was present. Bundle Branch Block/IVCD was present. Predominant underlying rhythm was Sinus Rhythm. 4 Supraventricular Tachycardia runs occurred, the run with the fastest interval lasting 8 beats with a max rate of 154 bpm (avg 128 bpm); the run with the fastest interval was also the longest. Isolated SVEs were rare (<1.0%), SVE Couplets were rare (<1.0%), and SVE Triplets were rare (<1.0%). Isolated VEs were rare (<1.0%), VE Couplets were rare (<1.0%), and no VE Triplets were present. Ventricular Bigeminy and Trigeminy were present.  Symptoms associated with isolated ventricular ectopic beats.     CTA chest:  From  02/14/21 showed cardiac chambers normal in size.    Ascending and descending aorta normal in caliber.    There is no pericardial effusion. Coronary calcification.     Cardiac Catheterization:  Per report 2014 showed 30% mLAD, calcification LMCA.      Calton JINNY Helena, AGNP-C  Cardiology Nurse Practitioner  [facility]

## 2023-11-11 ENCOUNTER — Emergency Department
Admit: 2023-11-11 | Discharge: 2023-11-11 | Disposition: A | Discharge status: Home / Self Care | Payer: Medicare (Managed Care)

## 2023-11-11 DIAGNOSIS — R002 Palpitations: Principal | ICD-10-CM

## 2023-11-11 DIAGNOSIS — I10 Essential (primary) hypertension: Principal | ICD-10-CM

## 2023-11-11 LAB — BASIC METABOLIC PANEL
ANION GAP: 16 mmol/L — ABNORMAL HIGH (ref 5–14)
BLOOD UREA NITROGEN: 21 mg/dL (ref 9–23)
BUN / CREAT RATIO: 19
CALCIUM: 9.5 mg/dL (ref 8.7–10.4)
CHLORIDE: 107 mmol/L (ref 98–107)
CO2: 23.2 mmol/L (ref 20.0–31.0)
CREATININE: 1.13 mg/dL — ABNORMAL HIGH (ref 0.55–1.02)
EGFR CKD-EPI (2021) FEMALE: 48 mL/min/1.73m2 — ABNORMAL LOW (ref >=60)
GLUCOSE RANDOM: 149 mg/dL (ref 70–179)
POTASSIUM: 4.7 mmol/L (ref 3.4–4.8)
SODIUM: 146 mmol/L — ABNORMAL HIGH (ref 135–145)

## 2023-11-11 LAB — CBC W/ AUTO DIFF
BASOPHILS ABSOLUTE COUNT: 0 10*9/L (ref 0.0–0.1)
BASOPHILS RELATIVE PERCENT: 0.7 %
EOSINOPHILS ABSOLUTE COUNT: 0 10*9/L (ref 0.0–0.5)
EOSINOPHILS RELATIVE PERCENT: 0.3 %
HEMATOCRIT: 36.7 % (ref 34.0–44.0)
HEMOGLOBIN: 12.1 g/dL (ref 11.3–14.9)
LYMPHOCYTES ABSOLUTE COUNT: 0.6 10*9/L — ABNORMAL LOW (ref 1.1–3.6)
LYMPHOCYTES RELATIVE PERCENT: 29.3 %
MEAN CORPUSCULAR HEMOGLOBIN CONC: 33.1 g/dL (ref 32.0–36.0)
MEAN CORPUSCULAR HEMOGLOBIN: 36.7 pg — ABNORMAL HIGH (ref 25.9–32.4)
MEAN CORPUSCULAR VOLUME: 111.2 fL — ABNORMAL HIGH (ref 77.6–95.7)
MEAN PLATELET VOLUME: 8.4 fL (ref 6.8–10.7)
MONOCYTES ABSOLUTE COUNT: 0.2 10*9/L — ABNORMAL LOW (ref 0.3–0.8)
MONOCYTES RELATIVE PERCENT: 11.1 %
NEUTROPHILS ABSOLUTE COUNT: 1.2 10*9/L — ABNORMAL LOW (ref 1.8–7.8)
NEUTROPHILS RELATIVE PERCENT: 58.6 %
NUCLEATED RED BLOOD CELLS: 0 /100{WBCs} (ref <=4)
PLATELET COUNT: 142 10*9/L — ABNORMAL LOW (ref 150–450)
RED BLOOD CELL COUNT: 3.3 10*12/L — ABNORMAL LOW (ref 3.95–5.13)
RED CELL DISTRIBUTION WIDTH: 14.1 % (ref 12.2–15.2)
WBC ADJUSTED: 2.1 10*9/L — ABNORMAL LOW (ref 3.6–11.2)

## 2023-11-11 LAB — TSH: THYROID STIMULATING HORMONE: 2.416 u[IU]/mL (ref 0.550–4.780)

## 2023-11-11 LAB — MAGNESIUM: MAGNESIUM: 1.9 mg/dL (ref 1.6–2.6)

## 2023-11-11 MED ORDER — LOSARTAN 50 MG TABLET
ORAL_TABLET | Freq: Every day | ORAL | 0 refills | 30.00000 days | Status: CP
Start: 2023-11-11 — End: 2023-12-11

## 2023-11-11 NOTE — ED Provider Notes (Signed)
 [facility]  Emergency Department Provider Note      ED Clinical Impression      Final diagnoses:   Elevated blood pressure reading in office with diagnosis of hypertension (Primary)   Palpitations            Impression, Medical Decision Making, Progress Notes and Critical Care      Impression, Differential Diagnosis and Plan of Care    Medical Decision Making  An 83 year old woman with a history of hypertension, congestive heart failure, and chronic arthritis presented with increased palpitations over the past two days and elevated blood pressure (169/90 mmHg). She denied chest pain, dyspnea, lightheadedness, syncope, or peripheral edema. Physical exam revealed no acute findings, including no leg swelling and normal cardiopulmonary findings. She reported intermittent tremor managed with lorazepam . She has a documented allergy to injectable anticoagulants. There is a history of medication non-adherence and recent changes in her antihypertensive regimen were recommended by her cardiologist but not yet implemented.    Differential diagnosis includes, but is not limited to:  - Hypertensive urgency with medication non-adherence: Elevated blood pressure likely related to inconsistent antihypertensive use and refusal to increase losartan  as recommended by cardiology; no evidence of end-organ damage or acute symptoms.  - Congestive heart failure exacerbation: Considered due to history and palpitations, but no symptoms of dyspnea, chest pain, or edema on exam, making acute decompensation unlikely.  - Cardiac arrhythmia: Palpitations with increased frequency prompted evaluation for arrhythmia, but no associated syncope or hemodynamic instability; EKG and labs ordered for further assessment.  - Electrolyte Imbalance: Electrolyte abnormalities were considered as a possible cause for her palpitations and will be evaluated with laboratory studies.  - Thyroid Dysfunction: Thyroid hormone abnormalities were considered as a possible etiology for her palpitations and will be assessed with thyroid function tests.      Plan:    Hypertension with palpitations, CHF  - Order basic blood work including thyroid function tests and electrolytes  - Order EKG to assess cardiac rhythm  - Educated patient on medication adjustment and gradual effect of antihypertensives  - Consider adjusting antihypertensive medication to 50 mg losartan  twice daily if lab results are normal  - Advised patient to schedule follow-up with cardiology in one week to reassess medication regimen        Independent Interpretation of Studies    Labs and imaging reviewed: Patient has no significant leukocytosis, no evidence of anemia.  MCV and MCH were slightly elevated and I discussed this with the patient, advised that she may need to have vitamin B12 or folic acid levels assessed by PCP but no indication to have this done emergently today.  There is slight elevation in sodium at 146, no critical findings.  Renal function at baseline.  TSH within normal limits magnesium  level within normal limits.    At this time is our impression the patient is not experiencing symptoms of endorgan failure with relation to elevated blood pressure readings.  I have reviewed the patient's previous record and noted that her cardiologist did discuss with her potential increase of antihypertensives, at this time will provide an additional prescription for 50 mg twice daily losartan , recommend close outpatient follow-up with PCP.  Discussed return precautions for development of chest pain shortness of breath syncope abnormal leg swelling, orthopnea or any other concerning symptoms.  The patient verbalized understanding and agreed.  She was discharged in stable condition.    Disclaimers:  -Portions of this record have been created using Dragon  dictation software. Dictation errors have been sought, but may not have been identified and corrected.  -This note was created in part using the AI scribe software Abridge. While efforts have been made to review and edit the content for accuracy, there may be errors, omissions, or misinterpretations due to automated transcription or summarization.     See chart and resident provider documentation for details.    ____________________________________________        HISTORY        Reason for Visit  Hypertension      HPI   History of Present Illness  Cindy Gonzalez is an 83 year old female with hypertension and congestive heart failure who presents with elevated blood pressure and palpitations.    She woke up at 4:30 AM feeling unwell with a blood pressure of 169/90 mmHg. She attributes the elevated blood pressure to increased arthritis pain in her hips, back, neck, and shoulders over the past few days. She has felt shaky and took lorazepam  this morning for shakiness and a tremor, and also took an aspirin . Palpitations have been more frequent for the past two days. She denies chest pain, dyspnea, lightheadedness, syncope, peripheral edema, nausea, vomiting, or visual disturbances.    She takes losartan  for hypertension, but declined to increase her dose from 50 mg to 100 mg daily. She took 25 mg of losartan  yesterday due to elevated blood pressure and completed her usual dose at night. She also takes furosemide  twice a week, with the last dose taken yesterday. She is allergic to certain anticoagulant injections, which previously caused a severe skin reaction.        Past Medical History[1]    Problem List[2]    Past Surgical History[3]    No current facility-administered medications for this encounter.    Current Outpatient Medications:     albuterol  HFA 90 mcg/actuation inhaler, Inhale 2 puffs every six (6) hours as needed for wheezing., Disp: 18 g, Rfl: 2    aspirin  (ECOTRIN) 81 MG tablet, Take 1 tablet (81 mg total) by mouth daily., Disp: , Rfl:     atorvastatin  (LIPITOR ) 10 MG tablet, Take 1 tablet (10 mg total) by mouth daily., Disp: 90 tablet, Rfl: 3    blood sugar diagnostic (ACCU-CHEK GUIDE TEST STRIPS) Strp, by Other route Two (2) times a day (30 minutes before a meal)., Disp: 200 each, Rfl: 3    blood-glucose meter kit, Use as instructed, Disp: , Rfl:     cycloSPORINE  0.1 % Drop, Apply to eye., Disp: , Rfl:     estradiol  (ESTRACE ) 0.01 % (0.1 mg/gram) vaginal cream, Insert 2 g into the vagina daily. Use externally prn, Disp: 42.5 g, Rfl: 11    fenofibrate  (TRICOR ) 145 MG tablet, Take 1 tablet by mouth once daily, Disp: 90 tablet, Rfl: 0    fluticasone  propionate (FLONASE ) 50 mcg/actuation nasal spray, Use 1 spray(s) in each nostril once daily, Disp: 16 g, Rfl: 0    furosemide  (LASIX ) 20 MG tablet, Take 1 tablet (20 mg total) by mouth daily as needed for swelling., Disp: 30 tablet, Rfl: 3    hydroxyurea  (HYDREA ) 500 mg capsule, Take 1 capsule (500 mg total) by mouth two (2) times a day Monday- Thursday AND 1 capsule (500 mg total) daily Friday- Sunday. (Patient taking differently: Take 1 capsule (500 mg total) by mouth two (2) times a day Monday,  1 capsule (500 mg total) daily Thurs- Sunday.), Disp: 150 capsule, Rfl: 11    lancets (ACCU-CHEK  SOFTCLIX LANCETS) Misc, USE ONE (1) LANCET TO CHECK GLUCOSE TWICE DAILY, Disp: 100 each, Rfl: 3    lancing device Misc, USE TO CHECK BLOOD SUGAR 2 TIMES A DAY, Disp: , Rfl:     LORazepam  (ATIVAN ) 0.5 MG tablet, TAKE 1/2 (ONE-HALF) TABLET BY MOUTH TWICE DAILY AS NEEDED FOR ANXIETY, Disp: 30 tablet, Rfl: 0    losartan  (COZAAR ) 50 MG tablet, Take 1 tablet (50 mg total) by mouth daily., Disp: 90 tablet, Rfl: 3    losartan  (COZAAR ) 50 MG tablet, Take 1 tablet (50 mg total) by mouth daily. This is to be taken with already prescribed 50 mg losartan  tablets (to increase to 50 mg tab twice daily)., Disp: 30 tablet, Rfl: 0    metoPROLOL  succinate (TOPROL -XL) 50 MG 24 hr tablet, TAKE 1 TABLET BY MOUTH AT BEDTIME, Disp: 90 tablet, Rfl: 2    sitagliptin phos-metFORMIN  (JANUMET  XR) 50-500 mg TM24, Take 2 tablets by mouth daily., Disp: 180 tablet, Rfl: 1    spironolactone  (ALDACTONE ) 25 MG tablet, Take 1 tablet (25 mg total) by mouth daily., Disp: 90 tablet, Rfl: 3    traMADol  (ULTRAM ) 50 mg tablet, Take 0.5 tablets (25 mg total) by mouth daily as needed for pain or severe pain., Disp: 15 tablet, Rfl: 0    Allergies  Amoxicillin , Doxycycline , Cephalexin, Ciprofloxacin, Hydrocodone-acetaminophen , and Sulfamethoxazole    Family History[4]    Social History  Short Social History[5]      PHYSICAL EXAM       ED Triage Vitals [11/11/23 0853]   Enc Vitals Group      BP 149/81      Pulse 85      SpO2 Pulse 94      Resp 16      Temp 36.5 ??C (97.7 ??F)      Temp Source Oral      SpO2 94 %      Weight       Height       Head Circumference       Peak Flow       Pain Score       Pain Loc       Pain Education       Exclude from Growth Chart        Physical Exam  GENERAL: Alert, cooperative, well developed, no acute distress.  HEENT: Normocephalic, normal oropharynx, moist mucous membranes, pupils equal, round, and reactive to light.  CHEST: Clear to auscultation bilaterally, no wheezes, rhonchi, or crackles.  CARDIOVASCULAR: Normal heart rate and rhythm, S1 and S2 normal without murmurs.  ABDOMEN: Soft, non-tender, non-distended, without organomegaly, normal bowel sounds.  EXTREMITIES: No cyanosis or edema.  NEUROLOGICAL: Cranial nerves grossly intact, moves all extremities without gross motor or sensory deficit.      RESULTS       Labs     Results for orders placed or performed during the hospital encounter of 11/11/23   Basic Metabolic Panel   Result Value Ref Range    Sodium 146 (H) 135 - 145 mmol/L    Potassium 4.7 3.4 - 4.8 mmol/L    Chloride 107 98 - 107 mmol/L    CO2 23.2 20.0 - 31.0 mmol/L    Anion Gap 16 (H) 5 - 14 mmol/L    BUN 21 9 - 23 mg/dL    Creatinine 8.86 (H) 0.55 - 1.02 mg/dL    BUN/Creatinine Ratio 19     eGFR CKD-EPI (2021) Female 48 (L) >=60 mL/min/1.70m2  Glucose 149 70 - 179 mg/dL    Calcium  9.5 8.7 - 10.4 mg/dL   Magnesium  Result Value Ref Range    Magnesium  1.9 1.6 - 2.6 mg/dL   TSH   Result Value Ref Range    TSH 2.416 0.550 - 4.780 uIU/mL   ECG 12 Lead   Result Value Ref Range    EKG Systolic BP  mmHg    EKG Diastolic BP  mmHg    EKG Ventricular Rate 78 BPM    EKG Atrial Rate 78 BPM    EKG P-R Interval 232 ms    EKG QRS Duration 140 ms    EKG Q-T Interval 410 ms    EKG QTC Calculation 467 ms    EKG Calculated P Axis 57 degrees    EKG Calculated R Axis -59 degrees    EKG Calculated T Axis 58 degrees    QTC Fredericia 447 ms   CBC w/ Differential   Result Value Ref Range    WBC 2.1 (L) 3.6 - 11.2 10*9/L    RBC 3.30 (L) 3.95 - 5.13 10*12/L    HGB 12.1 11.3 - 14.9 g/dL    HCT 63.2 65.9 - 55.9 %    MCV 111.2 (H) 77.6 - 95.7 fL    MCH 36.7 (H) 25.9 - 32.4 pg    MCHC 33.1 32.0 - 36.0 g/dL    RDW 85.8 87.7 - 84.7 %    MPV 8.4 6.8 - 10.7 fL    Platelet 142 (L) 150 - 450 10*9/L    nRBC 0 <=4 /100 WBCs    Neutrophils % 58.6 %    Lymphocytes % 29.3 %    Monocytes % 11.1 %    Eosinophils % 0.3 %    Basophils % 0.7 %    Absolute Neutrophils 1.2 (L) 1.8 - 7.8 10*9/L    Absolute Lymphocytes 0.6 (L) 1.1 - 3.6 10*9/L    Absolute Monocytes 0.2 (L) 0.3 - 0.8 10*9/L    Absolute Eosinophils 0.0 0.0 - 0.5 10*9/L    Absolute Basophils 0.0 0.0 - 0.1 10*9/L    Macrocytosis Slight (A) Not Present        Radiology     No results found.    Medications administered this visit     @MEDADMIN @  Procedures                  [1]   Past Medical History:  Diagnosis Date    At risk for falls     left ankle arthritis; cane     Coronary artery disease     Diabetes mellitus (CMS-HCC)     Diffuse large B cell lymphoma    (CMS-HCC)     Hypertension     Impaired mobility     left ankle arthritis, cane    Visual impairment     glasses   [2]   Patient Active Problem List  Diagnosis    Pancreatitis (HHS-HCC)    Hepatitis    Hypertension    Coronary artery disease    Atelectasis    Heart failure with mid-range ejection fraction (CMS-HCC)    Hypoxemia    Essential tremor Hyponatremia    Acute disorder of liver    NICM (nonischemic cardiomyopathy)    (CMS-HCC)    Extranodal marginal zone B-cell lymphoma (CMS-HCC)    Leukopenia    Diabetes mellitus (CMS-HCC)    Dyslipidemia    History of non-Hodgkin's lymphoma    Arthritis  Seborrheic dermatitis    Insomnia    Obesity    Osteoarthritis    Mild aortic stenosis    Dryness of vagina    Skin carcinoma    Vertigo    History of pneumonia    History of constipation    Erythrocytosis    Iron deficiency anemia    Iron deficiency    Thrombocytopenia, unspecified    BMI 34.0-34.9,adult    History of vertigo    Polycythemia vera (CMS-HCC)    Shortness of breath    Tachycardia    URI (upper respiratory infection)    Mild intermittent asthma without complication (HHS-HCC)    Anxiety disorder    Long term current use of aspirin     On home oxygen therapy   [3]   Past Surgical History:  Procedure Laterality Date    CHOLECYSTECTOMY      HYSTERECTOMY      OOPHORECTOMY     [4]   Family History  Problem Relation Age of Onset    Cancer Other     Diabetes Mother     Asthma Mother     Hypertension Father     Stroke Father    [5]   Social History  Tobacco Use    Smoking status: Never    Smokeless tobacco: Never   Vaping Use    Vaping status: Never Used   Substance Use Topics    Alcohol use: Not Currently    Drug use: Never        Georjean Falling [place], [place]  11/15/23 1053

## 2023-11-11 NOTE — ED Triage Note (Signed)
 BIB [facility]  Pt sts she checked her BP this morning and it was 169/80. I am just not feeling well, pt not able to elaborate on what symptoms or concerns she is having.   Hx of CHF.

## 2023-11-11 NOTE — ED Notes (Signed)
 Bed: 07  Expected date:   Expected time:   Means of arrival:   Comments:  EMS

## 2023-11-12 NOTE — Telephone Encounter (Signed)
 I spoke with [name] to confirm appointments on the following date(s): 12/04/2023 (Lab and Return Visit w/ [facility])    Ronnald Cerutti

## 2023-11-21 ENCOUNTER — Ambulatory Visit: Admit: 2023-11-21 | Discharge: 2023-11-22 | Payer: Medicare (Managed Care)

## 2023-11-21 DIAGNOSIS — I10 Essential (primary) hypertension: Principal | ICD-10-CM

## 2023-11-21 DIAGNOSIS — I5020 Unspecified systolic (congestive) heart failure: Principal | ICD-10-CM

## 2023-11-21 DIAGNOSIS — D7589 Other specified diseases of blood and blood-forming organs: Principal | ICD-10-CM

## 2023-11-21 DIAGNOSIS — R002 Palpitations: Principal | ICD-10-CM

## 2023-11-21 LAB — FOLATE: FOLATE: 19.8 ng/mL (ref >=5.4)

## 2023-11-21 LAB — VITAMIN B12: VITAMIN B-12: 501 pg/mL (ref 211–911)

## 2023-11-21 MED ORDER — LOSARTAN 50 MG TABLET
ORAL_TABLET | Freq: Two times a day (BID) | ORAL | 1 refills | 90.00000 days | Status: CP
Start: 2023-11-21 — End: 2024-05-19

## 2023-11-21 NOTE — Progress Notes (Signed)
 [facility] PRIMARY CARE [address] AT [facility]  [address]  [place] [address]  Dept: [phone]       ED Follow Up - Transition of Care Visit       Name: Cindy Gonzalez  Date of Birth: 30-May-1940  Today's Date: 11/21/2023    Admission Date: 11/11/23  Discharge Date: 11/11/23  Discharge Hospital/Unit: [facility] DEPT [facility]    The patient was discharged from [facility] to her home. Patient came to appointment alone.    ED discharge summary, all images, laboratory testing and treatment plan reviewed. Medication reconciliation complete today.      Patient Summary:         Issues addressed at your office visit today:  1. Primary hypertension    2. Palpitations    3. 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 Polycythemia vera (CMS-HCC)    5. Macrocytosis    6. Allergic rhinitis, unspecified seasonality, unspecified trigger    7. Sinus congestion    8. Aortic valve stenosis        Patient Instructions   You have a new prescription for losartan  to be taken twice daily. Monitor your energy level and breathing. If you feel this is getting any worse, please contact our office to be seen right away.    Monitor for any further palpitations. If this increases, we can place a heart monitor to further evaluate. Your heart rhythm was normal on exam today.    A referral was placed for you to establish care with a new cardiologist who will be covered under your new insurance plan starting in January.    Labs were drawn today to check your vitamin B12 and folate levels.    Dr. Devra will contact you with your lab results.     Future Appointments   Date Time Provider Department Center   11/21/2023 10:45 AM LAB [facility]   12/04/2023  1:15 PM ADULT ONC LAB [facility]   12/04/2023  2:00 PM Luanna Asberry Lolling, AGNP [id number]   12/04/2023  3:00 PM Dittus, Lonni Loving, MD [facility]   01/01/2024  8:00 AM PFT 3 [facility] 01/01/2024  9:00 AM Justino Heinz BROCKS, MD [facility]   01/17/2024 10:20 AM Mangel, [name], DO [facility]   01/31/2024  1:00 PM ADULT ONC LAB [facility]   01/31/2024  2:00 PM Gregg Duwaine Collar, PA [facility]       Assessment/Plan:         Assessment & Plan  Hypertension  Blood pressure improved although remains elevated here. Advised to check BP at home. Reviewed goal for BP, less than 130/80. Patient requesting new refill of losartan  50 mg which she is now taking twice daily and tolerating well.  - New prescription for losartan  50 mg BID  - Continue spironolactone  and metoprolol   - Monitor home BP with goal <130/80, contact our office if persistent higher readings    HFrEF  Congestive heart failure with recent increase in losartan  dosage due to elevated blood pressure. Has discussed potential switch to Entresto if symptoms persist. New insurance and will no longer be covered within [facility] system. She is switching primary care to [facility]. Advised that we go ahead and schedule her to see cardiology at [facility] in early January as well.  - Refilled losartan  prescription for 100 mg daily (50 mg twice daily) with  a 90-day supply.  - Referred to cardiology for evaluation in early January.  - Advised to monitor for worsening symptoms such as increased shortness of breath or palpitations and to call if symptoms worsen.    Palpitations  Intermittent palpitations, patient attributes to stress. Patient is resistant to new heart monitor placement due to discomfort with adhesive. Last monitoring in 2023. Agrees to alert us  if palpitations recur with frequency.  - Advised to monitor palpitations and consider heart monitor if symptoms worsen.    Chronic allergic rhinitis with sinus drainage and pressure  Endorses sinus pressure for 7 days with posterior drainage. Using fluticasone  daily. Chronic allergic rhinitis. Advised against antibiotics unless symptoms persist beyond two Fowler Antos or worsen significantly.  - Continue Flonase  for at least another week.  - Add nasal saline to help with drainage.  - Advised to monitor symptoms and contact if symptoms persist beyond two Emmarie Sannes or worsen significantly.    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 Requires ongoing management with hydroxyurea . Patient is concerned about vit B12. Macrocytosis present on CBC.  - Continue current management with hydroxyurea   - Follow up with hematology as scheduled  - Check vit B12 and folate levels today    Aortic stenosis  Aortic murmur present on exam. Chronic, stable. Will need on going monitoring with cardiology.     Orders Placed This Encounter   Procedures    Vitamin B12 Level    Folate Level    Cardiology       Subjective:      CC:    Chief Complaint   Patient presents with    Follow-up     HTN       HPI: Cindy Gonzalez is a 83 y.o. female patient of Dr. Keven who presents for ED follow up.    Evaluated in ED for:     ED summary:     An 83 year old woman with a history of hypertension, congestive heart failure, and chronic arthritis presented with increased palpitations over the past two days and elevated blood pressure (169/90 mmHg). She denied chest pain, dyspnea, lightheadedness, syncope, or peripheral edema. Physical exam revealed no acute findings, including no leg swelling and normal cardiopulmonary findings. She reported intermittent tremor managed with lorazepam . She has a documented allergy to injectable anticoagulants. There is a history of medication non-adherence and recent changes in her antihypertensive regimen were recommended by her cardiologist but not yet implemented.     Differential diagnosis includes, but is not limited to:  - Hypertensive urgency with medication non-adherence: Elevated blood pressure likely related to inconsistent antihypertensive use and refusal to increase losartan  as recommended by cardiology; no evidence of end-organ damage or acute symptoms.  - Congestive heart failure exacerbation: Considered due to history and palpitations, but no symptoms of dyspnea, chest pain, or edema on exam, making acute decompensation unlikely.  - Cardiac arrhythmia: Palpitations with increased frequency prompted evaluation for arrhythmia, but no associated syncope or hemodynamic instability; EKG and labs ordered for further assessment.  - Electrolyte Imbalance: Electrolyte abnormalities were considered as a possible cause for her palpitations and will be evaluated with laboratory studies.  - Thyroid Dysfunction: Thyroid hormone abnormalities were considered as a possible etiology for her palpitations and will be assessed with thyroid function tests.        Plan:     Hypertension with palpitations, CHF  - Order basic blood work including thyroid function tests and electrolytes  - Order EKG to assess cardiac rhythm  -  Educated patient on medication adjustment and gradual effect of antihypertensives  - Consider adjusting antihypertensive medication to 50 mg losartan  twice daily if lab results are normal  - Advised patient to schedule follow-up with cardiology in one week to reassess medication regimen     Interval update:   History of Present Illness  Cindy Gonzalez is an 83 year old female with congestive heart failure who presents for follow-up after a recent emergency room visit for elevated blood pressure and palpitations.    She has been experiencing intermittent palpitations, which have recently increased in frequency. These episodes are associated with stress. She has chronic arthritis in her back, neck, and shoulders, which she believes may contribute to her symptoms. She has previously worn a heart monitor but dislikes it due to skin irritation.    She has a history of congestive heart failure and was recently in the emergency room where her losartan  dosage was increased to 100 mg daily, taken as 50 mg in the morning and 50 mg in the evening. She also takes spironolactone  and a diuretic twice a week.     She experiences sinus drainage, which has been ongoing for over a week, causing facial pressure but no pain. She uses Flonase  daily, primarily at night.    She has a history of a blood disorder, which is currently stable, but she continues to take daily medication. 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 She is concerned about her vitamin B12 levels, which were previously high, and stopped taking her supplement and now wonders if she needs to resume supplementation.    She also reports occasional ear discomfort, particularly after washing her hair, but denies any current ear pain.      PHQ-9 PHQ-9 Total Score   06/20/2023  10:43 AM 1   05/09/2020   2:00 PM 1    10/15/2019   9:04 AM 2        Data saved with a previous flowsheet row definition       Current Medications[1]    I have personally reviewed the patients medication list and the current medications the patient is taking. I have reconciled their list and communicated any changes to the patient.      Objective:      Vital signs:  VITALS:   Vitals:    11/21/23 0934   BP: 150/72   BP Site: R Arm   BP Position: Sitting   BP Cuff Size: Medium   Pulse: 80   Resp: 19   Temp: 36.5 ??C (97.7 ??F)   TempSrc: Oral   SpO2: 95%   Weight: 80.5 kg (177 lb 8 oz)        Wt Readings from Last 3 Encounters:   11/21/23 80.5 kg (177 lb 8 oz)   10/31/23 80.8 kg (178 lb 3.2 oz)   10/11/23 79.4 kg (175 lb)        Physical Exam:  Physical Exam  Constitutional:       General: She is not in acute distress.     Appearance: Normal appearance. She is not ill-appearing or toxic-appearing.   HENT:      Right Ear: Tympanic membrane, ear canal and external ear normal.      Left Ear: Tympanic membrane, ear canal and external ear normal.      Nose: Congestion and rhinorrhea present.      Mouth/Throat:      Mouth: Mucous membranes are moist.  Pharynx: Oropharynx is clear.   Eyes:      General: No scleral icterus. Conjunctiva/sclera: Conjunctivae normal.   Cardiovascular:      Rate and Rhythm: Normal rate and regular rhythm.      Heart sounds: Murmur heard.      Systolic murmur is present with a grade of 2/6.      No gallop.      Comments: Murmur loudest at right sternal border.  Pulmonary:      Effort: Pulmonary effort is normal.      Breath sounds: Normal breath sounds. No wheezing, rhonchi or rales.   Musculoskeletal:      Right lower leg: No edema.      Left lower leg: No edema.   Skin:     General: Skin is warm and dry.   Neurological:      Mental Status: She is alert and oriented to person, place, and time.   Psychiatric:         Mood and Affect: Mood normal.         Behavior: Behavior normal.         Thought Content: Thought content normal.         Judgment: Judgment normal.           Labs/Imaging:  Results for orders placed or performed during the hospital encounter of 11/11/23   Basic Metabolic Panel   Result Value Ref Range    Sodium 146 (H) 135 - 145 mmol/L    Potassium 4.7 3.4 - 4.8 mmol/L    Chloride 107 98 - 107 mmol/L    CO2 23.2 20.0 - 31.0 mmol/L    Anion Gap 16 (H) 5 - 14 mmol/L    BUN 21 9 - 23 mg/dL    Creatinine 8.86 (H) 0.55 - 1.02 mg/dL    BUN/Creatinine Ratio 19     eGFR CKD-EPI (2021) Female 48 (L) >=60 mL/min/1.62m2    Glucose 149 70 - 179 mg/dL    Calcium  9.5 8.7 - 10.4 mg/dL   Magnesium    Result Value Ref Range    Magnesium  1.9 1.6 - 2.6 mg/dL   TSH   Result Value Ref Range    TSH 2.416 0.550 - 4.780 uIU/mL   ECG 12 Lead   Result Value Ref Range    EKG Systolic BP  mmHg    EKG Diastolic BP  mmHg    EKG Ventricular Rate 78 BPM    EKG Atrial Rate 78 BPM    EKG P-R Interval 232 ms    EKG QRS Duration 140 ms    EKG Q-T Interval 410 ms    EKG QTC Calculation 467 ms    EKG Calculated P Axis 57 degrees    EKG Calculated R Axis -59 degrees    EKG Calculated T Axis 58 degrees    QTC Fredericia 447 ms   CBC w/ Differential   Result Value Ref Range    WBC 2.1 (L) 3.6 - 11.2 10*9/L    RBC 3.30 (L) 3.95 - 5.13 10*12/L    HGB 12.1 11.3 - 14.9 g/dL    HCT 63.2 65.9 - 55.9 %    MCV 111.2 (H) 77.6 - 95.7 fL    MCH 36.7 (H) 25.9 - 32.4 pg    MCHC 33.1 32.0 - 36.0 g/dL    RDW 85.8 87.7 - 84.7 %    MPV 8.4 6.8 - 10.7 fL    Platelet 142 (L) 150 - 450 10*9/L  nRBC 0 <=4 /100 WBCs    Neutrophils % 58.6 %    Lymphocytes % 29.3 %    Monocytes % 11.1 %    Eosinophils % 0.3 %    Basophils % 0.7 %    Absolute Neutrophils 1.2 (L) 1.8 - 7.8 10*9/L    Absolute Lymphocytes 0.6 (L) 1.1 - 3.6 10*9/L    Absolute Monocytes 0.2 (L) 0.3 - 0.8 10*9/L    Absolute Eosinophils 0.0 0.0 - 0.5 10*9/L    Absolute Basophils 0.0 0.0 - 0.1 10*9/L    Macrocytosis Slight (A) Not Present         I have reviewed the labs from this hospitalization and the ones on the day of discharge and have followed up on any pending labs at the time of discharge.  See Epic Labs section for details.  ______________________________________________________________________      Montie Duos, MD  Family Physician    [facility]  [address]  [place], [place] [address]  Ph [phone]  Fax [phone]           [1]   Current Outpatient Medications:     albuterol  HFA 90 mcg/actuation inhaler, Inhale 2 puffs every six (6) hours as needed for wheezing., Disp: 18 g, Rfl: 2    aspirin  (ECOTRIN) 81 MG tablet, Take 1 tablet (81 mg total) by mouth daily., Disp: , Rfl:     atorvastatin  (LIPITOR ) 10 MG tablet, Take 1 tablet (10 mg total) by mouth daily., Disp: 90 tablet, Rfl: 3    blood sugar diagnostic (ACCU-CHEK GUIDE TEST STRIPS) Strp, by Other route Two (2) times a day (30 minutes before a meal)., Disp: 200 each, Rfl: 3    blood-glucose meter kit, Use as instructed, Disp: , Rfl:     cycloSPORINE  0.1 % Drop, Apply to eye., Disp: , Rfl:     estradiol  (ESTRACE ) 0.01 % (0.1 mg/gram) vaginal cream, Insert 2 g into the vagina daily. Use externally prn, Disp: 42.5 g, Rfl: 11    fenofibrate  (TRICOR ) 145 MG tablet, Take 1 tablet by mouth once daily, Disp: 90 tablet, Rfl: 0    fluticasone  propionate (FLONASE ) 50 mcg/actuation nasal spray, Use 1 spray(s) in each nostril once daily, Disp: 16 g, Rfl: 0    furosemide  (LASIX ) 20 MG tablet, Take 1 tablet (20 mg total) by mouth daily as needed for swelling., Disp: 30 tablet, Rfl: 3    hydroxyurea  (HYDREA ) 500 mg capsule, Take 1 capsule (500 mg total) by mouth two (2) times a day Monday- Thursday AND 1 capsule (500 mg total) daily Friday- Sunday. (Patient taking differently: Take 1 capsule (500 mg total) by mouth two (2) times a day Monday,  1 capsule (500 mg total) daily Thurs- Sunday.), Disp: 150 capsule, Rfl: 11    lancets (ACCU-CHEK SOFTCLIX LANCETS) Misc, USE ONE (1) LANCET TO CHECK GLUCOSE TWICE DAILY, Disp: 100 each, Rfl: 3    lancing device Misc, USE TO CHECK BLOOD SUGAR 2 TIMES A DAY, Disp: , Rfl:     LORazepam  (ATIVAN ) 0.5 MG tablet, TAKE 1/2 (ONE-HALF) TABLET BY MOUTH TWICE DAILY AS NEEDED FOR ANXIETY, Disp: 30 tablet, Rfl: 0    losartan  (COZAAR ) 50 MG tablet, Take 1 tablet (50 mg total) by mouth two (2) times a day., Disp: 180 tablet, Rfl: 1    metoPROLOL  succinate (TOPROL -XL) 50 MG 24 hr tablet, TAKE 1 TABLET BY MOUTH AT BEDTIME, Disp: 90 tablet, Rfl: 2    sitagliptin phos-metFORMIN  (  JANUMET  XR) 50-500 mg TM24, Take 2 tablets by mouth daily., Disp: 180 tablet, Rfl: 1    spironolactone  (ALDACTONE ) 25 MG tablet, Take 1 tablet (25 mg total) by mouth daily., Disp: 90 tablet, Rfl: 3    traMADol  (ULTRAM ) 50 mg tablet, Take 0.5 tablets (25 mg total) by mouth daily as needed for pain or severe pain., Disp: 15 tablet, Rfl: 0

## 2023-11-21 NOTE — Patient Instructions (Signed)
 You have a new prescription for losartan  to be taken twice daily. Monitor your energy level and breathing. If you feel this is getting any worse, please contact our office to be seen right away.    Monitor for any further palpitations. If this increases, we can place a heart monitor to further evaluate. Your heart rhythm was normal on exam today.    A referral was placed for you to establish care with a new cardiologist who will be covered under your new insurance plan starting in January.    Labs were drawn today to check your vitamin B12 and folate levels.    Dr. Devra will contact you with your lab results.

## 2023-11-22 DIAGNOSIS — J302 Other seasonal allergic rhinitis: Principal | ICD-10-CM

## 2023-11-22 MED ORDER — FLUTICASONE PROPIONATE 50 MCG/ACTUATION NASAL SPRAY,SUSPENSION
NASAL | 0 refills | 0.00000 days | Status: CP
Start: 2023-11-22 — End: ?

## 2023-11-22 NOTE — Telephone Encounter (Signed)
 Patient is requesting the following refill  Requested Prescriptions     Pending Prescriptions Disp Refills    fluticasone  propionate (FLONASE ) 50 mcg/actuation nasal spray [Pharmacy Med Name: FLUTICASONE  PROPIONATE SUSPENSION]  0     Sig: USE ONE (1) SPRAY IN EACH NOSTRIL ONCE DAILY       Recent Visits  Date Type Provider Dept   11/21/23 Office Visit Devra Montie Hope, MD [facility] [address] [facility]   10/11/23 Office Visit Mangel, [name], DO [facility] [address] At [facility]   08/20/23 Office Visit Mangel, [name], DO [facility] [address] [facility]   06/11/23 Office Visit Mangel, [name], DO [facility] [address] At [facility]   02/15/23 Office Visit Mangel, [name], DO [facility] [address] At [facility]   02/08/23 Office Visit Geralene Levorn Geralds, FNP  Primary Care [address] At [facility]   Showing recent visits within past 365 days and meeting all other requirements  Future Appointments  Date Type Provider Dept   01/17/24 Appointment Mangel, [name], DO  Primary Care [address] At [facility]   Showing future appointments within next 365 days and meeting all other requirements       Labs: Not applicable this refill

## 2023-11-26 DIAGNOSIS — G250 Essential tremor: Principal | ICD-10-CM

## 2023-11-26 DIAGNOSIS — G4700 Insomnia, unspecified: Principal | ICD-10-CM

## 2023-11-26 MED ORDER — LORAZEPAM 0.5 MG TABLET
ORAL_TABLET | ORAL | 0 refills | 0.00000 days | Status: CP
Start: 2023-11-26 — End: ?

## 2023-12-02 DIAGNOSIS — M1990 Unspecified osteoarthritis, unspecified site: Principal | ICD-10-CM

## 2023-12-02 MED ORDER — MELOXICAM 7.5 MG TABLET
ORAL_TABLET | Freq: Every day | ORAL | 0 refills | 90.00000 days
Start: 2023-12-02 — End: ?

## 2023-12-02 NOTE — Telephone Encounter (Signed)
 No longer taking

## 2023-12-04 ENCOUNTER — Ambulatory Visit
Admit: 2023-12-04 | Discharge: 2023-12-04 | Payer: Medicare (Managed Care) | Attending: Adult Health | Primary: Adult Health

## 2023-12-04 ENCOUNTER — Ambulatory Visit: Admit: 2023-12-04 | Discharge: 2023-12-04 | Payer: Medicare (Managed Care)

## 2023-12-04 ENCOUNTER — Ambulatory Visit
Admit: 2023-12-04 | Discharge: 2023-12-04 | Payer: Medicare (Managed Care) | Attending: Student in an Organized Health Care Education/Training Program | Primary: Student in an Organized Health Care Education/Training Program

## 2023-12-04 ENCOUNTER — Other Ambulatory Visit: Admit: 2023-12-04 | Discharge: 2023-12-04 | Payer: Medicare (Managed Care)

## 2023-12-04 DIAGNOSIS — D751 Secondary polycythemia: Principal | ICD-10-CM

## 2023-12-04 DIAGNOSIS — C884 MALT (mucosa associated lymphoid tissue) (CMS-HCC): Principal | ICD-10-CM

## 2023-12-04 DIAGNOSIS — I10 Essential (primary) hypertension: Principal | ICD-10-CM

## 2023-12-04 LAB — CBC W/ AUTO DIFF
BASOPHILS ABSOLUTE COUNT: 0 10*9/L (ref 0.0–0.1)
BASOPHILS RELATIVE PERCENT: 1.2 %
EOSINOPHILS ABSOLUTE COUNT: 0 10*9/L (ref 0.0–0.5)
EOSINOPHILS RELATIVE PERCENT: 0.1 %
HEMATOCRIT: 39.4 % (ref 34.0–44.0)
HEMOGLOBIN: 12.9 g/dL (ref 11.3–14.9)
LYMPHOCYTES ABSOLUTE COUNT: 0.9 10*9/L — ABNORMAL LOW (ref 1.1–3.6)
LYMPHOCYTES RELATIVE PERCENT: 31.7 %
MEAN CORPUSCULAR HEMOGLOBIN CONC: 32.7 g/dL (ref 32.0–36.0)
MEAN CORPUSCULAR HEMOGLOBIN: 36 pg — ABNORMAL HIGH (ref 25.9–32.4)
MEAN CORPUSCULAR VOLUME: 110 fL — ABNORMAL HIGH (ref 77.6–95.7)
MEAN PLATELET VOLUME: 8.8 fL (ref 6.8–10.7)
MONOCYTES ABSOLUTE COUNT: 0.3 10*9/L (ref 0.3–0.8)
MONOCYTES RELATIVE PERCENT: 9.5 %
NEUTROPHILS ABSOLUTE COUNT: 1.6 10*9/L — ABNORMAL LOW (ref 1.8–7.8)
NEUTROPHILS RELATIVE PERCENT: 57.5 %
PLATELET COUNT: 174 10*9/L (ref 150–450)
RED BLOOD CELL COUNT: 3.58 10*12/L — ABNORMAL LOW (ref 3.95–5.13)
RED CELL DISTRIBUTION WIDTH: 13.6 % (ref 12.2–15.2)
WBC ADJUSTED: 2.9 10*9/L — ABNORMAL LOW (ref 3.6–11.2)

## 2023-12-04 LAB — COMPREHENSIVE METABOLIC PANEL
ALBUMIN: 4.2 g/dL (ref 3.4–5.0)
ALKALINE PHOSPHATASE: 31 U/L — ABNORMAL LOW (ref 46–116)
ALT (SGPT): 36 U/L (ref 10–49)
ANION GAP: 11 mmol/L (ref 5–14)
AST (SGOT): 44 U/L — ABNORMAL HIGH (ref <=34)
BILIRUBIN TOTAL: 0.4 mg/dL (ref 0.3–1.2)
BLOOD UREA NITROGEN: 32 mg/dL — ABNORMAL HIGH (ref 9–23)
BUN / CREAT RATIO: 24
CALCIUM: 9.7 mg/dL (ref 8.7–10.4)
CHLORIDE: 107 mmol/L (ref 98–107)
CO2: 25 mmol/L (ref 20.0–31.0)
CREATININE: 1.34 mg/dL — ABNORMAL HIGH (ref 0.55–1.02)
EGFR CKD-EPI (2021) FEMALE: 39 mL/min/1.73m2 — ABNORMAL LOW (ref >=60)
GLUCOSE RANDOM: 157 mg/dL (ref 70–179)
POTASSIUM: 5.3 mmol/L — ABNORMAL HIGH (ref 3.4–4.8)
PROTEIN TOTAL: 7.5 g/dL (ref 5.7–8.2)
SODIUM: 143 mmol/L (ref 135–145)

## 2023-12-04 LAB — LACTATE DEHYDROGENASE: LACTATE DEHYDROGENASE: 222 U/L (ref 120–246)

## 2023-12-04 MED ORDER — SPIRONOLACTONE 25 MG TABLET
ORAL_TABLET | Freq: Every day | ORAL | 0 refills | 0.00000 days
Start: 2023-12-04 — End: ?

## 2023-12-04 NOTE — Patient Instructions (Signed)
 Decrease your hydrea  to 2 capsules on Monday and Tuesday, 1 capsule Wednesday-Sunday. This will bring up your neutrophils (bacteria fighting cells) without negatively affecting your hematocrit (the number we watch in PV).    VERY IMPORTANT that you call the company called [employer].  They are collaborating with [facility] to help Medicare patients find plans that keep us  in your network. [phone].   Open enrollment ends on Sunday!  It is important that you do this today or tomorrow!  We will set up a 4 month follow up, but if you're unable to change your Medicare plan, we will cancel.        If you have any questions, please do not hesitate to contact us .    **Fevers over 100.4 are an EMERGENCY. Please go to the nearest ER**    If you experience new or worsening of the following symptoms, please call:  1. Night sweats  2. Abdominal discomfort in the left upper quadrant  3. Feeling full early  4. Excessive fatigue  5. Unintentional weight loss  6. Bone pain  7. Itching after hot showers    When reviewing your results, please remember that the results of many of the tests we order can vary somewhat and that variation often means nothing.  Sometimes when we get results back after your clinic visit, if it looks like there???s some variation of that type, we may decide to recheck things sooner than we discussed in clinic.  If you get a call that we want to recheck things sooner, do not panic. It does not mean that things are going wrong.    For appointments & questions Monday through Friday 8 AM-- 5 PM   please call [phone] or Toll free [phone].    On Nights, Weekends and Holidays  Call [phone] and ask for the adult hematologist/oncologist on call.    Rhoda Adjutant, AGPCNP-C, MSN  Nurse Practitioner  Hematologic Malignancies  [facility]    Nurse Navigator: Wyvonna Sor, RN  Questions and appointments M-F 8am - 5pm: [phone] or [phone]    [facility]  [address]  [place], [place] [address]  [email or web address]    Results for orders placed or performed in visit on 12/04/23   CBC w/ Differential   Result Value Ref Range    WBC 2.9 (L) 3.6 - 11.2 10*9/L    RBC 3.58 (L) 3.95 - 5.13 10*12/L    HGB 12.9 11.3 - 14.9 g/dL    HCT 60.5 65.9 - 55.9 %    MCV 110.0 (H) 77.6 - 95.7 fL    MCH 36.0 (H) 25.9 - 32.4 pg    MCHC 32.7 32.0 - 36.0 g/dL    RDW 86.3 87.7 - 84.7 %    MPV 8.8 6.8 - 10.7 fL    Platelet 174 150 - 450 10*9/L    Neutrophils % 57.5 %    Lymphocytes % 31.7 %    Monocytes % 9.5 %    Eosinophils % 0.1 %    Basophils % 1.2 %    Absolute Neutrophils 1.6 (L) 1.8 - 7.8 10*9/L    Absolute Lymphocytes 0.9 (L) 1.1 - 3.6 10*9/L    Absolute Monocytes 0.3 0.3 - 0.8 10*9/L    Absolute Eosinophils 0.0 0.0 - 0.5 10*9/L    Absolute Basophils 0.0 0.0 - 0.1 10*9/L    Macrocytosis Slight (A) Not Present

## 2023-12-04 NOTE — Progress Notes (Unsigned)
 ID:  Cindy Gonzalez is an 83 y.o. with PCV    DZ CHAR:   5.6/17.8/55.8/243; K 5.3; LDH: 248  Epo < 1  Smear: No immature forms; nl RBC morphology; smudge cells; reactive lymph   Ferritin: 27.5  LDH: 399  No h/o thrombosis.     Variants of Known/Likely Clinical Significance:   Gene Coding Predicted Protein Variant allele fraction   IDH2 c.419G>A p.(Arg140Gln) 26.7%   JAK2 c.1849G>T p.(Val617Phe) 69.8%   TET2 c.4045-2A>G p.(?) 13.4%      PROGNOSIS  As clonal hematopoiesis of indeterminate prognosis (CHIP)  Score Risk  5 year AML 10 year AML 10 year OS   13.00 High Risk 24.4 ?? 4.12% 52.2 ?? 4.96% 51.2 ?? 4.51%     As a myeloproliferative neoplasm (MPN)  5 yr OS: 66/9%  AML Risk: 19.8%    ASSESSMENT:   Cindy Gonzalez is an 83 y.o. w/ a h/o erythrocytosis.  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  A bone marrow biopsy is not needed to make the diagnosis though it may offer additional prognostic information.  Since this information will note change her clinical care, I have deferred.      She also has an IDH2 and TET2 mutation.  This conveys a worse prognosis as described by the Sanger model.  Her prognostic calculations are similar if this is considered CHIP or an MPN.      Despite this, Cindy. Gonzalez is doing well.  She continues to tolerate her current dose of hydroxyurea  and her HCT and platelets are within the target range.  Her ANC is slightly depressed, which warrants a slight decrease in her Hydrea .     She has worsening hypercalcemia today with a serum Ca 11.8 and ionized calcium  of 5.71. She is currently asymptomatic. The cause of this elevation is not clear.  We have ruled out hyperparathyroidism and multiple myeloma.  Previous measurement of Vitamin D have been WNL.  I have reviewed her medication list and none of these meds are associated with hypercalcemia.      We can look to more unusual causes.  She does not have hyper or hypotension making a pheo or adrenal cause unlikely.  Her PTH-RP is pending though this does not look like a paraneoplastic syndrome.  I have sent a TSH to rule out hyperthyroidism.  I have also reviewed her medication and I do not think any of her meds (except vitamin D) could be adding to the problem.      I am struck by the variation in her calcium  levels.  It is possible that her calcium  levels vary based on when she takes vitamin D.  However, that would be quite unusual.  I would be concerned about bony metastasis though she really does not have any symptoms suggestive of bony pain.    Her creatinine is elevated, which could be due to dehydration associated with hypercalcemia.  Her potassium is also elevated, which suggests we may need to hold her Losartan .        Assessment & Plan  Polycythemia vera  Hematocrit is well-controlled at 39.4 with ongoing hydroxyurea  and low-dose aspirin  therapy. There is no evidence of bleeding or thrombotic complications. Renal function has improved since August, and calcium  levels have normalized following vitamin D supplementation adjustment. She experiences some fatigue and lethargy. Hydroxyurea  dose adjustments are being made to address cytopenias while maintaining hematocrit control.  Hydroxyurea  is reduced to two tablets on Monday and Tuesday, and one tablet Wednesday through Sunday to balance hematocrit control and minimize cytopenias. Written instructions for the revised hydroxyurea  regimen were provided. Low-dose aspirin  is continued for thrombosis prophylaxis with instructions to monitor for bleeding. The current vitamin D regimen is maintained with advice to monitor renal function and calcium  levels. She is advised to avoid [facility] insurance to preserve continuity of care and was provided with Medicare plan assistance contact information.    Leukopenia secondary to hydroxyurea   Leukopenia and neutropenia persist with a WBC of 2.9, likely secondary to hydroxyurea . Counts have improved slightly but remain suboptimal. No cytopenic complications are reported. Hydroxyurea  dose is reduced by one tablet per week to address leukopenia and neutropenia.        PLAN:  1) Lower Hydrea  to 4.5 g/wk   1 tablet BID on Mon - Wed   1 tablet every day on Thurs - Sun  2) Continue low dose aspirin   3) She should receive DVT prophylaxis if hospitalized.   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    04/2020: 3.2/13.7/41.1/225; ANC: 1.9; ALC: 1.0  02/2021: CTA: Negative; B12: 1,251  04/2021: TTE: EF 35-40%; 5.5/14.6/44.3/268; LDH: 234  10/14/21: 8.0/17.7/55.4/265; AMC: 0.1; ALC: 0.5; K: 5.0  10/19/21: JAK2: 64.9; Epo < 1  11/08/21: 5.6/17.8/55.8/243; K 5.3; LDH: 248  02/06/22: Seen by [facility]  7.8/19.5/59.7/262  Smear: No immature forms; nl RBC morphology; smudge cells; reactive lymph   Myeloid mutation panel: IDH2, JAK2, TET2  Ferritin: 27.5  LDH: 399  02/14/22: Started Hydroxyurea  3.5 g/wk  6.5/18.8/58.4/236; MCV: 84.1  02/23/22: Therapeutic phlebotomy   5.9/19.5/60.6/261  Ferritin: 25.3  02/26/22: 6.3/18.4/57.2/262; Hydrea  5.5 g/wk  03/09/22: Phlebotomy;   4.3/17.8/.55.4/153;  Ferritin: 18.3  03/17/22: 5.0/17.7/54.9/114; MCV: 87.5  04/10/22:Phlebotomy; 3.0/17.2/52.2/145; Hydrea  7 g/wk;   MCV: 90.0  ferritin: 265.5  06/12/22: 2.7/11.8/34.2/115; ANC 1.5: Hydrea  reduced 6 g/wk   07/24/22: 2.4/12.5/37.0/141, ANC 1.3; Hydrea  reduced to 5.5g/wk  09/18/22: 3.0/13.5/160; ANC: 1.8  12/19/22: 2.7/12.8/173  01/28/23: Admitted for shortness of breath; noted to have elevated BNP  03/13/23: 3.2/12.9/38.1/203  08/21/23: 2.4/13.1/38.7/187; Hydrea  reduced to 5 g a week.   10/11/23: 2.4/12.7/39.4/140, ANC 1.5  11/11/23: 2.1/12.1/36.7/142, ANC 1.2  12/04/23: 2.9/12.9/39.4/174, ANC 1.6    History of Present Illness  The patient, with a history of heart failure with reduced ejection fraction, presents for follow-up after hospitalization for pneumonia and heart evaluation. She was brought to the emergency room by her son and daughter-in-law. She provided blood pressure records to her cardiologist, Dr. Velma Hutchinson, who is considering a medication change.    In general she is feeling well. She denies abdominal pain, early satiety, fatigue, pruritus, headache, vision changes, etc. She does report some tingling in the feet and leg cramping at night. She is taking hydroxyurea  as prescribed and tolerating well without any GI side effects.     She has a history of heart failure with a reduced ejection fraction, which was 35-40% in April 2023 and has decreased to 30-35% as of January 2025. She was  previously on lisinopril  but was switched to losartan  to strengthen her heart muscle. She takes three Lasix  pills a week to manage fluid retention and monitors her weight regularly. No significant swelling is reported. She is on a low sodium diet and monitors her weight daily.     She is also taking vitamin D3, but the current dosage may be excessive, contributing to slightly elevated calcium  levels.       Cindy Gonzalez is an 83 year old female with polycythemia vera on hydroxyurea  and aspirin  who presents for oncology follow-up and management of cytopenias.    She is maintained on hydroxyurea , currently taking two tablets on Monday and Tuesday and one tablet Wednesday through Sunday, a regimen initiated in August. Her most recent hematocrit was 39.4. She continues low-dose aspirin  and has not experienced any bleeding, including epistaxis or gingival bleeding.    Her most recent white blood cell count was 2.9. She has not had infectious complications, but did experience a mild viral illness approximately two weeks ago, characterized by nonspecific malaise and absence of fever, which resolved after one week.    She reports increased fatigue, particularly in the mornings after breakfast, often requiring rest or returning to bed. She denies significant chest pain or dyspnea, though occasionally experiences mild shortness of breath associated with sinus drainage. She denies dizziness with positional changes, and bowel function is normal.    In November, she sought emergency evaluation for elevated blood pressure and exacerbation of arthritis symptoms. Creatinine was 1.13 in November, previously 1.68 in August. She continues vitamin D supplementation at a reduced frequency. Her calcium  level in the ER was within normal limits.    She expressed concern regarding insurance changes and continuity of care with her oncology and cardiology providers.    Dec 19, 2022: Follow-up for polycythemia vera; blood counts stable on hydroxyurea  and aspirin , no significant side effects, minimal symptom burden per MPN 10 score. Reported malaise, urine test negative for UTI. Physical exam unremarkable, remains active. Plan to continue current therapy and monitor.    MPN 10 Score  (App: [email or web address])   [email or web address]  (Useful for monitoring patient symptoms.    Symptom Range Pre-Tx 03/13/23 08/21/23   Fatigue in the past 24 hours (Absent) 0 1 2 3 4 5 6 7 8 9  10 (Worst Imaginable) 0 0 1   Filling up quickly when you eat (Early satiety)  (Absent) 0 1 2 3 4 5 6 7 8 9  10 (Worst Imaginable) 0 0 0   Abdominal discomfort  (Absent) 0 1 2 3 4 5 6 7 8 9  10 (Worst Imaginable) 0 0 0   Inactivity (Absent) 0 1 2 3 4 5 6 7 8 9  10 (Worst Imaginable)  1 2-3 0   Problems with concentration - (Absent) 0 1 2 3 4 5 6 7 8 9  10 (Worst Imaginable) 0 0 0   Numbness/ Tingling (in my hands and feet) (Absent) 0 1 2 3 4 5 6 7 8 9  10 (Worst Imaginable) 1 - 2 2 1    Night sweats (Absent) 0 1 2 3 4 5 6 7 8 9  10 (Worst Imaginable)  1 0 0   Itching (pruritus) (Absent) 0 1 2 3 4 5 6 7 8 9  10 (Worst Imaginable) 1 0 0   Bone pain (diffuse not joint pain or arthritis) (Absent) 0 1 2 3 4 5 6 7 8 9  10 (Worst Imaginable) 1 - 3  2 0   Fever (>100 F) (  Absent) 0 1 2 3 4 5 6 7 8 9  10 (Daily) 0 0 0   Unintentional weight loss last 6 months (Absent) 0 1 2 3 4 5 6 7 8 9  10 (Worst Imaginable) With DM meds lost 16 lbs  0 0     5 7 2      PHYSICAL EXAM:  VS: As recorded in Epic  GENERAL:  NAD; she is able to get to the table with minimal assistance  HEENT: OP is clear; C/S clear  LYMPH NODES: No LAN  NECK: No JVD/HJR  LUNGS: Dim   COR: 2/6 sys m; + S4; rrr  ABD:  NTND; no HSM   EXT: No edema    PMHx of pancreatitis, hepatitis, T2DM, atelectasis, acute respiratory failure with hypoxemia, nonischemic cardiomyopathy, HTN, aortic stenosis, heart failure, CAD, essential tremor, osteoarthritis, vertigo, iron deficiency anemia, insomnia, dyslipidemia, Diffuse large B cell lymphoma.              Primary prophylaxis of thrombosis  All patients: Aspirin  unless    VWF activity <30%,   Platelet >?1 million,   CALR-mutated low-risk ET    PV with Hct >?45%: Add phlebotomy/cytoreduction to target Hct <?45%  Age >?60 years and/or prior history of thrombosis: Add cytoreduction  Secondary prophylaxis after thrombotic event  All patients: Cytoreduction  Typical VTE: Consider indefinite VKA for most patients; aspirin  if not on VKA  Atypical VTE: Indefinite VKA  Arterial thrombosis: Aspirin     PCV Risk  Age > 60:    1   No: 0   Yes: 1    History of thrombosis:  0   No: 0   Yes: 1    Low Risk: 0 points  High Risk 1 -2 points    Low Risk patients treat with aspirin  every day;   High Risk patients treat with aspirin  BID*  * Consider BID aspirin  for patients with microvascular complications, additional CV risk factors, or leukocytosis    PV:   Major criteria  Hgb/Hct  Men: >?16.5?g/dL or 50% or inc red blood cell mass  Women: > 16 g/dL or 51% or inc red blood cell mass  Inc in RBC Mass > 25%  Presence of JAK2 or JAK2 exon 12 mutation   BM biopsy showing hypercellularity for age with trilineage growth (panmyelosis) including prominent erythroid, granulocytic and megakaryocytic proliferation with pleomorphic, mature megakaryocytes (differences in size, without atypia)     Minor criteria  Subnormal serum erythropoietin level     Diagnosis: All 3 major or first 2 major and 1 minor

## 2023-12-05 MED ORDER — SPIRONOLACTONE 25 MG TABLET
ORAL_TABLET | Freq: Every day | ORAL | 0 refills | 90.00000 days
Start: 2023-12-05 — End: ?

## 2023-12-05 NOTE — Telephone Encounter (Signed)
 Last RX sent 10/31/2023 for 90-Day with 3 refills to [facility].

## 2023-12-05 NOTE — Progress Notes (Signed)
 IDENTIFICATION: This is a 83 y.o. 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  She was negative for HIV and HBV.  Staging PET-CT has not been ordered.    PROGNOSIS:  A validated prognostic score for follicular lymphoma is the FLIPI score (Solal-Celigny, Blood, 2004), which has been validated in marginal zone lymphoma (Heilgeist, Cancer, 2013). Among 144 patients with MZL, the 5-year PFS for patients who had FLIPI scores of 0 to 2 (low or intermediate risk) was excellent at 92%, whereas it was only 62% for patients who had FLIPI scores of 3 to 5 (poor risk; P = .003). Similarly, the 5-year OS for patients who had FLIPI scores of 0 to 2 was 95%, whereas it was only 62% for patients who had FLIPI scores of 3 to 5 (P = .0009). This patient had the following FLIPI score prognostic factors: age >10, correlating with a low-risk (0-1 points, 10-yr OS 70%) FLIPI score.     A validated prognostic score for MALT lymphoma is the MALT-IPI (Th[place], Blood, 2017). This score is comprised of 3 factors: age >70, elevated LDH, and stage III/IV. This patient has none of these factors and has low risk disease (49yr EFS 70%; 66yr OS 99%).       TREATMENT:  The standard approach for advanced MZL is surveillance unless there are GELF criteria present. The patient is asymptomatic so we will initiate a surveillance protocol. Standard surveillance consists of a history, physical exam and labwork every 4 months for the first year. Additionally, patients should have a CT CAP every 6 months to monitor for disease progression. After the first year, if the disease is stable, the follow-up intervals can be lengthened.     Her TTE 04/12/21 showed EF 35-40% which is 2/2 CHF. She had a positive peripheral blood flow on 02/24/2020, so she likely has a low level of active lymphoma. Recent CTA was neg for LAD and CT in 2021 was also not c/f lymphoma. She had elevated HGB at 17.8 11/08/21. JAK2 mutation+. Refer to [facility] clinic.      PET at last visit was negative for lymphoma. CBC monitored closely by [facility] clinic given for PV. Will move to yearly visits.    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 Pt seen by Dr. Fleeta Ebbing in clinic with plan to start hydrea  500mg  daily. Pt hesitant to start treatment but she was reassured that the benefit outweighs the risk of hydrea .    SUMMARY:  - Diagnosed with orbital MZL in 1999  - Treated with 3-4 months of Cytoxan  - Recurrence of MZL to lower eyelid R eye in 10/2002  - Underwent local radiation to eye. CT scans 11/20/02 revealed no evidence of recurrent tumor  - On leukocytopenia work-up 02/24/20, peripheral blood flow cytometry revealed low-level CD5-negative/CD10-negative b-cell population representing <1% of peripheral WBC's.   - CT CAP  negative in 07/2019  - CTA neg 02/2021  - TTE 04/12/21: EF 35-40%  - Hg 17.8; JAK2+: Dr. Fleeta Ebbing following   -starting hydrea  500mg  daily  - PET (02/14/22): mild increase BM uptake, nonspecific.   - CBC monitored closely by [facility] team given PV  - RTC yearly.    A total of 30 minutes were spent face-to-face and non-face-to-face in the care of this patient, which includes all pre, intra, and post visit time on the date of service.    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    She remains under surveillance for marginal zone lymphoma, with her last recurrence in 2004 treated with local radiation to the right lower eyelid.    She is managed for polycythemia vera with hydroxyurea , with recent minor dose adjustment due to stable blood counts. She has not experienced any significant new symptoms related to her hematologic conditions.    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 Approximately two weeks ago, she experienced mild viral symptoms with transient malaise, particularly in the mornings, which have since resolved.    Feb 14, 2022: Surveillance visit for marginal zone lymphoma; patient remains asymptomatic with low-risk FLIPI and MALT-IPI scores. Recent workup showed mild leukocytopenia and minimal lymphoma activity on flow cytometry, PET scan with mild increased bone marrow uptake likely related to polycythemia vera. No lymphadenopathy or significant symptoms; continues on surveillance protocol with regular follow-ups.    HISTORY OF PRESENT ILLNESS:  [name] is a 83 y.o. Caucasian female who we are seeing in consultation at the request of Dr. Dorenda for further evaluation of leukopenia.  Patient has a h/o orbital marginal zone lymphoma originally diagnosed in 1999. Treated with primary resection and Cytoxan for 3-4 months. Had local recurrence in 10/2002 involving R eye lower eyelid without ocular involvement, treated with local radiation in [place]. Previously followed by [facility], with last visit in 04/2012. Per available records, WBCs have been predominantly in the 2.6-3.6 range with ANCs 1.1-2.3 range and ALCs 0.4-1.5 range dating back to 2010.  Patient presented to [facility] 02/24/20 for follow-up evaluation of leukocytopenia. In work-up, her peripheral blood flow cytometry showed mild lymphopenia with low-level CD5-negative/CD10-negative monotypic B-cell population, representing <1% (23 cells/uL) of the peripheral white blood cells. Given previous history of marginal zone lymphoma, was referred [facility] lymphoma clinic.     She is here with a one point cane accompanied by her son. Patient developed non-drenching night sweats in early 02/2020 and became febrile (110.7) on 02/21/20. She is doing well today. Her BM is normal. 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 She does have some mild tachycardia. She reports having some fluid problems given her CHF hx- manages with fluid pills. Denies fevers, chills, night sweats, nausea, vomiting, diarrhea, chest pain, shortness of breath, cough, early satiety, weight loss, new LAD, or concerning symptoms.     PAST MEDICAL HISTORY:  Past Medical History:   Diagnosis Date    At risk for falls     left ankle arthritis; cane     Coronary artery disease     Diabetes mellitus (CMS-HCC)  Diffuse large B cell lymphoma    (CMS-HCC)     Hypertension     Impaired mobility     left ankle arthritis, cane    Visual impairment     glasses       MEDICATIONS:  Current Outpatient Medications   Medication Sig Dispense Refill    albuterol  HFA 90 mcg/actuation inhaler Inhale 2 puffs every six (6) hours as needed for wheezing. 18 g 2    aspirin  (ECOTRIN) 81 MG tablet Take 1 tablet (81 mg total) by mouth daily.      atorvastatin  (LIPITOR ) 10 MG tablet Take 1 tablet (10 mg total) by mouth daily. 90 tablet 3    blood sugar diagnostic (ACCU-CHEK GUIDE TEST STRIPS) Strp by Other route Two (2) times a day (30 minutes before a meal). 200 each 3    blood-glucose meter kit Use as instructed      cycloSPORINE  0.1 % Drop Apply to eye.      estradiol  (ESTRACE ) 0.01 % (0.1 mg/gram) vaginal cream Insert 2 g into the vagina daily. Use externally prn 42.5 g 11    fenofibrate  (TRICOR ) 145 MG tablet Take 1 tablet by mouth once daily 90 tablet 0    fluticasone  propionate (FLONASE ) 50 mcg/actuation nasal spray USE ONE (1) SPRAY IN EACH NOSTRIL ONCE DAILY 16 g 0    furosemide  (LASIX ) 20 MG tablet Take 1 tablet (20 mg total) by mouth daily as needed for swelling. 30 tablet 3    hydroxyurea  (HYDREA ) 500 mg capsule Take 1 capsule (500 mg total) by mouth two (2) times a day Monday- Thursday AND 1 capsule (500 mg total) daily Friday- Sunday. (Patient taking differently: Take 1 capsule (500 mg total) by mouth two (2) times a day Monday,  1 capsule (500 mg total) daily Thurs- Sunday.) 150 capsule 11    lancets (ACCU-CHEK SOFTCLIX LANCETS) Misc USE ONE (1) LANCET TO CHECK GLUCOSE TWICE DAILY 100 each 3    lancing device Misc USE TO CHECK BLOOD SUGAR 2 TIMES A DAY      LORazepam  (ATIVAN ) 0.5 MG tablet TAKE 1/2 (ONE-HALF) TABLET BY MOUTH TWICE DAILY AS NEEDED FOR ANXIETY 30 tablet 0    losartan  (COZAAR ) 50 MG tablet Take 1 tablet (50 mg total) by mouth two (2) times a day. 180 tablet 1    metoPROLOL  succinate (TOPROL -XL) 50 MG 24 hr tablet TAKE 1 TABLET BY MOUTH AT BEDTIME 90 tablet 2    sitagliptin phos-metFORMIN  (JANUMET  XR) 50-500 mg TM24 Take 2 tablets by mouth daily. 180 tablet 1    spironolactone  (ALDACTONE ) 25 MG tablet Take 1 tablet (25 mg total) by mouth daily. 90 tablet 3    traMADol  (ULTRAM ) 50 mg tablet Take 0.5 tablets (25 mg total) by mouth daily as needed for pain or severe pain. 15 tablet 0     No current facility-administered medications for this visit.       ALLERGIES:  Allergies   Allergen Reactions    Amoxicillin       Other Reaction(s): Unknown    Doxycycline  Other (See Comments)     Makes her nervous and shaky.    Cephalexin Palpitations    Ciprofloxacin Palpitations     Legacy System: Delight    Onset Date: <blank>    Substance Legacy/Cerner: Cipro / Cipro (Legacy value)    Category: Drug    Severity Legacy/Cerner: <blank> / Unknown    Reaction(s): makes head feel funny  nausea    Comments: <blank>  Hydrocodone-Acetaminophen  Nausea And Vomiting and Other (See Comments)    Sulfamethoxazole Palpitations       SOCIAL HISTORY:  Social History     Socioeconomic History    Marital status: Widowed   Tobacco Use    Smoking status: Never    Smokeless tobacco: Never   Vaping Use    Vaping status: Never Used   Substance and Sexual Activity    Alcohol use: Not Currently    Drug use: Never    Sexual activity: Not Currently     Partners: Male     Social Drivers of Health     Food Insecurity: No Food Insecurity (06/20/2023)    Hunger Vital Sign     Worried About Running Out of Food in the Last Year: Never true     Ran Out of Food in the Last Year: Never true   Tobacco Use: Low Risk (11/22/2023)    Patient History     Smoking Tobacco Use: Never     Smokeless Tobacco Use: Never   Transportation Needs: No Transportation Needs (06/20/2023)    PRAPARE - Transportation     Lack of Transportation (Medical): No     Lack of Transportation (Non-Medical): No   Alcohol Use: Not At Risk (06/20/2023)    Alcohol Use     How often do you have a drink containing alcohol?: Never     How often do you have 5 or more drinks on one occasion?: Never   Housing: Low Risk (06/20/2023)    Housing     Within the past 12 months, have you ever stayed: outside, in a car, in a tent, in an overnight shelter, or temporarily in someone else's home (i.e. couch-surfing)?: No     Are you worried about losing your housing?: No   Physical Activity: Insufficiently Active (06/20/2023)    Exercise Vital Sign     Days of Exercise per Week: 3 days     Minutes of Exercise per Session: 40 min   Utilities: Low Risk (06/20/2023)    Utilities     Within the past 12 months, have you been unable to get utilities (heat, electricity) when it was really needed?: No   Stress: No Stress Concern Present (06/20/2023)    [employer] of Occupational Health - Occupational Stress Questionnaire     Feeling of Stress: Not at all   Interpersonal Safety: Not At Risk (06/20/2023)    Interpersonal Safety     Unsafe Where You Currently Live: No     Physically Hurt by Anyone: No     Abused by Anyone: No   Substance Use: Low Risk (06/20/2023)    Substance Use     In the past year, how often have you used prescription drugs for non-medical reasons?: Never     In the past year, how often have you used illegal drugs?: Never     In the past year, have you used any substance for non-medical reasons?: No   Social Connections: Moderately Integrated (06/20/2023)    Social Connection and Isolation Panel     Frequency of Communication with Friends and Family: More than three times a week     Frequency of Social Gatherings with Friends and Family: Three times a week     Attends Religious Services: More than 4 times per year     Active Member of Clubs or Organizations: Yes     Attends Banker Meetings: More than 4 times per year  Marital Status: Widowed   Physicist, Medical Strain: Low Risk (06/20/2023)    Overall Financial Resource Strain (CARDIA)     Difficulty of Paying Living Expenses: Not hard at all   Health Literacy: Low Risk (06/20/2023)    Health Literacy     : Never   Internet Connectivity: No Internet connectivity concern identified (06/20/2023)    Internet Connectivity     Do you have access to internet services: Yes     How do you connect to the internet: Personal Device at home     Is your internet connection strong enough for you to watch video on your device without major problems?: Yes     Do you have enough data to get through the month?: Yes     Does at least one of the devices have a camera that you can use for video chat?: Yes       FAMILY HISTORY:  Family History   Problem Relation Age of Onset    Cancer Other     Diabetes Mother     Asthma Mother     Hypertension Father     Stroke Father        REVIEW OF SYSTEMS:  See HPI. A 10 system ROS is otherwise negative.    VITAL SIGNS:   There were no vitals filed for this visit.        EXAM:  ECOG: 1  CONST: NAD, Awake, Alert  HEENT: Oropharynx clear.   LYMPH: No cervical, supraclavicular, axillary, or inguinal LAD  RESP: Clear to auscultation bilaterally.  CV: Regular rate and rhythm. No rubs, gallops or murmurs.   GI: Soft, nontender, nondistended. No hepatosplenomegaly.   MSK: Trace edema to b/l LE. L ankle brace.   NEURO: No focal deficits    LABORATORY:  Lab on 12/04/2023   Component Date Value Ref Range Status    Sodium 12/04/2023 143  135 - 145 mmol/L Final    Potassium 12/04/2023 5.3 (H)  3.4 - 4.8 mmol/L Final    Chloride 12/04/2023 107  98 - 107 mmol/L Final    CO2 12/04/2023 25.0  20.0 - 31.0 mmol/L Final    Anion Gap 12/04/2023 11  5 - 14 mmol/L Final    BUN 12/04/2023 32 (H)  9 - 23 mg/dL Final    Creatinine 87/96/7974 1.34 (H)  0.55 - 1.02 mg/dL Final    BUN/Creatinine Ratio 12/04/2023 24   Final    eGFR CKD-EPI (2021) Female 12/04/2023 39 (L)  >=60 mL/min/1.15m2 Final    eGFR calculated with CKD-EPI 2021 equation in accordance with [employer] and [employer] of Nephrology Task Force recommendations.    Glucose 12/04/2023 157  70 - 179 mg/dL Final    Calcium  12/04/2023 9.7  8.7 - 10.4 mg/dL Final    Albumin 87/96/7974 4.2  3.4 - 5.0 g/dL Final    Total Protein 12/04/2023 7.5  5.7 - 8.2 g/dL Final    Total Bilirubin 12/04/2023 0.4  0.3 - 1.2 mg/dL Final    AST 87/96/7974 44 (H)  <=34 U/L Final    ALT 12/04/2023 36  10 - 49 U/L Final    Alkaline Phosphatase 12/04/2023 31 (L)  46 - 116 U/L Final    LDH 12/04/2023 222  120 - 246 U/L Final    WBC 12/04/2023 2.9 (L)  3.6 - 11.2 10*9/L Final    RBC 12/04/2023 3.58 (L)  3.95 - 5.13 10*12/L Final    HGB 12/04/2023 12.9  11.3 - 14.9 g/dL Final    HCT 87/96/7974 39.4  34.0 - 44.0 % Final    MCV 12/04/2023 110.0 (H)  77.6 - 95.7 fL Final    MCH 12/04/2023 36.0 (H)  25.9 - 32.4 pg Final    MCHC 12/04/2023 32.7  32.0 - 36.0 g/dL Final    RDW 87/96/7974 13.6  12.2 - 15.2 % Final    MPV 12/04/2023 8.8  6.8 - 10.7 fL Final    Platelet 12/04/2023 174  150 - 450 10*9/L Final    Neutrophils % 12/04/2023 57.5  % Final    Lymphocytes % 12/04/2023 31.7  % Final    Monocytes % 12/04/2023 9.5  % Final    Eosinophils % 12/04/2023 0.1  % Final    Basophils % 12/04/2023 1.2  % Final    Absolute Neutrophils 12/04/2023 1.6 (L)  1.8 - 7.8 10*9/L Final    Absolute Lymphocytes 12/04/2023 0.9 (L)  1.1 - 3.6 10*9/L Final    Absolute Monocytes 12/04/2023 0.3  0.3 - 0.8 10*9/L Final    Absolute Eosinophils 12/04/2023 0.0  0.0 - 0.5 10*9/L Final    Absolute Basophils 12/04/2023 0.0  0.0 - 0.1 10*9/L Final    Macrocytosis 12/04/2023 Slight (A)  Not Present Final       IMAGING:  PET 02/14/22  1. Mildly diffusely increased bone marrow uptake, which is a highly nonspecific finding. While this could represent some degree of lymphoma involvement, could also be seen with other conditions such as hypoxia.    2. Otherwise, no evidence of FDG avid lymphoma    CTA chest w/o contrast 07/28/19:   -No pulmonary embolism.  -Multifocal groundglass opacities with mild interlobular septal thickening and trace pleural effusions likely pulmonary edema.     MRI abdomen 07/21/19:   -Sequela of cholecystectomy. No evidence of retained biliary ductal stone or stricture.  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    PATHOLOGY:  Peripheral blood flow cytometry 02/24/20:   Diagnosis   Peripheral blood, smear review and flow cytometry  -  Lymphopenia with low-level CD5-negative/CD10-negative monotypic B-cell population identified, representing <1% (23 cells/uL) of the peripheral white blood cells by flow cytometric analysis (See Comment)     This electronic signature is attestation that the pathologist personally reviewed the submitted material(s) and the final diagnosis reflects that evaluation.   Electronically signed by Corean Zulema Hoots, MD on 02/26/2020 at 1058   Diagnosis Comment    While the significance of the numerically small CD5-negative/CD10-negative monotypic B-cell population identified in the peripheral blood is uncertain, this likely represents low-level involvement by the patient's reported previously diagnosed marginal zone lymphoma. Clinical and radiographic correlation is necessary for further characterization.

## 2023-12-07 NOTE — ED Progress Note (Addendum)
[facility] ED Post-Discharge Call    5 or more prescribed daily medications    Contacted patient at [phone] to discuss ED visit. Confirmed patient identity.     We want to ensure you understood your plan of care. Review AVS instructions. Did your discharge instructions answer all of your questions? yes  Have you made a follow-up appointment? yes  Have you filled your prescriptions (if applicable)? N/A  Do you have any questions regarding your prescriptions?N/A  Do you have any other questions? No  Return precautions discussed.

## 2023-12-09 DIAGNOSIS — I10 Essential (primary) hypertension: Principal | ICD-10-CM

## 2023-12-09 MED ORDER — SPIRONOLACTONE 25 MG TABLET
ORAL_TABLET | Freq: Every day | ORAL | 0 refills | 0.00000 days
Start: 2023-12-09 — End: ?

## 2023-12-10 MED ORDER — SPIRONOLACTONE 25 MG TABLET
ORAL_TABLET | Freq: Every day | ORAL | 0 refills | 90.00000 days
Start: 2023-12-10 — End: ?

## 2023-12-10 NOTE — Telephone Encounter (Signed)
 Filled by cardiology

## 2023-12-12 ENCOUNTER — Ambulatory Visit

## 2023-12-12 ENCOUNTER — Ambulatory Visit
Admission: EM | Admit: 2023-12-12 | Discharge: 2023-12-12 | Disposition: A | Discharge status: Home / Self Care | Source: Home / Self Care

## 2023-12-12 DIAGNOSIS — R051 Acute cough: Secondary | ICD-10-CM | POA: Diagnosis not present

## 2023-12-12 DIAGNOSIS — R062 Wheezing: Secondary | ICD-10-CM

## 2023-12-12 DIAGNOSIS — R0602 Shortness of breath: Secondary | ICD-10-CM

## 2023-12-12 DIAGNOSIS — J209 Acute bronchitis, unspecified: Secondary | ICD-10-CM

## 2023-12-12 MED ORDER — BENZONATATE 200 MG PO CAPS: 200.0000 mg | ORAL_CAPSULE | Freq: Three times a day (TID) | ORAL | 0 refills | Status: AC | PRN

## 2023-12-12 MED ORDER — IPRATROPIUM-ALBUTEROL 0.5-2.5 (3) MG/3ML IN SOLN
3.0000 mL | Freq: Once | RESPIRATORY_TRACT | Status: AC
  Administered 2023-12-12: 3 mL via RESPIRATORY_TRACT

## 2023-12-12 MED ORDER — PREDNISONE 10 MG PO TABS: 30.0000 mg | ORAL_TABLET | Freq: Every day | ORAL | 0 refills | Status: AC

## 2023-12-12 MED ORDER — AZITHROMYCIN 250 MG PO TABS: 250.0000 mg | ORAL_TABLET | Freq: Every day | ORAL | 0 refills | Status: DC

## 2023-12-12 NOTE — Progress Notes (Signed)
 Specialty Medication(s): Hydrea     [name] has been dis-enrolled from the [facility] specialty pharmacy services as a result of patient's medication is no longer on the [facility] Specialty Drug List..    Additional information provided to the patient: n/a    Cindy Gonzalez  [facility] Specialty Pharmacist

## 2023-12-12 NOTE — ED Provider Notes (Signed)
 [facility]    CSN: [id number] Arrival date & time: 12/12/23  9187      History   Chief Complaint Chief Complaint  Patient presents with   Cough    HPI Jacqueline Arroyo is a 83 y.o. female with history of type II diabetes, hypertension, mucosa associated lymphoid tissue, osteoarthritis, essential tremors, allergies, heart failure with midrange ejection fraction, coronary artery disease, and polycythemia.  Patient presents by herself to urgent care for cough and congestion x 5 days.  Reports feeling short of breath today.  Denies fever.  Has had nasal congestion and sinus pressure.  Denies chest pain, abdominal pain, vomiting or diarrhea.  No sick contacts.  Patient says she lives alone.  She has been taking OTC meds.  She reports that she has oxygen at home but does not usually need to use it.  No other complaints.  HPI  Past Medical History:  Diagnosis Date   Diabetes mellitus without complication (HCC)    Hypertension     Patient Active Problem List   Diagnosis Date Noted   Left ear pain 05/22/2023   Acute recurrent pansinusitis 05/22/2023    Past Surgical History:  Procedure Laterality Date   ABDOMINAL HYSTERECTOMY     BACK SURGERY     CHOLECYSTECTOMY     FOOT SURGERY      OB History   No obstetric history on file.      Home Medications    Prior to Admission medications  Medication Sig Start Date End Date Taking? Authorizing Provider  azithromycin  (ZITHROMAX ) 250 MG tablet Take 1 tablet (250 mg total) by mouth daily. Take first 2 tablets together, then 1 every day until finished. 12/12/23  Yes Arvis Huxley B, PA-C  benzonatate  (TESSALON ) 200 MG capsule Take 1 capsule (200 mg total) by mouth 3 (three) times daily as needed for cough. 12/12/23  Yes Arvis Huxley NOVAK, PA-C  predniSONE  (DELTASONE ) 10 MG tablet Take 3 tablets (30 mg total) by mouth daily for 5 days. 12/12/23 12/17/23 Yes Arvis Huxley B, PA-C  acetic acid 2 % otic solution SMARTSIG:Left  Ear 01/19/21   [provider]  albuterol (VENTOLIN HFA) 108 (90 Base) MCG/ACT inhaler Inhale into the lungs. 07/12/19   [provider]  amLODipine (NORVASC) 10 MG tablet Take 10 mg by mouth daily. 09/01/20   [provider]  aspirin 81 MG EC tablet Take by mouth. 07/17/19   [provider]  atorvastatin (LIPITOR) 10 MG tablet Take 1 tablet by mouth daily. 03/01/20   [provider]  calcium -vitamin D (OSCAL WITH D) 500-200 MG-UNIT TABS tablet Take by mouth.    [provider]  Cyanocobalamin  2000 MCG TBCR Take by mouth.    [provider]  cycloSPORINE (RESTASIS) 0.05 % ophthalmic emulsion INSTILL 1 DROP INTO EACH EYE TWICE DAILY 01/29/20   [provider]  estradiol (ESTRACE) 0.1 MG/GM vaginal cream SMARTSIG:Gram(s) Vaginal 3 Times a Week 08/17/20   [provider]  fenofibrate (TRICOR) 145 MG tablet Take by mouth. 06/27/20   [provider]  FLOVENT  HFA 110 MCG/ACT inhaler SMARTSIG:2 Puff(s) By Mouth Twice Daily 05/09/20   [provider]  fluticasone  (FLONASE ) 50 MCG/ACT nasal spray 1 spray into each nostril daily. 12/27/20 05/22/23  [provider]  furosemide (LASIX) 20 MG tablet Take by mouth. 07/23/19   [provider]  hydroxyurea (HYDREA) 500 MG capsule Take by mouth. 02/26/22   [provider]  ketoconazole (NIZORAL) 2 % cream Apply  topically daily. 03/11/20   [provider]  Lancets (FREESTYLE) lancets Check blood sugar twice daily 12/27/20   [provider]  lisinopril (ZESTRIL) 30 MG tablet Take 1 tablet by mouth daily. 12/29/20 05/22/23  [provider]  LORazepam (ATIVAN) 0.5 MG tablet Take by mouth. 07/31/19   [provider]  meclizine (ANTIVERT) 12.5 MG tablet Take 12.5 mg by mouth 2 (two) times daily as needed. 01/18/21   [provider]  meloxicam (MOBIC) 7.5 MG tablet Take 1 tablet by mouth daily. 01/26/20   [provider]  metFORMIN (GLUCOPHAGE) 500 MG tablet Take 500 mg by mouth daily. 08/31/20   [provider]  methocarbamol  (ROBAXIN ) 500 MG tablet Take 1 tablet (500 mg total) by mouth 2 (two) times daily. 01/14/22   Brimage, Vondra, DO  metoprolol succinate (TOPROL-XL) 50 MG 24 hr tablet Take 50 mg by mouth 2 (two) times daily. 09/08/19   [provider]  mupirocin  cream (BACTROBAN ) 2 % Apply 1 Application topically 2 (two) times daily. 07/07/22   Antonette Angeline ORN, NP  spironolactone (ALDACTONE) 25 MG tablet Take 25 mg by mouth daily. 08/06/20   [provider]  triamcinolone  cream (KENALOG ) 0.1 % Apply 1 Application topically 2 (two) times daily. 07/07/22   Antonette Angeline ORN, NP  UNABLE TO FIND Med Name: HCTZ 10 mg    [provider]    Family History History reviewed. No pertinent family history.  Social History Social History[1]   Allergies   Amoxicillin , Cephalexin, Ciprofloxacin, Hydrocodone-acetaminophen, and Sulfamethoxazole   Review of Systems Review of Systems  Constitutional:  Positive for fatigue. Negative for chills, diaphoresis and fever.  HENT:  Positive for congestion and rhinorrhea. Negative for ear pain, sinus pain and sore throat.   Respiratory:  Positive for cough, shortness of breath and wheezing.   Cardiovascular:  Negative for chest pain.  Gastrointestinal:  Negative for abdominal pain, nausea and vomiting.  Musculoskeletal:  Negative for myalgias.  Skin:  Negative for rash.  Neurological:  Negative for headaches.     Physical Exam Triage Vital Signs ED Triage Vitals  Encounter Vitals Group     BP 12/12/23 0838 (!) 157/72     Girls Systolic BP Percentile --      Girls Diastolic BP Percentile --      Boys Systolic BP Percentile --      Boys Diastolic BP Percentile --      Pulse Rate 12/12/23 0838 86     Resp 12/12/23 0838 18     Temp 12/12/23 0838 97.8 F (36.6 C)     Temp Source 12/12/23 0838 Oral     SpO2 12/12/23 0838 94  %     Weight 12/12/23 0838 177 lb 12.8 oz (80.6 kg)     Height --      Head Circumference --      Peak Flow --      Pain Score 12/12/23 0837 0     Pain Loc --      Pain Education --      Exclude from Growth Chart --    No data found.  Updated Vital Signs BP (!) 157/72 (BP Location: Left Arm)   Pulse (!) 114   Temp 97.8 F (36.6 C) (Oral)   Resp 18   Wt 177 lb 12.8 oz (80.6 kg)   SpO2 93%   BMI 32.52 kg/m      Physical Exam Vitals and nursing note reviewed.  Constitutional:  General: She is not in acute distress.    Appearance: Normal appearance. She is ill-appearing. She is not toxic-appearing.  HENT:     Head: Normocephalic and atraumatic.     Nose: Congestion present.     Mouth/Throat:     Mouth: Mucous membranes are moist.     Pharynx: Oropharynx is clear.  Eyes:     General: No scleral icterus.       Right eye: No discharge.        Left eye: No discharge.     Conjunctiva/sclera: Conjunctivae normal.  Cardiovascular:     Rate and Rhythm: Normal rate and regular rhythm.     Heart sounds: Normal heart sounds.  Pulmonary:     Effort: Respiratory distress present.     Breath sounds: Wheezing and rhonchi present.     Comments: Mildly distressed with initial increased respiratory rate.  Speaking in full sentences, but slowly. Musculoskeletal:     Cervical back: Neck supple.  Skin:    General: Skin is dry.  Neurological:     General: No focal deficit present.     Mental Status: She is alert. Mental status is at baseline.     Motor: No weakness.     Gait: Gait normal.  Psychiatric:        Mood and Affect: Mood normal.        Behavior: Behavior normal.      UC Treatments / Results  Labs (all labs ordered are listed, but only abnormal results are displayed) Labs Reviewed - No data to display  EKG   Radiology DG Chest 2 View Result Date: 12/12/2023 EXAM: XR Chest, 2 View(s). CLINICAL HISTORY: Cough and congestion x 4-5 days. SOB today.  COMPARISON: 11/02/2022 FINDINGS: LUNGS: The lungs are clear without evidence for focal consolidation or pulmonary edema. PLEURAL SPACES: No pleural effusion or pneumothorax. HEART: The cardiopericardial silhouette is normal for size. BONES: No acute osseous abnormality. IMPRESSION: 1. No acute cardiopulmonary disease. Electronically signed by: Camellia Candle MD 12/12/2023 09:42 AM EST RP Workstation: [id number]    Procedures Procedures (including critical care time)  Medications Ordered in UC Medications  ipratropium-albuterol (DUONEB) 0.5-2.5 (3) MG/3ML nebulizer solution 3 mL (3 mLs Nebulization Given 12/12/23 0912)    Initial Impression / Assessment and Plan / UC Course  I have reviewed the triage vital signs and the nursing notes.  Pertinent labs & imaging results that were available during my care of the patient were reviewed by me and considered in my medical decision making (see chart for details).   83 year old female with history of heart disease, hypertension, type 2 diabetes presents for cough and congestion x 5 days with onset of shortness of breath today.  No associated fever.  BP elevated 157/72.  Oxygen is 94%.  Mildly distressed with initial increased respiratory rate.  Speaking in full sentences, but slowly.  Has nasal congestion.  Diffuse wheezing and rhonchi throughout all lung fields.  Chest x-ray obtained to evaluate for possible underlying pneumonia.  Patient given DuoNeb treatment.  This helped breathing.  Oxygen stayed 93 to 94%.  Pulse increased to 114 bpm.  Chest x-ray negative for pneumonia.  Acute bronchitis.  Supportive care encouraged with increasing rest and fluids.  Sent azithromycin , prednisone  and benzonatate  to pharmacy.  Encouraged her to use albuterol home as well as home oxygen if needed.  Encouraged her to call 911 or go immediately to the ER if breathing is not improving in the next couple days, she develops  fever, chest pain or has any worsening of  symptoms.  Patient is agreeable.   Final Clinical Impressions(s) / UC Diagnoses   Final diagnoses:  Shortness of breath  Acute bronchitis, unspecified organism  Acute cough  Wheezing     Discharge Instructions      - Your x-ray was negative for pneumonia. - You have bronchitis.  I sent a medium to low-dose prednisone , azithromycin  and cough medicine to pharmacy.  Use oxygen at home as needed and your inhaler.  Increase rest and fluids. - If you not feeling better in the next couple of days, breathing worsens, you develop a fever, chest pain please call 911 or go immediately to the ER.     ED Prescriptions     Medication Sig Dispense Auth. Provider   azithromycin  (ZITHROMAX ) 250 MG tablet Take 1 tablet (250 mg total) by mouth daily. Take first 2 tablets together, then 1 every day until finished. 6 tablet Arvis Huxley B, PA-C   predniSONE  (DELTASONE ) 10 MG tablet Take 3 tablets (30 mg total) by mouth daily for 5 days. 15 tablet Arvis Huxley B, PA-C   benzonatate  (TESSALON ) 200 MG capsule Take 1 capsule (200 mg total) by mouth 3 (three) times daily as needed for cough. 30 capsule Arvis Huxley NOVAK, PA-C      PDMP not reviewed this encounter.     [1]  Social History Tobacco Use   Smoking status: Never   Smokeless tobacco: Never  Vaping Use   Vaping status: Never Used  Substance Use Topics   Alcohol use: Never   Drug use: Never     Arvis Huxley NOVAK, PA-C 12/12/23 0957

## 2023-12-12 NOTE — Discharge Instructions (Signed)
-   Your x-ray was negative for pneumonia. - You have bronchitis.  I sent a medium to low-dose prednisone , azithromycin  and cough medicine to pharmacy.  Use oxygen at home as needed and your inhaler.  Increase rest and fluids. - If you not feeling better in the next couple of days, breathing worsens, you develop a fever, chest pain please call 911 or go immediately to the ER.

## 2023-12-12 NOTE — ED Triage Notes (Signed)
 Pt present cough with congestion and difficulty breathing, symptoms started five days ago.  Pt tried OTC medication with some relief.

## 2023-12-15 ENCOUNTER — Ambulatory Visit: Admit: 2023-12-15 | Discharge: 2023-12-16 | Payer: Medicare (Managed Care)

## 2023-12-15 ENCOUNTER — Inpatient Hospital Stay: Admit: 2023-12-15 | Discharge: 2023-12-16 | Payer: Medicare (Managed Care)

## 2023-12-15 NOTE — H&P (Signed)
 ------------------------------------------------------------------------------- Attestation signed by Joesph Prentice SQUIBB, MD at 12/16/23 1359 I saw and evaluated the patient on 12/16/2023, participating in the key portions of the service. I reviewed the residents note. I agree with the residents findings and plan, with any exceptions as noted below.  83 y.o. 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 Oxygen saturations borderline at rest and low with exertion but maintaining appropriate saturations on 2L [name]. (Not sure why she has oxygen available at home.) Agree this seems like bronchospasm due to asthma likely triggered by viral infection, rather than bacterial pneumonia or exacerbation of heart failure.  If able to walk around at least enough to take care of herself without needing more than 2L [name], could go home as soon as this afternoon.  ------------ Prentice SQUIBB Joesph, MD PhD Hospital Medicine  -------------------------------------------------------------------------------  Internal Medicine (MEDM) History & Physical  Assessment & Plan:  Jacqueline Arroyo is a 83 y.o. female who is presenting to [facility] with Acute bronchitis due to human metapneumovirus, in the setting of the following pertinent/contributing co-morbidities: HFmrEF (35-40%), HTN, CAD, T2DM, marginal zone lymphoma, polycythemia vera, essential tremor.  Principal Problem:   Acute bronchitis due to human metapneumovirus Active Problems:   Hypertension   Heart failure with mid-range ejection fraction (CMS-HCC)   Essential tremor   Extranodal marginal zone B-cell lymphoma (CMS-HCC)   Leukopenia   Diabetes mellitus (CMS-HCC)   Polycythemia vera (CMS-HCC)   On home oxygen  therapy   Active Problems C/f  asthma exacerbation  (+) Metapneumovirus  About 1 week and a half of URI symptoms with acute on chronic worsening of symptoms in the past 2 days.  Patient presented to the ED after she had noticeable wheezing, hypoxia to the 70s at home.  Denies fever, chills.  Has known heart failure, but denies orthopnea, worsening dyspnea, lower extremity edema.  Pro BNP mildly elevated at 1k.  CT chest showed no consolidation consistent with pneumonia; has persistent nonspecific patchy GGO in RUL which was present on CT chest in January 2025.  Exam notable for bilateral expiratory wheezing.  Denies any smoking history.  Patient is on room air.  Presentation most consistent with mild asthma exacerbation in the setting of URI.  Will offer supportive care and continue to monitor. -S/p azithromycin  500 mg x 1 - Start scheduled albuterol  nebs - Cont prednisone  40 mg daily - Albuterol  nebulizer q4 PRN - Tylenol  650 mg q6 PRN - Guaifenesin , dextromethorphan q4 PRN - SpO2 checks q4  HFmrEF (EF 35-40%) Follows with Dr. [place].  TTE 07/2023 EF 35-40%, stable compared to prior. EDW ~170 pounds.  proBNP mildly elevated at 1k.  History not consistent with heart failure exacerbation.  Imaging and exam not consistent with pulmonary edema or volume overload. -S/p IV Lasix  40 mg x 1 -Continue metoprolol  succinate 50 mg daily - Continue spironolactone 25 mg daily - Hold Lasix  20 mg daily PRN - Hold losartan 50 mg twice daily  The patient's presentation is complicated by the following clinically significant conditions requiring additional evaluation and treatment: - Chronic heart failure POA requiring further investigation, treatment, or monitoring - Hypoxia requiring further investigation, treatment, or monitoring - Coronary Artery Disease POA requiring further investigation, treatment, or monitoring   Chronic Problems  T2DM: Hold home [place].  Last A1c 6.7.  Start glucose  checks twice  daily while on prednisone . Polycythemia vera: Continue home Hydrea; 500 mg twice daily Monday through Thursday, 500 mg daily Friday through Sunday. Anxiety: Continue Ativan  0.5 mg twice daily CAD: Continue aspirin  81 mg daily HLD: Continue home Lipitor 10 mg daily, fenofibrate 145 mg daily  Checklist: Diet: Regular Diet DVT PPx: Lovenox 40mg  q24h Code Status: DNR and DNI Dispo: Patient appropriate for Observation based on expectation at time of admission that period of observation will be less than 30 hours  Team Contact Information:  Primary Team: Internal Medicine (MEDM) Primary Resident: [name], MD Resident's Pager: [phone] [facility])  Chief Concern:  Acute bronchitis due to human metapneumovirus   Subjective:  Jacqueline Arroyo is a 83 y.o. female with pertinent PMHx of HFmrEF (35-40%), HTN, CAD, T2DM, marginal zone lymphoma, polycythemia vera, essential tremor. presenting with wheezing, hypoxia, cough.   History obtained by patient, patient's daughter and daughter-in-law.   HPI: History of Present Illness Jacqueline Arroyo is an 83 year old female with diffuse large B cell lymphoma and congestive heart failure who presents with worsening respiratory symptoms and low oxygen levels.  Respiratory symptoms - Progressive light cough for one week, now worsening - Significant congestion and wheezing, especially on waking - Wheezing present for past 2 days - Loose cough with difficult-to-expectorate, occasionally gray sputum - Associated runny nose and nasal congestion - No current sore throat or headaches - No fever or chills - Sore throat from coughing has resolved - No improvement with azithromycin , prednisone , and tessalon  perles prescribed for bronchitis on 12/11  Hypoxemia - Oxygen saturation at home fell to 73% off supplemental oxygen today  - Requires supplemental oxygen more consistently over the past two nights - Typically uses oxygen PRN for  congestive heart failure, occasionally wears 2L [place]  Cardiac symptoms and congestive heart failure - Congestive heart failure with known ejection fraction of 35% - Mild exertional shortness of breath - Occasional palpitations - Mild leg swelling controlled with diuretics, not worse than usual - No chest pain - No orthopnea  Medication use - Takes losartan for heart failure - Added Mucinex  for current illness - No recent medication changes  Functional status - Lives independently in a senior apartment - Manages own household tasks    ED course VS: 36.8 C, HR 82, 156/82, 90% on RA Labs: Lactate 2.8, WBC 18.4, Hb 12.5, proBNP 1020, D-dimer 902, creatinine 1.07 Micro: RPP (+) metapneumovirus, group A strep (-), UA (-) nitrites, LE, WBC EKG: NSR with first-degree AV block, LBBB Imaging: CXR with bibasilar atelectasis, mild right hemidiaphragm.  CTA chest (-) PE but evidence of remote/chronic emboli, (-) consolidation, patchy GGO's in RUL, mild bronchial thickening. Interventions: Mg 2 g x 1, DuoNeb x 1, IV Lasix  40 mg x 1, 500 cc NS x 1    Pertinent Surgical Hx None  Pertinent Family Hx None  Pertinent Social Hx  Lives alone in independent living. Uses cane and rollator for ambulation.   Allergies Amoxicillin , Doxycycline , Cephalexin, Ciprofloxacin, Hydrocodone-acetaminophen , and Sulfamethoxazole  I reviewed the Medication List. The current list is Accurate Prior to Admission medications  Medication Dose, Route, Frequency  albuterol  HFA 90 mcg/actuation inhaler 2 puffs, Inhalation, Every 6 hours PRN  aspirin  (ECOTRIN) 81 MG tablet 81 mg, Oral, Daily (standard)  atorvastatin  (LIPITOR) 10 MG tablet 10 mg, Oral, Daily (standard)  blood sugar diagnostic (ACCU-CHEK GUIDE TEST STRIPS) Strp Other, 2 times a day (AC)  blood-glucose meter kit Use as instructed  cycloSPORINE  0.1 % Drop Apply to eye.  estradiol (ESTRACE) 0.01 % (0.1 mg/gram) vaginal cream 2 g, Vaginal, Daily  (standard), Use externally prn  fenofibrate (TRICOR) 145 MG tablet 145 mg, Oral, Daily (standard)  fluticasone  propionate (FLONASE ) 50 mcg/actuation nasal spray USE ONE (1) SPRAY IN EACH NOSTRIL ONCE DAILY  furosemide  (LASIX ) 20 MG tablet 20 mg, Oral, Daily PRN  hydroxyurea (HYDREA) 500 mg capsule Take 1 capsule (500 mg total) by mouth two (2) times a day Monday- Thursday AND 1 capsule (500 mg total) daily Friday- Sunday. Patient taking differently: Take 1 capsule (500 mg total) by mouth two (2) times a day Monday,  1 capsule (500 mg total) daily Thurs- Sunday.  lancets (ACCU-CHEK SOFTCLIX LANCETS) Misc USE ONE (1) LANCET TO CHECK GLUCOSE TWICE DAILY  lancing device Misc USE TO CHECK BLOOD SUGAR 2 TIMES A DAY  LORazepam  (ATIVAN ) 0.5 MG tablet TAKE 1/2 (ONE-HALF) TABLET BY MOUTH TWICE DAILY AS NEEDED FOR ANXIETY  losartan (COZAAR) 50 MG tablet 50 mg, Oral, 2 times a day (standard)  metoPROLOL  succinate (TOPROL -XL) 50 MG 24 hr tablet 50 mg, Oral, Nightly  sitagliptin phos-metFORMIN (JANUMET XR) 50-500 mg TM24 2 tablets, Oral, Daily (standard)  spironolactone (ALDACTONE) 25 MG tablet 25 mg, Oral, Daily (standard)  traMADol  (ULTRAM ) 50 mg tablet 25 mg, Oral, Daily PRN    Designated Healthcare Decision Maker: Ms. Dinger currently has decisional capacity for healthcare decision-making and is able to designate a surrogate healthcare decision maker. Ms. Goering designated healthcare decision maker(s) is/are [name] Shaw, Izzabelle Bouley (the patient's adult child) as denoted by stated patient preference.  Objective:  Physical Exam: Temp:  [36.8 C (98.2 F)] 36.8 C (98.2 F) Pulse:  [82-94] 94 SpO2 Pulse:  [92] 92 Resp:  [20-26] 26 BP: (138-156)/(82-89) 138/89 SpO2:  [95 %-98 %] 95 %  Gen: NAD, converses, non-toxic appearing Eyes: Sclera anicteric, EOMI grossly normal  HENT: Atraumatic, normocephalic Heart: RRR Lungs: Expiratory wheezing diffusely, breathing comfortably on RA Abdomen:  Soft, NTND Extremities: Trace edema, wwp Neuro: Grossly symmetric, non-focal, conversant Skin:  No rashes, lesions on clothed exam Psych: Alert, oriented x3

## 2023-12-16 MED ORDER — ASPIRIN 81 MG TABLET,DELAYED RELEASE
ORAL_TABLET | Freq: Every day | ORAL | 0 refills | 30.00000 days | Status: CP
Start: 2023-12-16 — End: 2024-01-15
  Filled 2023-12-16: qty 30, 30d supply, fill #0

## 2023-12-16 MED ORDER — ALBUTEROL SULFATE HFA 90 MCG/ACTUATION AEROSOL INHALER
RESPIRATORY_TRACT | 0 refills | 0.00000 days | Status: CP | PRN
Start: 2023-12-16 — End: 2024-12-15
  Filled 2023-12-16: qty 18, 17d supply, fill #0

## 2023-12-16 MED ORDER — AEROCHAMBER MV SPACER
0 refills | 0.00000 days | Status: CP
Start: 2023-12-16 — End: ?

## 2023-12-16 MED ORDER — DEXTROMETHORPHAN-GUAIFENESIN 10 MG-100 MG/5 ML ORAL SYRUP
ORAL | 0 refills | 4.00000 days | Status: CP | PRN
Start: 2023-12-16 — End: ?

## 2023-12-16 MED FILL — OPTICHAMBER DIAMOND VHC SPACER: 30 days supply | Qty: 1 | Fill #0

## 2023-12-16 NOTE — Consults (Addendum)
 "      Care Management Initial Transition Planning Assessment            General Care Manager / Social Worker assessed the patient by : In person interview with patient, Discussion with Clinical Care team, Medical record review Orientation Level: Oriented X4 Functional level prior to admission: Independent Reason for referral: Discharge Planning  Contact/Decision Maker Extended Emergency Contact Information Primary Emergency Contact: Windell Ee  [place] Phone: [phone] Relation: Daughter Secondary Emergency Contact: Balis,Ernest Address: [address]          [place], [place] [address] [place] Home Phone: [phone] Mobile Phone: [phone] Relation: Son  Legal Next of Kin / Guardian / POA / Advance Directives    HCDM (HCPOA): Rashidah, [place] - Son - [phone]   HCDM, First Alternate: Windell Ee - Daughter - [phone]  Advance Directive (Medical Treatment) Does patient have an advance directive covering medical treatment?: Patient has advance directive covering medical treatment, copy in chart.  Health Care Decision Maker [HCDM] (Medical & Mental Health Treatment) Healthcare Decision Maker: HCDM documented in the HCDM/Contact Info section. Information offered on HCDM, Medical & Mental Health advance directives:: Patient declined information.  Advance Directive (Mental Health Treatment) Does patient have an advance directive covering mental health treatment?: Patient does not have advance directive covering mental health treatment. Reason patient does not have an advance directive covering mental health treatment:: Patient does not wish to complete one at this time.  Readmission Information  Have you been hospitalized in the last 30 days?: No  Patient Information Lives with: Alone  Type of Residence: ILF (Independent living facility), Private residence Facility (Name/Phone #): [address]  [place] [address]  Home Phone:  [phone]      Support Systems/Concerns: Family Members, Friends/Neighbors  Responsibilities/Dependents at home?: No  Home Care services in place prior to admission?: No      Outpatient/Community Resources in place prior to admission: Clinic Agency detail (Name/Phone #): PCP [facility]  Equipment Currently Used at Home: oxygen  [facility]  Currently receiving outpatient dialysis?: No    Financial Information    Need for financial assistance?: No    Social Drivers of Health Social Drivers of Health were addressed in provider documentation.  Please refer to patient history. Social Drivers of Health   Food Insecurity: No Food Insecurity (12/16/2023)   Hunger Vital Sign    Worried About Running Out of Food in the Last Year: Never true    Ran Out of Food in the Last Year: Never true  Tobacco Use: Low Risk (12/12/2023)   [facility] from [facility]   Patient History    Smoking Tobacco Use: Never    Smokeless Tobacco Use: Never    Passive Exposure: Not on file  Transportation Needs: No Transportation Needs (12/16/2023)   PRAPARE - Transportation    Lack of Transportation (Medical): No    Lack of Transportation (Non-Medical): No  Alcohol Use: Not At Risk (06/20/2023)   Alcohol Use    How often do you have a drink containing alcohol?: Never    How many drinks containing alcohol do you have on a typical day when you are drinking?: Not on file    How often do you have 5 or more drinks on one occasion?: Never  Housing: Low Risk (12/16/2023)   Housing    Within the past 12 months, have you ever stayed: outside, in a car, in a tent, in an  overnight shelter, or temporarily in someone else's home (i.e. couch-surfing)?: No    Are you worried about losing your housing?: No  Physical Activity: Insufficiently Active (06/20/2023)   Exercise Vital Sign    Days of Exercise per Week: 3 days    Minutes of Exercise per Session: 40 min   Utilities: Low Risk (06/20/2023)   Utilities    Within the past 12 months, have you been unable to get utilities (heat, electricity) when it was really needed?: No  Stress: No Stress Concern Present (06/20/2023)   [employer] of Occupational Health - Occupational Stress Questionnaire    Feeling of Stress: Not at all  Interpersonal Safety: Not At Risk (12/15/2023)   Interpersonal Safety    Unsafe Where You Currently Live: No    Physically Hurt by Anyone: No    Abused by Anyone: No  Substance Use: Low Risk (06/20/2023)   Substance Use    In the past year, how often have you used prescription drugs for non-medical reasons?: Never    In the past year, how often have you used illegal drugs?: Never    In the past year, have you used any substance for non-medical reasons?: No  Intimate Partner Violence: Not At Risk (06/20/2023)   Humiliation, Afraid, Rape, and Kick questionnaire    Fear of Current or Ex-Partner: No    Emotionally Abused: No    Physically Abused: No    Sexually Abused: No  Social Connections: Moderately Integrated (06/20/2023)   Social Connection and Isolation Panel    Frequency of Communication with Friends and Family: More than three times a week    Frequency of Social Gatherings with Friends and Family: Three times a week    Attends Religious Services: More than 4 times per year    Active Member of Clubs or Organizations: Yes    Attends Banker Meetings: More than 4 times per year    Marital Status: Widowed  Physicist, Medical Strain: Low Risk (12/16/2023)   Overall Financial Resource Strain (CARDIA)    Difficulty of Paying Living Expenses: Not hard at all  Health Literacy: Low Risk (06/20/2023)   Health Literacy    : Never  Internet Connectivity: No Internet connectivity concern identified (06/20/2023)   Internet Connectivity    Do you have access to internet services: Yes    How do you connect to the internet: Personal Device at  home    Is your internet connection strong enough for you to watch video on your device without major problems?: Yes    Do you have enough data to get through the month?: Yes    Does at least one of the devices have a camera that you can use for video chat?: Yes    Complex Discharge Information  Is patient identified as a difficult/complex discharge?: No  Discharge Needs Assessment Concerns to be Addressed: adjustment to diagnosis/illness, decision-making  Clinical Risk Factors: > 65, New Diagnosis, Multiple Diagnoses (Chronic)  Barriers to taking medications: No  Prior overnight hospital stay or ED visit in last 90 days: No   Anticipated Changes Related to Illness: inability to care for self  Equipment Needed After Discharge: other (see comments) (TBD)  Discharge Facility/Level of Care Needs: other (see comments) (home with self care)  Readmission Risk of Unplanned Readmission Score:  % Predictive Model Details  No score data available for [facility] Risk of Unplanned Readmission   Readmitted Within the Last 30 Days? (No if blank)  Patient at risk  for readmission?: Yes  Discharge Plan Screen findings are: Care Manager reviewed the plan of the patient's care with the Multidisciplinary Team. No discharge planning needs identified at this time. Care Manager will continue to manage plan and monitor patient's progress with the team.  Expected Discharge Date: 12/16/2023  Expected Transfer from Critical Care:    Quality data for continuing care services shared with patient and/or representative?: N/A Patient and/or family were provided with choice of facilities / services that are available and appropriate to meet post hospital care needs?: N/A    Initial Assessment complete?: Yes    "

## 2023-12-16 NOTE — Discharge Summary (Signed)
 ------------------------------------------------------------------------------- Attestation signed by Joesph Prentice SQUIBB, MD at 12/17/23 1318 I saw and evaluated the patient, participating in the key portions of the service on the day of discharge. I reviewed the residents note and agree with the discharge plans and disposition. I personally spent >30 minutes in discharge planning services.   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 Improved with bronchodilators, steroids. She already has supplemental oxygen available to use with exertion while she recovers.  ------------ Prentice SQUIBB Joesph, MD PhD [facility] Medicine  -------------------------------------------------------------------------------   Physician Discharge Summary HBR 4 BT1 HBR [address] [place] [address] Dept: [phone] Loc: [phone]   Identifying Information:  Jacqueline Arroyo [birth year] [id number]  Primary Care Physician: Keven Crumbly Pap, DO   Code Status: DNR and DNI  Admit Date: 12/15/2023  Discharge Date: 12/16/2023   Discharge To: Home  Discharge Service: HBR - General Medicine Floor Team (MED M - [place])   Discharge Attending Physician: Prentice SQUIBB Joesph, MD  Discharge Diagnoses:  Principal Problem:   Acute bronchitis due to human metapneumovirus (POA: Yes) Active Problems:   Hypertension (POA: Yes)   Heart failure with mid-range ejection fraction (CMS-HCC) (POA: Yes)   Essential tremor (POA: Yes)   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 [facility] Course:  Jacqueline Arroyo is a 83 y.o. female who is presenting to [facility] with Acute bronchitis due to human metapneumovirus, in the setting of the following pertinent/contributing co-morbidities: HFmrEF (35-40%), HTN, CAD, T2DM, marginal zone lymphoma, polycythemia vera, essential tremor.  Outpatient to do [ ]  consider follow up CT chest in 12 months (monitor RUL GGO)    Asthma exacerbation  (+) Metapneumovirus  About 1 week and a half of URI symptoms with acute on chronic worsening of symptoms in the past 2 days.  Patient presented to the ED after she had noticeable wheezing, hypoxia to the 70s at home.  Denies fever, chills. Has known heart failure, but denies orthopnea, worsening dyspnea, lower extremity edema.  Pro BNP mildly elevated at 1k.  CT chest showed no consolidation consistent with pneumonia; has persistent nonspecific patchy GGO in RUL which was present on CT chest in January 2025. Exam on admission notable for bilateral expiratory wheezing.  Denies any smoking history.  Patient is on room air.  Presentation most consistent with mild asthma exacerbation in the setting of URI. Given albuterol  nebulizer treatments as well as prednisone  with modest improvement. Able to walk around without any desaturations. Required to wear 2L  at most for hypoxia. Instructed pt to wear 2L as needed at home, which was already what was prescribed for her prior.    HFmrEF (EF 35-40%) Follows with Dr. [place]. TTE 07/2023 EF 35-40%, stable compared to prior. EDW ~170 pounds. proBNP mildly elevated at 1k.  History not consistent with heart failure exacerbation.  Imaging and exam not consistent with pulmonary edema or volume overload. S/p IV Lasix  40 mg x 1 in ED. Held home ARB, PRN lasix  during admission. Continued metoprolol  succinate 50 mg daily, spironolactone 25 mg daily.     Chronic Problems  T2DM: Held home [place].  Last A1c 6.7.  Polycythemia vera: Continued  home Hydrea; 500 mg twice daily Monday through Thursday, 500 mg daily Friday through Sunday. Anxiety: Continued Ativan  0.5 mg twice daily CAD: Continued aspirin  81 mg daily HLD: Continued home Lipitor 10 mg daily, fenofibrate 145 mg daily        Outpatient Provider Follow Up Issues:  As above  Touchbase with Outpatient Provider: Warm Handoff: Completed on 12/16/23 by [name], MD  (Resident) via [facility] Message  Procedures: None ______________________________________________________________________ Discharge Medications:    Your Medication List     START taking these medications    AEROCHAMBER MV inhaler Generic drug: inhalational spacing device 1 each by Miscellaneous route Take as directed.   dextromethorphan-guaiFENesin  10-100 mg/5 mL liquid Commonly known as: ROBITUSSIN-DM Take 5 mL by mouth every four (4) hours as needed.   predniSONE  20 MG tablet Commonly known as: DELTASONE  Take 2 tablets (40 mg total) by mouth in the morning for 4 days. Start taking on: December 17, 2023       CHANGE how you take these medications    albuterol  90 mcg/actuation inhaler Commonly known as: PROVENTIL  HFA;VENTOLIN  HFA Inhale 2 puffs every four (4) hours as needed for wheezing. What changed: when to take this   hydroxyurea 500 mg capsule Commonly known as: HYDREA Take 1 capsule (500 mg total) by mouth two (2) times a day Monday- Thursday AND 1 capsule (500 mg total) daily Friday- Sunday. What changed: additional instructions       CONTINUE taking these medications    ACCU-CHEK GUIDE TEST STRIPS Strp Generic drug: blood sugar diagnostic by Other route Two (2) times a day (30 minutes before a meal).   aspirin  81 MG tablet Commonly known as: ECOTRIN Take 1 tablet (81 mg total) by mouth daily.   atorvastatin  10 MG tablet Commonly known as: LIPITOR Take 1 tablet (10 mg total) by mouth daily.   blood-glucose meter kit Use as instructed   cycloSPORINE  0.1 %  Drop Apply to eye.   estradiol 0.01 % (0.1 mg/gram) vaginal cream Commonly known as: ESTRACE Insert 2 g into the vagina daily. Use externally prn   fenofibrate 145 MG tablet Commonly known as: TRICOR Take 1 tablet by mouth once daily   fluticasone  propionate 50 mcg/actuation nasal spray Commonly known as: FLONASE  USE ONE (1) SPRAY IN EACH NOSTRIL ONCE DAILY   furosemide  20 MG tablet Commonly known as: LASIX  Take 1 tablet (20 mg total) by mouth daily as needed for swelling.   JANUMET XR 50-500 mg Tm24 Generic drug: sitagliptin phos-metFORMIN Take 2 tablets by mouth daily.   lancets Misc Commonly known as: ACCU-CHEK SOFTCLIX LANCETS USE ONE (1) LANCET TO CHECK GLUCOSE TWICE DAILY   lancing device Misc USE TO CHECK BLOOD SUGAR 2 TIMES A DAY   LORazepam  0.5 MG tablet Commonly known as: ATIVAN  TAKE 1/2 (ONE-HALF) TABLET BY MOUTH TWICE DAILY AS NEEDED FOR ANXIETY   losartan 50 MG tablet Commonly known as: COZAAR Take 1 tablet (50 mg total) by mouth two (2) times a day.   metoPROLOL  succinate 50 MG 24 hr tablet Commonly known as: Toprol -XL TAKE 1 TABLET BY MOUTH AT BEDTIME   spironolactone 25 MG tablet Commonly known as: ALDACTONE Take 1 tablet (25 mg total) by mouth daily.   traMADol  50 mg tablet Commonly known as: ULTRAM  Take 0.5 tablets (25 mg total) by mouth daily as needed for pain or severe pain.  Allergies: Amoxicillin , Doxycycline , Cephalexin, Ciprofloxacin, Hydrocodone-acetaminophen , and Sulfamethoxazole ______________________________________________________________________ Pending Test Results:   Most Recent Labs: All lab results last 24 hours -  Recent Results (from the past 24 hours)  Lactate Sepsis, Venous   Collection Time: 12/15/23  5:09 PM  Result Value Ref Range   Lactate, Venous 2.1 (HH) 0.5 - 1.8 mmol/L  hsTroponin I - 6 Hour   Collection Time: 12/15/23  5:09 PM  Result Value Ref Range   hsTroponin I 17 <=34 ng/L   delta  hsTroponin I 3 <=7 ng/L  POCT Glucose   Collection Time: 12/15/23  5:09 PM  Result Value Ref Range   Glucose, POC 147 70 - 179 mg/dL  Basic metabolic panel   Collection Time: 12/16/23  7:08 AM  Result Value Ref Range   Sodium 141 135 - 145 mmol/L   Potassium 4.2 3.4 - 4.8 mmol/L   Chloride 100 98 - 107 mmol/L   CO2 24.9 20.0 - 31.0 mmol/L   Anion Gap 16 (H) 5 - 14 mmol/L   BUN 24 (H) 9 - 23 mg/dL   Creatinine 8.87 (H) 9.44 - 1.02 mg/dL   BUN/Creatinine Ratio 21    eGFR CKD-EPI (2021) Female 48 (L) >=60 mL/min/1.83m2   Glucose 169 70 - 179 mg/dL   Calcium  9.6 8.7 - 10.4 mg/dL  CBC   Collection Time: 12/16/23  7:08 AM  Result Value Ref Range   WBC 2.6 (L) 3.6 - 11.2 10*9/L   RBC 3.67 (L) 3.95 - 5.13 10*12/L   HGB 13.4 11.3 - 14.9 g/dL   HCT 59.9 65.9 - 55.9 %   MCV 108.9 (H) 77.6 - 95.7 fL   MCH 36.6 (H) 25.9 - 32.4 pg   MCHC 33.6 32.0 - 36.0 g/dL   RDW 85.8 87.7 - 84.7 %   MPV 8.1 6.8 - 10.7 fL   Platelet 203 150 - 450 10*9/L  Magnesium Level   Collection Time: 12/16/23  7:08 AM  Result Value Ref Range   Magnesium 2.1 1.6 - 2.6 mg/dL  POCT Glucose   Collection Time: 12/16/23  8:02 AM  Result Value Ref Range   Glucose, POC 173 70 - 179 mg/dL  POCT Glucose   Collection Time: 12/16/23 12:09 PM  Result Value Ref Range   Glucose, POC 332 (H) 70 - 179 mg/dL    Relevant Studies/Radiology: CTA Chest W Contrast Result Date: 12/15/2023 EXAM: CTA CHEST W CONTRAST ACCESSION: [id number] UN REPORT DATE: 12/15/2023 2:29 PM CLINICAL INDICATION: sob w/ elevated d - dimer TECHNIQUE: Contiguous axial images were reconstructed through the chest following a single breath hold helical acquisition during the administration of intravenous contrast material. Images were reformatted in the coronal and sagittal planes. MIP slabs were also constructed. COMPARISON: CT chest 01/27/2023 FINDINGS: PULMONARY ARTERIES: No acute embolus in either lung. Again evident are multiple calcified filling  defects within the pulmonary arterial distribution associated with remote/chronic emboli. Main pulmonary artery is normal in size. HEART/VASCULATURE: Multichamber cardiac enlargement. No pericardial fluid. Calcified atherosclerotic disease is evident within the coronary arterial distribution.. Scattered calcified atherosclerotic disease throughout the thoracic aorta LUNGS/AIRWAYS/PLEURA: Diffuse mild bronchial wall thickening. Similar patchy foci of groundglass opacities within the apex of the right lung (images 14 and 41, series 6). No pleural effusion or pneumothorax. MEDIASTINUM/THORACIC INLET: No enlarged supraclavicular or intrathoracic lymph nodes. No thyroid abnormality. DIAPHRAGM/UPPER ABDOMEN: Diaphragm intact and normally positioned. Incomplete coverage/characterization of a exophytic cystic lesion associated with the left kidney CHEST WALL/BONES: No enlarged axillary lymph nodes.  Partially visualized vertebroplasty of the T12 vertebral body. Otherwise, mild degenerative changes of the thoracic spine.   1. No findings of acute pulmonary embolic disease. Again evident are multiple calcified filling defects within the pulmonary distribution of both lungs associated with remote/chronic emboli. 2. No consolidation to suggest infectious pneumonia or aspiration. 3. Persistent nonspecific patchy foci of groundglass opacities within the upper lobe of the right lung relative to the comparison exam from January 2025. Consider CT imaging surveillance in 12 months. 4. Mild bronchial thickening is typical of infectious/inflammatory airway disease 5. Calcified atherosclerotic disease is evident within the coronary arterial distribution. ==================== MODIFIED REPORT: (12/15/2023 3:04 PM) This report has been modified from its preliminary version; you may check the prior versions of radiology report, results history link for prior report versions (if they were previously visible in Epic).  -----------------------------------------------  ECG 12 Lead Result Date: 12/15/2023 SINUS RHYTHM WITH 1ST DEGREE AV BLOCK WITH OCCASIONAL PREMATURE VENTRICULAR BEATS LEFT AXIS DEVIATION LEFT BUNDLE BRANCH BLOCK ABNORMAL ECG WHEN COMPARED WITH ECG OF 15-Dec-2023 11:59, PREMATURE VENTRICULAR BEATS ARE NOW PRESENT Confirmed by Cleotilde Moccasin ([id number]) on 12/15/2023 2:41:12 PM  ECG 12 Lead Result Date: 12/15/2023 SINUS RHYTHM WITH 1ST DEGREE AV BLOCK LEFT AXIS DEVIATION LEFT BUNDLE BRANCH BLOCK ABNORMAL ECG WHEN COMPARED WITH ECG OF 11-Nov-2023 09:51, NO SIGNIFICANT CHANGE WAS FOUND Confirmed by Cleotilde Moccasin ([id number]) on 12/15/2023 2:38:05 PM  XR Chest Portable Result Date: 12/15/2023 EXAM: XR CHEST PORTABLE DATE: 12/15/2023 12:36 PM ACCESSION: [id number] UN DICTATED: 12/15/2023 12:41 PM CLINICAL INDICATION: 83 years old Female with SHORTNESS OF BREATH  TECHNIQUE: Portable chest radiograph COMPARISON: Multiple prior chest studies, the most recent XR CHEST 2 VIEWS 01/27/2023 FINDINGS: HARDWARE: No indwelling support hardware is evident. HEART &  MEDIASTINUM: The cardiomediastinal silhouette is unremarkable. PULMONARY/PLEURAL SPACE:  Low lung volumes with associated bibasilar atelectasis. This is accentuated on the right where there is persistent elevation of the hemidiaphragm. Otherwise, the lungs are well aerated without overt consolidation. There is no discernible accumulation gas or fluid within the pleural spaces.  OTHER:  Unremarkable imaging of the cephalad abdomen   No findings of acute cardiopulmonary disease per portable imaging   ______________________________________________________________________ Discharge Instructions:           Appointments which have been scheduled for you    Jan 01, 2024 8:00 AM (Arrive by 7:45 AM) PFT with PFT 3 [facility] PULMONARY SPECIALTY FUNCT [place] [address] [place] 1 through 4 [place]  [address] [phone]  If you have inhaled breathing medications, please do not use it 4 hours prior to this breathing test. Please wear comfortable clothing and well-fitting , closed toe shoes in the even that a 6-minute walk is part of your test. If you have fallen in the past month please inform your respiratory therapist at the start of your appointment.   While you may bring a guest to your appointment, they will not be able to be present in the testing area.    Jan 01, 2024 9:00 AM (Arrive by 8:45 AM) NEW PULMONARY with Heinz JAYSON Canny, MD [facility] PULMONARY SPECIALTY CL [place] [facility] REGION) [address] [facility] 1 through 4 [place] [address] [phone]     Jan 17, 2024 10:20 AM (Arrive by 10:05 AM) RETURN CONTINUITY with [name] Mangel, DO [facility] PRIMARY CARE [address] AT [facility] REGION) [address] [place] [address] [phone]  Jan 31, 2024 1:00 PM (Arrive by 12:30 PM) LAB ONLY [facility] with ADULT ONC LAB [facility] ADULT ONCOLOGY LAB [place] [facility] REGION) [address] [place] [address] [phone]     Jan 31, 2024 2:00 PM (Arrive by 1:30 PM) OFFICE VISIT with Duwaine Lamarr Nixon, PA [facility] REGION) [address] [place] [address] [phone]        ______________________________________________________________________ Discharge Day Services: BP 148/69   Pulse 119   Temp 35.3 C (95.5 F) (Oral)   Resp 20   Ht 157.5 cm (5' 2)   Wt 78.4 kg (172 lb 14.4 oz)   LMP  (LMP Unknown)   SpO2 95%   BMI 31.62 kg/m   Pt seen on the day of discharge and determined appropriate for discharge.  Condition at Discharge: good  Length of Discharge: I spent greater than 30 mins in the discharge of this patient.

## 2023-12-17 MED ORDER — PREDNISONE 20 MG TABLET
ORAL_TABLET | Freq: Every day | ORAL | 0 refills | 4.00000 days | Status: CP
Start: 2023-12-17 — End: 2023-12-21
  Filled 2023-12-16: qty 8, 4d supply, fill #0

## 2023-12-19 DIAGNOSIS — R0602 Shortness of breath: Principal | ICD-10-CM

## 2023-12-21 ENCOUNTER — Inpatient Hospital Stay
Admission: EM | Admit: 2023-12-21 | Discharge: 2024-01-15 | DRG: 871 | Disposition: A | Discharge status: Skilled Nursing Facility | Attending: Student | Admitting: Student

## 2023-12-21 ENCOUNTER — Emergency Department

## 2023-12-21 ENCOUNTER — Other Ambulatory Visit: Payer: Self-pay

## 2023-12-21 DIAGNOSIS — I5020 Unspecified systolic (congestive) heart failure: Secondary | ICD-10-CM | POA: Diagnosis present

## 2023-12-21 DIAGNOSIS — R6883 Chills (without fever): Principal | ICD-10-CM

## 2023-12-21 DIAGNOSIS — N179 Acute kidney failure, unspecified: Secondary | ICD-10-CM | POA: Diagnosis present

## 2023-12-21 DIAGNOSIS — E66811 Obesity, class 1: Secondary | ICD-10-CM | POA: Diagnosis present

## 2023-12-21 DIAGNOSIS — Z9981 Dependence on supplemental oxygen: Secondary | ICD-10-CM | POA: Diagnosis not present

## 2023-12-21 DIAGNOSIS — Z9049 Acquired absence of other specified parts of digestive tract: Secondary | ICD-10-CM

## 2023-12-21 DIAGNOSIS — G8929 Other chronic pain: Secondary | ICD-10-CM | POA: Diagnosis present

## 2023-12-21 DIAGNOSIS — M19072 Primary osteoarthritis, left ankle and foot: Secondary | ICD-10-CM | POA: Diagnosis present

## 2023-12-21 DIAGNOSIS — M25532 Pain in left wrist: Secondary | ICD-10-CM | POA: Diagnosis not present

## 2023-12-21 DIAGNOSIS — I5023 Acute on chronic systolic (congestive) heart failure: Secondary | ICD-10-CM | POA: Diagnosis not present

## 2023-12-21 DIAGNOSIS — N1831 Chronic kidney disease, stage 3a: Secondary | ICD-10-CM | POA: Diagnosis present

## 2023-12-21 DIAGNOSIS — E1122 Type 2 diabetes mellitus with diabetic chronic kidney disease: Secondary | ICD-10-CM | POA: Diagnosis present

## 2023-12-21 DIAGNOSIS — K5900 Constipation, unspecified: Secondary | ICD-10-CM | POA: Diagnosis not present

## 2023-12-21 DIAGNOSIS — E1111 Type 2 diabetes mellitus with ketoacidosis with coma: Secondary | ICD-10-CM | POA: Diagnosis present

## 2023-12-21 DIAGNOSIS — E11A Type 2 diabetes mellitus without complications in remission: Secondary | ICD-10-CM

## 2023-12-21 DIAGNOSIS — Z66 Do not resuscitate: Secondary | ICD-10-CM | POA: Diagnosis present

## 2023-12-21 DIAGNOSIS — E785 Hyperlipidemia, unspecified: Secondary | ICD-10-CM | POA: Diagnosis present

## 2023-12-21 DIAGNOSIS — Z794 Long term (current) use of insulin: Secondary | ICD-10-CM

## 2023-12-21 DIAGNOSIS — E1110 Type 2 diabetes mellitus with ketoacidosis without coma: Secondary | ICD-10-CM

## 2023-12-21 DIAGNOSIS — J45901 Unspecified asthma with (acute) exacerbation: Secondary | ICD-10-CM | POA: Diagnosis present

## 2023-12-21 DIAGNOSIS — Z79899 Other long term (current) drug therapy: Secondary | ICD-10-CM

## 2023-12-21 DIAGNOSIS — Z6833 Body mass index (BMI) 33.0-33.9, adult: Secondary | ICD-10-CM

## 2023-12-21 DIAGNOSIS — Z7901 Long term (current) use of anticoagulants: Secondary | ICD-10-CM | POA: Diagnosis not present

## 2023-12-21 DIAGNOSIS — Z888 Allergy status to other drugs, medicaments and biological substances status: Secondary | ICD-10-CM

## 2023-12-21 DIAGNOSIS — Z7982 Long term (current) use of aspirin: Secondary | ICD-10-CM

## 2023-12-21 DIAGNOSIS — D45 Polycythemia vera: Secondary | ICD-10-CM | POA: Diagnosis present

## 2023-12-21 DIAGNOSIS — Z882 Allergy status to sulfonamides status: Secondary | ICD-10-CM

## 2023-12-21 DIAGNOSIS — J189 Pneumonia, unspecified organism: Secondary | ICD-10-CM | POA: Diagnosis present

## 2023-12-21 DIAGNOSIS — J9611 Chronic respiratory failure with hypoxia: Secondary | ICD-10-CM | POA: Diagnosis present

## 2023-12-21 DIAGNOSIS — I82432 Acute embolism and thrombosis of left popliteal vein: Secondary | ICD-10-CM | POA: Diagnosis not present

## 2023-12-21 DIAGNOSIS — A419 Sepsis, unspecified organism: Secondary | ICD-10-CM | POA: Diagnosis present

## 2023-12-21 DIAGNOSIS — L89312 Pressure ulcer of right buttock, stage 2: Secondary | ICD-10-CM | POA: Diagnosis not present

## 2023-12-21 DIAGNOSIS — E871 Hypo-osmolality and hyponatremia: Secondary | ICD-10-CM | POA: Diagnosis present

## 2023-12-21 DIAGNOSIS — M19012 Primary osteoarthritis, left shoulder: Secondary | ICD-10-CM | POA: Diagnosis present

## 2023-12-21 DIAGNOSIS — Z7951 Long term (current) use of inhaled steroids: Secondary | ICD-10-CM

## 2023-12-21 DIAGNOSIS — R339 Retention of urine, unspecified: Secondary | ICD-10-CM | POA: Diagnosis not present

## 2023-12-21 DIAGNOSIS — Z1152 Encounter for screening for COVID-19: Secondary | ICD-10-CM | POA: Diagnosis not present

## 2023-12-21 DIAGNOSIS — T380X5A Adverse effect of glucocorticoids and synthetic analogues, initial encounter: Secondary | ICD-10-CM | POA: Diagnosis not present

## 2023-12-21 DIAGNOSIS — J45909 Unspecified asthma, uncomplicated: Secondary | ICD-10-CM | POA: Diagnosis present

## 2023-12-21 DIAGNOSIS — Z881 Allergy status to other antibiotic agents status: Secondary | ICD-10-CM

## 2023-12-21 DIAGNOSIS — I130 Hypertensive heart and chronic kidney disease with heart failure and stage 1 through stage 4 chronic kidney disease, or unspecified chronic kidney disease: Secondary | ICD-10-CM | POA: Diagnosis present

## 2023-12-21 DIAGNOSIS — Z880 Allergy status to penicillin: Secondary | ICD-10-CM

## 2023-12-21 DIAGNOSIS — I10 Essential (primary) hypertension: Secondary | ICD-10-CM | POA: Diagnosis present

## 2023-12-21 DIAGNOSIS — Z7984 Long term (current) use of oral hypoglycemic drugs: Secondary | ICD-10-CM

## 2023-12-21 DIAGNOSIS — E119 Type 2 diabetes mellitus without complications: Secondary | ICD-10-CM

## 2023-12-21 DIAGNOSIS — Z791 Long term (current) use of non-steroidal anti-inflammatories (NSAID): Secondary | ICD-10-CM

## 2023-12-21 LAB — CBC WITH DIFFERENTIAL/PLATELET
Abs Immature Granulocytes: 0.49 K/uL — ABNORMAL HIGH (ref 0.00–0.07)
Abs Immature Granulocytes: 0.34 K/uL — ABNORMAL HIGH (ref 0.00–0.07)
Basophils Absolute: 0 K/uL (ref 0.0–0.1)
Basophils Absolute: 0 K/uL (ref 0.0–0.1)
Basophils Relative: 0 %
Basophils Relative: 0 %
Eosinophils Absolute: 0 K/uL (ref 0.0–0.5)
Eosinophils Absolute: 0 K/uL (ref 0.0–0.5)
Eosinophils Relative: 0 %
Eosinophils Relative: 0 %
HCT: 40.5 % (ref 36.0–46.0)
HCT: 38.3 % (ref 36.0–46.0)
Hemoglobin: 13.5 g/dL (ref 12.0–15.0)
Hemoglobin: 12.2 g/dL (ref 12.0–15.0)
Immature Granulocytes: 5 %
Immature Granulocytes: 3 %
Lymphocytes Relative: 7 %
Lymphocytes Relative: 7 %
Lymphs Abs: 0.8 K/uL (ref 0.7–4.0)
Lymphs Abs: 0.8 K/uL (ref 0.7–4.0)
MCH: 37.2 pg — ABNORMAL HIGH (ref 26.0–34.0)
MCH: 36 pg — ABNORMAL HIGH (ref 26.0–34.0)
MCHC: 33.3 g/dL (ref 30.0–36.0)
MCHC: 31.9 g/dL (ref 30.0–36.0)
MCV: 111.6 fL — ABNORMAL HIGH (ref 80.0–100.0)
MCV: 113 fL — ABNORMAL HIGH (ref 80.0–100.0)
Monocytes Absolute: 1 K/uL (ref 0.1–1.0)
Monocytes Absolute: 1.3 K/uL — ABNORMAL HIGH (ref 0.1–1.0)
Monocytes Relative: 9 %
Monocytes Relative: 12 %
Neutro Abs: 8.7 K/uL — ABNORMAL HIGH (ref 1.7–7.7)
Neutro Abs: 8.2 K/uL — ABNORMAL HIGH (ref 1.7–7.7)
Neutrophils Relative %: 79 %
Neutrophils Relative %: 78 %
Platelets: 225 K/uL (ref 150–400)
Platelets: 182 K/uL (ref 150–400)
RBC: 3.63 MIL/uL — ABNORMAL LOW (ref 3.87–5.11)
RBC: 3.39 MIL/uL — ABNORMAL LOW (ref 3.87–5.11)
RDW: 12.9 % (ref 11.5–15.5)
RDW: 12.9 % (ref 11.5–15.5)
Smear Review: NORMAL
WBC: 10.9 K/uL — ABNORMAL HIGH (ref 4.0–10.5)
WBC: 10.6 K/uL — ABNORMAL HIGH (ref 4.0–10.5)
nRBC: 0 % (ref 0.0–0.2)
nRBC: 0 % (ref 0.0–0.2)

## 2023-12-21 LAB — COMPREHENSIVE METABOLIC PANEL WITH GFR
ALT: 20 U/L (ref 0–44)
AST: 13 U/L — ABNORMAL LOW (ref 15–41)
Albumin: 3.9 g/dL (ref 3.5–5.0)
Alkaline Phosphatase: 35 U/L — ABNORMAL LOW (ref 38–126)
Anion gap: 17 — ABNORMAL HIGH (ref 5–15)
BUN: 32 mg/dL — ABNORMAL HIGH (ref 8–23)
CO2: 17 mmol/L — ABNORMAL LOW (ref 22–32)
Calcium: 9.4 mg/dL (ref 8.9–10.3)
Chloride: 97 mmol/L — ABNORMAL LOW (ref 98–111)
Creatinine, Ser: 1.22 mg/dL — ABNORMAL HIGH (ref 0.44–1.00)
GFR, Estimated: 43 mL/min — ABNORMAL LOW
Glucose, Bld: 277 mg/dL — ABNORMAL HIGH (ref 70–99)
Potassium: 4.6 mmol/L (ref 3.5–5.1)
Sodium: 131 mmol/L — ABNORMAL LOW (ref 135–145)
Total Bilirubin: 0.8 mg/dL (ref 0.0–1.2)
Total Protein: 7.1 g/dL (ref 6.5–8.1)

## 2023-12-21 LAB — BASIC METABOLIC PANEL WITH GFR
Anion gap: 13 (ref 5–15)
Anion gap: 14 (ref 5–15)
Anion gap: 12 (ref 5–15)
Anion gap: 13 (ref 5–15)
BUN: 40 mg/dL — ABNORMAL HIGH (ref 8–23)
BUN: 34 mg/dL — ABNORMAL HIGH (ref 8–23)
BUN: 31 mg/dL — ABNORMAL HIGH (ref 8–23)
BUN: 32 mg/dL — ABNORMAL HIGH (ref 8–23)
CO2: 22 mmol/L (ref 22–32)
CO2: 21 mmol/L — ABNORMAL LOW (ref 22–32)
CO2: 22 mmol/L (ref 22–32)
CO2: 21 mmol/L — ABNORMAL LOW (ref 22–32)
Calcium: 9.5 mg/dL (ref 8.9–10.3)
Calcium: 8.7 mg/dL — ABNORMAL LOW (ref 8.9–10.3)
Calcium: 9.1 mg/dL (ref 8.9–10.3)
Calcium: 8.7 mg/dL — ABNORMAL LOW (ref 8.9–10.3)
Chloride: 99 mmol/L (ref 98–111)
Chloride: 101 mmol/L (ref 98–111)
Chloride: 101 mmol/L (ref 98–111)
Chloride: 101 mmol/L (ref 98–111)
Creatinine, Ser: 1.6 mg/dL — ABNORMAL HIGH (ref 0.44–1.00)
Creatinine, Ser: 1.33 mg/dL — ABNORMAL HIGH (ref 0.44–1.00)
Creatinine, Ser: 1.32 mg/dL — ABNORMAL HIGH (ref 0.44–1.00)
Creatinine, Ser: 1.39 mg/dL — ABNORMAL HIGH (ref 0.44–1.00)
GFR, Estimated: 31 mL/min — ABNORMAL LOW
GFR, Estimated: 39 mL/min — ABNORMAL LOW
GFR, Estimated: 39 mL/min — ABNORMAL LOW
GFR, Estimated: 37 mL/min — ABNORMAL LOW
Glucose, Bld: 241 mg/dL — ABNORMAL HIGH (ref 70–99)
Glucose, Bld: 181 mg/dL — ABNORMAL HIGH (ref 70–99)
Glucose, Bld: 183 mg/dL — ABNORMAL HIGH (ref 70–99)
Glucose, Bld: 259 mg/dL — ABNORMAL HIGH (ref 70–99)
Potassium: 4.3 mmol/L (ref 3.5–5.1)
Potassium: 4.8 mmol/L (ref 3.5–5.1)
Potassium: 4.5 mmol/L (ref 3.5–5.1)
Potassium: 4.9 mmol/L (ref 3.5–5.1)
Sodium: 133 mmol/L — ABNORMAL LOW (ref 135–145)
Sodium: 136 mmol/L (ref 135–145)
Sodium: 135 mmol/L (ref 135–145)
Sodium: 134 mmol/L — ABNORMAL LOW (ref 135–145)

## 2023-12-21 LAB — BETA-HYDROXYBUTYRIC ACID
Beta-Hydroxybutyric Acid: 1.05 mmol/L — ABNORMAL HIGH (ref 0.05–0.27)
Beta-Hydroxybutyric Acid: 0.51 mmol/L — ABNORMAL HIGH (ref 0.05–0.27)
Beta-Hydroxybutyric Acid: 0.16 mmol/L (ref 0.05–0.27)
Beta-Hydroxybutyric Acid: 0.15 mmol/L (ref 0.05–0.27)

## 2023-12-21 LAB — CBC
HCT: 27 % — ABNORMAL LOW (ref 36.0–46.0)
HCT: 36.1 % (ref 36.0–46.0)
Hemoglobin: 8.4 g/dL — ABNORMAL LOW (ref 12.0–15.0)
Hemoglobin: 11.7 g/dL — ABNORMAL LOW (ref 12.0–15.0)
MCH: 35.7 pg — ABNORMAL HIGH (ref 26.0–34.0)
MCH: 36.6 pg — ABNORMAL HIGH (ref 26.0–34.0)
MCHC: 31.1 g/dL (ref 30.0–36.0)
MCHC: 32.4 g/dL (ref 30.0–36.0)
MCV: 114.9 fL — ABNORMAL HIGH (ref 80.0–100.0)
MCV: 112.8 fL — ABNORMAL HIGH (ref 80.0–100.0)
Platelets: 117 K/uL — ABNORMAL LOW (ref 150–400)
Platelets: 163 K/uL (ref 150–400)
RBC: 2.35 MIL/uL — ABNORMAL LOW (ref 3.87–5.11)
RBC: 3.2 MIL/uL — ABNORMAL LOW (ref 3.87–5.11)
RDW: 12.8 % (ref 11.5–15.5)
RDW: 12.7 % (ref 11.5–15.5)
WBC: 7.1 K/uL (ref 4.0–10.5)
WBC: 9.1 K/uL (ref 4.0–10.5)
nRBC: 0 % (ref 0.0–0.2)
nRBC: 0 % (ref 0.0–0.2)

## 2023-12-21 LAB — URINALYSIS, W/ REFLEX TO CULTURE (INFECTION SUSPECTED)
Bacteria, UA: NONE SEEN
Bilirubin Urine: NEGATIVE
Glucose, UA: NEGATIVE mg/dL
Hgb urine dipstick: NEGATIVE
Ketones, ur: NEGATIVE mg/dL
Leukocytes,Ua: NEGATIVE
Nitrite: NEGATIVE
Protein, ur: NEGATIVE mg/dL
Specific Gravity, Urine: 1.009 (ref 1.005–1.030)
pH: 6 (ref 5.0–8.0)

## 2023-12-21 LAB — BLOOD GAS, VENOUS
Acid-base deficit: 0.5 mmol/L (ref 0.0–2.0)
Bicarbonate: 24.2 mmol/L (ref 20.0–28.0)
O2 Saturation: 63.2 %
Patient temperature: 37
pCO2, Ven: 39 mmHg — ABNORMAL LOW (ref 44–60)
pH, Ven: 7.4 (ref 7.25–7.43)
pO2, Ven: 37 mmHg (ref 32–45)

## 2023-12-21 LAB — LACTIC ACID, PLASMA
Lactic Acid, Venous: 1.1 mmol/L (ref 0.5–1.9)
Lactic Acid, Venous: 1.2 mmol/L (ref 0.5–1.9)
Lactic Acid, Venous: 0.9 mmol/L (ref 0.5–1.9)

## 2023-12-21 LAB — CBG MONITORING, ED
Glucose-Capillary: 197 mg/dL — ABNORMAL HIGH (ref 70–99)
Glucose-Capillary: 184 mg/dL — ABNORMAL HIGH (ref 70–99)
Glucose-Capillary: 158 mg/dL — ABNORMAL HIGH (ref 70–99)
Glucose-Capillary: 215 mg/dL — ABNORMAL HIGH (ref 70–99)
Glucose-Capillary: 217 mg/dL — ABNORMAL HIGH (ref 70–99)

## 2023-12-21 LAB — TYPE AND SCREEN
ABO/RH(D): A POS
Antibody Screen: NEGATIVE

## 2023-12-21 LAB — RESP PANEL BY RT-PCR (RSV, FLU A&B, COVID)  RVPGX2
Influenza A by PCR: NEGATIVE
Influenza B by PCR: NEGATIVE
Resp Syncytial Virus by PCR: NEGATIVE
SARS Coronavirus 2 by RT PCR: NEGATIVE

## 2023-12-21 LAB — HEMOGLOBIN A1C
Hgb A1c MFr Bld: 7.8 % — ABNORMAL HIGH (ref 4.8–5.6)
Mean Plasma Glucose: 177.16 mg/dL

## 2023-12-21 LAB — PROCALCITONIN: Procalcitonin: 1.33 ng/mL

## 2023-12-21 LAB — STREP PNEUMONIAE URINARY ANTIGEN: Strep Pneumo Urinary Antigen: NEGATIVE

## 2023-12-21 MED ORDER — HEPARIN SODIUM (PORCINE) 5000 UNIT/ML IJ SOLN
5000.0000 [IU] | Freq: Three times a day (TID) | INTRAMUSCULAR | Status: DC
  Filled 2023-12-21: qty 1

## 2023-12-21 MED ORDER — IPRATROPIUM-ALBUTEROL 0.5-2.5 (3) MG/3ML IN SOLN
3.0000 mL | Freq: Four times a day (QID) | RESPIRATORY_TRACT | Status: DC | PRN
  Administered 2023-12-21 (×2): 3 mL via RESPIRATORY_TRACT
  Filled 2023-12-21 (×2): qty 3

## 2023-12-21 MED ORDER — LACTATED RINGERS IV SOLN: INTRAVENOUS | Status: DC

## 2023-12-21 MED ORDER — LACTATED RINGERS IV BOLUS (SEPSIS)
1500.0000 mL | Freq: Once | INTRAVENOUS | Status: AC
  Administered 2023-12-21: 1500 mL via INTRAVENOUS

## 2023-12-21 MED ORDER — INSULIN ASPART 100 UNIT/ML IJ SOLN
3.0000 [IU] | Freq: Three times a day (TID) | INTRAMUSCULAR | Status: DC
  Administered 2023-12-21 – 2023-12-23 (×6): 3 [IU] via SUBCUTANEOUS
  Filled 2023-12-21 (×6): qty 3

## 2023-12-21 MED ORDER — POTASSIUM CHLORIDE 10 MEQ/100ML IV SOLN
10.0000 meq | INTRAVENOUS | Status: AC
  Administered 2023-12-21 (×2): 10 meq via INTRAVENOUS
  Filled 2023-12-21 (×2): qty 100

## 2023-12-21 MED ORDER — LACTATED RINGERS IV SOLN: 150.0000 mL/h | INTRAVENOUS | Status: DC

## 2023-12-21 MED ORDER — SODIUM CHLORIDE 0.9 % IV SOLN: 2.0000 g | INTRAVENOUS | Status: DC

## 2023-12-21 MED ORDER — AMLODIPINE BESYLATE 5 MG PO TABS: 10.0000 mg | ORAL_TABLET | Freq: Every day | ORAL | Status: DC

## 2023-12-21 MED ORDER — ATORVASTATIN CALCIUM 20 MG PO TABS
10.0000 mg | ORAL_TABLET | Freq: Every day | ORAL | Status: DC
  Administered 2023-12-22 – 2024-01-15 (×25): 10 mg via ORAL
  Filled 2023-12-21 (×18): qty 1

## 2023-12-21 MED ORDER — ACETAMINOPHEN 650 MG RE SUPP: 650.0000 mg | Freq: Four times a day (QID) | RECTAL | Status: DC | PRN

## 2023-12-21 MED ORDER — SODIUM CHLORIDE 0.9 % IV SOLN
2.0000 g | INTRAVENOUS | Status: AC
  Administered 2023-12-21 – 2023-12-25 (×5): 2 g via INTRAVENOUS
  Filled 2023-12-21 (×5): qty 20

## 2023-12-21 MED ORDER — OXYCODONE HCL 5 MG PO TABS
5.0000 mg | ORAL_TABLET | ORAL | Status: DC | PRN
  Administered 2023-12-21 – 2023-12-25 (×9): 5 mg via ORAL
  Filled 2023-12-21 (×9): qty 1

## 2023-12-21 MED ORDER — SODIUM CHLORIDE 0.9 % IV SOLN
500.0000 mg | INTRAVENOUS | Status: DC
  Administered 2023-12-21 – 2023-12-23 (×3): 500 mg via INTRAVENOUS
  Filled 2023-12-21 (×3): qty 5

## 2023-12-21 MED ORDER — DEXTROSE IN LACTATED RINGERS 5 % IV SOLN: INTRAVENOUS | Status: DC

## 2023-12-21 MED ORDER — INSULIN ASPART 100 UNIT/ML IJ SOLN
0.0000 [IU] | Freq: Every day | INTRAMUSCULAR | Status: DC
  Administered 2023-12-21: 2 [IU] via SUBCUTANEOUS
  Administered 2023-12-22: 5 [IU] via SUBCUTANEOUS
  Administered 2023-12-23 – 2023-12-25 (×3): 3 [IU] via SUBCUTANEOUS
  Administered 2023-12-26: 2 [IU] via SUBCUTANEOUS
  Administered 2023-12-27: 3 [IU] via SUBCUTANEOUS
  Administered 2023-12-31 – 2024-01-05 (×5): 2 [IU] via SUBCUTANEOUS
  Filled 2023-12-21 (×2): qty 3
  Filled 2023-12-21: qty 2
  Filled 2023-12-21 (×2): qty 3
  Filled 2023-12-21: qty 1
  Filled 2023-12-21 (×3): qty 2

## 2023-12-21 MED ORDER — PANTOPRAZOLE SODIUM 40 MG IV SOLR
40.0000 mg | Freq: Two times a day (BID) | INTRAVENOUS | Status: DC
  Administered 2023-12-21 – 2023-12-22 (×4): 40 mg via INTRAVENOUS
  Filled 2023-12-21 (×4): qty 10

## 2023-12-21 MED ORDER — INSULIN ASPART 100 UNIT/ML IJ SOLN
0.0000 [IU] | Freq: Three times a day (TID) | INTRAMUSCULAR | Status: DC
  Administered 2023-12-21: 3 [IU] via SUBCUTANEOUS
  Administered 2023-12-21 – 2023-12-23 (×4): 2 [IU] via SUBCUTANEOUS
  Administered 2023-12-23: 1 [IU] via SUBCUTANEOUS
  Administered 2023-12-23: 7 [IU] via SUBCUTANEOUS
  Administered 2023-12-24: 1 [IU] via SUBCUTANEOUS
  Administered 2023-12-24: 5 [IU] via SUBCUTANEOUS
  Administered 2023-12-24: 7 [IU] via SUBCUTANEOUS
  Administered 2023-12-25: 9 [IU] via SUBCUTANEOUS
  Administered 2023-12-25: 2 [IU] via SUBCUTANEOUS
  Administered 2023-12-25: 7 [IU] via SUBCUTANEOUS
  Administered 2023-12-26: 3 [IU] via SUBCUTANEOUS
  Administered 2023-12-26: 5 [IU] via SUBCUTANEOUS
  Administered 2023-12-26: 3 [IU] via SUBCUTANEOUS
  Administered 2023-12-27 (×3): 5 [IU] via SUBCUTANEOUS
  Administered 2023-12-28 (×3): 3 [IU] via SUBCUTANEOUS
  Administered 2023-12-29: 5 [IU] via SUBCUTANEOUS
  Administered 2023-12-29 – 2023-12-30 (×4): 2 [IU] via SUBCUTANEOUS
  Administered 2023-12-30 – 2023-12-31 (×2): 3 [IU] via SUBCUTANEOUS
  Administered 2023-12-31: 1 [IU] via SUBCUTANEOUS
  Administered 2023-12-31 – 2024-01-01 (×2): 3 [IU] via SUBCUTANEOUS
  Administered 2024-01-01: 5 [IU] via SUBCUTANEOUS
  Administered 2024-01-01 – 2024-01-02 (×2): 2 [IU] via SUBCUTANEOUS
  Administered 2024-01-02: 5 [IU] via SUBCUTANEOUS
  Administered 2024-01-02 – 2024-01-03 (×3): 3 [IU] via SUBCUTANEOUS
  Administered 2024-01-03: 2 [IU] via SUBCUTANEOUS
  Administered 2024-01-04 – 2024-01-05 (×4): 3 [IU] via SUBCUTANEOUS
  Administered 2024-01-05: 9 [IU] via SUBCUTANEOUS
  Administered 2024-01-05 – 2024-01-06 (×3): 2 [IU] via SUBCUTANEOUS
  Administered 2024-01-06: 7 [IU] via SUBCUTANEOUS
  Administered 2024-01-07: 2 [IU] via SUBCUTANEOUS
  Administered 2024-01-07: 3 [IU] via SUBCUTANEOUS
  Administered 2024-01-07: 2 [IU] via SUBCUTANEOUS
  Administered 2024-01-08 (×3): 1 [IU] via SUBCUTANEOUS
  Administered 2024-01-09: 2 [IU] via SUBCUTANEOUS
  Administered 2024-01-09: 3 [IU] via SUBCUTANEOUS
  Administered 2024-01-09 – 2024-01-10 (×3): 2 [IU] via SUBCUTANEOUS
  Administered 2024-01-10: 1 [IU] via SUBCUTANEOUS
  Administered 2024-01-11: 2 [IU] via SUBCUTANEOUS
  Administered 2024-01-11: 1 [IU] via SUBCUTANEOUS
  Administered 2024-01-11: 3 [IU] via SUBCUTANEOUS
  Administered 2024-01-12 (×2): 1 [IU] via SUBCUTANEOUS
  Administered 2024-01-12: 2 [IU] via SUBCUTANEOUS
  Administered 2024-01-13: 1 [IU] via SUBCUTANEOUS
  Administered 2024-01-13 – 2024-01-14 (×4): 2 [IU] via SUBCUTANEOUS
  Administered 2024-01-14: 1 [IU] via SUBCUTANEOUS
  Administered 2024-01-15 (×2): 2 [IU] via SUBCUTANEOUS
  Filled 2023-12-21: qty 1
  Filled 2023-12-21: qty 2
  Filled 2023-12-21: qty 7
  Filled 2023-12-21: qty 3
  Filled 2023-12-21: qty 7
  Filled 2023-12-21 (×2): qty 2
  Filled 2023-12-21 (×2): qty 1
  Filled 2023-12-21: qty 9
  Filled 2023-12-21 (×3): qty 2
  Filled 2023-12-21: qty 5
  Filled 2023-12-21: qty 2
  Filled 2023-12-21: qty 1
  Filled 2023-12-21: qty 5
  Filled 2023-12-21 (×2): qty 1
  Filled 2023-12-21: qty 3
  Filled 2023-12-21: qty 2
  Filled 2023-12-21: qty 1
  Filled 2023-12-21: qty 7
  Filled 2023-12-21: qty 2
  Filled 2023-12-21: qty 5
  Filled 2023-12-21 (×2): qty 2
  Filled 2023-12-21: qty 3
  Filled 2023-12-21 (×2): qty 2
  Filled 2023-12-21: qty 5
  Filled 2023-12-21: qty 2
  Filled 2023-12-21: qty 3
  Filled 2023-12-21: qty 1
  Filled 2023-12-21 (×2): qty 2
  Filled 2023-12-21: qty 5
  Filled 2023-12-21: qty 2
  Filled 2023-12-21 (×2): qty 3
  Filled 2023-12-21 (×3): qty 2
  Filled 2023-12-21: qty 1
  Filled 2023-12-21: qty 3
  Filled 2023-12-21: qty 1
  Filled 2023-12-21: qty 3
  Filled 2023-12-21: qty 2
  Filled 2023-12-21: qty 1
  Filled 2023-12-21: qty 2
  Filled 2023-12-21: qty 5
  Filled 2023-12-21: qty 7
  Filled 2023-12-21: qty 2
  Filled 2023-12-21: qty 3

## 2023-12-21 MED ORDER — INSULIN REGULAR(HUMAN) IN NACL 100-0.9 UT/100ML-% IV SOLN
INTRAVENOUS | Status: DC
  Filled 2023-12-21: qty 100

## 2023-12-21 MED ORDER — ONDANSETRON HCL 4 MG/2ML IJ SOLN: 4.0000 mg | Freq: Four times a day (QID) | INTRAMUSCULAR | Status: DC | PRN

## 2023-12-21 MED ORDER — ACETAMINOPHEN 325 MG PO TABS
650.0000 mg | ORAL_TABLET | Freq: Four times a day (QID) | ORAL | Status: DC | PRN
  Administered 2023-12-21 – 2024-01-10 (×14): 650 mg via ORAL
  Filled 2023-12-21 (×10): qty 2

## 2023-12-21 MED ORDER — DEXTROSE 50 % IV SOLN: 0.0000 mL | INTRAVENOUS | Status: DC | PRN

## 2023-12-21 MED ORDER — ONDANSETRON HCL 4 MG PO TABS: 4.0000 mg | ORAL_TABLET | Freq: Four times a day (QID) | ORAL | Status: DC | PRN

## 2023-12-21 MED ORDER — GUAIFENESIN 100 MG/5ML PO LIQD: 5.0000 mL | ORAL | Status: DC | PRN

## 2023-12-21 MED ORDER — SODIUM CHLORIDE 0.9 % IV SOLN: 500.0000 mg | INTRAVENOUS | Status: DC

## 2023-12-21 MED ORDER — ASPIRIN 81 MG PO TBEC
81.0000 mg | DELAYED_RELEASE_TABLET | Freq: Every day | ORAL | Status: DC
  Administered 2023-12-22 – 2024-01-15 (×25): 81 mg via ORAL
  Filled 2023-12-21 (×18): qty 1

## 2023-12-21 MED ORDER — METOPROLOL SUCCINATE ER 50 MG PO TB24: 50.0000 mg | ORAL_TABLET | Freq: Two times a day (BID) | ORAL | Status: DC

## 2023-12-21 MED ORDER — METOPROLOL SUCCINATE ER 50 MG PO TB24
50.0000 mg | ORAL_TABLET | Freq: Every day | ORAL | Status: DC
  Administered 2023-12-21 – 2023-12-25 (×5): 50 mg via ORAL
  Filled 2023-12-21 (×5): qty 1

## 2023-12-21 NOTE — ED Notes (Signed)
 Light green drawn to recheck Calcium 

## 2023-12-21 NOTE — ED Provider Notes (Addendum)
 "  [facility] Provider Note    Event Date/Time   First MD Initiated Contact with Patient 12/21/23 [phone]     (approximate)   History   Weakness   HPI  Jacqueline Arroyo is a 83 y.o. 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  Hospitalization from [facility] discharged 12/16/2023 diagnosed with bronchitis and exacerbation of asthma with worsening respiratory illness symptoms especially with shaking chills and weakness and nausea.  No abdominal pain.  No GU symptoms. Continues to have a cough.   External Medical Documents Reviewed: [facility] hospitalization notes from last week      Physical Exam   Triage Vital Signs: ED Triage Vitals  Encounter Vitals Group     BP 12/21/23 0025 132/70     Girls Systolic BP Percentile --      Girls Diastolic BP Percentile --      Boys Systolic BP Percentile --      Boys Diastolic BP Percentile --      Pulse Rate 12/21/23 0025 (!) 121     Resp 12/21/23 0025 20     Temp 12/21/23 0025 99.5 F (37.5 C)     Temp Source 12/21/23 0025 Oral     SpO2 12/21/23 0025 93 %     Weight 12/21/23 0026 199 lb 13.6 oz (90.6 kg)     Height --      Head Circumference --      Peak Flow --      Pain Score 12/21/23 0025 0     Pain Loc --      Pain Education --      Exclude from Growth Chart --     Most recent vital signs: Vitals:   12/21/23 0025 12/21/23 0455  BP: 132/70 (!) 108/49  Pulse: (!) 121 (!) 106  Resp: 20 20  Temp: 99.5 F (37.5 C) 97.6 F (36.4 C)  SpO2: 93% 90%    General: Awake, no distress.  CV:  Good peripheral perfusion.  Resp:  Normal effort.  Abd:  No distention.  Other:  Frail-appearing.  Tachycardic.  Mildly hypotensive with a significant systolic and diastolic blood pressure drop from her discharge vital signs from [facility]  just 5 days ago.  No focality on lung exam no significant wheezing.  No increased work of breathing.  Soft benign abdominal exam.  Neck supple full range of motion.   ED Results / Procedures / Treatments   Labs (all labs ordered are listed, but only abnormal results are displayed) Labs Reviewed  COMPREHENSIVE METABOLIC PANEL WITH GFR - Abnormal; Notable for the following components:      Result Value   Sodium 131 (*)    Chloride 97 (*)    CO2 17 (*)    Glucose, Bld 277 (*)    BUN 32 (*)    Creatinine, Ser 1.22 (*)    AST 13 (*)    Alkaline Phosphatase 35 (*)    GFR, Estimated 43 (*)    Anion gap 17 (*)    All other components within normal limits  CBC WITH DIFFERENTIAL/PLATELET - Abnormal; Notable for the following components:   WBC 10.9 (*)    RBC 3.63 (*)    MCV 111.6 (*)    MCH 37.2 (*)    Neutro Abs 8.7 (*)  Abs Immature Granulocytes 0.49 (*)    All other components within normal limits  BETA-HYDROXYBUTYRIC ACID - Abnormal; Notable for the following components:   Beta-Hydroxybutyric Acid 1.05 (*)    All other components within normal limits  BASIC METABOLIC PANEL WITH GFR - Abnormal; Notable for the following components:   Sodium 133 (*)    Glucose, Bld 241 (*)    BUN 40 (*)    Creatinine, Ser 1.60 (*)    GFR, Estimated 31 (*)    All other components within normal limits  BETA-HYDROXYBUTYRIC ACID - Abnormal; Notable for the following components:   Beta-Hydroxybutyric Acid 0.51 (*)    All other components within normal limits  CBC WITH DIFFERENTIAL/PLATELET - Abnormal; Notable for the following components:   WBC 10.6 (*)    RBC 3.39 (*)    MCV 113.0 (*)    MCH 36.0 (*)    Neutro Abs 8.2 (*)    Monocytes Absolute 1.3 (*)    Abs Immature Granulocytes 0.34 (*)    All other components within normal limits  BLOOD GAS, VENOUS - Abnormal; Notable for the following components:   pCO2, Ven 39 (*)    All other components within normal limits  RESP PANEL BY RT-PCR  (RSV, FLU A&B, COVID)  RVPGX2  CULTURE, BLOOD (ROUTINE X 2)  CULTURE, BLOOD (ROUTINE X 2)  LACTIC ACID, PLASMA  LACTIC ACID, PLASMA  LACTIC ACID, PLASMA  URINALYSIS, W/ REFLEX TO CULTURE (INFECTION SUSPECTED)  LACTIC ACID, PLASMA  BASIC METABOLIC PANEL WITH GFR  BASIC METABOLIC PANEL WITH GFR  BASIC METABOLIC PANEL WITH GFR  BETA-HYDROXYBUTYRIC ACID  BETA-HYDROXYBUTYRIC ACID  BETA-HYDROXYBUTYRIC ACID  URINALYSIS, ROUTINE W REFLEX MICROSCOPIC  BASIC METABOLIC PANEL WITH GFR  CBC  PROCALCITONIN  STREP PNEUMONIAE URINARY ANTIGEN  LEGIONELLA PNEUMOPHILA SEROGP 1 UR AG  CBG MONITORING, ED     I ordered and reviewed the above labs they are notable for mildly elevated white blood cell count of 10.6 and a normal-appearing VBG.  Lactic normal.  Hyperglycemia, anion gap metabolic acidosis     RADIOLOGY I independently reviewed and interpreted chest x-ray and see the right lower lobe opacity concerning for pneumonia I also reviewed radiologist's formal read.   PROCEDURES:  Critical Care performed: Yes, see critical care procedure note(s)  .Critical Care  Performed by: Cyrena Mylar, MD Authorized by: Cyrena Mylar, MD   Critical care provider statement:    Critical care time (minutes):  30   Critical care was time spent personally by me on the following activities:  Development of treatment plan with patient or surrogate, discussions with consultants, evaluation of patient's response to treatment, examination of patient, ordering and review of laboratory studies, ordering and review of radiographic studies, ordering and performing treatments and interventions, pulse oximetry, re-evaluation of patient's condition and review of old charts Comments:     DKA, sepsis    MEDICATIONS ORDERED IN ED: Medications  cefTRIAXone  (ROCEPHIN ) 2 g in sodium chloride  0.9 % 100 mL IVPB (has no administration in time range)  azithromycin  (ZITHROMAX ) 500 mg in sodium chloride  0.9 % 250 mL IVPB (has  no administration in time range)  insulin  regular, human (MYXREDLIN ) 100 units/ 100 mL infusion (has no administration in time range)  lactated ringers  infusion (has no administration in time range)  dextrose  5 % in lactated ringers  infusion (has no administration in time range)  dextrose  50 % solution 0-50 mL (has no administration in time range)  potassium chloride  10 mEq in 100  mL IVPB (has no administration in time range)  heparin  injection 5,000 Units (has no administration in time range)  acetaminophen  (TYLENOL ) tablet 650 mg (has no administration in time range)    Or  acetaminophen  (TYLENOL ) suppository 650 mg (has no administration in time range)  oxyCODONE  (Oxy IR/ROXICODONE ) immediate release tablet 5 mg (has no administration in time range)  ondansetron  (ZOFRAN ) tablet 4 mg (has no administration in time range)    Or  ondansetron  (ZOFRAN ) injection 4 mg (has no administration in time range)  ipratropium-albuterol  (DUONEB) 0.5-2.5 (3) MG/3ML nebulizer solution 3 mL (has no administration in time range)  guaiFENesin  (ROBITUSSIN) 100 MG/5ML liquid 5 mL (has no administration in time range)  lactated ringers  bolus 1,500 mL (1,500 mLs Intravenous New Bag/Given 12/21/23 9342)    External physician / consultants:  I spoke with hospital medicine for admission and regarding care plan for this patient.   IMPRESSION / MDM / ASSESSMENT AND PLAN / ED COURSE  I reviewed the triage vital signs and the nursing notes.                                Patient's presentation is most consistent with acute presentation with potential threat to life or bodily function.  Differential diagnosis includes, but is not limited to, respiratory infection, sepsis, asthma exacerbation, DKA, viral illness   The patient is on the cardiac monitor to evaluate for evidence of arrhythmia and/or significant heart rate changes.  MDM:    83 year old with worsening respiratory infectious symptoms after being  discharged from [facility] hospital for bronchitis and asthma exacerbation.  Today with evidence of pneumonia on chest x-ray, with vital sign abnormalities suggestive of sepsis, will get sepsis labs, fluid bolus (history of CHF but appears fluid down currently and will warrant fluids especially in light oflower blood pressure) community-acquired pneumonia covered with IV ceftriaxone  and azithromycin , as well as Endo tool management of mild DKA.  Awake alert comfortable and agreeable with plan.        FINAL CLINICAL IMPRESSION(S) / ED DIAGNOSES   Final diagnoses:  Chills  Sepsis, due to unspecified organism, unspecified whether acute organ dysfunction present [facility])  Community acquired pneumonia, unspecified laterality  Diabetic ketoacidosis without coma associated with type 2 diabetes mellitus (HCC)     Rx / DC Orders   ED Discharge Orders     None        Note:  This document was prepared using Dragon voice recognition software and may include unintentional dictation errors.    Cyrena Mylar, MD 12/21/23 9193    Cyrena Mylar, MD 12/21/23 [phone]  "

## 2023-12-21 NOTE — ED Notes (Signed)
 Neb complete, some wheezing continues. Alert, NAD, calm, interactive. BS 197. Clarifying orders.

## 2023-12-21 NOTE — ED Triage Notes (Addendum)
 Pt presents for generalized weakness. Recently seen and diagnosed with bronchitis. Stared on prednisone  among other medications. Tonight noted full body weakness and shakiness. BGL 341 for EMS. Pt states she forgot her sugar pill tonight. Denies falls. Alert and oriented. Endorsing abdominal pain last night with nausea that have both resolved

## 2023-12-21 NOTE — ED Notes (Signed)
 Blood drawn and sent, full rainbow to lab

## 2023-12-21 NOTE — Progress Notes (Signed)
 Pt being followed by [facility] for Sepsis protocol.

## 2023-12-21 NOTE — ED Notes (Signed)
 This tech and the nurse Norleen got the pt cleaned up and full lien change.

## 2023-12-21 NOTE — H&P (Addendum)
 "  History and Physical    Jacqueline Arroyo FMW:[id number] DOB: [birth year] DOA: 12/21/2023  DOS: the patient was seen and examined on 12/21/2023  PCP: [facility]   Patient coming from: Home  I have personally briefly reviewed patient's old medical records in [facility]  Chief Complaint: Cough and shortness of breath  HPI: Jacqueline Arroyo is a pleasant 83 y.o. female with medical history significant for mild on surveillance, diabetes, HTN, HLD, asthma, polycythemia vera, HFrEF with last EF was around 35 to 40% from echo in 2025 July, CKD who presented to ED at [facility] for worsening shortness of breath cough and generalized weakness.  Patient had a recent hospitalization at [facility] and was discharged from the hospital on 12/16/2023 after being diagnosed with bronchitis and exacerbation of asthma.  She presented on 12/19  with worsening respiratory symptoms after she went home along with some chills, generalized weakness and worsening nausea.  Patient denies fever but overall she was not feeling well since she was discharged from the hospital.  She denies any dysuria, hematuria, chest pain, palpitations, vomiting, diarrhea.  ED Course: Upon arrival to the ED, patient is found to be tachycardic at 121, leukocytes 10.9, hemoglobin 12 and 13, urine analysis negative for UTI, COVID flu and respiratory panel was negative, chest x-ray showed right-sided pneumonia.  EKG showed sinus rhythm with first-degree AV block.  It appears that she was treated with antibiotic azithromycin  and steroid prednisone .  Her blood sugars are high and she also had mild DKA.  Hospitalist service was consulted for evaluation for admission for community-acquired pneumonia, asthma exacerbation and failed outpatient treatment.  Review of Systems:  ROS  All other systems negative except as noted in the HPI.  Past Medical History:  Diagnosis Date   Diabetes mellitus without complication (HCC)    Hypertension      Past Surgical History:  Procedure Laterality Date   ABDOMINAL HYSTERECTOMY     BACK SURGERY     CHOLECYSTECTOMY     FOOT SURGERY       reports that she has never smoked. She has never used smokeless tobacco. She reports that she does not drink alcohol and does not use drugs.  Allergies[1]  History reviewed. No pertinent family history.  Prior to Admission medications  Medication Sig Start Date End Date Taking? Authorizing Provider  acetic acid 2 % otic solution SMARTSIG:Left Ear 01/19/21   [provider]  albuterol  (VENTOLIN  HFA) 108 (90 Base) MCG/ACT inhaler Inhale into the lungs. 07/12/19   [provider]  amLODipine  (NORVASC ) 10 MG tablet Take 10 mg by mouth daily. 09/01/20   [provider]  aspirin  81 MG EC tablet Take by mouth. 07/17/19   [provider]  atorvastatin  (LIPITOR) 10 MG tablet Take 1 tablet by mouth daily. 03/01/20   [provider]  azithromycin  (ZITHROMAX ) 250 MG tablet Take 1 tablet (250 mg total) by mouth daily. Take first 2 tablets together, then 1 every day until finished. 12/12/23   Arvis Huxley B, PA-C  benzonatate  (TESSALON ) 200 MG capsule Take 1 capsule (200 mg total) by mouth 3 (three) times daily as needed for cough. 12/12/23   Arvis Huxley NOVAK, PA-C  calcium -vitamin D (OSCAL WITH D) 500-200 MG-UNIT TABS tablet Take by mouth.    [provider]  Cyanocobalamin  2000 MCG TBCR Take by mouth.    [provider]  cycloSPORINE  (RESTASIS ) 0.05 % ophthalmic emulsion INSTILL 1 DROP INTO EACH EYE TWICE DAILY  01/29/20   [provider]  estradiol (ESTRACE) 0.1 MG/GM vaginal cream SMARTSIG:Gram(s) Vaginal 3 Times a Week 08/17/20   [provider]  fenofibrate (TRICOR) 145 MG tablet Take by mouth. 06/27/20   [provider]  FLOVENT  HFA 110 MCG/ACT inhaler SMARTSIG:2 Puff(s) By Mouth Twice Daily 05/09/20   [provider]  fluticasone  (FLONASE ) 50 MCG/ACT nasal spray 1 spray  into each nostril daily. 12/27/20 05/22/23  [provider]  furosemide  (LASIX ) 20 MG tablet Take by mouth. 07/23/19   [provider]  hydroxyurea (HYDREA) 500 MG capsule Take by mouth. 02/26/22   [provider]  ketoconazole (NIZORAL) 2 % cream Apply topically daily. 03/11/20   [provider]  Lancets (FREESTYLE) lancets Check blood sugar twice daily 12/27/20   [provider]  lisinopril (ZESTRIL) 30 MG tablet Take 1 tablet by mouth daily. 12/29/20 05/22/23  [provider]  LORazepam  (ATIVAN ) 0.5 MG tablet Take by mouth. 07/31/19   [provider]  meclizine (ANTIVERT) 12.5 MG tablet Take 12.5 mg by mouth 2 (two) times daily as needed. 01/18/21   [provider]  meloxicam (MOBIC) 7.5 MG tablet Take 1 tablet by mouth daily. 01/26/20   [provider]  metFORMIN (GLUCOPHAGE) 500 MG tablet Take 500 mg by mouth daily. 08/31/20   [provider]  methocarbamol  (ROBAXIN ) 500 MG tablet Take 1 tablet (500 mg total) by mouth 2 (two) times daily. 01/14/22   Brimage, Vondra, DO  metoprolol  succinate (TOPROL -XL) 50 MG 24 hr tablet Take 50 mg by mouth 2 (two) times daily. 09/08/19   [provider]  mupirocin  cream (BACTROBAN ) 2 % Apply 1 Application topically 2 (two) times daily. 07/07/22   Antonette Angeline ORN, NP  spironolactone (ALDACTONE) 25 MG tablet Take 25 mg by mouth daily. 08/06/20   [provider]  triamcinolone  cream (KENALOG ) 0.1 % Apply 1 Application topically 2 (two) times daily. 07/07/22   Antonette Angeline ORN, NP  UNABLE TO FIND Med Name: HCTZ 10 mg    [provider]    Physical Exam: Vitals:   12/21/23 1045 12/21/23 1100 12/21/23 1115 12/21/23 1130  BP: (!) 140/65 137/76 (!) 110/55 114/61  Pulse: 95 98 90 92  Resp: (!) 25 20    Temp:      TempSrc:      SpO2: 93% 94% 92% 94%  Weight:        Physical Exam   Constitutional: Alert, awake, calm, comfortable HEENT: Neck  supple Respiratory: Clear to auscultation B/L, no wheezing, no rales.  Cardiovascular: Regular rate and rhythm, no murmurs / rubs / gallops. No extremity edema. 2+ pedal pulses. No carotid bruits.  Abdomen: Soft, no tenderness, Bowel sounds positive.  Musculoskeletal: no clubbing / cyanosis. Good ROM, no contractures. Normal muscle tone.  Skin: no rashes, lesions, ulcers. Neurologic: CN 2-12 grossly intact. Sensation intact, No focal deficit identified Psychiatric: Alert and oriented x 3. Normal mood.    Labs on Admission: I have personally reviewed following labs and imaging studies  CBC: Recent Labs  Lab 12/21/23 0033 12/21/23 0652 12/21/23 0909  WBC 10.9* 10.6* 7.1  NEUTROABS 8.7* 8.2*  --   HGB 13.5 12.2 8.4*  HCT 40.5 38.3 27.0*  MCV 111.6* 113.0* 114.9*  PLT 225 182 117*   Basic Metabolic Panel: Recent Labs  Lab 12/21/23 0033 12/21/23 0652 12/21/23 0909  NA 131* 133* 136  K 4.6 4.3 4.8  CL 97* 99 101  CO2 17* 22 21*  GLUCOSE 277* 241* 181*  BUN 32* 40* 34*  CREATININE 1.22* 1.60* 1.33*  CALCIUM  9.4 9.5 8.7*   GFR: CrCl cannot be calculated (Unknown ideal weight.). Liver Function Tests: Recent Labs  Lab 12/21/23 0033  AST 13*  ALT 20  ALKPHOS 35*  BILITOT 0.8  PROT 7.1  ALBUMIN  3.9   No results for input(s): LIPASE, AMYLASE in the last 168 hours. No results for input(s): AMMONIA in the last 168 hours. Coagulation Profile: No results for input(s): INR, PROTIME in the last 168 hours. Cardiac Enzymes: No results for input(s): CKTOTAL, CKMB, CKMBINDEX, TROPONINI, TROPONINIHS in the last 168 hours. BNP (last 3 results) No results for input(s): BNP in the last 8760 hours. HbA1C: No results for input(s): HGBA1C in the last 72 hours. CBG: Recent Labs  Lab 12/21/23 0849 12/21/23 1146  GLUCAP 197* 184*   Lipid Profile: No results for input(s): CHOL, HDL, LDLCALC, TRIG, CHOLHDL, LDLDIRECT in the last 72  hours. Thyroid Function Tests: No results for input(s): TSH, T4TOTAL, FREET4, T3FREE, THYROIDAB in the last 72 hours. Anemia Panel: No results for input(s): VITAMINB12, FOLATE, FERRITIN, TIBC, IRON, RETICCTPCT in the last 72 hours. Urine analysis:    Component Value Date/Time   COLORURINE YELLOW (A) 12/21/2023 0909   APPEARANCEUR CLEAR (A) 12/21/2023 0909   LABSPEC 1.009 12/21/2023 0909   PHURINE 6.0 12/21/2023 0909   GLUCOSEU NEGATIVE 12/21/2023 0909   HGBUR NEGATIVE 12/21/2023 0909   BILIRUBINUR NEGATIVE 12/21/2023 0909   KETONESUR NEGATIVE 12/21/2023 0909   PROTEINUR NEGATIVE 12/21/2023 0909   NITRITE NEGATIVE 12/21/2023 0909   LEUKOCYTESUR NEGATIVE 12/21/2023 9090    Radiological Exams on Admission: I have personally reviewed images DG Chest 2 View Result Date: 12/21/2023 CLINICAL DATA:  Shortness of breath.  Weakness. EXAM: CHEST - 2 VIEW COMPARISON:  12/12/2023 FINDINGS: Stable heart size and mediastinal contours. Chronic elevation of right hemidiaphragm. Small opacity adjacent to elevated diaphragm was not seen on prior exam. No pulmonary edema, pleural effusion, or pneumothorax. Lower thoracic kyphoplasty again seen. IMPRESSION: Small opacity adjacent to elevated right hemidiaphragm was not seen on prior exam, atelectasis versus pneumonia. Electronically Signed   By: Andrea Gasman M.D.   On: 12/21/2023 01:17    EKG: My personal interpretation of EKG shows: Sinus rhythm, first-degree AV block, no ST elevation    Assessment/Plan Principal Problem:   CAP (community acquired pneumonia) Active Problems:   HFrEF (heart failure with reduced ejection fraction) (HCC)   HTN (hypertension)   HLD (hyperlipidemia)   DM (diabetes mellitus) (HCC)   Asthma, chronic   Polycythemia vera (HCC)    Assessment and Plan: 83 year old female with past medical history of asthma, HTN, HLD, DM, polycythemia vera, HFrEF who was recently admitted and discharged from [facility] on 12/15 for bronchitis, acute exacerbation of asthma.  She was treated with azithromycin  and prednisone .  1.  Likely community-acquired pneumonia - Patient was recently treated with azithromycin  and steroid prednisone  at [facility]. - She has a persistent cough, shortness of breath - She will be admitted to the hospital as inpatient - She has been started on ceftriaxone  and azithromycin  for community-acquired pneumonia. - A steroid will not be given as patient has been complicated with worsening blood sugars - She will be given nebulization - I have sent urine Legionella, streptococcal pneumonia antigen, procalcitonin - Follow-up cultures - Continue oxygen to maintain saturation more than 90%  2.  Asthma exacerbation - Patient was treated with prednisone  and azithromycin  - Will continue antibiotics -  Continue nebulization - I will not start her on steroid  3.  Type 2 diabetes - Will continue her insulin  sliding scale - Continue to monitor blood sugars - Initially it was thought that she may need insulin  drip but her numbers improved and there was no severe acidosis.  This was just maybe steroid-induced hyperglycemia. - Will continue to monitor blood sugars and BMP.  4.  Polycythemia vera - Hemoglobin hematocrit has been in the lower range today - Continue to monitor - Will hold off hydroxyurea due to concern for anemia - Continue to give her oxygen to maintain saturation more than 90%  5.  HTN/HLD - Resume her home medications - Continue to monitor blood pressure  6.  HFrEF - Does not appear to be in exacerbation - Continue monitor volume status - Will hold off diuretics due to possible sepsis, pneumonia at this point. - She normally takes 20 mg of Lasix  p.o. daily. -Will resume her diuretics as appropriate  7.  Anemia in a patient who has a polycythemia vera - Patient was short of breath. - Will repeat CBC - Will check stool for occult blood - Will give her  Protonix  IV twice daily - If she has a low hemoglobin hematocrit then we may need to watch closely up to the point that she may need transfusion. - Will get type and screen  8.  Deconditioning -Will get her PT/OT  9.  AKI - Creatinine is 1.60 and 1.33 - Baseline was 0.97 - Continue to monitor kidney function - Hold diuretics at this point    DVT prophylaxis: Lovenox Code Status: DNR/DNI Family Communication: None Disposition Plan: Home Consults called: None Admission status: Inpatient, Step Down Unit   Nena Rebel, MD [facility] 12/21/2023, 12:05 PM        [1]  Allergies Allergen Reactions   Amoxicillin  Other (See Comments)    Other Reaction(s): Unknown   Cephalexin Palpitations   Ciprofloxacin Palpitations    Legacy System: Delight    Onset Date: <blank>    Substance Legacy/Cerner: Cipro / Cipro (Legacy value)    Category: Drug    Severity Legacy/Cerner: <blank> / Unknown    Reaction(s): makes head feel funny  nausea    Comments: <blank>    Legacy System: Frye  Onset Date: <blank>  Substance Legacy/Cerner: Cipro / Cipro (Legacy value)  Category: Drug  Severity Legacy/Cerner: <blank> / Unknown  Reaction(s): makes head feel funny  nausea  Comments: <blank>   Hydrocodone-Acetaminophen  Nausea And Vomiting, Nausea Only and Other (See Comments)   Sulfamethoxazole Palpitations   "

## 2023-12-22 DIAGNOSIS — J189 Pneumonia, unspecified organism: Secondary | ICD-10-CM | POA: Diagnosis not present

## 2023-12-22 LAB — GLUCOSE, CAPILLARY
Glucose-Capillary: 158 mg/dL — ABNORMAL HIGH (ref 70–99)
Glucose-Capillary: 194 mg/dL — ABNORMAL HIGH (ref 70–99)
Glucose-Capillary: 394 mg/dL — ABNORMAL HIGH (ref 70–99)

## 2023-12-22 LAB — PROTIME-INR
INR: 1.1 (ref 0.8–1.2)
Prothrombin Time: 14.6 s (ref 11.4–15.2)

## 2023-12-22 LAB — COMPREHENSIVE METABOLIC PANEL WITH GFR
ALT: 14 U/L (ref 0–44)
AST: 13 U/L — ABNORMAL LOW (ref 15–41)
Albumin: 3 g/dL — ABNORMAL LOW (ref 3.5–5.0)
Alkaline Phosphatase: 37 U/L — ABNORMAL LOW (ref 38–126)
Anion gap: 13 (ref 5–15)
BUN: 36 mg/dL — ABNORMAL HIGH (ref 8–23)
CO2: 20 mmol/L — ABNORMAL LOW (ref 22–32)
Calcium: 8.8 mg/dL — ABNORMAL LOW (ref 8.9–10.3)
Chloride: 101 mmol/L (ref 98–111)
Creatinine, Ser: 1.38 mg/dL — ABNORMAL HIGH (ref 0.44–1.00)
GFR, Estimated: 37 mL/min — ABNORMAL LOW
Glucose, Bld: 155 mg/dL — ABNORMAL HIGH (ref 70–99)
Potassium: 4.5 mmol/L (ref 3.5–5.1)
Sodium: 133 mmol/L — ABNORMAL LOW (ref 135–145)
Total Bilirubin: 0.4 mg/dL (ref 0.0–1.2)
Total Protein: 6.1 g/dL — ABNORMAL LOW (ref 6.5–8.1)

## 2023-12-22 LAB — CBC
HCT: 35.6 % — ABNORMAL LOW (ref 36.0–46.0)
Hemoglobin: 11 g/dL — ABNORMAL LOW (ref 12.0–15.0)
MCH: 35.8 pg — ABNORMAL HIGH (ref 26.0–34.0)
MCHC: 30.9 g/dL (ref 30.0–36.0)
MCV: 116 fL — ABNORMAL HIGH (ref 80.0–100.0)
Platelets: 163 K/uL (ref 150–400)
RBC: 3.07 MIL/uL — ABNORMAL LOW (ref 3.87–5.11)
RDW: 13 % (ref 11.5–15.5)
WBC: 7 K/uL (ref 4.0–10.5)
nRBC: 0 % (ref 0.0–0.2)

## 2023-12-22 LAB — D-DIMER, QUANTITATIVE: D-Dimer, Quant: 3.37 ug{FEU}/mL — ABNORMAL HIGH (ref 0.00–0.50)

## 2023-12-22 LAB — PRO BRAIN NATRIURETIC PEPTIDE: Pro Brain Natriuretic Peptide: 418 pg/mL — ABNORMAL HIGH

## 2023-12-22 MED ORDER — FLUTICASONE FUROATE-VILANTEROL 100-25 MCG/ACT IN AEPB
1.0000 | INHALATION_SPRAY | Freq: Every day | RESPIRATORY_TRACT | Status: DC
  Administered 2023-12-23 – 2024-01-15 (×24): 1 via RESPIRATORY_TRACT
  Filled 2023-12-22 (×2): qty 28

## 2023-12-22 MED ORDER — IPRATROPIUM-ALBUTEROL 0.5-2.5 (3) MG/3ML IN SOLN
3.0000 mL | Freq: Once | RESPIRATORY_TRACT | Status: AC
  Administered 2023-12-22: 3 mL via RESPIRATORY_TRACT
  Filled 2023-12-22: qty 3

## 2023-12-22 MED ORDER — PREDNISONE 20 MG PO TABS
20.0000 mg | ORAL_TABLET | Freq: Every day | ORAL | Status: AC
  Administered 2023-12-22 – 2023-12-25 (×4): 20 mg via ORAL
  Filled 2023-12-22 (×4): qty 1

## 2023-12-22 MED ORDER — PREDNISONE 20 MG PO TABS: 20.0000 mg | ORAL_TABLET | Freq: Every day | ORAL | Status: DC

## 2023-12-22 MED ORDER — LORAZEPAM 0.5 MG PO TABS
0.2500 mg | ORAL_TABLET | Freq: Two times a day (BID) | ORAL | Status: DC
  Administered 2023-12-22 – 2024-01-15 (×49): 0.25 mg via ORAL
  Filled 2023-12-22 (×35): qty 1

## 2023-12-22 MED ORDER — LIDOCAINE 5 % EX PTCH
1.0000 | MEDICATED_PATCH | CUTANEOUS | Status: DC
  Administered 2023-12-22 – 2024-01-15 (×21): 1 via TRANSDERMAL
  Filled 2023-12-22 (×19): qty 1

## 2023-12-22 NOTE — Progress Notes (Signed)
 " Progress Note   Patient: Jacqueline Arroyo FMW:[id number] DOB: 12-10-1940 DOA: 12/21/2023     1 DOS: the patient was seen and examined on 12/22/2023   Brief hospital course: Per H&P HPI   HPI: Jacqueline Arroyo is a pleasant 83 y.o. female with medical history significant for mild on surveillance, diabetes, HTN, HLD, asthma, polycythemia vera, HFrEF with last EF was around 35 to 40% from echo in 2025 July, CKD who presented to ED at [facility] for worsening shortness of breath cough and generalized weakness.  Patient had a recent hospitalization at [facility] and was discharged from the hospital on 12/16/2023 after being diagnosed with bronchitis and exacerbation of asthma.  She presented on 12/19  with worsening respiratory symptoms after she went home along with some chills, generalized weakness and worsening nausea.  Patient denies fever but overall she was not feeling well since she was discharged from the hospital.  She denies any dysuria, hematuria, chest pain, palpitations, vomiting, diarrhea.   ED Course: Upon arrival to the ED, patient is found to be tachycardic at 121, leukocytes 10.9, hemoglobin 12 and 13, urine analysis negative for UTI, COVID flu and respiratory panel was negative, chest x-ray showed right-sided pneumonia.  EKG showed sinus rhythm with first-degree AV block.  It appears that she was treated with antibiotic azithromycin  and steroid prednisone .  Her blood sugars are high and she also had mild DKA.  Hospitalist service was consulted for evaluation for admission for community-acquired pneumonia, asthma exacerbation and failed outpatient treatment.  Assessment and Plan:  Possible CAP  Asthma exacerbation  Metapnemovirus + Patient recently treated at OSH for asthma exacerbation and viral bronchitis (metapneumovirus +). She was discharged with albuterol  and steroid course. Patient had worsening shortness of breath prompting her to present to [facility]. D dimer 3.37 micrograms/mL, at OSH  0.902 micrograms/mL at OSH. ProBNP mildly elevated.  Procalcitonin elevated. CXR with small opacity adjacent to elevated diaphragm.   Plan:  NM scan to check for PE given worsening shortness of breath and renal dysfunction at baseline.  Continue to treat for CAP  Resume steroids  Start LABA/ICS, possibly d/c steroids in the AM, as patient has had steroid induced hyperglycemia  PRN nebs    T2DM  Worsened in setting of steroids.  3u LA + SSI    H/o Polycythemia vera     Latest Ref Rng & Units 12/22/2023    4:46 AM 12/21/2023    2:53 PM 12/21/2023    9:09 AM  CBC  WBC 4.0 - 10.5 K/uL 7.0  9.1  7.1   Hemoglobin 12.0 - 15.0 g/dL 88.9  88.2  8.4   Hematocrit 36.0 - 46.0 % 35.6  36.1  27.0   Platelets 150 - 400 K/uL 163  163  117   Hgb stable. Noted to be 12-13 during recent hospitalization. No obvious bleeding noted.  Hold home hydroxyurea  Check iron panel. F/u stool occult  Receiving IV PPI BID, can likely switch to PO daily dosing in the AM   HTN/HLD Hold home amlodipine , losartan given low normal blood pressures    HFrEF Appears euvolemic. BNP mildly elevated though improved from recent hospitalization. On ARB, BB, and lasix  20mg  daily.  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  Continue to hold IV fluids BL scr ~1.07      Subjective: SOB improved from when first presented.   Physical Exam: Vitals:  12/21/23 2330 12/22/23 0000 12/22/23 0433 12/22/23 0809  BP: (!) 98/57 (!) 94/50 (!) 147/79 108/62  Pulse: 86 85 85 83  Resp: 20 18 20 17   Temp:  98.7 F (37.1 C) (!) 97.5 F (36.4 C) 97.9 F (36.6 C)  TempSrc:  Oral Oral Oral  SpO2: 95% 95% 97% 97%  Weight:       Physical Exam  Constitutional: In no distress.  Cardiovascular: systolic murmur. Normal rate, regular rhythm. No lower extremity edema  Pulmonary: Non labored breathing on [place], Diffuse wheezing Abdominal: Soft. Non distended and non  tender Musculoskeletal: Normal range of motion.     Neurological: Alert and oriented to person, place, and time. Non focal  Skin: Skin is warm and dry.   Data Reviewed:     Latest Ref Rng & Units 12/22/2023    4:46 AM 12/21/2023    5:52 PM 12/21/2023    2:53 PM  BMP  Glucose 70 - 99 mg/dL 844  740  816   BUN 8 - 23 mg/dL 36  32  31   Creatinine 0.44 - 1.00 mg/dL 8.61  8.60  8.67   Sodium 135 - 145 mmol/L 133  134  135   Potassium 3.5 - 5.1 mmol/L 4.5  4.9  4.5   Chloride 98 - 111 mmol/L 101  101  101   CO2 22 - 32 mmol/L 20  21  22    Calcium  8.9 - 10.3 mg/dL 8.8  8.7  9.1       Latest Ref Rng & Units 12/22/2023    4:46 AM 12/21/2023    2:53 PM 12/21/2023    9:09 AM  CBC  WBC 4.0 - 10.5 K/uL 7.0  9.1  7.1   Hemoglobin 12.0 - 15.0 g/dL 88.9  88.2  8.4   Hematocrit 36.0 - 46.0 % 35.6  36.1  27.0   Platelets 150 - 400 K/uL 163  163  117      Family Communication: None at bedside   Disposition: Status is: Inpatient Remains inpatient appropriate because: treatment of dyspnea.   Planned Discharge Destination: Home    Time spent: 35 minutes  Author: Alban Pepper, MD 12/22/2023 10:40 AM  For on call review [email or web address].  "

## 2023-12-23 ENCOUNTER — Inpatient Hospital Stay

## 2023-12-23 ENCOUNTER — Other Ambulatory Visit (HOSPITAL_COMMUNITY): Payer: Self-pay

## 2023-12-23 ENCOUNTER — Telehealth (HOSPITAL_COMMUNITY): Payer: Self-pay

## 2023-12-23 DIAGNOSIS — D45 Polycythemia vera: Secondary | ICD-10-CM | POA: Diagnosis not present

## 2023-12-23 DIAGNOSIS — J189 Pneumonia, unspecified organism: Secondary | ICD-10-CM | POA: Diagnosis not present

## 2023-12-23 LAB — LEGIONELLA PNEUMOPHILA SEROGP 1 UR AG: L. pneumophila Serogp 1 Ur Ag: NEGATIVE

## 2023-12-23 LAB — GLUCOSE, CAPILLARY
Glucose-Capillary: 199 mg/dL — ABNORMAL HIGH (ref 70–99)
Glucose-Capillary: 246 mg/dL — ABNORMAL HIGH (ref 70–99)
Glucose-Capillary: 308 mg/dL — ABNORMAL HIGH (ref 70–99)
Glucose-Capillary: 274 mg/dL — ABNORMAL HIGH (ref 70–99)

## 2023-12-23 LAB — BASIC METABOLIC PANEL WITH GFR
Anion gap: 10 (ref 5–15)
BUN: 39 mg/dL — ABNORMAL HIGH (ref 8–23)
CO2: 20 mmol/L — ABNORMAL LOW (ref 22–32)
Calcium: 8.8 mg/dL — ABNORMAL LOW (ref 8.9–10.3)
Chloride: 103 mmol/L (ref 98–111)
Creatinine, Ser: 1.19 mg/dL — ABNORMAL HIGH (ref 0.44–1.00)
GFR, Estimated: 45 mL/min — ABNORMAL LOW
Glucose, Bld: 255 mg/dL — ABNORMAL HIGH (ref 70–99)
Potassium: 4.9 mmol/L (ref 3.5–5.1)
Sodium: 133 mmol/L — ABNORMAL LOW (ref 135–145)

## 2023-12-23 LAB — IRON AND TIBC
Iron: 46 ug/dL (ref 28–170)
Saturation Ratios: 22 % (ref 10.4–31.8)
TIBC: 211 ug/dL — ABNORMAL LOW (ref 250–450)
UIBC: 165 ug/dL

## 2023-12-23 LAB — CBC
HCT: 31.6 % — ABNORMAL LOW (ref 36.0–46.0)
Hemoglobin: 10.2 g/dL — ABNORMAL LOW (ref 12.0–15.0)
MCH: 35.7 pg — ABNORMAL HIGH (ref 26.0–34.0)
MCHC: 32.3 g/dL (ref 30.0–36.0)
MCV: 110.5 fL — ABNORMAL HIGH (ref 80.0–100.0)
Platelets: 175 K/uL (ref 150–400)
RBC: 2.86 MIL/uL — ABNORMAL LOW (ref 3.87–5.11)
RDW: 12.3 % (ref 11.5–15.5)
WBC: 6.8 K/uL (ref 4.0–10.5)
nRBC: 0 % (ref 0.0–0.2)

## 2023-12-23 LAB — MAGNESIUM: Magnesium: 2.1 mg/dL (ref 1.7–2.4)

## 2023-12-23 LAB — FERRITIN: Ferritin: 941 ng/mL — ABNORMAL HIGH (ref 11–307)

## 2023-12-23 MED ORDER — CYCLOSPORINE 0.05 % OP EMUL
1.0000 [drp] | Freq: Two times a day (BID) | OPHTHALMIC | Status: DC
  Administered 2023-12-23 – 2024-01-15 (×46): 1 [drp] via OPHTHALMIC
  Filled 2023-12-23 (×32): qty 30

## 2023-12-23 MED ORDER — PANTOPRAZOLE SODIUM 40 MG PO TBEC
40.0000 mg | DELAYED_RELEASE_TABLET | Freq: Two times a day (BID) | ORAL | Status: DC
  Administered 2023-12-23 – 2024-01-15 (×46): 40 mg via ORAL
  Filled 2023-12-23 (×32): qty 1

## 2023-12-23 MED ORDER — AZITHROMYCIN 250 MG PO TABS
500.0000 mg | ORAL_TABLET | Freq: Every day | ORAL | Status: AC
  Administered 2023-12-24 – 2023-12-25 (×2): 500 mg via ORAL
  Filled 2023-12-23 (×2): qty 2

## 2023-12-23 MED ORDER — TECHNETIUM TO 99M ALBUMIN AGGREGATED
4.4300 | Freq: Once | INTRAVENOUS | Status: AC | PRN
  Administered 2023-12-23: 4.43 via INTRAVENOUS

## 2023-12-23 MED ORDER — INSULIN ASPART 100 UNIT/ML IJ SOLN
5.0000 [IU] | Freq: Three times a day (TID) | INTRAMUSCULAR | Status: DC
  Administered 2023-12-23 – 2024-01-05 (×39): 5 [IU] via SUBCUTANEOUS
  Filled 2023-12-23 (×20): qty 5

## 2023-12-23 NOTE — Care Management Important Message (Signed)
 Important Message  Patient Details  Name: Jacqueline Arroyo MRN: [id number] Date of Birth: 09-Jan-1940   Important Message Given:  Yes - Medicare IM     Jacqueline Arroyo 12/23/2023, 4:45 PM

## 2023-12-23 NOTE — Evaluation (Signed)
 Physical Therapy Evaluation Patient Details Name: Jacqueline Arroyo MRN: [id number] DOB: Jul 28, 1940 Today's Date: 12/23/2023  History of Present Illness  Pt is a pleasant 83 y.o. female with medical history significant for diabetes, HTN, HLD, asthma, polycythemia vera, HFrEF with last EF was around 35 to 40% from echo in 2025 July, CKD who presented to ED at [facility] for worsening shortness of breath cough and generalized weakness. MD assessment includes:  Possible CAP, asthma exacerbation, metapnemovirus, AKI.   Clinical Impression  Pt was pleasant and motivated to participate during the session and put forth good effort throughout. Pt required extra time and effort with transfers along with cuing for proper sequencing but no physical assist.  Pt was highly antalgic to her L ankle that the pt reported as being chronic and despite being pre-medicated had difficulty putting meaningful weight on her LLE.   Step-to pattern sequencing education provided with pt only able to take several steps near the recliner before returning to sitting.  SpO2 on 2L was in the mid 90s at rest and dropped to a low of 89% after taking steps with HR WNL and with SpO2 quickly returning to baseline once back in sitting.  Pt reported min SOB with exertion that also quickly resolved in sitting.  Pt will benefit from continued PT services upon discharge to safely address deficits listed in patient problem list for decreased caregiver assistance and eventual return to PLOF.          If plan is discharge home, recommend the following: A lot of help with walking and/or transfers;A little help with bathing/dressing/bathroom;Assistance with cooking/housework;Assist for transportation   Can travel by private vehicle   No    Equipment Recommendations None recommended by PT  Recommendations for Other Services       Functional Status Assessment Patient has had a recent decline in their functional status and demonstrates the ability to  make significant improvements in function in a reasonable and predictable amount of time.     Precautions / Restrictions Precautions Precautions: Fall Restrictions Weight Bearing Restrictions Per Provider Order: No Other Position/Activity Restrictions: Chronic L ankle pain      Mobility  Bed Mobility               General bed mobility comments: NT, pt found out of bed in recliner    Transfers Overall transfer level: Needs assistance Equipment used: Rolling walker (2 wheels) Transfers: Sit to/from Stand Sit to Stand: Contact guard assist           General transfer comment: Min verbal cues for hand placement with min extra time and effort to come to standing and with poor eccentric control during stand to sit    Ambulation/Gait Ambulation/Gait assistance: Contact guard assist Gait Distance (Feet): 5 Feet Assistive device: Rolling walker (2 wheels) Gait Pattern/deviations: Step-to pattern, Antalgic, Decreased step length - right, Decreased stance time - left Gait velocity: decreased     General Gait Details: Highly antalgic L ankle with WB with step-to sequencing education provided to address pain  Stairs            Wheelchair Mobility     Tilt Bed    Modified Rankin (Stroke Patients Only)       Balance Overall balance assessment: Needs assistance   Sitting balance-Leahy Scale: Normal     Standing balance support: Bilateral upper extremity supported, During functional activity Standing balance-Leahy Scale: Good  Pertinent Vitals/Pain Pain Assessment Pain Assessment: 0-10 Pain Score: 10-Worst pain ever Pain Location: L ankle Pain Descriptors / Indicators: Aching, Sore Pain Intervention(s): Repositioned, Monitored during session, Patient requesting pain meds-RN notified, RN gave pain meds during session    Home Living Family/patient expects to be discharged to:: Private residence Living  Arrangements: Alone Available Help at Discharge: Family;Available PRN/intermittently Type of Home: Independent living facility Home Access: Level entry       Home Layout: One level Home Equipment: Agricultural Consultant (2 wheels);Rollator (4 wheels);Cane - quad;Cane - single point;Shower seat;Grab bars - tub/shower;Grab bars - toilet Additional Comments: Has call bell for assistance in bedroom and BR    Prior Function Prior Level of Function : Independent/Modified Independent             Mobility Comments: Ind amb in the home without an AD, QC or shopping cart in the community, no fall history ADLs Comments: Ind with ADLs     Extremity/Trunk Assessment   Upper Extremity Assessment Upper Extremity Assessment: Overall WFL for tasks assessed    Lower Extremity Assessment Lower Extremity Assessment: Generalized weakness;LLE deficits/detail LLE Deficits / Details: L ankle pain with weight bearing LLE: Unable to fully assess due to pain       Communication   Communication Communication: No apparent difficulties    Cognition Arousal: Alert Behavior During Therapy: WFL for tasks assessed/performed   PT - Cognitive impairments: No apparent impairments                         Following commands: Intact       Cueing Cueing Techniques: Verbal cues, Visual cues     General Comments      Exercises     Assessment/Plan    PT Assessment Patient needs continued PT services  PT Problem List Decreased strength;Decreased activity tolerance;Decreased balance;Decreased mobility;Decreased knowledge of use of DME;Pain       PT Treatment Interventions DME instruction;Gait training;Functional mobility training;Therapeutic activities;Therapeutic exercise;Balance training;Patient/family education    PT Goals (Current goals can be found in the Care Plan section)  Acute Rehab PT Goals Patient Stated Goal: To feel better and return home PT Goal Formulation: With patient Time  For Goal Achievement: 01/05/24 Potential to Achieve Goals: Good    Frequency Min 2X/week     Co-evaluation               AM-PAC PT 6 Clicks Mobility  Outcome Measure Help needed turning from your back to your side while in a flat bed without using bedrails?: A Little Help needed moving from lying on your back to sitting on the side of a flat bed without using bedrails?: A Little Help needed moving to and from a bed to a chair (including a wheelchair)?: A Little Help needed standing up from a chair using your arms (e.g., wheelchair or bedside chair)?: A Little Help needed to walk in hospital room?: A Lot Help needed climbing 3-5 steps with a railing? : Total 6 Click Score: 15    End of Session Equipment Utilized During Treatment: Gait belt Activity Tolerance: Patient limited by pain Patient left: in chair;with call bell/phone within reach;with chair alarm set Nurse Communication: Mobility status;Other (comment) (Nsg and MD notified of SpO2 with exertion and L ankle pain) PT Visit Diagnosis: Difficulty in walking, not elsewhere classified (R26.2);Muscle weakness (generalized) (M62.81);Pain Pain - Right/Left: Left Pain - part of body: Ankle and joints of foot    Time: [phone]  PT Time Calculation (min) (ACUTE ONLY): 35 min   Charges:   PT Evaluation $PT Eval Moderate Complexity: 1 Mod PT Treatments $Therapeutic Activity: 8-22 mins PT General Charges $$ ACUTE PT VISIT: 1 Visit    D. Scott Konya Fauble PT, DPT 12/23/2023, 10:55 AM

## 2023-12-23 NOTE — Progress Notes (Signed)
 PHARMACIST - PHYSICIAN COMMUNICATION  DR:   Jerelene  CONCERNING: IV to Oral Route Change Policy  RECOMMENDATION: This patient is receiving pantoprazole  by the intravenous route.  Based on criteria approved by the Pharmacy and Therapeutics Committee, the intravenous medication(s) is/are being converted to the equivalent oral dose form(s).   DESCRIPTION: These criteria include: The patient is eating (either orally or via tube) and/or has been taking other orally administered medications for a least 24 hours The patient has no evidence of active gastrointestinal bleeding or impaired GI absorption (gastrectomy, short bowel, patient on TNA or NPO).  Henritta Mutz Rodriguez-Guzman PharmD, BCPS 12/23/2023 12:23 PM

## 2023-12-23 NOTE — Telephone Encounter (Signed)
 Pharmacy Patient Advocate Encounter  Insurance verification completed.    The patient is insured through [employer]. Patient has Medicare and is not eligible for a copay card, but may be able to apply for patient assistance or Medicare RX Payment Plan (Patient Must reach out to their plan, if eligible for payment plan), if available.    Ran test claim for Breo Ellipta  200-54mcg and the current 30 day co-pay is $0.   This test claim was processed through [facility]- copay amounts may vary at other pharmacies due to [employer], or as the patient moves through the different stages of their insurance plan.

## 2023-12-23 NOTE — Progress Notes (Signed)
 " Progress Note   Patient: Jacqueline Arroyo FMW:[id number] DOB: [birth year] DOA: 12/21/2023     2 DOS: the patient was seen and examined on 12/23/2023   Brief hospital course: Jacqueline Arroyo is a pleasant 83 y.o. female with medical history significant for mild on surveillance, diabetes, HTN, HLD, asthma, polycythemia vera, HFrEF with last EF was around 35 to 40% from echo in 2025 July, CKD who presented to ED at [facility] for worsening shortness of breath cough and generalized weakness.  Patient had a recent hospitalization at [name] and was discharged from the hospital on 12/16/2023 after being diagnosed with bronchitis and exacerbation of asthma.  She presented on 12/19  with worsening respiratory symptoms after she went home along with some chills, generalized weakness and worsening nausea.  Admitted for possible community-acquired pneumonia, asthma exacerbation, metapneumovirus infection.  Hospital course as below  Assessment and Plan:  Possible CAP  Asthma exacerbation  Metapnemovirus + Patient recently treated at OSH for asthma exacerbation and viral bronchitis (metapneumovirus +). She was discharged with albuterol  and steroid course Presented with worsening SOB, elevated D-dimer. Procalcitonin elevated. CXR with small opacity adjacent to elevated diaphragm.  Pending NM scan to check for PE given worsening shortness of breath and renal dysfunction at baseline.  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 Noted to be 12-13 during recent hospitalization. No obvious bleeding noted,  Iron panel consistent with ACD Hold home hydroxyurea  PPI BID  HTN/HLD Hold home amlodipine , losartan given low  normal blood pressures   Chronic HFrEF Appears euvolemic. BNP mildly elevated though improved from recent hospitalization. On ARB, BB, and lasix  20mg  daily.  -Will resume BB, hold ARB and lasix  due to AKI  Left ankle osteoarthritis Has chronic pain, pain management  PT recommend SNF, patient declined would like to go home with home PT/OT    Subjective: Reports symptoms slightly better, improving. Creatinine improving, pending perfusion scan read. Started Restasis  eyedrops, will hold hydroxyurea due to hemoglobin 10.2  Physical Exam: Vitals:   12/22/23 2110 12/22/23 2317 12/23/23 0340 12/23/23 0816  BP: (!) 115/45 100/63 (!) 126/59 (!) 122/55  Pulse: 96 95 84 81  Resp: 20 20 18 17   Temp: 98 F (36.7 C) 97.7 F (36.5 C) 97.7 F (36.5 C) 97.6 F (36.4 C)  TempSrc:  Oral Oral Oral  SpO2: 100% 94% 99% 100%  Weight:       Physical Exam  Constitutional: In no distress.  Cardiovascular: systolic murmur. Normal rate, regular rhythm. No lower extremity edema  Pulmonary: Non labored breathing on [place], mild wheezing +  Abdominal: Soft. Non distended and non tender Musculoskeletal: Normal range of motion.     Neurological: Alert and oriented to person, place, and time. Non focal  Skin: Skin is warm and dry.   Data Reviewed:     Latest Ref Rng & Units 12/23/2023    4:15 AM 12/22/2023    4:46 AM 12/21/2023    5:52 PM  BMP  Glucose 70 - 99 mg/dL 744  844  740   BUN 8 - 23 mg/dL 39  36  32   Creatinine 0.44 - 1.00 mg/dL 8.80  8.61  8.60   Sodium 135 -  145 mmol/L 133  133  134   Potassium 3.5 - 5.1 mmol/L 4.9  4.5  4.9   Chloride 98 - 111 mmol/L 103  101  101   CO2 22 - 32 mmol/L 20  20  21    Calcium  8.9 - 10.3 mg/dL 8.8  8.8  8.7       Latest Ref Rng & Units 12/23/2023    4:15 AM 12/22/2023    4:46 AM 12/21/2023    2:53 PM  CBC  WBC 4.0 - 10.5 K/uL 6.8  7.0  9.1   Hemoglobin 12.0 - 15.0 g/dL 89.7  88.9  88.2   Hematocrit 36.0 - 46.0 % 31.6  35.6  36.1   Platelets 150 -  400 K/uL 175  163  163      Family Communication: None at bedside   Disposition: Status is: Inpatient Remains inpatient appropriate because: treatment of dyspnea.   Planned Discharge Destination: Home    Time spent: 56 minutes  Author: Laree Lock, MD 12/23/2023 9:15 AM  For on call review [email or web address].  "

## 2023-12-23 NOTE — Inpatient Diabetes Management (Addendum)
 Inpatient Diabetes Program Recommendations  AACE/ADA: New Consensus Statement on Inpatient Glycemic Control (2015)  Target Ranges:  Prepandial:   less than 140 mg/dL      Peak postprandial:   less than 180 mg/dL (1-2 hours)      Critically ill patients:  140 - 180 mg/dL    Latest Reference Range & Units 12/23/23 08:14 12/23/23 11:28  Glucose-Capillary 70 - 99 mg/dL 800 (H)  5 units Novolog   246 (H)  4 units Novolog    (H): Data is abnormally high    Home DM Meds Janumet 50/500 2 tablets daily   Current Orders: Novolog  0-9 units TID ac/hs   Novolog  3 units MC   Prednisone  20 mg daily    MD- While pt remains on Prednisone  and home oral DM meds are on hold, please consider Increasing Novolog  Meal Coverage to 5 units TID with meals     --Will follow patient during hospitalization--  Adina Rudolpho Arrow RN, MSN, CDCES Diabetes Coordinator Inpatient Glycemic Control Team Team Pager: [phone] (8a-5p)

## 2023-12-23 NOTE — Progress Notes (Signed)
 PHARMACIST - PHYSICIAN COMMUNICATION DR:   Jerelene CONCERNING: Antibiotic IV to Oral Route Change Policy  RECOMMENDATION: This patient is receiving Azithromycin  by the intravenous route.  Based on criteria approved by the Pharmacy and Therapeutics Committee, the antibiotic(s) is/are being converted to the equivalent oral dose form(s).   DESCRIPTION: These criteria include: Patient being treated for a respiratory tract infection, urinary tract infection, cellulitis or clostridium difficile associated diarrhea if on metronidazole The patient is not neutropenic and does not exhibit a GI malabsorption state The patient is eating (either orally or via tube) and/or has been taking other orally administered medications for a least 24 hours The patient is improving clinically and has a Tmax < 100.5   Jacqueline Arroyo PharmD, BCPS 12/23/2023 12:21 PM

## 2023-12-24 ENCOUNTER — Inpatient Hospital Stay

## 2023-12-24 DIAGNOSIS — J189 Pneumonia, unspecified organism: Secondary | ICD-10-CM | POA: Diagnosis not present

## 2023-12-24 DIAGNOSIS — D45 Polycythemia vera: Secondary | ICD-10-CM | POA: Diagnosis not present

## 2023-12-24 LAB — GLUCOSE, CAPILLARY
Glucose-Capillary: 148 mg/dL — ABNORMAL HIGH (ref 70–99)
Glucose-Capillary: 274 mg/dL — ABNORMAL HIGH (ref 70–99)
Glucose-Capillary: 301 mg/dL — ABNORMAL HIGH (ref 70–99)
Glucose-Capillary: 290 mg/dL — ABNORMAL HIGH (ref 70–99)

## 2023-12-24 LAB — BASIC METABOLIC PANEL WITH GFR
Anion gap: 12 (ref 5–15)
BUN: 36 mg/dL — ABNORMAL HIGH (ref 8–23)
CO2: 21 mmol/L — ABNORMAL LOW (ref 22–32)
Calcium: 9 mg/dL (ref 8.9–10.3)
Chloride: 103 mmol/L (ref 98–111)
Creatinine, Ser: 1.08 mg/dL — ABNORMAL HIGH (ref 0.44–1.00)
GFR, Estimated: 50 mL/min — ABNORMAL LOW
Glucose, Bld: 194 mg/dL — ABNORMAL HIGH (ref 70–99)
Potassium: 4.3 mmol/L (ref 3.5–5.1)
Sodium: 136 mmol/L (ref 135–145)

## 2023-12-24 LAB — CBC
HCT: 32.4 % — ABNORMAL LOW (ref 36.0–46.0)
Hemoglobin: 10.2 g/dL — ABNORMAL LOW (ref 12.0–15.0)
MCH: 35.5 pg — ABNORMAL HIGH (ref 26.0–34.0)
MCHC: 31.5 g/dL (ref 30.0–36.0)
MCV: 112.9 fL — ABNORMAL HIGH (ref 80.0–100.0)
Platelets: 183 K/uL (ref 150–400)
RBC: 2.87 MIL/uL — ABNORMAL LOW (ref 3.87–5.11)
RDW: 12.7 % (ref 11.5–15.5)
WBC: 6.3 K/uL (ref 4.0–10.5)
nRBC: 0 % (ref 0.0–0.2)

## 2023-12-24 LAB — MAGNESIUM: Magnesium: 1.8 mg/dL (ref 1.7–2.4)

## 2023-12-24 MED ORDER — FUROSEMIDE 20 MG PO TABS
20.0000 mg | ORAL_TABLET | ORAL | Status: DC
  Administered 2023-12-25: 20 mg via ORAL
  Filled 2023-12-24: qty 1

## 2023-12-24 NOTE — TOC CM/SW Note (Signed)
 Transition of Care [facility]) - Inpatient Brief Assessment   Patient Details  Name: Jacqueline Arroyo MRN: [id number] Date of Birth: [birth year]  Transition of [facility] CM/SW Contact:    [name], RN Phone Number: 12/24/2023, 10:11 AM   Clinical Narrative: Per MD, patiient refused SNF. Amenable to [facility] services. Will start search.   Transition of Care Asessment: Insurance and Status: Insurance coverage has been reviewed Patient has primary care physician: Yes     Prior/Current Home Services: No current home services Social Drivers of Health Review: SDOH reviewed no interventions necessary Readmission risk has been reviewed: Yes Transition of care needs: transition of care needs identified, TOC will continue to follow

## 2023-12-24 NOTE — Progress Notes (Signed)
 PT Cancellation Note  Patient Details Name: Jacqueline Arroyo MRN: [id number] DOB: [birth year]   Cancelled Treatment:    Reason Eval/Treat Not Completed: Other (comment)  Pt offered and encouraged session.  Stated she has been up today and resting in bed.  Pt does have some new L wrist pain which pt and family feels is a stain from pulling on bedrails but stated they have asked for an x-ray out of precaution.  She continues with L ankle pain which she stated she wears a brace for at home and is chronic.  Brace is not here but family stated they can bring it in.  She declined activity at this time.  Stated she wishes to return home and does not want to go to rehab.  Family stated they have all equipment at home and are able to assist short term but will need her independent again as they are unable to provide assist long term  sister has a few days off work .  Will leave discharge recommendations at SNF as she is not at baseline and would benefit but it is likely she will return home with family.     Lauraine Gills 12/24/2023, 1:38 PM

## 2023-12-24 NOTE — Inpatient Diabetes Management (Signed)
 Inpatient Diabetes Program Recommendations  AACE/ADA: New Consensus Statement on Inpatient Glycemic Control (2015)  Target Ranges:  Prepandial:   less than 140 mg/dL      Peak postprandial:   less than 180 mg/dL (1-2 hours)      Critically ill patients:  140 - 180 mg/dL    Latest Reference Range & Units 12/23/23 08:14 12/23/23 11:28 12/23/23 16:53 12/23/23 21:15  Glucose-Capillary 70 - 99 mg/dL 800 (H)  5 units Novolog   246 (H)  4 units Novolog   308 (H)  12 units Novolog   274 (H)  3 units Novolog    (H): Data is abnormally high  Latest Reference Range & Units 12/24/23 08:02 12/24/23 11:46  Glucose-Capillary 70 - 99 mg/dL 851 (H)  6 units Novolog   274 (H)  (H): Data is abnormally high   Home DM Meds Janumet 50/500 2 tablets daily     Current Orders: Novolog  0-9 units TID ac/hs   Novolog  5 units MC   Prednisone  20 mg daily       MD- While pt remains on Prednisone  and home oral DM meds are on hold, please consider Increasing Novolog  Meal Coverage to 8 units TID with meals   --Will follow patient during hospitalization--  Adina Rudolpho Arrow RN, MSN, CDCES Diabetes Coordinator Inpatient Glycemic Control Team Team Pager: [phone] (8a-5p)

## 2023-12-24 NOTE — Progress Notes (Signed)
 " Progress Note   Patient: Jacqueline Arroyo FMW:[id number] DOB: 05-04-1940 DOA: 12/21/2023     3 DOS: the patient was seen and examined on 12/24/2023   Brief hospital course: Jacqueline Arroyo is a pleasant 83 y.o. female with medical history significant for mild on surveillance, diabetes, HTN, HLD, asthma, polycythemia vera, HFrEF with last EF was around 35 to 40% from echo in 2025 July, CKD who presented to ED at [facility] for worsening shortness of breath cough and generalized weakness.  Patient had a recent hospitalization at [facility] and was discharged from the hospital on 12/16/2023 after being diagnosed with bronchitis and exacerbation of asthma.  She presented on 12/19  with worsening respiratory symptoms after she went home along with some chills, generalized weakness and worsening nausea.  Admitted for possible community-acquired pneumonia, asthma exacerbation, metapneumovirus infection.  Hospital course as below  Assessment and Plan:  Possible CAP  Asthma exacerbation  Metapnemovirus + Chronic hypoxic respiratory failure - 2L [place] Patient recently treated at OSH for asthma exacerbation and viral bronchitis (metapneumovirus +). She was discharged with albuterol  and steroid course Presented with worsening SOB, elevated D-dimer, VQ neg for PE. CXR with small opacity adjacent to elevated diaphragm.  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 Noted to be 12-13 during recent hospitalization. No obvious bleeding noted, denies active bleeding/melena Iron panel consistent with ACD Hold home hydroxyurea  PPI BID  HTN/HLD Hold home amlodipine , losartan given low normal blood pressures   Chronic  HFrEF Appears euvolemic. BNP mildly elevated though improved from recent hospitalization. On ARB, BB, and lasix  20mg  daily. Continue Metoprolol , resume lasix   - hold ARB  Left ankle osteoarthritis Has chronic pain, pain management  Left wrist pain Reports hurt her Left wrist while trying to get out of bed XR Left wrist pending Pain management  PT recommend SNF, patient declined would like to go home with home PT/OT    Subjective: Reports symptoms slightly better, improving. Remains tachycardic, will get EKG   Physical Exam: Vitals:   12/24/23 0757 12/24/23 1148 12/24/23 1604 12/24/23 1931  BP: 137/62 (!) 108/50 137/62 123/68  Pulse: 99 (!) 102 (!) 110 100  Resp: 16   20  Temp: 98.4 F (36.9 C) 97.7 F (36.5 C) 98.3 F (36.8 C) 98 F (36.7 C)  TempSrc: Oral Oral    SpO2: 94% 93% 97% 94%  Weight:       Physical Exam  Constitutional: In no distress.  Cardiovascular: systolic murmur. Normal rate, regular rhythm. No lower extremity edema  Pulmonary: Non labored breathing on [place], no wheezing Abdominal: Soft. Non distended and non tender Musculoskeletal: Normal range of motion.     Neurological: Alert and oriented to person, place, and time. Non focal  Skin: Skin is warm and dry.   Data Reviewed:     Latest Ref Rng & Units 12/24/2023    3:39 AM 12/23/2023    4:15 AM 12/22/2023    4:46 AM  BMP  Glucose 70 - 99 mg/dL 805  744  844   BUN 8 - 23 mg/dL 36  39  36   Creatinine 0.44 - 1.00 mg/dL 8.91  8.80  8.61   Sodium 135 -  145 mmol/L 136  133  133   Potassium 3.5 - 5.1 mmol/L 4.3  4.9  4.5   Chloride 98 - 111 mmol/L 103  103  101   CO2 22 - 32 mmol/L 21  20  20    Calcium  8.9 - 10.3 mg/dL 9.0  8.8  8.8       Latest Ref Rng & Units 12/24/2023    3:39 AM 12/23/2023    4:15 AM 12/22/2023    4:46 AM  CBC  WBC 4.0 - 10.5 K/uL 6.3  6.8  7.0   Hemoglobin 12.0 - 15.0 g/dL 89.7  89.7  88.9   Hematocrit 36.0 - 46.0 % 32.4  31.6  35.6   Platelets 150 - 400 K/uL 183   175  163      Family Communication: None at bedside   Disposition: Status is: Inpatient Remains inpatient appropriate because: treatment of dyspnea.   Planned Discharge Destination: Home    Time spent: 56 minutes  Author: Laree Lock, MD 12/24/2023 8:51 PM  For on call review [email or web address].  "

## 2023-12-24 NOTE — TOC Initial Note (Addendum)
 Transition of Care [facility]) - Initial/Assessment Note    Patient Details  Name: Jacqueline Arroyo MRN: [id number] Date of Birth: [birth year]  Transition [facility]) CM/SW Contact:    Jacqueline DELENA Daring, RN Phone Number: 12/24/2023, 11:52 AM  Clinical Narrative:                 RNCM introduced self to patient and explained role. Patient lives alone is a senior living apartment. Drives self to appointments. Will call son for ride at discharge. Uses [facility] in [place] for prescriptions. No difficulty affording meds. No current HH service. Has rollator  and 4 prong walking stick in home.  Refused SNF. Is amenable to [facility]eline [facility]. Will initiate search.  4:07 PM Patient accepted [facility] services with [facility]. Notified agency rep. Linked in hub. Will monitor for discharge readiness.  Expected Discharge Plan: Home w Home Health Services Barriers to Discharge: Continued Medical Work up   Patient Goals and CMS Choice Patient states their goals for this hospitalization and ongoing recovery are:: Go home          Expected Discharge Plan and Services In-house Referral: Clinical Social Work Discharge Planning Services: CM Consult   Living arrangements for the past 2 months: Apartment                                      Prior Living Arrangements/Services Living arrangements for the past 2 months: Apartment Lives with:: Self Patient language and need for interpreter reviewed:: Yes Do you feel safe going back to the place where you live?: Yes      Need for Family Participation in Patient Care: Yes (Comment) Care giver support system in place?: Yes (comment)      Activities of Daily Living   ADL Screening (condition at time of admission) Independently performs ADLs?: Yes (appropriate for developmental age) Is the patient deaf or have difficulty hearing?: No Does the patient have difficulty seeing, even when wearing glasses/contacts?: No Does the patient have difficulty concentrating, remembering, or  making decisions?: No  Permission Sought/Granted Permission sought to share information with : Case Manager Permission granted to share information with : Yes, Verbal Permission Granted              Emotional Assessment Appearance:: Appears stated age Attitude/Demeanor/Rapport: Gracious Affect (typically observed): Appropriate Orientation: : Oriented to Place, Oriented to  Time, Oriented to Self, Oriented to Situation Alcohol / Substance Use: Not Applicable Psych Involvement: No (comment)  Admission diagnosis:  Chills [R68.83] CAP (community acquired pneumonia) [J18.9] Diabetic ketoacidosis without coma associated with type 2 diabetes mellitus (HCC) [E11.10] Community acquired pneumonia, unspecified laterality [J18.9] Sepsis, due to unspecified organism, unspecified whether acute organ dysfunction present [facility]) [A41.9] Patient Active Problem List   Diagnosis Date Noted   CAP (community acquired pneumonia) 12/21/2023   HFrEF (heart failure with reduced ejection fraction) (HCC) 12/21/2023   HTN (hypertension) 12/21/2023   HLD (hyperlipidemia) 12/21/2023   DM (diabetes mellitus) (HCC) 12/21/2023   Asthma, chronic 12/21/2023   Polycythemia vera (HCC) 12/21/2023   Left ear pain 05/22/2023   Acute recurrent pansinusitis 05/22/2023   PCP:  [facility] Primary [facility]acy:   [facility] [address] [place] - [address] Jacqueline Arroyo [id number] Phone: [phone] Fax: [phone]     Social Drivers of Health (SDOH) Social History: SDOH Screenings   Food Insecurity: Patient Declined (12/22/2023)  Housing: Patient  Declined (12/22/2023)  Transportation Needs: Patient Declined (12/22/2023)  Utilities: Patient Declined (12/22/2023)  Financial Resource Strain: Low Risk (12/16/2023)   [facility]  Physical Activity: Insufficiently Active (06/20/2023)   Received from [facility]  Social Connections: Patient Declined (12/22/2023)   Stress: No Stress Concern Present (06/20/2023)   Received from [facility]  Tobacco Use: Low Risk (12/21/2023)  Health Literacy: Low Risk (06/20/2023)   Received from [facility] [place]   SDOH Interventions:     Readmission Risk Interventions     No data to display

## 2023-12-24 NOTE — Plan of Care (Signed)
" °  Problem: Fluid Volume: Goal: Hemodynamic stability will improve Outcome: Progressing   Problem: Clinical Measurements: Goal: Diagnostic test results will improve Outcome: Progressing Goal: Signs and symptoms of infection will decrease Outcome: Progressing   Problem: Respiratory: Goal: Ability to maintain adequate ventilation will improve Outcome: Progressing   Problem: Education: Goal: Ability to describe self-care measures that may prevent or decrease complications (Diabetes Survival Skills Education) will improve Outcome: Progressing Goal: Individualized Educational Video(s) Outcome: Progressing   Problem: Coping: Goal: Ability to adjust to condition or change in health will improve Outcome: Progressing   Problem: Fluid Volume: Goal: Ability to maintain a balanced intake and output will improve Outcome: Progressing   Problem: Health Behavior/Discharge Planning: Goal: Ability to identify and utilize available resources and services will improve Outcome: Progressing Goal: Ability to manage health-related needs will improve Outcome: Progressing   Problem: Metabolic: Goal: Ability to maintain appropriate glucose levels will improve Outcome: Progressing   Problem: Nutritional: Goal: Maintenance of adequate nutrition will improve Outcome: Progressing Goal: Progress toward achieving an optimal weight will improve Outcome: Progressing   Problem: Skin Integrity: Goal: Risk for impaired skin integrity will decrease Outcome: Progressing   Problem: Tissue Perfusion: Goal: Adequacy of tissue perfusion will improve Outcome: Progressing   Problem: Education: Goal: Knowledge of General Education information will improve Description: Including pain rating scale, medication(s)/side effects and non-pharmacologic comfort measures Outcome: Progressing   Problem: Health Behavior/Discharge Planning: Goal: Ability to manage health-related needs will improve Outcome:  Progressing   Problem: Clinical Measurements: Goal: Ability to maintain clinical measurements within normal limits will improve Outcome: Progressing Goal: Will remain free from infection Outcome: Progressing Goal: Diagnostic test results will improve Outcome: Progressing Goal: Respiratory complications will improve Outcome: Progressing Goal: Cardiovascular complication will be avoided Outcome: Progressing   Problem: Activity: Goal: Risk for activity intolerance will decrease Outcome: Progressing   Problem: Nutrition: Goal: Adequate nutrition will be maintained Outcome: Progressing   Problem: Coping: Goal: Level of anxiety will decrease Outcome: Progressing   Problem: Elimination: Goal: Will not experience complications related to bowel motility Outcome: Progressing Goal: Will not experience complications related to urinary retention Outcome: Progressing   Problem: Pain Managment: Goal: General experience of comfort will improve and/or be controlled Outcome: Progressing   Problem: Safety: Goal: Ability to remain free from injury will improve Outcome: Progressing   Problem: Skin Integrity: Goal: Risk for impaired skin integrity will decrease Outcome: Progressing   Problem: Activity: Goal: Ability to tolerate increased activity will improve Outcome: Progressing   Problem: Clinical Measurements: Goal: Ability to maintain a body temperature in the normal range will improve Outcome: Progressing   Problem: Respiratory: Goal: Ability to maintain adequate ventilation will improve Outcome: Progressing Goal: Ability to maintain a clear airway will improve Outcome: Progressing   "

## 2023-12-25 ENCOUNTER — Inpatient Hospital Stay

## 2023-12-25 DIAGNOSIS — D45 Polycythemia vera: Secondary | ICD-10-CM | POA: Diagnosis not present

## 2023-12-25 DIAGNOSIS — J189 Pneumonia, unspecified organism: Secondary | ICD-10-CM

## 2023-12-25 LAB — BASIC METABOLIC PANEL WITH GFR
Anion gap: 13 (ref 5–15)
BUN: 27 mg/dL — ABNORMAL HIGH (ref 8–23)
CO2: 20 mmol/L — ABNORMAL LOW (ref 22–32)
Calcium: 8.9 mg/dL (ref 8.9–10.3)
Chloride: 100 mmol/L (ref 98–111)
Creatinine, Ser: 0.96 mg/dL (ref 0.44–1.00)
GFR, Estimated: 58 mL/min — ABNORMAL LOW
Glucose, Bld: 188 mg/dL — ABNORMAL HIGH (ref 70–99)
Potassium: 4.3 mmol/L (ref 3.5–5.1)
Sodium: 133 mmol/L — ABNORMAL LOW (ref 135–145)

## 2023-12-25 LAB — GLUCOSE, CAPILLARY
Glucose-Capillary: 249 mg/dL — ABNORMAL HIGH (ref 70–99)
Glucose-Capillary: 340 mg/dL — ABNORMAL HIGH (ref 70–99)
Glucose-Capillary: 432 mg/dL — ABNORMAL HIGH (ref 70–99)
Glucose-Capillary: 280 mg/dL — ABNORMAL HIGH (ref 70–99)

## 2023-12-25 LAB — TSH: TSH: 2.94 u[IU]/mL (ref 0.350–4.500)

## 2023-12-25 LAB — PRO BRAIN NATRIURETIC PEPTIDE: Pro Brain Natriuretic Peptide: 6107 pg/mL — ABNORMAL HIGH

## 2023-12-25 MED ORDER — TRAMADOL HCL 50 MG PO TABS
25.0000 mg | ORAL_TABLET | Freq: Four times a day (QID) | ORAL | Status: DC | PRN
  Administered 2023-12-26 – 2024-01-06 (×30): 50 mg via ORAL
  Filled 2023-12-25 (×14): qty 1

## 2023-12-25 MED ORDER — FUROSEMIDE 10 MG/ML IJ SOLN
40.0000 mg | Freq: Two times a day (BID) | INTRAMUSCULAR | Status: DC
  Administered 2023-12-25: 40 mg via INTRAVENOUS
  Filled 2023-12-25: qty 4

## 2023-12-25 MED ORDER — TROLAMINE SALICYLATE 10 % EX CREA: TOPICAL_CREAM | Freq: Two times a day (BID) | CUTANEOUS | Status: DC | PRN

## 2023-12-25 MED ORDER — DICLOFENAC SODIUM 1 % EX GEL
2.0000 g | Freq: Two times a day (BID) | CUTANEOUS | Status: DC | PRN
  Administered 2024-01-07 – 2024-01-09 (×2): 2 g via TOPICAL
  Filled 2023-12-25: qty 100

## 2023-12-25 NOTE — Progress Notes (Signed)
 Physical Therapy Treatment Patient Details Name: Jacqueline Arroyo MRN: [id number] DOB: 05-20-1940 Today's Date: 12/25/2023   History of Present Illness Pt is a pleasant 82 y.o. female with medical history significant for diabetes, HTN, HLD, asthma, polycythemia vera, HFrEF with last EF was around 35 to 40% from echo in 2025 July, CKD who presented to ED at [facility] for worsening shortness of breath cough and generalized weakness. MD assessment includes:  Possible CAP, asthma exacerbation, metapnemovirus, AKI.    PT Comments  Patient seen for PT session focused on bed side transfers with attempts for ambulation. Pt ultimately unable to progress to ambulation due to limited ability to take functional steps. Patient required modA to stand transfer to [facility] with additional incontinence adding complexity to the transfer. Tolerated session poorly due to incontinence and lower extremity weakness. Patient shows fair potential to make progress with continued acute level rehab due to limited safety and functional awareness of deficits. Continued skilled PT recommended to progress toward functional goals and support discharge readiness. Pt making good progress toward goals, will continue to follow POC. Discharge recommendation remains appropriate to SNF.     If plan is discharge home, recommend the following: A lot of help with walking and/or transfers;A little help with bathing/dressing/bathroom;Assistance with cooking/housework;Assist for transportation   Can travel by private vehicle     No  Equipment Recommendations  None recommended by PT    Recommendations for Other Services       Precautions / Restrictions Precautions Precautions: Fall Restrictions Weight Bearing Restrictions Per Provider Order: No Other Position/Activity Restrictions: Chronic L ankle pain     Mobility  Bed Mobility Overal bed mobility: Needs Assistance Bed Mobility: Sidelying to Sit   Sidelying to sit: Mod assist        General bed mobility comments: step by step verbal cues with physical assistance to lower extremity    Transfers Overall transfer level: Needs assistance Equipment used: Rolling walker (2 wheels) Transfers: Sit to/from Stand, Bed to chair/wheelchair/BSC Sit to Stand: Contact guard assist Stand pivot transfers: Min assist, Mod assist         General transfer comment: Min verbal cues for hand placement with min extra time and effort to come to standing; Pt very weak with standing and ultimately not able to progress to taking steps in room with RW; step pivot to [facility] attempted however pt. unable to take functional steps; pt sit to stand x4 with no functional improvement within session    Ambulation/Gait                   Stairs             Wheelchair Mobility     Tilt Bed    Modified Rankin (Stroke Patients Only)       Balance Overall balance assessment: Needs assistance   Sitting balance-Leahy Scale: Normal     Standing balance support: Bilateral upper extremity supported, During functional activity Standing balance-Leahy Scale: Good                              Communication Communication Communication: No apparent difficulties  Cognition Arousal: Alert Behavior During Therapy: WFL for tasks assessed/performed   PT - Cognitive impairments: No apparent impairments                         Following commands: Intact      Cueing Cueing Techniques:  Verbal cues, Visual cues  Exercises      General Comments        Pertinent Vitals/Pain Pain Assessment Pain Score: 7  Pain Location: L ankle and L wrist Pain Descriptors / Indicators: Aching, Sore Pain Intervention(s): Repositioned, Monitored during session, Limited activity within patient's tolerance    Home Living                          Prior Function            PT Goals (current goals can now be found in the care plan section) Acute Rehab PT  Goals Patient Stated Goal: To feel better and return home PT Goal Formulation: With patient Time For Goal Achievement: 01/05/24 Potential to Achieve Goals: Good Progress towards PT goals: Progressing toward goals    Frequency    Min 2X/week      PT Plan      Co-evaluation              AM-PAC PT 6 Clicks Mobility   Outcome Measure  Help needed turning from your back to your side while in a flat bed without using bedrails?: A Little Help needed moving from lying on your back to sitting on the side of a flat bed without using bedrails?: A Little Help needed moving to and from a bed to a chair (including a wheelchair)?: A Little Help needed standing up from a chair using your arms (e.g., wheelchair or bedside chair)?: A Little Help needed to walk in hospital room?: A Lot Help needed climbing 3-5 steps with a railing? : Total 6 Click Score: 15    End of Session Equipment Utilized During Treatment: Gait belt Activity Tolerance: Patient limited by pain Patient left: in chair;with call bell/phone within reach;with chair alarm set Nurse Communication: Mobility status;Other (comment) PT Visit Diagnosis: Difficulty in walking, not elsewhere classified (R26.2);Muscle weakness (generalized) (M62.81);Pain Pain - Right/Left: Left Pain - part of body: Ankle and joints of foot     Time: 1451-1525 PT Time Calculation (min) (ACUTE ONLY): 34 min  Charges:    $Therapeutic Activity: 23-37 mins PT General Charges $$ ACUTE PT VISIT: 1 Visit                     Jacqueline Lesches DPT, PT     Jacqueline Arroyo 12/25/2023, 3:35 PM

## 2023-12-25 NOTE — Plan of Care (Signed)
" °  Problem: Fluid Volume: Goal: Hemodynamic stability will improve Outcome: Progressing   Problem: Clinical Measurements: Goal: Diagnostic test results will improve Outcome: Progressing Goal: Signs and symptoms of infection will decrease Outcome: Progressing   Problem: Respiratory: Goal: Ability to maintain adequate ventilation will improve Outcome: Progressing   Problem: Education: Goal: Ability to describe self-care measures that may prevent or decrease complications (Diabetes Survival Skills Education) will improve Outcome: Progressing Goal: Individualized Educational Video(s) Outcome: Progressing   Problem: Coping: Goal: Ability to adjust to condition or change in health will improve Outcome: Progressing   Problem: Fluid Volume: Goal: Ability to maintain a balanced intake and output will improve Outcome: Progressing   Problem: Health Behavior/Discharge Planning: Goal: Ability to identify and utilize available resources and services will improve Outcome: Progressing Goal: Ability to manage health-related needs will improve Outcome: Progressing   Problem: Metabolic: Goal: Ability to maintain appropriate glucose levels will improve Outcome: Progressing   Problem: Nutritional: Goal: Maintenance of adequate nutrition will improve Outcome: Progressing Goal: Progress toward achieving an optimal weight will improve Outcome: Progressing   Problem: Skin Integrity: Goal: Risk for impaired skin integrity will decrease Outcome: Progressing   Problem: Tissue Perfusion: Goal: Adequacy of tissue perfusion will improve Outcome: Progressing   Problem: Education: Goal: Knowledge of General Education information will improve Description: Including pain rating scale, medication(s)/side effects and non-pharmacologic comfort measures Outcome: Progressing   Problem: Health Behavior/Discharge Planning: Goal: Ability to manage health-related needs will improve Outcome:  Progressing   Problem: Clinical Measurements: Goal: Ability to maintain clinical measurements within normal limits will improve Outcome: Progressing Goal: Will remain free from infection Outcome: Progressing Goal: Diagnostic test results will improve Outcome: Progressing Goal: Respiratory complications will improve Outcome: Progressing Goal: Cardiovascular complication will be avoided Outcome: Progressing   Problem: Activity: Goal: Risk for activity intolerance will decrease Outcome: Progressing   Problem: Nutrition: Goal: Adequate nutrition will be maintained Outcome: Progressing   Problem: Coping: Goal: Level of anxiety will decrease Outcome: Progressing   Problem: Elimination: Goal: Will not experience complications related to bowel motility Outcome: Progressing Goal: Will not experience complications related to urinary retention Outcome: Progressing   Problem: Pain Managment: Goal: General experience of comfort will improve and/or be controlled Outcome: Progressing   Problem: Safety: Goal: Ability to remain free from injury will improve Outcome: Progressing   Problem: Skin Integrity: Goal: Risk for impaired skin integrity will decrease Outcome: Progressing   Problem: Activity: Goal: Ability to tolerate increased activity will improve Outcome: Progressing   Problem: Clinical Measurements: Goal: Ability to maintain a body temperature in the normal range will improve Outcome: Progressing   Problem: Respiratory: Goal: Ability to maintain adequate ventilation will improve Outcome: Progressing Goal: Ability to maintain a clear airway will improve Outcome: Progressing   "

## 2023-12-25 NOTE — Plan of Care (Signed)
 Vitals signs remained relatively stable. HR has been maintaining in the low 100s. BG elevated this shift. Insulin  given. MD aware. No changes to insulin . BNP elevated. CXRAY done. Patient not really wanting to get up. Purewick placed. Pain in joint too bad to get up too often. IV lasix  started Problem: Fluid Volume: Goal: Hemodynamic stability will improve Outcome: Progressing   Problem: Clinical Measurements: Goal: Diagnostic test results will improve Outcome: Progressing Goal: Signs and symptoms of infection will decrease Outcome: Progressing   Problem: Respiratory: Goal: Ability to maintain adequate ventilation will improve Outcome: Progressing   Problem: Education: Goal: Ability to describe self-care measures that may prevent or decrease complications (Diabetes Survival Skills Education) will improve Outcome: Progressing Goal: Individualized Educational Video(s) Outcome: Progressing   Problem: Coping: Goal: Ability to adjust to condition or change in health will improve Outcome: Progressing   Problem: Fluid Volume: Goal: Ability to maintain a balanced intake and output will improve Outcome: Progressing   Problem: Health Behavior/Discharge Planning: Goal: Ability to identify and utilize available resources and services will improve Outcome: Progressing Goal: Ability to manage health-related needs will improve Outcome: Progressing   Problem: Metabolic: Goal: Ability to maintain appropriate glucose levels will improve Outcome: Progressing   Problem: Nutritional: Goal: Maintenance of adequate nutrition will improve Outcome: Progressing Goal: Progress toward achieving an optimal weight will improve Outcome: Progressing   Problem: Skin Integrity: Goal: Risk for impaired skin integrity will decrease Outcome: Progressing   Problem: Tissue Perfusion: Goal: Adequacy of tissue perfusion will improve Outcome: Progressing   Problem: Education: Goal: Knowledge of General  Education information will improve Description: Including pain rating scale, medication(s)/side effects and non-pharmacologic comfort measures Outcome: Progressing   Problem: Health Behavior/Discharge Planning: Goal: Ability to manage health-related needs will improve Outcome: Progressing   Problem: Clinical Measurements: Goal: Ability to maintain clinical measurements within normal limits will improve Outcome: Progressing Goal: Will remain free from infection Outcome: Progressing Goal: Diagnostic test results will improve Outcome: Progressing Goal: Respiratory complications will improve Outcome: Progressing Goal: Cardiovascular complication will be avoided Outcome: Progressing   Problem: Activity: Goal: Risk for activity intolerance will decrease Outcome: Progressing   Problem: Nutrition: Goal: Adequate nutrition will be maintained Outcome: Progressing   Problem: Coping: Goal: Level of anxiety will decrease Outcome: Progressing   Problem: Elimination: Goal: Will not experience complications related to bowel motility Outcome: Progressing Goal: Will not experience complications related to urinary retention Outcome: Progressing   Problem: Pain Managment: Goal: General experience of comfort will improve and/or be controlled Outcome: Progressing   Problem: Safety: Goal: Ability to remain free from injury will improve Outcome: Progressing   Problem: Skin Integrity: Goal: Risk for impaired skin integrity will decrease Outcome: Progressing   Problem: Activity: Goal: Ability to tolerate increased activity will improve Outcome: Progressing   Problem: Clinical Measurements: Goal: Ability to maintain a body temperature in the normal range will improve Outcome: Progressing   Problem: Respiratory: Goal: Ability to maintain adequate ventilation will improve Outcome: Progressing Goal: Ability to maintain a clear airway will improve Outcome: Progressing

## 2023-12-25 NOTE — Progress Notes (Signed)
 " Progress Note   Patient: Jacqueline Arroyo DOB: 04/06/1940 DOA: 12/21/2023     4 DOS: the patient was seen and examined on 12/25/2023   Brief hospital course: Jacqueline Arroyo is a pleasant 83 y.o. female with medical history significant for mild on surveillance, diabetes, HTN, HLD, asthma, polycythemia vera, HFrEF with last EF was around 35 to 40% from echo in 2025 July, CKD who presented to ED at [facility] for worsening shortness of breath cough and generalized weakness.  Patient had a recent hospitalization at [facility] and was discharged from the hospital on 12/16/2023 after being diagnosed with bronchitis and exacerbation of asthma.  She presented on 12/19  with worsening respiratory symptoms after she went home along with some chills, generalized weakness and worsening nausea.  Admitted for possible community-acquired pneumonia, asthma exacerbation, metapneumovirus infection.  Hospital course as below  Assessment and Plan:  Possible CAP  Asthma exacerbation  Metapnemovirus + Chronic hypoxic respiratory failure - 2L [place] Patient recently treated at OSH for asthma exacerbation and viral bronchitis (metapneumovirus +). She was discharged with albuterol  and steroid course Presented with worsening SOB, elevated D-dimer, VQ neg for PE. CXR with small opacity adjacent to elevated diaphragm.  Completed abx course, prednisone  x 5 days Home inhalers, DuoNebs as needed  Acute on chronic HFrEF Patient tachycardic, mild tachypnea.  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 Completed prednisone  today  H/o Polycythemia vera  Hgb stable. Noted to be 12-13 during recent hospitalization. No obvious bleeding noted, denies active bleeding/melena Iron panel consistent  with ACD Hold home hydroxyurea  PPI BID  HTN/HLD Hold home amlodipine , losartan given low normal blood pressures   Left ankle osteoarthritis Has chronic pain Change oxycodone  to Tramadol , Topical Aspercreme  Left wrist pain Reports hurt her Left wrist while trying to get out of bed XR Left wrist no acute fracture Pain management as above  PT recommend SNF, patient declined would like to go home with home PT/OT    Subjective: Reports symptoms slightly better, improving. Remains tachycardic, BNP with fluids overload, started IV lasix    Physical Exam: Vitals:   12/25/23 0518 12/25/23 0812 12/25/23 0813 12/25/23 1318  BP: (!) 141/73 (!) 158/78  (!) 158/75  Pulse: (!) 104 (!) 110  (!) 113  Resp: 20 (!) 22 19 20   Temp: (!) 97.5 F (36.4 C) 97.7 F (36.5 C)  98.4 F (36.9 C)  TempSrc:  Oral    SpO2: 90% 92%  91%  Weight:       Physical Exam  Constitutional: In no distress.  Cardiovascular: systolic murmur. Normal rate, regular rhythm. No lower extremity edema  Pulmonary: Non labored breathing on [place], no wheezing, bibasilar crackles +  Abdominal: Soft. Non distended and non tender Musculoskeletal: Normal range of motion.     Neurological: Alert and oriented to person, place, and time. Non focal  Skin: Skin is warm and dry.   Data Reviewed:     Latest Ref Rng & Units 12/25/2023    3:29 AM 12/24/2023    3:39 AM 12/23/2023    4:15 AM  BMP  Glucose 70 - 99 mg/dL 811  805  744   BUN 8 - 23 mg/dL 27  36  39   Creatinine 0.44 -  1.00 mg/dL 9.03  8.91  8.80   Sodium 135 - 145 mmol/L 133  136  133   Potassium 3.5 - 5.1 mmol/L 4.3  4.3  4.9   Chloride 98 - 111 mmol/L 100  103  103   CO2 22 - 32 mmol/L 20  21  20    Calcium  8.9 - 10.3 mg/dL 8.9  9.0  8.8       Latest Ref Rng & Units 12/24/2023    3:39 AM 12/23/2023    4:15 AM 12/22/2023    4:46 AM  CBC  WBC 4.0 - 10.5 K/uL 6.3  6.8  7.0   Hemoglobin 12.0 - 15.0 g/dL 89.7  89.7  88.9   Hematocrit 36.0 - 46.0 % 32.4   31.6  35.6   Platelets 150 - 400 K/uL 183  175  163      Family Communication: None at bedside   Disposition: Status is: Inpatient Remains inpatient appropriate because: treatment of dyspnea.   Planned Discharge Destination: Home    Time spent: 56 minutes  Author: Laree Lock, MD 12/25/2023 3:09 PM  For on call review [email or web address].  "

## 2023-12-26 ENCOUNTER — Inpatient Hospital Stay

## 2023-12-26 DIAGNOSIS — J189 Pneumonia, unspecified organism: Secondary | ICD-10-CM | POA: Diagnosis not present

## 2023-12-26 DIAGNOSIS — D45 Polycythemia vera: Secondary | ICD-10-CM | POA: Diagnosis not present

## 2023-12-26 LAB — CBC
HCT: 34.6 % — ABNORMAL LOW (ref 36.0–46.0)
Hemoglobin: 11.4 g/dL — ABNORMAL LOW (ref 12.0–15.0)
MCH: 35.5 pg — ABNORMAL HIGH (ref 26.0–34.0)
MCHC: 32.9 g/dL (ref 30.0–36.0)
MCV: 107.8 fL — ABNORMAL HIGH (ref 80.0–100.0)
Platelets: 229 K/uL (ref 150–400)
RBC: 3.21 MIL/uL — ABNORMAL LOW (ref 3.87–5.11)
RDW: 12.4 % (ref 11.5–15.5)
WBC: 8.8 K/uL (ref 4.0–10.5)
nRBC: 0 % (ref 0.0–0.2)

## 2023-12-26 LAB — BASIC METABOLIC PANEL WITH GFR
Anion gap: 15 (ref 5–15)
BUN: 34 mg/dL — ABNORMAL HIGH (ref 8–23)
CO2: 21 mmol/L — ABNORMAL LOW (ref 22–32)
Calcium: 8.9 mg/dL (ref 8.9–10.3)
Chloride: 95 mmol/L — ABNORMAL LOW (ref 98–111)
Creatinine, Ser: 1.16 mg/dL — ABNORMAL HIGH (ref 0.44–1.00)
GFR, Estimated: 46 mL/min — ABNORMAL LOW
Glucose, Bld: 237 mg/dL — ABNORMAL HIGH (ref 70–99)
Potassium: 4 mmol/L (ref 3.5–5.1)
Sodium: 131 mmol/L — ABNORMAL LOW (ref 135–145)

## 2023-12-26 LAB — GLUCOSE, CAPILLARY
Glucose-Capillary: 232 mg/dL — ABNORMAL HIGH (ref 70–99)
Glucose-Capillary: 289 mg/dL — ABNORMAL HIGH (ref 70–99)
Glucose-Capillary: 208 mg/dL — ABNORMAL HIGH (ref 70–99)
Glucose-Capillary: 219 mg/dL — ABNORMAL HIGH (ref 70–99)

## 2023-12-26 LAB — CULTURE, BLOOD (ROUTINE X 2)
Culture: NO GROWTH
Culture: NO GROWTH
Special Requests: ADEQUATE
Special Requests: ADEQUATE

## 2023-12-26 LAB — MAGNESIUM: Magnesium: 1.9 mg/dL (ref 1.7–2.4)

## 2023-12-26 MED ORDER — METOPROLOL SUCCINATE ER 50 MG PO TB24
75.0000 mg | ORAL_TABLET | Freq: Every day | ORAL | Status: DC
  Administered 2023-12-26: 75 mg via ORAL
  Filled 2023-12-26: qty 1

## 2023-12-26 MED ORDER — LACTATED RINGERS IV BOLUS
500.0000 mL | Freq: Once | INTRAVENOUS | Status: AC
  Administered 2023-12-26: 500 mL via INTRAVENOUS

## 2023-12-26 MED ORDER — METOPROLOL TARTRATE 25 MG PO TABS
12.5000 mg | ORAL_TABLET | Freq: Once | ORAL | Status: AC
  Administered 2023-12-26: 12.5 mg via ORAL
  Filled 2023-12-26: qty 1

## 2023-12-26 NOTE — Progress Notes (Signed)
 " Progress Note   Patient: Jacqueline Arroyo FMW:[id number] DOB: [birth year] DOA: 12/21/2023     5 DOS: the patient was seen and examined on 12/26/2023   Brief hospital course: Jacqueline Arroyo is a pleasant 83 y.o. female with medical history significant for mild on surveillance, diabetes, HTN, HLD, asthma, polycythemia vera, HFrEF with last EF was around 35 to 40% from echo in 2025 July, CKD who presented to ED at [facility] for worsening shortness of breath cough and generalized weakness.  Patient had a recent hospitalization at [facility] and was discharged from the hospital on 12/16/2023 after being diagnosed with bronchitis and exacerbation of asthma.  She presented on 12/19  with worsening respiratory symptoms after she went home along with some chills, generalized weakness and worsening nausea.  Admitted for possible community-acquired pneumonia, asthma exacerbation, metapneumovirus infection.  Hospital course as below  Assessment and Plan:  Possible CAP  Asthma exacerbation  Metapnemovirus + Chronic hypoxic respiratory failure - 2L [place] Patient recently treated at OSH for asthma exacerbation and viral bronchitis (metapneumovirus +). She was discharged with albuterol  and steroid course Presented with worsening SOB, elevated D-dimer, VQ neg for PE. CXR with small opacity adjacent to elevated diaphragm.  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 Completed prednisone   H/o Polycythemia vera  Hgb stable. Noted to be 12-13 during recent hospitalization. No obvious bleeding noted, denies active bleeding/melena Iron panel consistent with ACD Hold home hydroxyurea  PPI BID  Tachycardia with PVCs EKG  with sinus tachycardia Increase metoprolol  XL to 75 mg daily  HTN/HLD Hold home amlodipine , losartan given low normal blood pressures   Left ankle osteoarthritis Has chronic pain Daughter reports patient had injury 3 days ago - XR Left ankle pending Change oxycodone  to Tramadol , Topical diclofenac  gel  Left wrist pain Reports hurt her Left wrist while trying to get out of bed XR Left wrist no acute fracture Pain management as above   PT recommended SNF, initially refused but now would like to go to SNF [facility] notified    Subjective: Reports left wrist pain and left ankle pain slightly better. Daughter later reported to RN that patient had injury to left ankle, will get x-ray Increase dose of metoprolol  for PVC's  Patient initially refused SNF, but now would like to get discharged to rehab -[facility] aware   Physical Exam: Vitals:   12/26/23 0344 12/26/23 0500 12/26/23 0745 12/26/23 1220  BP: 117/65  110/65 120/64  Pulse: 97  97   Resp: 20  20   Temp: 98.2 F (36.8 C)  98.1 F (36.7 C) 98.1 F (36.7 C)  TempSrc:   Oral Oral  SpO2: 90%  91% 90%  Weight:  70.2 kg     Physical Exam  Constitutional: In no distress.  Cardiovascular: systolic murmur. Normal rate, regular rhythm. No lower extremity edema  Pulmonary: Non labored breathing on [place], no wheezing Abdominal: Soft. Non distended and non tender Musculoskeletal: Normal range of motion.     Neurological: Alert and oriented to person, place, and time. Non focal  Skin: Skin is warm and dry.   Data Reviewed:     Latest Ref Rng & Units  12/26/2023    3:46 AM 12/25/2023    3:29 AM 12/24/2023    3:39 AM  BMP  Glucose 70 - 99 mg/dL 762  811  805   BUN 8 - 23 mg/dL 34  27  36   Creatinine 0.44 - 1.00 mg/dL 8.83  9.03  8.91   Sodium 135 - 145 mmol/L 131  133  136   Potassium 3.5 - 5.1 mmol/L 4.0  4.3  4.3   Chloride 98 - 111 mmol/L 95  100  103   CO2 22 - 32 mmol/L 21  20  21    Calcium  8.9 - 10.3 mg/dL 8.9   8.9  9.0       Latest Ref Rng & Units 12/26/2023    3:46 AM 12/24/2023    3:39 AM 12/23/2023    4:15 AM  CBC  WBC 4.0 - 10.5 K/uL 8.8  6.3  6.8   Hemoglobin 12.0 - 15.0 g/dL 88.5  89.7  89.7   Hematocrit 36.0 - 46.0 % 34.6  32.4  31.6   Platelets 150 - 400 K/uL 229  183  175      Family Communication: None at bedside   Disposition: Status is: Inpatient Remains inpatient appropriate because: treatment of dyspnea.   Planned Discharge Destination: Home    Time spent: 56 minutes  Author: Laree Lock, MD 12/26/2023 2:39 PM  For on call review [email or web address].  "

## 2023-12-26 NOTE — Progress Notes (Signed)
 Daughter at bedside. Called nurse into room. Said she was concerned about pts ankles and legs, and how there hurting her. I explained this is the first time I was hearing about it. The only pain the pts states she is in, is her left wrist. Updated the doctor and doctor will order xray

## 2023-12-26 NOTE — [place] FL2 (Addendum)
 " [place]  MEDICAID FL2 LEVEL OF CARE FORM     IDENTIFICATION  Patient Name: Jacqueline Arroyo Birthdate: [birth year] Sex: female Admission Date (Current Location): 12/21/2023  [facility] and [place] Number:  Chiropodist and Address:   [address] [place] [address]      Provider Number: [id number]  Attending Physician Name and Address:  Jerelene Critchley, MD  Relative Name and Phone Number:     Ismelda Weatherman [phone]  Current Level of Care: Hospital Recommended Level of Care: Skilled Nursing Facility Prior Approval Number:    Date Approved/Denied:   PASRR Number:  [id number]  Discharge Plan: SNF    Current Diagnoses: Patient Active Problem List   Diagnosis Date Noted   CAP (community acquired pneumonia) 12/21/2023   HFrEF (heart failure with reduced ejection fraction) (HCC) 12/21/2023   HTN (hypertension) 12/21/2023   HLD (hyperlipidemia) 12/21/2023   DM (diabetes mellitus) (HCC) 12/21/2023   Asthma, chronic 12/21/2023   Polycythemia vera (HCC) 12/21/2023   Left ear pain 05/22/2023   Acute recurrent pansinusitis 05/22/2023    Orientation RESPIRATION BLADDER Height & Weight     Self, Time, Situation, Place  O2 Incontinent Weight: 70.2 kg Height:     BEHAVIORAL SYMPTOMS/MOOD NEUROLOGICAL BOWEL NUTRITION STATUS      Incontinent Diet  AMBULATORY STATUS COMMUNICATION OF NEEDS Skin   Limited Assist Verbally Normal                       Personal Care Assistance Level of Assistance  Dressing, Bathing Bathing Assistance: Limited assistance   Dressing Assistance: Limited assistance     Functional Limitations Info             SPECIAL CARE FACTORS FREQUENCY  PT (By licensed PT), OT (By licensed OT)     PT Frequency: 3X/WEEK OT Frequency: 3X/WEEK            Contractures Contractures Info: Not present    Additional Factors Info  Code Status Code Status Info: DNR Limited             Current Medications (12/26/2023):   This is the current hospital active medication list Current Facility-Administered Medications  Medication Dose Route Frequency Provider Last Rate Last Admin   acetaminophen  (TYLENOL ) tablet 650 mg  650 mg Oral Q6H PRN Paudel, Keshab, MD   650 mg at 12/21/23 9187   Or   acetaminophen  (TYLENOL ) suppository 650 mg  650 mg Rectal Q6H PRN Roann Gouty, MD       aspirin  EC tablet 81 mg  81 mg Oral Daily Paudel, Gouty, MD   81 mg at 12/26/23 9187   atorvastatin  (LIPITOR) tablet 10 mg  10 mg Oral Daily Paudel, Gouty, MD   10 mg at 12/26/23 9188   cycloSPORINE  (RESTASIS ) 0.05 % ophthalmic emulsion 1 drop  1 drop Both Eyes BID Ponnala, Shruthi, MD   1 drop at 12/26/23 9187   diclofenac  Sodium (VOLTAREN ) 1 % topical gel 2 g  2 g Topical BID PRN Ponnala, Shruthi, MD       fluticasone  furoate-vilanterol (BREO ELLIPTA ) 100-25 MCG/ACT 1 puff  1 puff Inhalation Daily Franchot Novel, MD   1 puff at 12/26/23 9187   guaiFENesin  (ROBITUSSIN) 100 MG/5ML liquid 5 mL  5 mL Oral Q4H PRN Paudel, Keshab, MD       insulin  aspart (novoLOG ) injection 0-5 Units  0-5 Units Subcutaneous QHS Paudel, Keshab, MD   3 Units at 12/25/23 2202  insulin  aspart (novoLOG ) injection 0-9 Units  0-9 Units Subcutaneous TID WC Paudel, Keshab, MD   5 Units at 12/26/23 1239   insulin  aspart (novoLOG ) injection 5 Units  5 Units Subcutaneous TID WC Ponnala, Shruthi, MD   5 Units at 12/26/23 1239   ipratropium-albuterol  (DUONEB) 0.5-2.5 (3) MG/3ML nebulizer solution 3 mL  3 mL Nebulization Q6H PRN Roann Gouty, MD   3 mL at 12/21/23 1353   lidocaine  (LIDODERM ) 5 % 1 patch  1 patch Transdermal Q24H Franchot Novel, MD   1 patch at 12/26/23 1238   LORazepam  (ATIVAN ) tablet 0.25 mg  0.25 mg Oral BID Franchot Novel, MD   0.25 mg at 12/26/23 9188   metoprolol  succinate (TOPROL -XL) 24 hr tablet 50 mg  50 mg Oral QHS Paudel, Keshab, MD   50 mg at 12/25/23 2131   ondansetron  (ZOFRAN ) tablet 4 mg  4 mg Oral Q6H PRN Paudel, Keshab, MD       Or    ondansetron  (ZOFRAN ) injection 4 mg  4 mg Intravenous Q6H PRN Paudel, Gouty, MD       pantoprazole  (PROTONIX ) EC tablet 40 mg  40 mg Oral BID AC Ponnala, Shruthi, MD   40 mg at 12/26/23 9188   traMADol  (ULTRAM ) tablet 25-50 mg  25-50 mg Oral Q6H PRN Jerelene Critchley, MD   50 mg at 12/26/23 1154     Discharge Medications: Please see discharge summary for a list of discharge medications.  Relevant Imaging Results:  Relevant Lab Results:   Additional Information Ssn [id number]   Victory Jackquline RAMAN, RN     "

## 2023-12-26 NOTE — Plan of Care (Signed)
" °  Problem: Fluid Volume: Goal: Hemodynamic stability will improve Outcome: Progressing   Problem: Clinical Measurements: Goal: Diagnostic test results will improve Outcome: Progressing Goal: Signs and symptoms of infection will decrease Outcome: Progressing   Problem: Respiratory: Goal: Ability to maintain adequate ventilation will improve Outcome: Progressing   Problem: Education: Goal: Ability to describe self-care measures that may prevent or decrease complications (Diabetes Survival Skills Education) will improve Outcome: Progressing Goal: Individualized Educational Video(s) Outcome: Progressing   Problem: Coping: Goal: Ability to adjust to condition or change in health will improve Outcome: Progressing   Problem: Fluid Volume: Goal: Ability to maintain a balanced intake and output will improve Outcome: Progressing   Problem: Health Behavior/Discharge Planning: Goal: Ability to identify and utilize available resources and services will improve Outcome: Progressing Goal: Ability to manage health-related needs will improve Outcome: Progressing   Problem: Metabolic: Goal: Ability to maintain appropriate glucose levels will improve Outcome: Progressing   Problem: Nutritional: Goal: Maintenance of adequate nutrition will improve Outcome: Progressing Goal: Progress toward achieving an optimal weight will improve Outcome: Progressing   Problem: Skin Integrity: Goal: Risk for impaired skin integrity will decrease Outcome: Progressing   Problem: Tissue Perfusion: Goal: Adequacy of tissue perfusion will improve Outcome: Progressing   Problem: Education: Goal: Knowledge of General Education information will improve Description: Including pain rating scale, medication(s)/side effects and non-pharmacologic comfort measures Outcome: Progressing   Problem: Health Behavior/Discharge Planning: Goal: Ability to manage health-related needs will improve Outcome:  Progressing   Problem: Clinical Measurements: Goal: Ability to maintain clinical measurements within normal limits will improve Outcome: Progressing Goal: Will remain free from infection Outcome: Progressing Goal: Diagnostic test results will improve Outcome: Progressing Goal: Respiratory complications will improve Outcome: Progressing Goal: Cardiovascular complication will be avoided Outcome: Progressing   Problem: Activity: Goal: Risk for activity intolerance will decrease Outcome: Progressing   Problem: Nutrition: Goal: Adequate nutrition will be maintained Outcome: Progressing   Problem: Coping: Goal: Level of anxiety will decrease Outcome: Progressing   Problem: Elimination: Goal: Will not experience complications related to bowel motility Outcome: Progressing Goal: Will not experience complications related to urinary retention Outcome: Progressing   Problem: Pain Managment: Goal: General experience of comfort will improve and/or be controlled Outcome: Progressing   Problem: Safety: Goal: Ability to remain free from injury will improve Outcome: Progressing   Problem: Skin Integrity: Goal: Risk for impaired skin integrity will decrease Outcome: Progressing   Problem: Activity: Goal: Ability to tolerate increased activity will improve Outcome: Progressing   Problem: Clinical Measurements: Goal: Ability to maintain a body temperature in the normal range will improve Outcome: Progressing   Problem: Respiratory: Goal: Ability to maintain adequate ventilation will improve Outcome: Progressing Goal: Ability to maintain a clear airway will improve Outcome: Progressing   "

## 2023-12-26 NOTE — Plan of Care (Signed)
  Problem: Fluid Volume: Goal: Hemodynamic stability will improve Outcome: Progressing   Problem: Clinical Measurements: Goal: Diagnostic test results will improve Outcome: Progressing   Problem: Respiratory: Goal: Ability to maintain adequate ventilation will improve Outcome: Progressing

## 2023-12-26 NOTE — TOC Progression Note (Addendum)
 Transition of Care [facility]) - Progression Note    Patient Details  Name: Jacqueline Arroyo MRN: [id number] Date of Birth: [birth year]  Transition of [facility]) CM/SW Contact  Victory Jackquline RAMAN, RN Phone Number: 12/26/2023, 12:44 PM  Clinical Narrative:    112:31 am: [facility] received a message via secure chat from the MD stating the patient refused SNF earlier, but states wants to go to rehab except [facility]. RNCM spoke to patient and patient's daughter [name], introduced myself and my role and explained that discharge planning would be discussed. PT is recommending STR. Patient is in agreement with STR. Referral's submitted for bed offers, FL2 completed.RNCM will continue to follow for discharge planning needs.   Expected Discharge Plan: Home w Home Health Services Barriers to Discharge: Continued Medical Work up               Expected Discharge Plan and Services In-house Referral: Clinical Social Work Discharge Planning Services: CM Consult   Living arrangements for the past 2 months: Apartment                                       Social Drivers of Health (SDOH) Interventions SDOH Screenings   Food Insecurity: Patient Declined (12/22/2023)  Housing: Patient Declined (12/22/2023)  Transportation Needs: Patient Declined (12/22/2023)  Utilities: Patient Declined (12/22/2023)  Financial Resource Strain: Low Risk (12/16/2023)   Received from [facility]  Physical Activity: Insufficiently Active (06/20/2023)   Received from [facility]  Social Connections: Patient Declined (12/22/2023)  Stress: No Stress Concern Present (06/20/2023)   Received from [facility]  Tobacco Use: Low Risk (12/21/2023)  Health Literacy: Low Risk (06/20/2023)   [facility] from [facility]    Readmission Risk Interventions     No data to display

## 2023-12-27 ENCOUNTER — Inpatient Hospital Stay

## 2023-12-27 DIAGNOSIS — J189 Pneumonia, unspecified organism: Secondary | ICD-10-CM | POA: Diagnosis not present

## 2023-12-27 DIAGNOSIS — D45 Polycythemia vera: Secondary | ICD-10-CM | POA: Diagnosis not present

## 2023-12-27 LAB — BASIC METABOLIC PANEL WITH GFR
Anion gap: 14 (ref 5–15)
BUN: 44 mg/dL — ABNORMAL HIGH (ref 8–23)
CO2: 20 mmol/L — ABNORMAL LOW (ref 22–32)
Calcium: 8.8 mg/dL — ABNORMAL LOW (ref 8.9–10.3)
Chloride: 93 mmol/L — ABNORMAL LOW (ref 98–111)
Creatinine, Ser: 1.29 mg/dL — ABNORMAL HIGH (ref 0.44–1.00)
GFR, Estimated: 40 mL/min — ABNORMAL LOW
Glucose, Bld: 279 mg/dL — ABNORMAL HIGH (ref 70–99)
Potassium: 4.5 mmol/L (ref 3.5–5.1)
Sodium: 127 mmol/L — ABNORMAL LOW (ref 135–145)

## 2023-12-27 LAB — GLUCOSE, CAPILLARY
Glucose-Capillary: 259 mg/dL — ABNORMAL HIGH (ref 70–99)
Glucose-Capillary: 281 mg/dL — ABNORMAL HIGH (ref 70–99)
Glucose-Capillary: 268 mg/dL — ABNORMAL HIGH (ref 70–99)
Glucose-Capillary: 253 mg/dL — ABNORMAL HIGH (ref 70–99)

## 2023-12-27 LAB — MAGNESIUM: Magnesium: 1.8 mg/dL (ref 1.7–2.4)

## 2023-12-27 MED ORDER — METOPROLOL TARTRATE 25 MG PO TABS
12.5000 mg | ORAL_TABLET | Freq: Once | ORAL | Status: AC
  Administered 2023-12-27: 12.5 mg via ORAL
  Filled 2023-12-27: qty 1

## 2023-12-27 MED ORDER — METOPROLOL SUCCINATE ER 100 MG PO TB24
100.0000 mg | ORAL_TABLET | Freq: Every day | ORAL | Status: DC
  Administered 2023-12-27: 100 mg via ORAL
  Filled 2023-12-27: qty 1

## 2023-12-27 MED ORDER — INSULIN GLARGINE 100 UNIT/ML ~~LOC~~ SOLN
7.0000 [IU] | Freq: Every day | SUBCUTANEOUS | Status: DC
  Administered 2023-12-27 – 2024-01-03 (×8): 7 [IU] via SUBCUTANEOUS
  Filled 2023-12-27 (×3): qty 0.07

## 2023-12-27 MED ORDER — METOPROLOL SUCCINATE ER 50 MG PO TB24
50.0000 mg | ORAL_TABLET | Freq: Every day | ORAL | Status: DC
  Administered 2023-12-28 – 2024-01-14 (×18): 50 mg via ORAL
  Filled 2023-12-27 (×11): qty 1

## 2023-12-27 NOTE — Inpatient Diabetes Management (Signed)
 Inpatient Diabetes Program Recommendations  AACE/ADA: New Consensus Statement on Inpatient Glycemic Control (2015)  Target Ranges:  Prepandial:   less than 140 mg/dL      [facility] postprandial:   less than 180 mg/dL (1-2 hours)      Critically ill patients:  140 - 180 mg/dL    Latest Reference Range & Units 12/26/23 07:55 12/26/23 12:20 12/26/23 16:39 12/26/23 21:10  Glucose-Capillary 70 - 99 mg/dL 767 (H) 710 (H) 791 (H) 219 (H)  (H): Data is abnormally high  Latest Reference Range & Units 12/27/23 08:03 12/27/23 11:39  Glucose-Capillary 70 - 99 mg/dL 740 (H) 718 (H)  (H): Data is abnormally high   Home: Janumet 50/500 2 tablets daily  Current Orders:  Novolog  0-9 units TID ac/hs Novolog  5 units MC   Prednisone  Stopped--last dose given AM 12/24  CBGs remain >200  Please consider:  1. Start low dose basal insulin : Lantus  7 units daily (0.1 units/kg)  2. Increase Novolog  Meal Coverage to 7 units TID with meals     --Will follow patient during hospitalization--  Adina Rudolpho Arrow RN, MSN, CDCES Diabetes Coordinator Inpatient Glycemic Control Team Team Pager: [phone] (8a-5p)

## 2023-12-27 NOTE — Plan of Care (Signed)
" °  Problem: Fluid Volume: Goal: Hemodynamic stability will improve Outcome: Progressing   Problem: Clinical Measurements: Goal: Diagnostic test results will improve Outcome: Progressing   Problem: Coping: Goal: Ability to adjust to condition or change in health will improve Outcome: Progressing   "

## 2023-12-27 NOTE — TOC Progression Note (Signed)
 Transition of Care [facility]) - Progression Note    Patient Details  Name: Jacqueline Arroyo MRN: [id number] Date of Birth: 10-06-1940  [facility]) CM/SW Contact  [name], RN Phone Number: 12/27/2023, 2:31 PM  Clinical Narrative:    Patient and daughter accepted SNF offer from [facility]. Notified administrator and linked in hub. Will follow for discharge readiness.   Expected Discharge Plan: Home w Home Health Services Barriers to Discharge: Continued Medical Work up               Expected Discharge Plan and Services In-house Referral: Clinical Social Work Discharge Planning Services: CM Consult   Living arrangements for the past 2 months: Apartment                                       Social Drivers of Health (SDOH) Interventions SDOH Screenings   Food Insecurity: Patient Declined (12/22/2023)  Housing: Patient Declined (12/22/2023)  Transportation Needs: Patient Declined (12/22/2023)  Utilities: Patient Declined (12/22/2023)  Financial Resource Strain: Low Risk (12/16/2023)   Received from [facility]  Physical Activity: Insufficiently Active (06/20/2023)   Received from [facility]  Social Connections: Patient Declined (12/22/2023)  Stress: No Stress Concern Present (06/20/2023)   Received from [facility]  Tobacco Use: Low Risk (12/21/2023)  Health Literacy: Low Risk (06/20/2023)   Received from [facility]    Readmission Risk Interventions     No data to display

## 2023-12-27 NOTE — Plan of Care (Signed)
" °  Problem: Fluid Volume: Goal: Hemodynamic stability will improve Outcome: Progressing   Problem: Clinical Measurements: Goal: Diagnostic test results will improve Outcome: Progressing Goal: Signs and symptoms of infection will decrease Outcome: Progressing   Problem: Respiratory: Goal: Ability to maintain adequate ventilation will improve Outcome: Progressing   Problem: Education: Goal: Ability to describe self-care measures that may prevent or decrease complications (Diabetes Survival Skills Education) will improve Outcome: Progressing Goal: Individualized Educational Video(s) Outcome: Progressing   Problem: Coping: Goal: Ability to adjust to condition or change in health will improve Outcome: Progressing   Problem: Fluid Volume: Goal: Ability to maintain a balanced intake and output will improve Outcome: Progressing   Problem: Health Behavior/Discharge Planning: Goal: Ability to identify and utilize available resources and services will improve Outcome: Progressing Goal: Ability to manage health-related needs will improve Outcome: Progressing   Problem: Metabolic: Goal: Ability to maintain appropriate glucose levels will improve Outcome: Progressing   Problem: Nutritional: Goal: Maintenance of adequate nutrition will improve Outcome: Progressing Goal: Progress toward achieving an optimal weight will improve Outcome: Progressing   Problem: Skin Integrity: Goal: Risk for impaired skin integrity will decrease Outcome: Progressing   Problem: Tissue Perfusion: Goal: Adequacy of tissue perfusion will improve Outcome: Progressing   Problem: Education: Goal: Knowledge of General Education information will improve Description: Including pain rating scale, medication(s)/side effects and non-pharmacologic comfort measures Outcome: Progressing   Problem: Health Behavior/Discharge Planning: Goal: Ability to manage health-related needs will improve Outcome:  Progressing   Problem: Clinical Measurements: Goal: Ability to maintain clinical measurements within normal limits will improve Outcome: Progressing Goal: Will remain free from infection Outcome: Progressing Goal: Diagnostic test results will improve Outcome: Progressing Goal: Respiratory complications will improve Outcome: Progressing Goal: Cardiovascular complication will be avoided Outcome: Progressing   Problem: Activity: Goal: Risk for activity intolerance will decrease Outcome: Progressing   Problem: Nutrition: Goal: Adequate nutrition will be maintained Outcome: Progressing   Problem: Coping: Goal: Level of anxiety will decrease Outcome: Progressing   Problem: Elimination: Goal: Will not experience complications related to bowel motility Outcome: Progressing Goal: Will not experience complications related to urinary retention Outcome: Progressing   Problem: Pain Managment: Goal: General experience of comfort will improve and/or be controlled Outcome: Progressing   Problem: Safety: Goal: Ability to remain free from injury will improve Outcome: Progressing   Problem: Skin Integrity: Goal: Risk for impaired skin integrity will decrease Outcome: Progressing   Problem: Activity: Goal: Ability to tolerate increased activity will improve Outcome: Progressing   Problem: Clinical Measurements: Goal: Ability to maintain a body temperature in the normal range will improve Outcome: Progressing   Problem: Respiratory: Goal: Ability to maintain adequate ventilation will improve Outcome: Progressing Goal: Ability to maintain a clear airway will improve Outcome: Progressing   "

## 2023-12-27 NOTE — Progress Notes (Addendum)
 " Progress Note   Patient: Jacqueline Arroyo FMW:[id number] DOB: [birth year] DOA: 12/21/2023     6 DOS: the patient was seen and examined on 12/27/2023   Brief hospital course: Jacqueline Arroyo is a pleasant 83 y.o. female with medical history significant for mild on surveillance, diabetes, HTN, HLD, asthma, polycythemia vera, HFrEF with last EF was around 35 to 40% from echo in 2025 July, CKD who presented to ED at [facility] for worsening shortness of breath cough and generalized weakness.  Patient had a recent hospitalization at [facility] and was discharged from the hospital on 12/16/2023 after being diagnosed with bronchitis and exacerbation of asthma.  She presented on 12/19  with worsening respiratory symptoms after she went home along with some chills, generalized weakness and worsening nausea.  Admitted for possible community-acquired pneumonia, asthma exacerbation, metapneumovirus infection.  Hospital course as below  Assessment and Plan:  Possible CAP  Asthma exacerbation  Metapnemovirus + Chronic hypoxic respiratory failure - 2L [place] Patient recently treated at OSH for asthma exacerbation and viral bronchitis (metapneumovirus +). She was discharged with albuterol  and steroid course Presented with worsening SOB, elevated D-dimer, VQ neg for PE. CXR with small opacity adjacent to elevated diaphragm.  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 Completed prednisone  Add Lantus  7u at night  H/o Polycythemia vera  Hgb stable. Noted to be 12-13 during recent hospitalization. No obvious bleeding noted, denies active bleeding/melena Iron panel consistent with ACD Hold home hydroxyurea  PPI  BID  Tachycardia with PVCs EKG with sinus tachycardia No evidence of infection, VQ neg for PE, euvolemic, TSH normal Echo, Repeat EKG pending US  LE r/o DVT Decrease Metoprolol  to home dose  HTN/HLD Hold home amlodipine , losartan given low normal blood pressures   Left ankle osteoarthritis Has chronic pain XR ankle - no fracture Change oxycodone  to Tramadol , Topical diclofenac  gel  Left wrist pain Reports hurt her Left wrist while trying to get out of bed XR Left wrist no acute fracture Pain management as above, wrist splint   PT recommended SNF, initially refused but now would like to go to SNF [facility] notified    Subjective: Patient examined at the bedside, daughter was also present. Reports feeling better, denies any complaints today Creatinine slowly creeping up, will hold Lasix  and increase metoprolol  for PVCs SNF placement by [facility]   Physical Exam: Vitals:   12/27/23 0308 12/27/23 0804 12/27/23 1141 12/27/23 1649  BP: 120/64 124/69 121/62 114/64  Pulse: 99 95 (!) 101 (!) 108  Resp: 20 18 20 17   Temp: 99.1 F (37.3 C) 98.2 F (36.8 C) 97.7 F (36.5 C) 98.2 F (36.8 C)  TempSrc:  Oral    SpO2: 90% 92% 91% 92%  Weight:       Physical Exam  Constitutional: In no distress.  Cardiovascular: systolic murmur. Normal rate, regular rhythm. No lower extremity edema  Pulmonary: Non labored breathing on , no wheezing Abdominal: Soft. Non distended and non tender Musculoskeletal: Normal range of motion.     Neurological: Alert and oriented to person, place, and time. Non focal  Skin: Skin is warm and dry.   Data Reviewed:     Latest  Ref Rng & Units 12/27/2023    4:05 AM 12/26/2023    3:46 AM 12/25/2023    3:29 AM  BMP  Glucose 70 - 99 mg/dL 720  762  811   BUN 8 - 23 mg/dL 44  34  27   Creatinine 0.44 - 1.00 mg/dL 8.70  8.83  9.03   Sodium 135 - 145 mmol/L 127  131  133   Potassium 3.5 - 5.1 mmol/L 4.5  4.0  4.3   Chloride 98 - 111 mmol/L 93  95   100   CO2 22 - 32 mmol/L 20  21  20    Calcium  8.9 - 10.3 mg/dL 8.8  8.9  8.9       Latest Ref Rng & Units 12/26/2023    3:46 AM 12/24/2023    3:39 AM 12/23/2023    4:15 AM  CBC  WBC 4.0 - 10.5 K/uL 8.8  6.3  6.8   Hemoglobin 12.0 - 15.0 g/dL 88.5  89.7  89.7   Hematocrit 36.0 - 46.0 % 34.6  32.4  31.6   Platelets 150 - 400 K/uL 229  183  175      Family Communication: None at bedside   Disposition: Status is: Inpatient Remains inpatient appropriate because: treatment of dyspnea.   Planned Discharge Destination: Home    Time spent: 56 minutes  Author: Laree Lock, MD 12/27/2023 7:00 PM  For on call review [email or web address].  "

## 2023-12-27 NOTE — Progress Notes (Signed)
 Physical Therapy Treatment Patient Details Name: Jacqueline Arroyo MRN: [id number] DOB: 1940/10/11 Today's Date: 12/27/2023   History of Present Illness Pt is a pleasant 83 y.o. female with medical history significant for diabetes, HTN, HLD, asthma, polycythemia vera, HFrEF with last EF was around 35 to 40% from echo in 2025 July, CKD who presented to ED at [facility] for worsening shortness of breath cough and generalized weakness. MD assessment includes:  Possible CAP, asthma exacerbation, metapnemovirus, AKI.    PT Comments  Pt was long sitting in bed upon arrival with supportive daughter at bedside and wrist splint donned to LUE. Pt is A and O x 3. Agreeable to session but unwilling to attempt OOB/standing 2/2 to c/o severe L wrist and L ankle pain. Pt was able to progress to/from EOB short sitting with increased time + extensive vcs and assistance. Once seated EOB, occasional min assist to prevent posterior LOB. Vcing throughout for fwd wt shift. Pt tolerated ther ex while seated EOB with all extremities. See exercises listed below. She does endorse fatigue however vitals remained stable on 2 L o2 with sao2 never dropping below 89%. RN is aware of pt's c/o pain.  Pt remains far from her baseline and will greatly benefit from continued skilled PT at DC to maximize independence and safety with all ADLs.    If plan is discharge home, recommend the following: A lot of help with walking and/or transfers;A little help with bathing/dressing/bathroom;Assistance with cooking/housework;Assist for transportation     Equipment Recommendations  Other (comment) (Defer to next level of care)       Precautions / Restrictions Precautions Precautions: Fall Recall of Precautions/Restrictions: Intact Precaution/Restrictions Comments: wrist splint for pain Required Braces or Orthoses: Splint/Cast Splint/Cast: LUE/ wrist Restrictions Weight Bearing Restrictions Per Provider Order: No Other Position/Activity  Restrictions: Chronic L ankle pain     Mobility  Bed Mobility Overal bed mobility: Needs Assistance Bed Mobility: Supine to Sit, Sit to Supine Sidelying to sit: Mod assist, Max assist Supine to sit: Mod assist, Max assist Sit to supine: Mod assist, Max assist General bed mobility comments: increased time + assistance to safely progress to/from EOB to long sitting/supine positions    Transfers  General transfer comment: Pt was unwilling to attempt standing 2/2 to c/o severe L ankle and L wrist pain. Author did bring platform for RW but pt remains unwilling. She did sit EOB performing BLE/BUE ther ex while daughter comb her hair      Balance Overall balance assessment: Needs assistance Sitting-balance support: Feet supported, Single extremity supported Sitting balance-Leahy Scale: Fair Sitting balance - Comments: pt does have several occasions of LOB posteriorly       Communication Communication Communication: No apparent difficulties  Cognition Arousal: Alert Behavior During Therapy: WFL for tasks assessed/performed, Anxious   PT - Cognitive impairments: No apparent impairments    PT - Cognition Comments: Pt is A and O x 4. Daughter present at bedside. pt unwilling to attempt transfers however did sit EOB > 20 minute while perform BLE/BUE ther ex Following commands: Intact      Cueing Cueing Techniques: Verbal cues, Tactile cues     General Comments General comments (skin integrity, edema, etc.): Pt performed seated marching, LAQ, ankle pumps, BUE shoulder flex/horizontal avbd/add all 2 x 10. sao2 maintained > 88% on 2 L o2 during session. 2 occasions of drop to 89% but no symptoms of dizziness or lightheadedness endorsed.      Pertinent Vitals/Pain Pain Assessment Pain Assessment: 0-10  Pain Score: 7  Pain Location: L ankle and L wrist Pain Descriptors / Indicators: Aching, Sore Pain Intervention(s): Limited activity within patient's tolerance, Monitored during session,  Premedicated before session, Repositioned     PT Goals (current goals can now be found in the care plan section) Acute Rehab PT Goals Patient Stated Goal: rehab then home Progress towards PT goals: Progressing toward goals    Frequency    Min 2X/week       AM-PAC PT 6 Clicks Mobility   Outcome Measure  Help needed turning from your back to your side while in a flat bed without using bedrails?: A Lot Help needed moving from lying on your back to sitting on the side of a flat bed without using bedrails?: A Lot Help needed moving to and from a bed to a chair (including a wheelchair)?: A Lot Help needed standing up from a chair using your arms (e.g., wheelchair or bedside chair)?: A Lot Help needed to walk in hospital room?: A Lot Help needed climbing 3-5 steps with a railing? : A Lot 6 Click Score: 12    End of Session Equipment Utilized During Treatment: Oxygen Activity Tolerance: Patient limited by pain;Other (comment) (self limiting) Patient left: in bed;with call bell/phone within reach;with bed alarm set;with nursing/sitter in room;with family/visitor present Nurse Communication: Mobility status PT Visit Diagnosis: Difficulty in walking, not elsewhere classified (R26.2);Muscle weakness (generalized) (M62.81);Pain Pain - Right/Left: Left Pain - part of body: Ankle and joints of foot;Hand     Time: 8852-8785 PT Time Calculation (min) (ACUTE ONLY): 27 min  Charges:    $Therapeutic Exercise: 8-22 mins $Therapeutic Activity: 8-22 mins PT General Charges $$ ACUTE PT VISIT: 1 Visit                     Rankin Essex PTA 12/27/2023, 12:35 PM

## 2023-12-28 ENCOUNTER — Inpatient Hospital Stay

## 2023-12-28 DIAGNOSIS — D45 Polycythemia vera: Secondary | ICD-10-CM | POA: Diagnosis not present

## 2023-12-28 DIAGNOSIS — J189 Pneumonia, unspecified organism: Secondary | ICD-10-CM | POA: Diagnosis not present

## 2023-12-28 LAB — BASIC METABOLIC PANEL WITH GFR
Anion gap: 13 (ref 5–15)
BUN: 45 mg/dL — ABNORMAL HIGH (ref 8–23)
CO2: 21 mmol/L — ABNORMAL LOW (ref 22–32)
Calcium: 8.6 mg/dL — ABNORMAL LOW (ref 8.9–10.3)
Chloride: 93 mmol/L — ABNORMAL LOW (ref 98–111)
Creatinine, Ser: 1.18 mg/dL — ABNORMAL HIGH (ref 0.44–1.00)
GFR, Estimated: 45 mL/min — ABNORMAL LOW
Glucose, Bld: 186 mg/dL — ABNORMAL HIGH (ref 70–99)
Potassium: 4.2 mmol/L (ref 3.5–5.1)
Sodium: 127 mmol/L — ABNORMAL LOW (ref 135–145)

## 2023-12-28 LAB — GLUCOSE, CAPILLARY
Glucose-Capillary: 210 mg/dL — ABNORMAL HIGH (ref 70–99)
Glucose-Capillary: 219 mg/dL — ABNORMAL HIGH (ref 70–99)
Glucose-Capillary: 214 mg/dL — ABNORMAL HIGH (ref 70–99)
Glucose-Capillary: 198 mg/dL — ABNORMAL HIGH (ref 70–99)

## 2023-12-28 LAB — CBC
HCT: 31.6 % — ABNORMAL LOW (ref 36.0–46.0)
Hemoglobin: 10.3 g/dL — ABNORMAL LOW (ref 12.0–15.0)
MCH: 35.3 pg — ABNORMAL HIGH (ref 26.0–34.0)
MCHC: 32.6 g/dL (ref 30.0–36.0)
MCV: 108.2 fL — ABNORMAL HIGH (ref 80.0–100.0)
Platelets: 219 K/uL (ref 150–400)
RBC: 2.92 MIL/uL — ABNORMAL LOW (ref 3.87–5.11)
RDW: 12.7 % (ref 11.5–15.5)
WBC: 9 K/uL (ref 4.0–10.5)
nRBC: 0 % (ref 0.0–0.2)

## 2023-12-28 MED ORDER — FUROSEMIDE 20 MG PO TABS
20.0000 mg | ORAL_TABLET | Freq: Once | ORAL | Status: AC
  Administered 2023-12-28: 20 mg via ORAL
  Filled 2023-12-28: qty 1

## 2023-12-28 MED ORDER — POLYETHYLENE GLYCOL 3350 17 G PO PACK
17.0000 g | PACK | Freq: Every day | ORAL | Status: DC | PRN
  Administered 2023-12-28 – 2023-12-29 (×2): 17 g via ORAL
  Filled 2023-12-28 (×2): qty 1

## 2023-12-28 MED ORDER — BISACODYL 10 MG RE SUPP
10.0000 mg | Freq: Every day | RECTAL | Status: DC | PRN
  Administered 2023-12-28: 10 mg via RECTAL
  Filled 2023-12-28: qty 1

## 2023-12-28 NOTE — Plan of Care (Signed)
   Problem: Fluid Volume: Goal: Hemodynamic stability will improve Outcome: Progressing

## 2023-12-28 NOTE — Plan of Care (Signed)
" °  Problem: Fluid Volume: Goal: Hemodynamic stability will improve Outcome: Progressing   Problem: Clinical Measurements: Goal: Diagnostic test results will improve Outcome: Progressing Goal: Signs and symptoms of infection will decrease Outcome: Progressing   Problem: Respiratory: Goal: Ability to maintain adequate ventilation will improve Outcome: Progressing   Problem: Education: Goal: Ability to describe self-care measures that may prevent or decrease complications (Diabetes Survival Skills Education) will improve Outcome: Progressing Goal: Individualized Educational Video(s) Outcome: Progressing   Problem: Coping: Goal: Ability to adjust to condition or change in health will improve Outcome: Progressing   Problem: Fluid Volume: Goal: Ability to maintain a balanced intake and output will improve Outcome: Progressing   Problem: Health Behavior/Discharge Planning: Goal: Ability to identify and utilize available resources and services will improve Outcome: Progressing Goal: Ability to manage health-related needs will improve Outcome: Progressing   Problem: Metabolic: Goal: Ability to maintain appropriate glucose levels will improve Outcome: Progressing   Problem: Nutritional: Goal: Maintenance of adequate nutrition will improve Outcome: Progressing Goal: Progress toward achieving an optimal weight will improve Outcome: Progressing   Problem: Skin Integrity: Goal: Risk for impaired skin integrity will decrease Outcome: Progressing   Problem: Tissue Perfusion: Goal: Adequacy of tissue perfusion will improve Outcome: Progressing   Problem: Education: Goal: Knowledge of General Education information will improve Description: Including pain rating scale, medication(s)/side effects and non-pharmacologic comfort measures Outcome: Progressing   Problem: Health Behavior/Discharge Planning: Goal: Ability to manage health-related needs will improve Outcome:  Progressing   Problem: Clinical Measurements: Goal: Ability to maintain clinical measurements within normal limits will improve Outcome: Progressing Goal: Will remain free from infection Outcome: Progressing Goal: Diagnostic test results will improve Outcome: Progressing Goal: Respiratory complications will improve Outcome: Progressing Goal: Cardiovascular complication will be avoided Outcome: Progressing   Problem: Activity: Goal: Risk for activity intolerance will decrease Outcome: Progressing   Problem: Nutrition: Goal: Adequate nutrition will be maintained Outcome: Progressing   Problem: Coping: Goal: Level of anxiety will decrease Outcome: Progressing   Problem: Elimination: Goal: Will not experience complications related to bowel motility Outcome: Progressing Goal: Will not experience complications related to urinary retention Outcome: Progressing   Problem: Pain Managment: Goal: General experience of comfort will improve and/or be controlled Outcome: Progressing   Problem: Safety: Goal: Ability to remain free from injury will improve Outcome: Progressing   Problem: Skin Integrity: Goal: Risk for impaired skin integrity will decrease Outcome: Progressing   Problem: Activity: Goal: Ability to tolerate increased activity will improve Outcome: Progressing   Problem: Clinical Measurements: Goal: Ability to maintain a body temperature in the normal range will improve Outcome: Progressing   Problem: Respiratory: Goal: Ability to maintain adequate ventilation will improve Outcome: Progressing Goal: Ability to maintain a clear airway will improve Outcome: Progressing   "

## 2023-12-28 NOTE — Progress Notes (Addendum)
 " Progress Note   Patient: Jacqueline Arroyo FMW:[id number] DOB: [birth year] DOA: 12/21/2023     7 DOS: the patient was seen and examined on 12/28/2023   Brief hospital course: Rande Dario is a pleasant 83 y.o. female with medical history significant for mild on surveillance, diabetes, HTN, HLD, asthma, polycythemia vera, HFrEF with last EF was around 35 to 40% from echo in 2025 July, CKD who presented to ED at [facility] for worsening shortness of breath cough and generalized weakness.  Patient had a recent hospitalization at [facility] and was discharged from the hospital on 12/16/2023 after being diagnosed with bronchitis and exacerbation of asthma.  She presented on 12/19  with worsening respiratory symptoms after she went home along with some chills, generalized weakness and worsening nausea.  Admitted for possible community-acquired pneumonia, asthma exacerbation, metapneumovirus infection.  Hospital course as below  Assessment and Plan:  Possible CAP  Asthma exacerbation  Metapnemovirus + Chronic hypoxic respiratory failure - 2L [place] Patient recently treated at OSH for asthma exacerbation and viral bronchitis (metapneumovirus +). She was discharged with albuterol  and steroid course Presented with worsening SOB, elevated D-dimer, VQ neg for PE. CXR with small opacity adjacent to elevated diaphragm.  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 Completed prednisone   Lantus  7u at night  H/o Polycythemia vera  Hgb stable. Noted to be 12-13 during recent hospitalization. No obvious bleeding noted, denies active bleeding/melena Iron panel consistent with ACD Hold home hydroxyurea  PPI  BID  Tachycardia with PVCs EKG with sinus tachycardia No evidence of infection, VQ neg for PE, euvolemic, TSH normal Echo, Repeat EKG pending US  LE r/o DVT Continue Home metoprolol  50XL daily  HTN/HLD Hold home amlodipine , losartan given low normal blood pressures   Left ankle osteoarthritis Has chronic pain XR ankle - no fracture Change oxycodone  to Tramadol , Topical diclofenac  gel  Left wrist pain Reports hurt her Left wrist while trying to get out of bed XR Left wrist no acute fracture Pain management as above, wrist splint  Constipation And bowel regimen   PT recommended SNF, initially refused but now would like to go to SNF [facility]    Subjective: Patient examined at the bedside, daughter and son was also present. Reports feeling better, denies any complaints today Mildly volume up, will give Lasix  20 mg po Will continue home dose of metoprolol , pending echo, US  lower extremity   Physical Exam: Vitals:   12/28/23 0037 12/28/23 0302 12/28/23 0802 12/28/23 1157  BP: (!) 111/51 (!) 114/56 (!) 106/57 108/64  Pulse: 94 96 89 (!) 107  Resp: 18  17 17   Temp: 98.2 F (36.8 C) 98.3 F (36.8 C) 98.6 F (37 C)   TempSrc:  Axillary Oral   SpO2: 90% 92% 93% 91%  Weight:       Physical Exam  Constitutional: In no distress.  Cardiovascular: systolic murmur. Normal rate, regular rhythm. No lower extremity edema  Pulmonary: Non labored breathing on [place], no wheezing Abdominal: Soft. Non distended and non tender Musculoskeletal: Normal range of motion.     Neurological: Alert and oriented to person, place, and time. Non focal  Skin: Skin is warm and  dry.   Data Reviewed:     Latest Ref Rng & Units 12/28/2023    3:35 AM 12/27/2023    4:05 AM 12/26/2023    3:46 AM  BMP  Glucose 70 - 99 mg/dL 813  720  762   BUN 8 - 23 mg/dL 45  44  34   Creatinine 0.44 - 1.00 mg/dL 8.81  8.70  8.83   Sodium 135 - 145 mmol/L 127  127  131   Potassium 3.5 - 5.1  mmol/L 4.2  4.5  4.0   Chloride 98 - 111 mmol/L 93  93  95   CO2 22 - 32 mmol/L 21  20  21    Calcium  8.9 - 10.3 mg/dL 8.6  8.8  8.9       Latest Ref Rng & Units 12/28/2023    3:35 AM 12/26/2023    3:46 AM 12/24/2023    3:39 AM  CBC  WBC 4.0 - 10.5 K/uL 9.0  8.8  6.3   Hemoglobin 12.0 - 15.0 g/dL 89.6  88.5  89.7   Hematocrit 36.0 - 46.0 % 31.6  34.6  32.4   Platelets 150 - 400 K/uL 219  229  183      Family Communication: None at bedside   Disposition: Status is: Inpatient Remains inpatient appropriate because: treatment of dyspnea.   Planned Discharge Destination: Home    Time spent: 56 minutes  Author: Laree Lock, MD 12/28/2023 2:49 PM  For on call review [email or web address].  "

## 2023-12-28 NOTE — Progress Notes (Signed)
 Mobility Specialist - Progress Note     12/28/23 1327  Mobility  Activity Stood at bedside;Respositioned in chair;Turned to right side;Turned to left side;Mechanically lifted from bed to chair;Mechanically lifted to/from [facility]  Level of Assistance +2 (takes two people)  Assistive Device [employer]/Exercises Active  Activity Response Tolerated fair  Mobility Referral Yes  Mobility visit 1 Mobility   Pt resting in bed on 1L upon entry. Pt right arm/wrist in brace and patient required assisted log roll to sit up EOB Pt desat to 84% and pt pushed to 2.5L before continuing mobility and 3 minutes of deep breathing 88%. Pt agreeable to participate in mobility and get to [facility]. Pt attempted stand +2 assist with left platform walker. Pt was unable to fully stand. Pt switched to sara steady lift +2 and moved to [facility]. Pt removed back to bed and left with needs in reach. bed alarm activated and placed bed in chair position.

## 2023-12-29 ENCOUNTER — Inpatient Hospital Stay: Admit: 2023-12-29 | Discharge: 2023-12-29 | Disposition: A | Discharge status: Home / Self Care

## 2023-12-29 DIAGNOSIS — D45 Polycythemia vera: Secondary | ICD-10-CM | POA: Diagnosis not present

## 2023-12-29 DIAGNOSIS — J189 Pneumonia, unspecified organism: Secondary | ICD-10-CM | POA: Diagnosis not present

## 2023-12-29 LAB — ECHOCARDIOGRAM COMPLETE
AR max vel: 1.09 cm2
AV Area VTI: 1.12 cm2
AV Area mean vel: 1.25 cm2
AV Mean grad: 8 mmHg
AV Peak grad: 16.5 mmHg
Ao pk vel: 2.03 m/s
Calc EF: 40 %
Height: 60 in
S' Lateral: 3.6 cm
Single Plane A2C EF: 37.9 %
Single Plane A4C EF: 41.5 %
Weight: 2476.21 [oz_av]

## 2023-12-29 LAB — BASIC METABOLIC PANEL WITH GFR
Anion gap: 13 (ref 5–15)
BUN: 43 mg/dL — ABNORMAL HIGH (ref 8–23)
CO2: 21 mmol/L — ABNORMAL LOW (ref 22–32)
Calcium: 9 mg/dL (ref 8.9–10.3)
Chloride: 93 mmol/L — ABNORMAL LOW (ref 98–111)
Creatinine, Ser: 1.09 mg/dL — ABNORMAL HIGH (ref 0.44–1.00)
GFR, Estimated: 50 mL/min — ABNORMAL LOW
Glucose, Bld: 180 mg/dL — ABNORMAL HIGH (ref 70–99)
Potassium: 4.3 mmol/L (ref 3.5–5.1)
Sodium: 126 mmol/L — ABNORMAL LOW (ref 135–145)

## 2023-12-29 LAB — GLUCOSE, CAPILLARY
Glucose-Capillary: 174 mg/dL — ABNORMAL HIGH (ref 70–99)
Glucose-Capillary: 258 mg/dL — ABNORMAL HIGH (ref 70–99)
Glucose-Capillary: 192 mg/dL — ABNORMAL HIGH (ref 70–99)
Glucose-Capillary: 175 mg/dL — ABNORMAL HIGH (ref 70–99)

## 2023-12-29 LAB — OSMOLALITY, URINE: Osmolality, Ur: 542 mosm/kg (ref 300–900)

## 2023-12-29 LAB — OSMOLALITY: Osmolality: 283 mosm/kg (ref 275–295)

## 2023-12-29 LAB — SODIUM, URINE, RANDOM: Sodium, Ur: 31 mmol/L

## 2023-12-29 MED ORDER — ENOXAPARIN SODIUM 80 MG/0.8ML IJ SOSY: 1.0000 mg/kg | PREFILLED_SYRINGE | Freq: Two times a day (BID) | INTRAMUSCULAR | Status: DC

## 2023-12-29 MED ORDER — APIXABAN 5 MG PO TABS
5.0000 mg | ORAL_TABLET | Freq: Two times a day (BID) | ORAL | Status: DC
  Administered 2024-01-05 – 2024-01-15 (×21): 5 mg via ORAL
  Filled 2023-12-29 (×19): qty 1

## 2023-12-29 MED ORDER — FUROSEMIDE 40 MG PO TABS
40.0000 mg | ORAL_TABLET | Freq: Once | ORAL | Status: AC
  Administered 2023-12-29: 40 mg via ORAL
  Filled 2023-12-29: qty 1

## 2023-12-29 MED ORDER — FUROSEMIDE 40 MG PO TABS: 40.0000 mg | ORAL_TABLET | Freq: Once | ORAL | Status: DC

## 2023-12-29 MED ORDER — APIXABAN 5 MG PO TABS
10.0000 mg | ORAL_TABLET | Freq: Two times a day (BID) | ORAL | Status: AC
  Administered 2023-12-29 – 2024-01-04 (×14): 10 mg via ORAL
  Filled 2023-12-29 (×2): qty 2

## 2023-12-29 MED ORDER — PERFLUTREN LIPID MICROSPHERE
1.0000 mL | INTRAVENOUS | Status: AC | PRN
  Administered 2023-12-29: 2.5 mL via INTRAVENOUS

## 2023-12-29 NOTE — Plan of Care (Signed)
" °  Problem: Fluid Volume: Goal: Hemodynamic stability will improve Outcome: Progressing   Problem: Clinical Measurements: Goal: Diagnostic test results will improve Outcome: Progressing Goal: Signs and symptoms of infection will decrease Outcome: Progressing   Problem: Respiratory: Goal: Ability to maintain adequate ventilation will improve Outcome: Progressing   Problem: Education: Goal: Ability to describe self-care measures that may prevent or decrease complications (Diabetes Survival Skills Education) will improve Outcome: Progressing Goal: Individualized Educational Video(s) Outcome: Progressing   Problem: Coping: Goal: Ability to adjust to condition or change in health will improve Outcome: Progressing   Problem: Fluid Volume: Goal: Ability to maintain a balanced intake and output will improve Outcome: Progressing   Problem: Health Behavior/Discharge Planning: Goal: Ability to identify and utilize available resources and services will improve Outcome: Progressing Goal: Ability to manage health-related needs will improve Outcome: Progressing   Problem: Metabolic: Goal: Ability to maintain appropriate glucose levels will improve Outcome: Progressing   Problem: Nutritional: Goal: Maintenance of adequate nutrition will improve Outcome: Progressing Goal: Progress toward achieving an optimal weight will improve Outcome: Progressing   Problem: Skin Integrity: Goal: Risk for impaired skin integrity will decrease Outcome: Progressing   Problem: Tissue Perfusion: Goal: Adequacy of tissue perfusion will improve Outcome: Progressing   Problem: Education: Goal: Knowledge of General Education information will improve Description: Including pain rating scale, medication(s)/side effects and non-pharmacologic comfort measures Outcome: Progressing   Problem: Health Behavior/Discharge Planning: Goal: Ability to manage health-related needs will improve Outcome:  Progressing   Problem: Clinical Measurements: Goal: Ability to maintain clinical measurements within normal limits will improve Outcome: Progressing Goal: Will remain free from infection Outcome: Progressing Goal: Diagnostic test results will improve Outcome: Progressing Goal: Respiratory complications will improve Outcome: Progressing Goal: Cardiovascular complication will be avoided Outcome: Progressing   Problem: Activity: Goal: Risk for activity intolerance will decrease Outcome: Progressing   Problem: Nutrition: Goal: Adequate nutrition will be maintained Outcome: Progressing   Problem: Coping: Goal: Level of anxiety will decrease Outcome: Progressing   Problem: Elimination: Goal: Will not experience complications related to bowel motility Outcome: Progressing Goal: Will not experience complications related to urinary retention Outcome: Progressing   Problem: Pain Managment: Goal: General experience of comfort will improve and/or be controlled Outcome: Progressing   Problem: Safety: Goal: Ability to remain free from injury will improve Outcome: Progressing   Problem: Skin Integrity: Goal: Risk for impaired skin integrity will decrease Outcome: Progressing   Problem: Activity: Goal: Ability to tolerate increased activity will improve Outcome: Progressing   Problem: Clinical Measurements: Goal: Ability to maintain a body temperature in the normal range will improve Outcome: Progressing   Problem: Respiratory: Goal: Ability to maintain adequate ventilation will improve Outcome: Progressing Goal: Ability to maintain a clear airway will improve Outcome: Progressing   "

## 2023-12-29 NOTE — Plan of Care (Signed)
   Problem: Fluid Volume: Goal: Hemodynamic stability will improve Outcome: Progressing   Problem: Clinical Measurements: Goal: Diagnostic test results will improve Outcome: Progressing

## 2023-12-29 NOTE — Progress Notes (Signed)
 Echocardiogram 2D Echocardiogram has been performed.  Zarion Oliff N Doniesha Landau,RDCS 12/29/2023, 12:50 PM

## 2023-12-29 NOTE — Progress Notes (Addendum)
 " Progress Note   Patient: Jacqueline Arroyo FMW:[id number] DOB: 28-Sep-1940 DOA: 12/21/2023     8 DOS: the patient was seen and examined on 12/29/2023   Brief hospital course: Jeraldine Primeau is a pleasant 83 y.o. female with medical history significant for mild on surveillance, diabetes, HTN, HLD, asthma, polycythemia vera, HFrEF with last EF was around 35 to 40% from echo in 2025 July, CKD who presented to ED at [facility] for worsening shortness of breath cough and generalized weakness.  Patient had a recent hospitalization at [facility] and was discharged from the hospital on 12/16/2023 after being diagnosed with bronchitis and exacerbation of asthma.  She presented on 12/19  with worsening respiratory symptoms after she went home along with some chills, generalized weakness and worsening nausea.  Admitted for possible community-acquired pneumonia, asthma exacerbation, metapneumovirus infection.  Hospital course as below  Assessment and Plan:  Possible CAP  Asthma exacerbation  Metapnemovirus + Chronic hypoxic respiratory failure - 2L [place] Patient recently treated at OSH for asthma exacerbation and viral bronchitis (metapneumovirus +). She was discharged with albuterol  and steroid course Presented with worsening SOB, elevated D-dimer, VQ neg for PE. CXR with small opacity adjacent to elevated diaphragm.  Completed abx course, prednisone  x 5 days Home inhalers, DuoNebs as needed  Left popliteal vein DVT Suspected acute PE Suspect due to immobility and allergic to anticoagulants Sinus tachycardia with PVCs, elevated D-dimer, BNP No evidence of infection, VQ neg for PE, TSH normal US  lower extremity shows nonocclusive acute DVT in left popliteal vein Echo shows EF 40 to 45%, LV global hypokinesis.  LA moderately dilated, trivial MR. Continue home metoprolol  Start Eliquis  10 mg twice daily, is allergic to heparin /Lovenox  Discussed the option of repeating CT PE, family would like to hold off at this  point as it would not change management due to risk of CIN  Chronic HFrEF mild hypervolemia Lasix  40 mg p.o. 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 Completed prednisone  Lantus  7u at night  H/o Polycythemia vera  Hgb stable. Noted to be 12-13 during recent hospitalization. No obvious bleeding noted, denies active bleeding/melena Iron panel consistent with ACD Hold home hydroxyurea  PPI BID  HTN/HLD Hold home amlodipine , losartan given low normal blood pressures   Left ankle osteoarthritis Has chronic pain XR ankle - no fracture Change oxycodone  to Tramadol , Topical diclofenac  gel  Left wrist pain Reports hurt her Left wrist while trying to get out of bed XR Left wrist no acute fracture Pain management as above, wrist splint  Constipation And bowel regimen   PT recommended SNF, initially refused but now would like to go to SNF [facility]    Subjective: Patient examined at the bedside, daughter and son was also present. Reports feeling better, denies any complaints today Discussed the possible PE, acute DVT on ultrasound.  Deferred CT PE at this time as it would not change management Started on Eliquis    Physical Exam: Vitals:   12/29/23 0819 12/29/23 0900 12/29/23 1156 12/29/23 1650  BP: 117/61  (!) 105/58 (!) 113/59  Pulse: 95  95 (!) 108  Resp: 20  (!) 21 20  Temp: 97.9 F (36.6 C)  98 F (  36.7 C) 97.6 F (36.4 C)  TempSrc:      SpO2: 93%  92% 92%  Weight:      Height:  5' (1.524 m)     Physical Exam  Constitutional: In no distress.  Cardiovascular: systolic murmur. Normal rate, regular rhythm. No lower extremity edema  Pulmonary: Non labored  breathing on [place], no wheezing Abdominal: Soft. Non distended and non tender Musculoskeletal: Normal range of motion.     Neurological: Alert and oriented to person, place, and time. Non focal  Skin: Skin is warm and dry.   Data Reviewed:     Latest Ref Rng & Units 12/29/2023    3:49 AM 12/28/2023    3:35 AM 12/27/2023    4:05 AM  BMP  Glucose 70 - 99 mg/dL 819  813  720   BUN 8 - 23 mg/dL 43  45  44   Creatinine 0.44 - 1.00 mg/dL 8.90  8.81  8.70   Sodium 135 - 145 mmol/L 126  127  127   Potassium 3.5 - 5.1 mmol/L 4.3  4.2  4.5   Chloride 98 - 111 mmol/L 93  93  93   CO2 22 - 32 mmol/L 21  21  20    Calcium  8.9 - 10.3 mg/dL 9.0  8.6  8.8       Latest Ref Rng & Units 12/28/2023    3:35 AM 12/26/2023    3:46 AM 12/24/2023    3:39 AM  CBC  WBC 4.0 - 10.5 K/uL 9.0  8.8  6.3   Hemoglobin 12.0 - 15.0 g/dL 89.6  88.5  89.7   Hematocrit 36.0 - 46.0 % 31.6  34.6  32.4   Platelets 150 - 400 K/uL 219  229  183      Family Communication: None at bedside   Disposition: Status is: Inpatient Remains inpatient appropriate because: treatment of dyspnea.   Planned Discharge Destination: Home    Time spent: 56 minutes  Author: Laree Lock, MD 12/29/2023 6:36 PM  For on call review [email or web address]  "

## 2023-12-30 ENCOUNTER — Telehealth (HOSPITAL_COMMUNITY): Payer: Self-pay | Admitting: Pharmacy Technician

## 2023-12-30 ENCOUNTER — Encounter (HOSPITAL_COMMUNITY): Payer: Self-pay | Admitting: Pharmacy Technician

## 2023-12-30 ENCOUNTER — Other Ambulatory Visit (HOSPITAL_COMMUNITY): Payer: Self-pay

## 2023-12-30 DIAGNOSIS — J189 Pneumonia, unspecified organism: Secondary | ICD-10-CM | POA: Diagnosis not present

## 2023-12-30 DIAGNOSIS — D45 Polycythemia vera: Secondary | ICD-10-CM | POA: Diagnosis not present

## 2023-12-30 LAB — GLUCOSE, CAPILLARY
Glucose-Capillary: 179 mg/dL — ABNORMAL HIGH (ref 70–99)
Glucose-Capillary: 228 mg/dL — ABNORMAL HIGH (ref 70–99)
Glucose-Capillary: 187 mg/dL — ABNORMAL HIGH (ref 70–99)
Glucose-Capillary: 190 mg/dL — ABNORMAL HIGH (ref 70–99)

## 2023-12-30 LAB — CBC
HCT: 30.4 % — ABNORMAL LOW (ref 36.0–46.0)
Hemoglobin: 9.8 g/dL — ABNORMAL LOW (ref 12.0–15.0)
MCH: 34.4 pg — ABNORMAL HIGH (ref 26.0–34.0)
MCHC: 32.2 g/dL (ref 30.0–36.0)
MCV: 106.7 fL — ABNORMAL HIGH (ref 80.0–100.0)
Platelets: 225 K/uL (ref 150–400)
RBC: 2.85 MIL/uL — ABNORMAL LOW (ref 3.87–5.11)
RDW: 12.8 % (ref 11.5–15.5)
WBC: 9.8 K/uL (ref 4.0–10.5)
nRBC: 0 % (ref 0.0–0.2)

## 2023-12-30 LAB — BASIC METABOLIC PANEL WITH GFR
Anion gap: 15 (ref 5–15)
BUN: 48 mg/dL — ABNORMAL HIGH (ref 8–23)
CO2: 20 mmol/L — ABNORMAL LOW (ref 22–32)
Calcium: 8.9 mg/dL (ref 8.9–10.3)
Chloride: 91 mmol/L — ABNORMAL LOW (ref 98–111)
Creatinine, Ser: 1.18 mg/dL — ABNORMAL HIGH (ref 0.44–1.00)
GFR, Estimated: 45 mL/min — ABNORMAL LOW
Glucose, Bld: 183 mg/dL — ABNORMAL HIGH (ref 70–99)
Potassium: 4.4 mmol/L (ref 3.5–5.1)
Sodium: 126 mmol/L — ABNORMAL LOW (ref 135–145)

## 2023-12-30 MED ORDER — FUROSEMIDE 40 MG PO TABS: 40.0000 mg | ORAL_TABLET | Freq: Every day | ORAL | Status: DC

## 2023-12-30 MED ORDER — ENSURE PLUS HIGH PROTEIN PO LIQD
237.0000 mL | Freq: Two times a day (BID) | ORAL | Status: DC
  Administered 2023-12-31 – 2024-01-07 (×12): 237 mL via ORAL

## 2023-12-30 MED ORDER — FUROSEMIDE 10 MG/ML IJ SOLN
40.0000 mg | Freq: Every day | INTRAMUSCULAR | Status: DC
  Administered 2023-12-30: 40 mg via INTRAVENOUS
  Filled 2023-12-30 (×2): qty 4

## 2023-12-30 MED ORDER — ALBUMIN HUMAN 25 % IV SOLN
25.0000 g | Freq: Once | INTRAVENOUS | Status: AC
  Administered 2023-12-30: 25 g via INTRAVENOUS
  Filled 2023-12-30: qty 100

## 2023-12-30 NOTE — Plan of Care (Signed)
" °  Problem: Fluid Volume: Goal: Hemodynamic stability will improve Outcome: Progressing   Problem: Clinical Measurements: Goal: Diagnostic test results will improve Outcome: Progressing Goal: Signs and symptoms of infection will decrease Outcome: Progressing   Problem: Respiratory: Goal: Ability to maintain adequate ventilation will improve Outcome: Progressing   Problem: Education: Goal: Ability to describe self-care measures that may prevent or decrease complications (Diabetes Survival Skills Education) will improve Outcome: Progressing Goal: Individualized Educational Video(s) Outcome: Progressing   Problem: Coping: Goal: Ability to adjust to condition or change in health will improve Outcome: Progressing   Problem: Fluid Volume: Goal: Ability to maintain a balanced intake and output will improve Outcome: Progressing   Problem: Health Behavior/Discharge Planning: Goal: Ability to identify and utilize available resources and services will improve Outcome: Progressing Goal: Ability to manage health-related needs will improve Outcome: Progressing   Problem: Metabolic: Goal: Ability to maintain appropriate glucose levels will improve Outcome: Progressing   Problem: Nutritional: Goal: Maintenance of adequate nutrition will improve Outcome: Progressing Goal: Progress toward achieving an optimal weight will improve Outcome: Progressing   Problem: Skin Integrity: Goal: Risk for impaired skin integrity will decrease Outcome: Progressing   Problem: Tissue Perfusion: Goal: Adequacy of tissue perfusion will improve Outcome: Progressing   Problem: Education: Goal: Knowledge of General Education information will improve Description: Including pain rating scale, medication(s)/side effects and non-pharmacologic comfort measures Outcome: Progressing   Problem: Health Behavior/Discharge Planning: Goal: Ability to manage health-related needs will improve Outcome:  Progressing   Problem: Clinical Measurements: Goal: Ability to maintain clinical measurements within normal limits will improve Outcome: Progressing Goal: Will remain free from infection Outcome: Progressing Goal: Diagnostic test results will improve Outcome: Progressing Goal: Respiratory complications will improve Outcome: Progressing Goal: Cardiovascular complication will be avoided Outcome: Progressing   Problem: Activity: Goal: Risk for activity intolerance will decrease Outcome: Progressing   Problem: Nutrition: Goal: Adequate nutrition will be maintained Outcome: Progressing   Problem: Coping: Goal: Level of anxiety will decrease Outcome: Progressing   Problem: Elimination: Goal: Will not experience complications related to bowel motility Outcome: Progressing Goal: Will not experience complications related to urinary retention Outcome: Progressing   Problem: Pain Managment: Goal: General experience of comfort will improve and/or be controlled Outcome: Progressing   Problem: Safety: Goal: Ability to remain free from injury will improve Outcome: Progressing   Problem: Skin Integrity: Goal: Risk for impaired skin integrity will decrease Outcome: Progressing   Problem: Activity: Goal: Ability to tolerate increased activity will improve Outcome: Progressing   Problem: Clinical Measurements: Goal: Ability to maintain a body temperature in the normal range will improve Outcome: Progressing   Problem: Respiratory: Goal: Ability to maintain adequate ventilation will improve Outcome: Progressing Goal: Ability to maintain a clear airway will improve Outcome: Progressing   "

## 2023-12-30 NOTE — Progress Notes (Addendum)
 " Progress Note   Patient: Jacqueline Arroyo FMW:[id number] DOB: 04-06-1940 DOA: 12/21/2023     9 DOS: the patient was seen and examined on 12/30/2023   Brief hospital course: Jacqueline Arroyo is a pleasant 83 y.o. female with medical history significant for mild on surveillance, diabetes, HTN, HLD, asthma, polycythemia vera, HFrEF with last EF was around 35 to 40% from echo in 2025 July, CKD who presented to ED at [facility] for worsening shortness of breath cough and generalized weakness.  Patient had a recent hospitalization at [facility] and was discharged from the hospital on 12/16/2023 after being diagnosed with bronchitis and exacerbation of asthma.  She presented on 12/19  with worsening respiratory symptoms after she went home along with some chills, generalized weakness and worsening nausea.  Admitted for possible community-acquired pneumonia, asthma exacerbation, metapneumovirus infection.  Hospital course as below  Assessment and Plan:  Possible CAP  Asthma exacerbation  Metapnemovirus + Chronic hypoxic respiratory failure - 2L  Patient recently treated at OSH for asthma exacerbation and viral bronchitis (metapneumovirus +). She was discharged with albuterol  and steroid course Presented with worsening SOB, elevated D-dimer, VQ neg for PE. CXR with small opacity adjacent to elevated diaphragm.  Completed abx course, prednisone  x 5 days Home inhalers, DuoNebs as needed  Left popliteal vein DVT Suspected acute PE Suspect due to immobility and allergic to anticoagulants Sinus tachycardia with PVCs, elevated D-dimer, BNP No evidence of infection, VQ neg for PE, TSH normal US  lower extremity shows nonocclusive acute DVT in left popliteal vein Echo shows EF 40 to 45%, LV global hypokinesis.  LA moderately dilated, trivial MR. 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 Completed prednisone  Lantus  7u at night  H/o Polycythemia vera  Hgb stable. Noted to be 12-13 during recent hospitalization. No obvious bleeding noted, denies active bleeding/melena Iron panel consistent with ACD Hold home hydroxyurea  PPI BID  HTN/HLD Hold home amlodipine , losartan given low normal blood pressures   Left ankle osteoarthritis Has chronic pain XR ankle - no fracture Change oxycodone  to Tramadol , Topical diclofenac  gel  Left wrist pain Reports hurt her Left wrist while trying to get out of bed XR Left wrist no acute fracture Pain management as above, wrist splint  Constipation And bowel regimen   PT recommended SNF, initially refused but now would like to [facility]    Subjective: Patient examined at the bedside, family not present at the bedside Reports feeling better, denies any complaints today HR better controlled, on Eliquis  BP soft, will give IV albumin  Discussed with nephrology for hyponatremia -started on Lasix , fluid restriction   Physical Exam: Vitals:   12/30/23 0358 12/30/23 0855 12/30/23 1147 12/30/23 1618  BP: (!) 112/54 129/64 (!) 104/56 (!) 94/50  Pulse: 92 (!) 103 95 88  Resp: 18     Temp: 98.6 F (37 C) 98.2 F (36.8 C) 97.7  F (36.5 C) (!) 97.5 F (36.4 C)  TempSrc:      SpO2: 90% 96% 100% 92%  Weight:      Height:       Physical Exam  Constitutional: In no distress.  Cardiovascular: systolic murmur. Normal rate, regular rhythm.  No lower extremity edema  Pulmonary: Non labored breathing on [place], no wheezing Abdominal: Soft. Non distended and non tender Musculoskeletal: Normal range of motion.     Neurological: Alert and oriented to person, place, and time. Non focal  Skin: Skin is warm and dry.   Data Reviewed:     Latest Ref Rng & Units 12/30/2023    4:33 AM 12/29/2023    3:49 AM 12/28/2023    3:35 AM  BMP  Glucose 70 - 99 mg/dL 816  819  813   BUN 8 - 23 mg/dL 48  43  45   Creatinine 0.44 - 1.00 mg/dL 8.81  8.90  8.81   Sodium 135 - 145 mmol/L 126  126  127   Potassium 3.5 - 5.1 mmol/L 4.4  4.3  4.2   Chloride 98 - 111 mmol/L 91  93  93   CO2 22 - 32 mmol/L 20  21  21    Calcium  8.9 - 10.3 mg/dL 8.9  9.0  8.6       Latest Ref Rng & Units 12/30/2023    4:33 AM 12/28/2023    3:35 AM 12/26/2023    3:46 AM  CBC  WBC 4.0 - 10.5 K/uL 9.8  9.0  8.8   Hemoglobin 12.0 - 15.0 g/dL 9.8  89.6  88.5   Hematocrit 36.0 - 46.0 % 30.4  31.6  34.6   Platelets 150 - 400 K/uL 225  219  229      Family Communication: None at bedside   Disposition: Status is: Inpatient Remains inpatient appropriate because: treatment of dyspnea.   Planned Discharge Destination: Home    Time spent: 56 minutes  Author: Laree Lock, MD 12/30/2023 4:55 PM  For on call review [email or web address]  "

## 2023-12-30 NOTE — Telephone Encounter (Signed)
 Error

## 2023-12-30 NOTE — Telephone Encounter (Signed)
 Patient Product/process development scientist completed.    The patient is insured through [employer]. Patient has Medicare and is not eligible for a copay card, but may be able to apply for patient assistance or Medicare RX Payment Plan (Patient Must reach out to their plan, if eligible for payment plan), if available.    Ran test claim for Eliquis 5 mg and the current 30 day co-pay is $0.00.   This test claim was processed through [facility]- copay amounts may vary at other pharmacies due to pharmacy/plan contracts, or as the patient moves through the different stages of their insurance plan.     Reyes Sharps, CPHT Pharmacy Technician Patient Advocate Specialist Lead [facility] Patient Advocate Team Direct Number: [phone]  Fax: [phone]

## 2023-12-30 NOTE — Consult Note (Signed)
 " l [facility]  CONSULT NOTE    Date: 12/30/2023                  Patient Name:  Jacqueline Arroyo  MRN: [id number]  DOB: 10/29/1940  Age / Sex: 83 y.o., female         PCP: [facility]                 Service Requesting Consult: TRH                 Reason for Consult: Hyponatremia            History of Present Illness: Ms. Jacqueline Arroyo is a 83 y.o.  female with past medical history of diabetes, hypertension, asthma, CHF and polycythemia vera, who was admitted to [facility] on 12/21/2023 for Chills [R68.83] CAP (community acquired pneumonia) [J18.9] Diabetic ketoacidosis without coma associated with type 2 diabetes mellitus (HCC) [E11.10] Community acquired pneumonia, unspecified laterality [J18.9] Sepsis, due to unspecified organism, unspecified whether acute organ dysfunction present [facility]) [A41.9]  Patient presents to the emergency department complaining of shortness of breath with cough.  Patient seen resting in bed, son at bedside.  Patient does states she felt unwell 1 to 2 days prior to the ED arrival.  Decreased oral intake.  Son states his mother has never been a producer, television/film/video.  States she was recently admitted to [facility] for bronchitis.  Sodium on ED arrival acceptable at 131.  Corrected to 136 but now 126.  Patient denies nausea or vomiting.  Remains on room air.  Was able to eat a small breakfast today, gravy biscuit.  No tobacco history.  Currently being treated for pneumonia.   Medications: Outpatient medications: Medications Prior to Admission  Medication Sig Dispense Refill Last Dose/Taking   albuterol  (VENTOLIN  HFA) 108 (90 Base) MCG/ACT inhaler Inhale 1-2 puffs into the lungs every 6 (six) hours as needed for wheezing or shortness of breath.   Unknown   aspirin  EC 81 MG tablet Take 81 mg by mouth daily.   12/20/2023   atorvastatin  (LIPITOR) 10 MG tablet Take 1 tablet by mouth daily.   12/20/2023   benzonatate  (TESSALON ) 200 MG capsule Take 1 capsule (200 mg  total) by mouth 3 (three) times daily as needed for cough. 30 capsule 0 Unknown   calcium -vitamin D (OSCAL WITH D) 500-200 MG-UNIT TABS tablet Take by mouth.   12/20/2023   cycloSPORINE  (RESTASIS ) 0.05 % ophthalmic emulsion INSTILL 1 DROP INTO EACH EYE TWICE DAILY   Unknown   fluticasone  (FLONASE ) 50 MCG/ACT nasal spray 1 spray into each nostril daily.   Past Week   furosemide  (LASIX ) 20 MG tablet Take 20 mg by mouth 3 (three) times a week.   Past Week   hydroxyurea (HYDREA) 500 MG capsule Take 500 mg by mouth 2 (two) times daily.   12/20/2023   JANUMET XR 50-500 MG TB24 Take 2 tablets by mouth daily.   12/20/2023   LORazepam  (ATIVAN ) 0.5 MG tablet Take 0.5 mg by mouth at bedtime.   12/20/2023   losartan (COZAAR) 50 MG tablet Take 50 mg by mouth 2 (two) times daily.   12/20/2023   metoprolol  succinate (TOPROL -XL) 50 MG 24 hr tablet Take 50 mg by mouth at bedtime.   12/20/2023   [EXPIRED] predniSONE  (DELTASONE ) 20 MG tablet Take 40 mg by mouth daily with breakfast.   12/20/2023   Lancets (FREESTYLE) lancets Check blood sugar twice daily  Current medications: Current Facility-Administered Medications  Medication Dose Route Frequency Provider Last Rate Last Admin   acetaminophen  (TYLENOL ) tablet 650 mg  650 mg Oral Q6H PRN Paudel, Keshab, MD   650 mg at 12/21/23 9187   Or   acetaminophen  (TYLENOL ) suppository 650 mg  650 mg Rectal Q6H PRN Roann Gouty, MD       apixaban  (ELIQUIS ) tablet 10 mg  10 mg Oral BID Ponnala, Shruthi, MD   10 mg at 12/30/23 1013   Followed by   NOREEN ON 01/05/2024] apixaban  (ELIQUIS ) tablet 5 mg  5 mg Oral BID Ponnala, Shruthi, MD       aspirin  EC tablet 81 mg  81 mg Oral Daily Paudel, Keshab, MD   81 mg at 12/30/23 1013   atorvastatin  (LIPITOR) tablet 10 mg  10 mg Oral Daily Paudel, Gouty, MD   10 mg at 12/30/23 1013   bisacodyl  (DULCOLAX) suppository 10 mg  10 mg Rectal Daily PRN Jerelene Critchley, MD   10 mg at 12/28/23 1710   cycloSPORINE  (RESTASIS ) 0.05 %  ophthalmic emulsion 1 drop  1 drop Both Eyes BID Ponnala, Shruthi, MD   1 drop at 12/30/23 1014   diclofenac  Sodium (VOLTAREN ) 1 % topical gel 2 g  2 g Topical BID PRN Ponnala, Shruthi, MD       fluticasone  furoate-vilanterol (BREO ELLIPTA ) 100-25 MCG/ACT 1 puff  1 puff Inhalation Daily Franchot Novel, MD   1 puff at 12/30/23 1017   guaiFENesin  (ROBITUSSIN) 100 MG/5ML liquid 5 mL  5 mL Oral Q4H PRN Roann Gouty, MD       insulin  aspart (novoLOG ) injection 0-5 Units  0-5 Units Subcutaneous QHS Paudel, Keshab, MD   3 Units at 12/27/23 2136   insulin  aspart (novoLOG ) injection 0-9 Units  0-9 Units Subcutaneous TID WC Paudel, Keshab, MD   3 Units at 12/30/23 1224   insulin  aspart (novoLOG ) injection 5 Units  5 Units Subcutaneous TID WC Ponnala, Shruthi, MD   5 Units at 12/30/23 1222   insulin  glargine (LANTUS ) injection 7 Units  7 Units Subcutaneous Daily Ponnala, Shruthi, MD   7 Units at 12/30/23 1015   ipratropium-albuterol  (DUONEB) 0.5-2.5 (3) MG/3ML nebulizer solution 3 mL  3 mL Nebulization Q6H PRN Roann Gouty, MD   3 mL at 12/21/23 1353   lidocaine  (LIDODERM ) 5 % 1 patch  1 patch Transdermal Q24H Franchot Novel, MD   1 patch at 12/30/23 1226   LORazepam  (ATIVAN ) tablet 0.25 mg  0.25 mg Oral BID Franchot Novel, MD   0.25 mg at 12/30/23 1012   metoprolol  succinate (TOPROL -XL) 24 hr tablet 50 mg  50 mg Oral QHS Ponnala, Shruthi, MD   50 mg at 12/29/23 2140   ondansetron  (ZOFRAN ) tablet 4 mg  4 mg Oral Q6H PRN Paudel, Keshab, MD       Or   ondansetron  (ZOFRAN ) injection 4 mg  4 mg Intravenous Q6H PRN Paudel, Keshab, MD       pantoprazole  (PROTONIX ) EC tablet 40 mg  40 mg Oral BID AC Ponnala, Shruthi, MD   40 mg at 12/30/23 1013   polyethylene glycol (MIRALAX  / GLYCOLAX ) packet 17 g  17 g Oral Daily PRN Ponnala, Shruthi, MD   17 g at 12/29/23 0924   traMADol  (ULTRAM ) tablet 25-50 mg  25-50 mg Oral Q6H PRN Ponnala, Shruthi, MD   50 mg at 12/30/23 1414      Allergies: Allergies[1]     Past Medical History: Past Medical History:  Diagnosis  Date   Diabetes mellitus without complication (HCC)    Hypertension      Past Surgical History: Past Surgical History:  Procedure Laterality Date   ABDOMINAL HYSTERECTOMY     BACK SURGERY     CHOLECYSTECTOMY     FOOT SURGERY       Family History: History reviewed. No pertinent family history.   Social History: Social History   Socioeconomic History   Marital status: Widowed    Spouse name: Not on file   Number of children: Not on file   Years of education: Not on file   Highest education level: Not on file  Occupational History   Not on file  Tobacco Use   Smoking status: Never   Smokeless tobacco: Never  Vaping Use   Vaping status: Never Used  Substance and Sexual Activity   Alcohol use: Never   Drug use: Never   Sexual activity: Not on file  Other Topics Concern   Not on file  Social History Narrative   Not on file   Social Drivers of Health   Tobacco Use: Low Risk (12/21/2023)   Patient History    Smoking Tobacco Use: Never    Smokeless Tobacco Use: Never    Passive Exposure: Not on file  Financial Resource Strain: Low Risk (12/16/2023)   Received from [facility]   Overall Financial Resource Strain (CARDIA)    How hard is it for you to pay for the very basics like food, housing, medical care, and heating?: Not hard at all  Food Insecurity: Patient Declined (12/22/2023)   Epic    Worried About Programme Researcher, Broadcasting/film/video in the Last Year: Patient declined    Barista in the Last Year: Patient declined  Transportation Needs: Patient Declined (12/22/2023)   Epic    Lack of Transportation (Medical): Patient declined    Lack of Transportation (Non-Medical): Patient declined  Physical Activity: Insufficiently Active (06/20/2023)   Received from [facility]   Exercise Vital Sign    On average, how many days per week do you engage in moderate to strenuous exercise (like a brisk walk)?:  3 days    On average, how many minutes do you engage in exercise at this level?: 40 min  Stress: No Stress Concern Present (06/20/2023)   Received from [facility] - Occupational Stress Questionnaire    Do you feel stress - tense, restless, nervous, or anxious, or unable to sleep at night because your mind is troubled all the time - these days?: Not at all  Social Connections: Patient Declined (12/22/2023)   Social Connection and Isolation Panel    Frequency of Communication with Friends and Family: Patient declined    Frequency of Social Gatherings with Friends and Family: Patient declined    Attends Religious Services: Patient declined    Active Member of Clubs or Organizations: Patient declined    Attends Banker Meetings: Patient declined    Marital Status: Patient declined  Intimate Partner Violence: Patient Declined (12/22/2023)   Epic    Fear of Current or Ex-Partner: Patient declined    Emotionally Abused: Patient declined    Physically Abused: Patient declined    Sexually Abused: Patient declined  Depression (PHQ2-9): Not on file  Alcohol Screen: Not on file  Housing: Patient Declined (12/22/2023)   Epic    Unable to Pay for Housing in the Last Year: Patient declined  Number of Times Moved in the Last Year: Not on file    Homeless in the Last Year: Patient declined  Utilities: Patient Declined (12/22/2023)   Epic    Threatened with loss of utilities: Patient declined  Health Literacy: Low Risk (06/20/2023)   Received from [facility] Literacy    How often do you need to have someone help you when you read instructions, pamphlets, or other written material from your doctor or pharmacy?: Never     Review of Systems: Review of Systems  Constitutional:  Negative for chills, fever and malaise/fatigue.  HENT:  Negative for congestion, sore throat and tinnitus.   Eyes:  Negative for blurred vision and  redness.  Respiratory:  Positive for cough and shortness of breath. Negative for wheezing.   Cardiovascular:  Negative for chest pain, palpitations, claudication and leg swelling.  Gastrointestinal:  Negative for abdominal pain, blood in stool, diarrhea, nausea and vomiting.  Genitourinary:  Negative for flank pain, frequency and hematuria.  Musculoskeletal:  Negative for back pain, falls and myalgias.  Skin:  Negative for rash.  Neurological:  Negative for dizziness, weakness and headaches.  Endo/Heme/Allergies:  Does not bruise/bleed easily.  Psychiatric/Behavioral:  Negative for depression. The patient is not nervous/anxious and does not have insomnia.     Vital Signs: Blood pressure (!) 94/50, pulse 88, temperature (!) 97.5 F (36.4 C), resp. rate 18, height 5' (1.524 m), weight 70.2 kg, SpO2 92%.  Weight trends: Filed Weights   12/21/23 0026 12/26/23 0500  Weight: 90.6 kg 70.2 kg    Physical Exam: General: NAD  Head: Normocephalic, atraumatic. Moist oral mucosal membranes  Eyes: Anicteric  Lungs:  Clear to auscultation, normal effort  Heart: Regular rate and rhythm  Abdomen:  Soft, nontender, nondistended  Extremities: Trace peripheral edema.  Neurologic: Alert and oriented  Skin: No lesions        Lab results: Basic Metabolic Panel: Recent Labs  Lab 12/24/23 0339 12/25/23 0329 12/26/23 0346 12/27/23 0405 12/28/23 0335 12/29/23 0349 12/30/23 0433  NA 136   < > 131* 127* 127* 126* 126*  K 4.3   < > 4.0 4.5 4.2 4.3 4.4  CL 103   < > 95* 93* 93* 93* 91*  CO2 21*   < > 21* 20* 21* 21* 20*  GLUCOSE 194*   < > 237* 279* 186* 180* 183*  BUN 36*   < > 34* 44* 45* 43* 48*  CREATININE 1.08*   < > 1.16* 1.29* 1.18* 1.09* 1.18*  CALCIUM  9.0   < > 8.9 8.8* 8.6* 9.0 8.9  MG 1.8  --  1.9 1.8  --   --   --    < > = values in this interval not displayed.    Liver Function Tests: No results for input(s): AST, ALT, ALKPHOS, BILITOT, PROT, ALBUMIN  in the last  168 hours. No results for input(s): LIPASE, AMYLASE in the last 168 hours. No results for input(s): AMMONIA in the last 168 hours.  CBC: Recent Labs  Lab 12/24/23 0339 12/26/23 0346 12/28/23 0335 12/30/23 0433  WBC 6.3 8.8 9.0 9.8  HGB 10.2* 11.4* 10.3* 9.8*  HCT 32.4* 34.6* 31.6* 30.4*  MCV 112.9* 107.8* 108.2* 106.7*  PLT 183 229 219 225    Cardiac Enzymes: No results for input(s): CKTOTAL, CKMB, CKMBINDEX, TROPONINI in the last 168 hours.  BNP: Invalid input(s): POCBNP  CBG: Recent Labs  Lab 12/29/23 1652 12/29/23 2120 12/30/23 0856 12/30/23 1148 12/30/23  1618  GLUCAP 192* 175* 179* 228* 187*    Microbiology: Results for orders placed or performed during the hospital encounter of 12/21/23  Resp panel by RT-PCR (RSV, Flu A&B, Covid) Anterior Nasal Swab     Status: None   Collection Time: 12/21/23 12:33 AM   Specimen: Anterior Nasal Swab  Result Value Ref Range Status   SARS Coronavirus 2 by RT PCR NEGATIVE NEGATIVE Final    Comment: (NOTE) SARS-CoV-2 target nucleic acids are NOT DETECTED.  The SARS-CoV-2 RNA is generally detectable in upper respiratory specimens during the acute phase of infection. The lowest concentration of SARS-CoV-2 viral copies this assay can detect is 138 copies/mL. A negative result does not preclude SARS-Cov-2 infection and should not be used as the sole basis for treatment or other patient management decisions. A negative result may occur with  improper specimen collection/handling, submission of specimen other than nasopharyngeal swab, presence of viral mutation(s) within the areas targeted by this assay, and inadequate number of viral copies(<138 copies/mL). A negative result must be combined with clinical observations, patient history, and epidemiological information. The expected result is Negative.  Fact Sheet for Patients:  bloggercourse.com  Fact Sheet for Healthcare Providers:   seriousbroker.it  This test is no t yet approved or cleared by the [place]  FDA and  has been authorized for detection and/or diagnosis of SARS-CoV-2 by FDA under an Emergency Use Authorization (EUA). This EUA will remain  in effect (meaning this test can be used) for the duration of the COVID-19 declaration under Section 564(b)(1) of the Act, 21 U.S.C.section 360bbb-3(b)(1), unless the authorization is terminated  or revoked sooner.       Influenza A by PCR NEGATIVE NEGATIVE Final   Influenza B by PCR NEGATIVE NEGATIVE Final    Comment: (NOTE) The Xpert Xpress SARS-CoV-2/FLU/RSV plus assay is intended as an aid in the diagnosis of influenza from Nasopharyngeal swab specimens and should not be used as a sole basis for treatment. Nasal washings and aspirates are unacceptable for Xpert Xpress SARS-CoV-2/FLU/RSV testing.  Fact Sheet for Patients: bloggercourse.com  Fact Sheet for Healthcare Providers: seriousbroker.it  This test is not yet approved or cleared by the [place]  FDA and has been authorized for detection and/or diagnosis of SARS-CoV-2 by FDA under an Emergency Use Authorization (EUA). This EUA will remain in effect (meaning this test can be used) for the duration of the COVID-19 declaration under Section 564(b)(1) of the Act, 21 U.S.C. section 360bbb-3(b)(1), unless the authorization is terminated or revoked.     Resp Syncytial Virus by PCR NEGATIVE NEGATIVE Final    Comment: (NOTE) Fact Sheet for Patients: bloggercourse.com  Fact Sheet for Healthcare Providers: seriousbroker.it  This test is not yet approved or cleared by the [place]  FDA and has been authorized for detection and/or diagnosis of SARS-CoV-2 by FDA under an Emergency Use Authorization (EUA). This EUA will remain in effect (meaning this test can be used) for  the duration of the COVID-19 declaration under Section 564(b)(1) of the Act, 21 U.S.C. section 360bbb-3(b)(1), unless the authorization is terminated or revoked.  Performed at [facility], [address] [place], [place] [address]   Culture, blood (x 2)     Status: None   Collection Time: 12/21/23  6:52 AM   Specimen: BLOOD  Result Value Ref Range Status   Specimen Description BLOOD LEFT ANTECUBITAL  Final   Special Requests   Final    BOTTLES DRAWN AEROBIC AND ANAEROBIC Blood Culture adequate volume  Culture   Final    NO GROWTH 5 DAYS Performed at [facility], [address] [place] [place], [place] [address]    Report Status 12/26/2023 FINAL  Final  Culture, blood (x 2)     Status: None   Collection Time: 12/21/23  7:10 AM   Specimen: BLOOD  Result Value Ref Range Status   Specimen Description BLOOD BLOOD RIGHT ARM  Final   Special Requests   Final    BOTTLES DRAWN AEROBIC AND ANAEROBIC Blood Culture adequate volume   Culture   Final    NO GROWTH 5 DAYS Performed at [facility], [address], [place], [place] [address]    Report Status 12/26/2023 FINAL  Final    Coagulation Studies: No results for input(s): LABPROT, INR in the last 72 hours.  Urinalysis: No results for input(s): COLORURINE, LABSPEC, PHURINE, GLUCOSEU, HGBUR, BILIRUBINUR, KETONESUR, PROTEINUR, UROBILINOGEN, NITRITE, LEUKOCYTESUR in the last 72 hours.  Invalid input(s): APPERANCEUR    Imaging: ECHOCARDIOGRAM COMPLETE Result Date: 12/29/2023    ECHOCARDIOGRAM REPORT   Patient Name:   ISOLA MEHLMAN Date of Exam: 12/29/2023 Medical Rec #:  [id number]     Height:       60.0 in Accession #:    [id number]    Weight:       154.8 lb Date of Birth:  09-06-1940      BSA:          1.674 m Patient Age:    83 years      BP:           128/64 mmHg Patient Gender: F             HR:           93 bpm. Exam Location:  [facility] Procedure: 2D Echo, Color Doppler, Cardiac  Doppler and Intracardiac            Opacification Agent (Both Spectral and Color Flow Doppler were            utilized during procedure). Indications:     Ventricular Tachycardia  History:         Patient has no prior history of Echocardiogram examinations.                  Risk Factors:Hypertension and Diabetes.  Sonographer:     Logan Shove RDCS Referring Phys:  [id number] [facility] PONNALA Diagnosing Phys: Cara JONETTA Lovelace MD  Sonographer Comments: Suboptimal apical window. IMPRESSIONS  1. Left ventricular ejection fraction, by estimation, is 40 to 45%. The left ventricle has mildly decreased function. The left ventricle demonstrates global hypokinesis. Left ventricular diastolic function could not be evaluated.  2. Right ventricular systolic function is normal. The right ventricular size is normal.  3. Left atrial size was moderately dilated.  4. Right atrial size was mildly dilated.  5. The mitral valve is normal in structure. Trivial mitral valve regurgitation.  6. The aortic valve is normal in structure. Aortic valve regurgitation is not visualized. Aortic valve sclerosis is present, with no evidence of aortic valve stenosis. FINDINGS  Left Ventricle: Left ventricular ejection fraction, by estimation, is 40 to 45%. The left ventricle has mildly decreased function. The left ventricle demonstrates global hypokinesis. Definity  contrast agent was given IV to delineate the left ventricular  endocardial borders. Strain was performed and the global longitudinal strain is indeterminate. The left ventricular internal cavity size was normal in size. There is no left ventricular hypertrophy. Abnormal (paradoxical) septal motion, consistent  with left bundle branch block. Left ventricular diastolic function could not be evaluated. Right Ventricle: The right ventricular size is normal. No increase in right ventricular wall thickness. Right ventricular systolic function is normal. Left Atrium: Left atrial size was moderately  dilated. Right Atrium: Right atrial size was mildly dilated. Pericardium: There is no evidence of pericardial effusion. Mitral Valve: The mitral valve is normal in structure. There is moderate calcification of the mitral valve leaflet(s). Normal mobility of the mitral valve leaflets. Trivial mitral valve regurgitation. Tricuspid Valve: The tricuspid valve is normal in structure. Tricuspid valve regurgitation is trivial. Aortic Valve: The aortic valve is normal in structure. Aortic valve regurgitation is not visualized. Aortic valve sclerosis is present, with no evidence of aortic valve stenosis. Aortic valve mean gradient measures 8.0 mmHg. Aortic valve peak gradient measures 16.5 mmHg. Aortic valve area, by VTI measures 1.12 cm. Pulmonic Valve: The pulmonic valve was normal in structure. Pulmonic valve regurgitation is not visualized. Aorta: The ascending aorta was not well visualized. IAS/Shunts: No atrial level shunt detected by color flow Doppler. Additional Comments: 3D was performed not requiring image post processing on an independent workstation and was indeterminate.  LEFT VENTRICLE PLAX 2D LVIDd:         4.50 cm LVIDs:         3.60 cm LV PW:         0.90 cm LV IVS:        0.90 cm LVOT diam:     2.00 cm LV SV:         41 LV SV Index:   25 LVOT Area:     3.14 cm LV IVRT:       119 msec  LV Volumes (MOD) LV vol d, MOD A2C: 92.0 ml LV vol d, MOD A4C: 95.4 ml LV vol s, MOD A2C: 57.1 ml LV vol s, MOD A4C: 55.8 ml LV SV MOD A2C:     34.9 ml LV SV MOD A4C:     95.4 ml LV SV MOD BP:      37.8 ml RIGHT VENTRICLE             IVC RV Basal diam:  3.10 cm     IVC diam: 1.10 cm RV S prime:     10.90 cm/s TAPSE (M-mode): 2.0 cm LEFT ATRIUM             Index        RIGHT ATRIUM           Index LA diam:        3.80 cm 2.27 cm/m   RA Area:     14.60 cm LA Vol (A2C):   75.4 ml 45.05 ml/m  RA Volume:   35.30 ml  21.09 ml/m LA Vol (A4C):   51.0 ml 30.47 ml/m LA Biplane Vol: 66.2 ml 39.55 ml/m  AORTIC VALVE AV Area  (Vmax):    1.09 cm AV Area (Vmean):   1.25 cm AV Area (VTI):     1.12 cm AV Vmax:           202.80 cm/s AV Vmean:          127.800 cm/s AV VTI:            0.367 m AV Peak Grad:      16.5 mmHg AV Mean Grad:      8.0 mmHg LVOT Vmax:         70.42 cm/s LVOT Vmean:  50.960 cm/s LVOT VTI:          0.131 m LVOT/AV VTI ratio: 0.36  AORTA Ao Root diam: 2.50 cm Ao Asc diam:  3.00 cm  SHUNTS Systemic VTI:  0.13 m Systemic Diam: 2.00 cm Cara JONETTA Lovelace MD Electronically signed by Cara JONETTA Lovelace MD Signature Date/Time: 12/29/2023/2:34:26 PM    Final      Assessment & Plan: Ms. Tamiah Dysart is a 83 y.o.  female with past medical history of diabetes, hypertension, asthma, CHF and polycythemia vera, who was admitted to [facility] on 12/21/2023 for Chills [R68.83] CAP (community acquired pneumonia) [J18.9] Diabetic ketoacidosis without coma associated with type 2 diabetes mellitus (HCC) [E11.10] Community acquired pneumonia, unspecified laterality [J18.9] Sepsis, due to unspecified organism, unspecified whether acute organ dysfunction present (HCC) [A41.9]  Hyponatremia, likely secondary to pneumonia and volume overload.  No tobacco history.  Primary team has ordered IV furosemide , no improvement thus far with sodium levels.  No immediate intervention.  Continue supportive measures and encourage oral intake.  Would consider fluid restriction for now, also will order Ensure supplementation.  Can continue IV furosemide  40 mg daily.  Will continue to monitor.    LOS: 9 Kelvin Sennett 12/29/20254:48 PM     [1]  Allergies Allergen Reactions   Amoxicillin  Other (See Comments)    Other Reaction(s): Unknown   Doxycycline  Other (See Comments)    Makes her nervous and shaky.   Heparin      Broke out, whelps   Cephalexin Palpitations   Ciprofloxacin Palpitations    Legacy System: Delight    Onset Date: <blank>    Substance Legacy/Cerner: Cipro / Cipro (Legacy value)    Category: Drug    Severity  Legacy/Cerner: <blank> / Unknown    Reaction(s): makes head feel funny  nausea    Comments: <blank>    Legacy System: Frye  Onset Date: <blank>  Substance Legacy/Cerner: Cipro / Cipro (Legacy value)  Category: Drug  Severity Legacy/Cerner: <blank> / Unknown  Reaction(s): makes head feel funny  nausea  Comments: <blank>   Hydrocodone-Acetaminophen  Nausea And Vomiting, Nausea Only and Other (See Comments)   Sulfamethoxazole Palpitations   "

## 2023-12-30 NOTE — Progress Notes (Signed)
 Physical Therapy Treatment Patient Details Name: Jacqueline Arroyo MRN: [id number] DOB: 21-Apr-1940 Today's Date: 12/30/2023   History of Present Illness Pt is a pleasant 83 y.o. female with medical history significant for diabetes, HTN, HLD, asthma, polycythemia vera, HFrEF with last EF was around 35 to 40% from echo in 2025 July, CKD who presented to ED at [facility] for worsening shortness of breath cough and generalized weakness. MD assessment includes:  Possible CAP, asthma exacerbation, metapnemovirus, AKI.    PT Comments  Pt was pleasant and motivated to participate during the session and put forth good effort throughout. Pt required extensive physical assistance with bed mobility tasks and transfers along with cuing for proper sequencing.  Once in standing with L PFRW pt alternated between needing physical assist to prevent LOB and needing CGA only.  Multiple attempts made to advance either LE while in standing with pt unable to do so despite good effort.  Pt's SpO2 and HR both WNL on 2L during the session.  Pt will benefit from continued PT services upon discharge to safely address deficits listed in patient problem list for decreased caregiver assistance and eventual return to PLOF.      If plan is discharge home, recommend the following: Assistance with cooking/housework;Assist for transportation;Two people to help with walking and/or transfers;A lot of help with bathing/dressing/bathroom   Can travel by private vehicle     No  Equipment Recommendations  Other (comment) (TBD at next venue of care)    Recommendations for Other Services       Precautions / Restrictions Precautions Precautions: Fall Recall of Precautions/Restrictions: Intact Precaution/Restrictions Comments: wrist splint for pain Splint/Cast: LUE/ wrist Restrictions Weight Bearing Restrictions Per Provider Order: No     Mobility  Bed Mobility Overal bed mobility: Needs Assistance       Supine to sit: Mod assist,  Max assist Sit to supine: Mod assist, Max assist   General bed mobility comments: Physical assist for BLE and trunk control with cues for sequencing as able    Transfers Overall transfer level: Needs assistance Equipment used: Left platform walker Transfers: Sit to/from Stand Sit to Stand: Mod assist, From elevated surface, Max assist           General transfer comment: Pt able to perform multiple sit to/from stands from an elevated EOB with heavy assist to come to and to remain in standing; mod to max lean on the PRW for support once in standing    Ambulation/Gait               General Gait Details: Multiple attempts made to advance either LE with pt unable to do so   Stairs             Wheelchair Mobility     Tilt Bed    Modified Rankin (Stroke Patients Only)       Balance Overall balance assessment: Needs assistance Sitting-balance support: Feet supported, Single extremity supported Sitting balance-Leahy Scale: Fair     Standing balance support: Bilateral upper extremity supported, During functional activity Standing balance-Leahy Scale: Poor                              Communication Communication Communication: No apparent difficulties  Cognition Arousal: Alert Behavior During Therapy: WFL for tasks assessed/performed   PT - Cognitive impairments: No apparent impairments  Following commands: Intact      Cueing Cueing Techniques: Verbal cues, Tactile cues  Exercises Other Exercises Other Exercises: Static sitting at EOB and standing with PRW for improved stability, strength, and activity tolerance    General Comments        Pertinent Vitals/Pain Pain Assessment Pain Assessment: 0-10 Pain Score: 6  Pain Location: bilateral ankles and L wrist Pain Descriptors / Indicators: Aching, Sore Pain Intervention(s): Monitored during session, Repositioned, Premedicated before session    Home  Living                          Prior Function            PT Goals (current goals can now be found in the care plan section) Progress towards PT goals: PT to reassess next treatment    Frequency    Min 2X/week      PT Plan      Co-evaluation              AM-PAC PT 6 Clicks Mobility   Outcome Measure  Help needed turning from your back to your side while in a flat bed without using bedrails?: A Lot Help needed moving from lying on your back to sitting on the side of a flat bed without using bedrails?: A Lot Help needed moving to and from a bed to a chair (including a wheelchair)?: A Lot Help needed standing up from a chair using your arms (e.g., wheelchair or bedside chair)?: A Lot Help needed to walk in hospital room?: Total Help needed climbing 3-5 steps with a railing? : Total 6 Click Score: 10    End of Session Equipment Utilized During Treatment: Oxygen;Gait belt Activity Tolerance: Patient tolerated treatment well Patient left: in bed;with call bell/phone within reach;with nursing/sitter in room (CNA in room assisting with hygiene at end of session) Nurse Communication: Mobility status PT Visit Diagnosis: Difficulty in walking, not elsewhere classified (R26.2);Muscle weakness (generalized) (M62.81);Pain Pain - Right/Left: Left Pain - part of body: Ankle and joints of foot;Hand     Time: 1515-1540 PT Time Calculation (min) (ACUTE ONLY): 25 min  Charges:    $Therapeutic Activity: 23-37 mins PT General Charges $$ ACUTE PT VISIT: 1 Visit                    D. Scott Madelline Eshbach PT, DPT 12/30/2023, 3:51 PM

## 2023-12-30 NOTE — TOC Progression Note (Signed)
 Transition of Care [facility]) - Progression Note    Patient Details  Name: Jacqueline Arroyo MRN: [id number] Date of Birth: [birth year]  Transition [facility] Care [facility]) CM/SW Contact  Alfonso Rummer, LCSW Phone Number: 12/30/2023, 5:26 PM  Clinical Narrative:     [facility] form updated. Pt will transition to [facility] once medically ready for discharge.   Expected Discharge Plan: Home w Home Health Services Barriers to Discharge: Continued Medical Work up               Expected Discharge Plan and Services In-house Referral: Clinical Social Work Discharge Planning Services: CM Consult   Living arrangements for the past 2 months: Apartment                                       Social Drivers of Health (SDOH) Interventions SDOH Screenings   Food Insecurity: Patient Declined (12/22/2023)  Housing: Patient Declined (12/22/2023)  Transportation Needs: Patient Declined (12/22/2023)  Utilities: Patient Declined (12/22/2023)  Financial Resource Strain: Low Risk (12/16/2023)   Received from [facility]  Physical Activity: Insufficiently Active (06/20/2023)   Received from [facility]  Social Connections: Patient Declined (12/22/2023)  Stress: No Stress Concern Present (06/20/2023)   Received from [facility]  Tobacco Use: Low Risk (12/21/2023)  Health Literacy: Low Risk (06/20/2023)   Received from [facility]    Readmission Risk Interventions     No data to display

## 2023-12-31 ENCOUNTER — Inpatient Hospital Stay

## 2023-12-31 DIAGNOSIS — J189 Pneumonia, unspecified organism: Secondary | ICD-10-CM | POA: Diagnosis not present

## 2023-12-31 DIAGNOSIS — D45 Polycythemia vera: Secondary | ICD-10-CM | POA: Diagnosis not present

## 2023-12-31 LAB — BASIC METABOLIC PANEL WITH GFR
Anion gap: 14 (ref 5–15)
BUN: 56 mg/dL — ABNORMAL HIGH (ref 8–23)
CO2: 21 mmol/L — ABNORMAL LOW (ref 22–32)
Calcium: 8.8 mg/dL — ABNORMAL LOW (ref 8.9–10.3)
Chloride: 93 mmol/L — ABNORMAL LOW (ref 98–111)
Creatinine, Ser: 1.2 mg/dL — ABNORMAL HIGH (ref 0.44–1.00)
GFR, Estimated: 44 mL/min — ABNORMAL LOW
Glucose, Bld: 162 mg/dL — ABNORMAL HIGH (ref 70–99)
Potassium: 4.6 mmol/L (ref 3.5–5.1)
Sodium: 128 mmol/L — ABNORMAL LOW (ref 135–145)

## 2023-12-31 LAB — GLUCOSE, CAPILLARY
Glucose-Capillary: 143 mg/dL — ABNORMAL HIGH (ref 70–99)
Glucose-Capillary: 225 mg/dL — ABNORMAL HIGH (ref 70–99)
Glucose-Capillary: 226 mg/dL — ABNORMAL HIGH (ref 70–99)
Glucose-Capillary: 229 mg/dL — ABNORMAL HIGH (ref 70–99)

## 2023-12-31 MED ORDER — ADULT MULTIVITAMIN W/MINERALS CH
1.0000 | ORAL_TABLET | Freq: Every day | ORAL | Status: DC
  Administered 2024-01-01 – 2024-01-15 (×15): 1 via ORAL
  Filled 2023-12-31 (×15): qty 1

## 2023-12-31 MED ORDER — FUROSEMIDE 10 MG/ML IJ SOLN
20.0000 mg | Freq: Every day | INTRAMUSCULAR | Status: DC
  Administered 2024-01-01 – 2024-01-02 (×2): 20 mg via INTRAVENOUS
  Filled 2023-12-31 (×2): qty 2

## 2023-12-31 MED ORDER — HYDROCORTISONE 1 % EX CREA
TOPICAL_CREAM | Freq: Two times a day (BID) | CUTANEOUS | Status: AC
  Filled 2023-12-31: qty 28

## 2023-12-31 MED ORDER — THIAMINE HCL 100 MG PO TABS
100.0000 mg | ORAL_TABLET | Freq: Every day | ORAL | Status: AC
  Administered 2024-01-01 – 2024-01-07 (×7): 100 mg via ORAL
  Filled 2023-12-31 (×13): qty 1

## 2023-12-31 NOTE — Progress Notes (Signed)
 " [facility]  ROUNDING NOTE   Subjective:   Patient seen resting in bed Alert and oriented Son at bedside Appetite minimally improved  Sodium 128  Objective:  Vital signs in last 24 hours:  Temp:  [97.5 F (36.4 C)-97.9 F (36.6 C)] 97.8 F (36.6 C) (12/30 1536) Pulse Rate:  [85-111] 111 (12/30 1536) Resp:  [15-20] 18 (12/30 0424) BP: (94-118)/(50-66) 118/66 (12/30 1536) SpO2:  [90 %-99 %] 90 % (12/30 1536) Weight:  [75.7 kg] 75.7 kg (12/30 0424)  Weight change:  Filed Weights   12/21/23 0026 12/26/23 0500 12/31/23 0424  Weight: 90.6 kg 70.2 kg 75.7 kg    Intake/Output: I/O last 3 completed shifts: In: 0  Out: 250 [Urine:250]   Intake/Output this shift:  No intake/output data recorded.  Physical Exam: General: NAD  Head: Normocephalic, atraumatic. Moist oral mucosal membranes  Eyes: Anicteric  Lungs:  Clear to auscultation, normal effort  Heart: Regular rate and rhythm  Abdomen:  Soft, nontender  Extremities:  No peripheral edema.  Neurologic: Awake, alert, conversant  Skin: Warm,dry, no rash       Basic Metabolic Panel: Recent Labs  Lab 12/26/23 0346 12/27/23 0405 12/28/23 0335 12/29/23 0349 12/30/23 0433 12/31/23 0358  NA 131* 127* 127* 126* 126* 128*  K 4.0 4.5 4.2 4.3 4.4 4.6  CL 95* 93* 93* 93* 91* 93*  CO2 21* 20* 21* 21* 20* 21*  GLUCOSE 237* 279* 186* 180* 183* 162*  BUN 34* 44* 45* 43* 48* 56*  CREATININE 1.16* 1.29* 1.18* 1.09* 1.18* 1.20*  CALCIUM  8.9 8.8* 8.6* 9.0 8.9 8.8*  MG 1.9 1.8  --   --   --   --     Liver Function Tests: No results for input(s): AST, ALT, ALKPHOS, BILITOT, PROT, ALBUMIN  in the last 168 hours. No results for input(s): LIPASE, AMYLASE in the last 168 hours. No results for input(s): AMMONIA in the last 168 hours.  CBC: Recent Labs  Lab 12/26/23 0346 12/28/23 0335 12/30/23 0433  WBC 8.8 9.0 9.8  HGB 11.4* 10.3* 9.8*  HCT 34.6* 31.6* 30.4*  MCV 107.8* 108.2* 106.7*  PLT  229 219 225    Cardiac Enzymes: No results for input(s): CKTOTAL, CKMB, CKMBINDEX, TROPONINI in the last 168 hours.  BNP: Invalid input(s): POCBNP  CBG: Recent Labs  Lab 12/30/23 1148 12/30/23 1618 12/30/23 2153 12/31/23 0743 12/31/23 1228  GLUCAP 228* 187* 190* 143* 225*    Microbiology: Results for orders placed or performed during the hospital encounter of 12/21/23  Resp panel by RT-PCR (RSV, Flu A&B, Covid) Anterior Nasal Swab     Status: None   Collection Time: 12/21/23 12:33 AM   Specimen: Anterior Nasal Swab  Result Value Ref Range Status   SARS Coronavirus 2 by RT PCR NEGATIVE NEGATIVE Final    Comment: (NOTE) SARS-CoV-2 target nucleic acids are NOT DETECTED.  The SARS-CoV-2 RNA is generally detectable in upper respiratory specimens during the acute phase of infection. The lowest concentration of SARS-CoV-2 viral copies this assay can detect is 138 copies/mL. A negative result does not preclude SARS-Cov-2 infection and should not be used as the sole basis for treatment or other patient management decisions. A negative result may occur with  improper specimen collection/handling, submission of specimen other than nasopharyngeal swab, presence of viral mutation(s) within the areas targeted by this assay, and inadequate number of viral copies(<138 copies/mL). A negative result must be combined with clinical observations, patient history, and epidemiological information. The expected  result is Negative.  Fact Sheet for Patients:  bloggercourse.com  Fact Sheet for Healthcare Providers:  seriousbroker.it  This test is no t yet approved or cleared by the [place]  FDA and  has been authorized for detection and/or diagnosis of SARS-CoV-2 by FDA under an Emergency Use Authorization (EUA). This EUA will remain  in effect (meaning this test can be used) for the duration of the COVID-19 declaration under  Section 564(b)(1) of the Act, 21 U.S.C.section 360bbb-3(b)(1), unless the authorization is terminated  or revoked sooner.       Influenza A by PCR NEGATIVE NEGATIVE Final   Influenza B by PCR NEGATIVE NEGATIVE Final    Comment: (NOTE) The Xpert Xpress SARS-CoV-2/FLU/RSV plus assay is intended as an aid in the diagnosis of influenza from Nasopharyngeal swab specimens and should not be used as a sole basis for treatment. Nasal washings and aspirates are unacceptable for Xpert Xpress SARS-CoV-2/FLU/RSV testing.  Fact Sheet for Patients: bloggercourse.com  Fact Sheet for Healthcare Providers: seriousbroker.it  This test is not yet approved or cleared by the [place]  FDA and has been authorized for detection and/or diagnosis of SARS-CoV-2 by FDA under an Emergency Use Authorization (EUA). This EUA will remain in effect (meaning this test can be used) for the duration of the COVID-19 declaration under Section 564(b)(1) of the Act, 21 U.S.C. section 360bbb-3(b)(1), unless the authorization is terminated or revoked.     Resp Syncytial Virus by PCR NEGATIVE NEGATIVE Final    Comment: (NOTE) Fact Sheet for Patients: bloggercourse.com  Fact Sheet for Healthcare Providers: seriousbroker.it  This test is not yet approved or cleared by the [place]  FDA and has been authorized for detection and/or diagnosis of SARS-CoV-2 by FDA under an Emergency Use Authorization (EUA). This EUA will remain in effect (meaning this test can be used) for the duration of the COVID-19 declaration under Section 564(b)(1) of the Act, 21 U.S.C. section 360bbb-3(b)(1), unless the authorization is terminated or revoked.  Performed at [facility], [address] [place], [place] [address]   Culture, blood (x 2)     Status: None   Collection Time: 12/21/23  6:52 AM   Specimen: BLOOD   Result Value Ref Range Status   Specimen Description BLOOD LEFT ANTECUBITAL  Final   Special Requests   Final    BOTTLES DRAWN AEROBIC AND ANAEROBIC Blood Culture adequate volume   Culture   Final    NO GROWTH 5 DAYS Performed at [facility], [address], [place], [place] [address]    Report Status 12/26/2023 FINAL  Final  Culture, blood (x 2)     Status: None   Collection Time: 12/21/23  7:10 AM   Specimen: BLOOD  Result Value Ref Range Status   Specimen Description BLOOD BLOOD RIGHT ARM  Final   Special Requests   Final    BOTTLES DRAWN AEROBIC AND ANAEROBIC Blood Culture adequate volume   Culture   Final    NO GROWTH 5 DAYS Performed at [facility], [address], [place], [place] [address]    Report Status 12/26/2023 FINAL  Final    Coagulation Studies: No results for input(s): LABPROT, INR in the last 72 hours.  Urinalysis: No results for input(s): COLORURINE, LABSPEC, PHURINE, GLUCOSEU, HGBUR, BILIRUBINUR, KETONESUR, PROTEINUR, UROBILINOGEN, NITRITE, LEUKOCYTESUR in the last 72 hours.  Invalid input(s): APPERANCEUR    Imaging: No results found.   Medications:     apixaban   10 mg Oral BID  Followed by   NOREEN ON 01/05/2024] apixaban   5 mg Oral BID   aspirin  EC  81 mg Oral Daily   atorvastatin   10 mg Oral Daily   cycloSPORINE   1 drop Both Eyes BID   feeding supplement  237 mL Oral BID BM   fluticasone  furoate-vilanterol  1 puff Inhalation Daily   [START ON 01/01/2024] furosemide   20 mg Intravenous Daily   insulin  aspart  0-5 Units Subcutaneous QHS   insulin  aspart  0-9 Units Subcutaneous TID WC   insulin  aspart  5 Units Subcutaneous TID WC   insulin  glargine  7 Units Subcutaneous Daily   lidocaine   1 patch Transdermal Q24H   LORazepam   0.25 mg Oral BID   metoprolol  succinate  50 mg Oral QHS   [START ON 01/01/2024] multivitamin with minerals  1 tablet Oral Daily   pantoprazole   40 mg Oral BID AC    [START ON 01/01/2024] thiamine  100 mg Oral Daily   acetaminophen  **OR** acetaminophen , bisacodyl , diclofenac  Sodium, guaiFENesin , ipratropium-albuterol , ondansetron  **OR** ondansetron  (ZOFRAN ) IV, polyethylene glycol, traMADol   Assessment/ Plan:  Ms. Jacqueline Arroyo is a 83 y.o.  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 No tobacco history. Recent 2D echo shows LVEF 40 to 45% with mildly decreased LV function. Global hypokinesis Primary team has ordered IV furosemide , no improvement thus far with sodium levels.  Sodium has improved to 128. Continue supportive measures and encourage oral intake. Continue Ensure supplementation. Will decrease to IV furosemide  20 mg daily. Will continue to monitor.     LOS: 10 Jacqueline Arroyo 12/30/20253:48 PM   "

## 2023-12-31 NOTE — Progress Notes (Signed)
 Initial Nutrition Assessment  DOCUMENTATION CODES:   Obesity unspecified  INTERVENTION:   Ensure Plus High Protein po BID, each supplement provides 350 kcal and 20 grams of protein  Magic cup TID with meals, each supplement provides 290 kcal and 9 grams of protein  MVI po daily   Thiamine 100mg  po daily x 7 days   Pt at high refeed risk; recommend monitor potassium, magnesium and phosphorus labs daily until stable  Daily weights   NUTRITION DIAGNOSIS:   Inadequate oral intake related to acute illness as evidenced by meal completion < 25%.  GOAL:   Patient will meet greater than or equal to 90% of their needs  MONITOR:   PO intake, Supplement acceptance, Labs, Weight trends, Skin, I & O's  REASON FOR ASSESSMENT:   Consult Assessment of nutrition requirement/status  ASSESSMENT:   83 y/o female with h/o CHF, asthma, HTN, HLD, DM, Afib, CKD, polycythemia and recent admission to [facility] for asthma exacerbation and who is now admitted with community-acquired pneumonia, asthma exacerbation, metapneumovirus infection, new DVT and AKI.  RD working remotely.  Pt with decreased appetite and oral intake in hospital. Spoke with RN, pt ate 25% of some mashed potatoes and pot roast for lunch today. Pt currently working on drinking and Ensure. RD will add supplements and vitamins to help pt meet her estimated needs. Pt is at high refeed risk. Per chart, pt is down 11lbs(6%) over the past two weeks; this is significant weight loss. RD will follow up to obtain history and exam.   Medications reviewed and include: aspirin , lasix , insulin , protonix   Labs reviewed: Na 128(L), K 4.6 wnl, BUN 56(H), creat 1.20(H) Hgb 9.8(L), Hct 30.4(L), MCV 106.7(H), MCH 34.4(H) Cbgs- 225, 143 x 24 hrs  AIC 7.8(H)- 12/20  UOP-   NUTRITION - FOCUSED PHYSICAL EXAM: Unable to perform at this time   Diet Order:   Diet Order             Diet Carb Modified Room service appropriate? Yes; Fluid  restriction: 1500 mL Fluid  Diet effective now                  EDUCATION NEEDS:   No education needs have been identified at this time  Skin:  Skin Assessment: Reviewed RN Assessment  Last BM:  12/30- TYPE 6  Height:   Ht Readings from Last 1 Encounters:  12/29/23 5' (1.524 m)    Weight:   Wt Readings from Last 1 Encounters:  12/31/23 75.7 kg    Ideal Body Weight:  45.45 kg  BMI:  Body mass index is 32.59 kg/m.  Estimated Nutritional Needs:   Kcal:  1500-1700kcal/day  Protein:  75-85g/day  Fluid:  1.3-1.5L/day  Augustin Shams MS, RD, LDN If unable to be reached, please send secure chat to RD inpatient available from 8:00a-4:00p daily

## 2023-12-31 NOTE — Plan of Care (Signed)
   Problem: Fluid Volume: Goal: Hemodynamic stability will improve Outcome: Progressing   Problem: Clinical Measurements: Goal: Diagnostic test results will improve Outcome: Progressing Goal: Signs and symptoms of infection will decrease Outcome: Progressing

## 2023-12-31 NOTE — TOC Progression Note (Signed)
 Transition of Care [facility]) - Progression Note    Patient Details  Name: Jacqueline Arroyo MRN: [id number] Date of Birth: March 27, 1940  Transition of Care [facility]) CM/SW Contact  [name], RN Phone Number: 12/31/2023, 4:36 PM  Clinical Narrative:    Readmission risk assessment complete. Per MD, patient not medically ready for discharge.   Expected Discharge Plan: Home w Home Health Services Barriers to Discharge: Continued Medical Work up               Expected Discharge Plan and Services In-house Referral: Clinical Social Work Discharge Planning Services: CM Consult   Living arrangements for the past 2 months: Apartment                                       Social Drivers of Health (SDOH) Interventions SDOH Screenings   Food Insecurity: Patient Declined (12/22/2023)  Housing: Patient Declined (12/22/2023)  Transportation Needs: Patient Declined (12/22/2023)  Utilities: Patient Declined (12/22/2023)  Financial Resource Strain: Low Risk (12/16/2023)   Received from [facility]  Physical Activity: Insufficiently Active (06/20/2023)   Received from [facility]  Social Connections: Patient Declined (12/22/2023)  Stress: No Stress Concern Present (06/20/2023)   Received from [facility]  Tobacco Use: Low Risk (12/21/2023)  Health Literacy: Low Risk (06/20/2023)   Received from [facility]    Readmission Risk Interventions    12/31/2023    4:35 PM  Readmission Risk Prevention Plan  Transportation Screening Complete  PCP or Specialist Appt within 5-7 Days Complete  Home Care Screening Complete  Medication Review (RN CM) Complete

## 2023-12-31 NOTE — Progress Notes (Signed)
 " Progress Note   Patient: Jacqueline Arroyo FMW:[id number] DOB: 1940/02/09 DOA: 12/21/2023     10 DOS: the patient was seen and examined on 12/31/2023   Brief hospital course: Jacqueline Arroyo is a pleasant 83 y.o. female with medical history significant for mild on surveillance, diabetes, HTN, HLD, asthma, polycythemia vera, HFrEF with last EF was around 35 to 40% from echo in 2025 July, CKD who presented to ED at [facility] for worsening shortness of breath cough and generalized weakness.  Patient had a recent hospitalization at [facility] and was discharged from the hospital on 12/16/2023 after being diagnosed with bronchitis and exacerbation of asthma.  She presented on 12/19  with worsening respiratory symptoms after she went home along with some chills, generalized weakness and worsening nausea.  Admitted for possible community-acquired pneumonia, asthma exacerbation, metapneumovirus infection.  Hospital course as below  Assessment and Plan:  Possible CAP  Asthma exacerbation  Metapnemovirus + Chronic hypoxic respiratory failure - 2L [place] Patient recently treated at OSH for asthma exacerbation and viral bronchitis (metapneumovirus +). She was discharged with albuterol  and steroid course Presented with worsening SOB, elevated D-dimer, VQ neg for PE. CXR with small opacity adjacent to elevated diaphragm.  Completed abx course, prednisone  x 5 days Home inhalers, DuoNebs as needed  Left popliteal vein DVT Suspect due to immobility and allergic to anticoagulants elevated D-dimer, VQ neg for PE US  lower extremity shows nonocclusive acute DVT in left popliteal vein On Eliquis  10 mg twice daily, is allergic to heparin /Lovenox   Acute on chronic HFrEF Echo shows EF 40 to 45%, LV global hypokinesis.  LA moderately dilated, trivial MR. 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 Completed prednisone  Lantus  7u at night  H/o Polycythemia vera  Hgb stable. Noted to be 12-13 during recent hospitalization. No obvious bleeding noted, denies active bleeding/melena Iron panel consistent with ACD Hold home hydroxyurea  PPI BID  HTN/HLD Hold home amlodipine , losartan given low normal blood pressures   Left ankle osteoarthritis Has chronic pain XR ankle - no fracture Change oxycodone  to Tramadol , Topical diclofenac  gel  Left wrist pain Reports hurt her Left wrist while trying to get out of bed XR Left wrist no acute fracture Pain management as above, wrist splint - X-ray right shoulder pending -complain of pain today  Constipation And bowel regimen   PT recommended SNF, initially refused but now would like to [facility]    Subjective: Patient examined at the bedside, family not present at the bedside Reports slightly feeling better today, Sodium and volume status improving with Lasix . Reports right-sided shoulder pain and difficulty with ROM, will get x-ray right shoulder   Physical Exam: Vitals:   12/31/23 0424 12/31/23 0744 12/31/23 1227 12/31/23 1536  BP: (!) 114/52 (!) 115/51 (!) 103/50 118/66  Pulse: 88 92 95 (!) 111  Resp: 18     Temp: 97.9 F (36.6 C) 97.6 F (36.4 C) 97.7 F (36.5 C) 97.8 F (36.6 C)  TempSrc:  Oral Oral Oral  SpO2: 90% 91% 99% 90%  Weight: 75.7 kg     Height:       Physical Exam  Constitutional: In no distress.  Cardiovascular: systolic murmur. Normal rate, regular rhythm. No lower extremity edema  Pulmonary: Non labored breathing on [place], no wheezing Abdominal: Soft. Non distended and non tender Musculoskeletal: Normal range of motion.     Neurological: Alert and  oriented to person, place, and time. Non focal  Skin: Skin is warm and dry.   Data Reviewed:     Latest Ref Rng & Units 12/31/2023    3:58 AM 12/30/2023    4:33 AM 12/29/2023    3:49 AM  BMP  Glucose 70 - 99 mg/dL 837  816  819   BUN 8 - 23 mg/dL 56  48  43   Creatinine 0.44 - 1.00 mg/dL 8.79  8.81  8.90   Sodium 135 - 145 mmol/L 128  126  126   Potassium 3.5 - 5.1 mmol/L 4.6  4.4  4.3   Chloride 98 - 111 mmol/L 93  91  93   CO2 22 - 32 mmol/L 21  20  21    Calcium  8.9 - 10.3 mg/dL 8.8  8.9  9.0       Latest Ref Rng & Units 12/30/2023    4:33 AM 12/28/2023    3:35 AM 12/26/2023    3:46 AM  CBC  WBC 4.0 - 10.5 K/uL 9.8  9.0  8.8   Hemoglobin 12.0 - 15.0 g/dL 9.8  89.6  88.5   Hematocrit 36.0 - 46.0 % 30.4  31.6  34.6   Platelets 150 - 400 K/uL 225  219  229      Family Communication: None at bedside   Disposition: Status is: Inpatient Remains inpatient appropriate because: treatment of dyspnea.   Planned Discharge Destination: Home    Time spent: 56 minutes  Author: Laree Lock, MD 12/31/2023 7:23 PM  For on call review [email or web address].  "

## 2023-12-31 NOTE — Progress Notes (Signed)
 Heart Failure Navigator Progress Note  Assessed for Heart & Vascular TOC clinic readiness.  Patient does not meet criteria due to current [facility] patient. Family shared she will follow-up with [facility] following her discharge from [facility].    Navigator will sign of fat this time.  Charmaine Pines, RN, BSN [facility] Heart Failure Navigator Secure Chat Only

## 2023-12-31 NOTE — Progress Notes (Signed)
 Physical Therapy Treatment Patient Details Name: Jacqueline Arroyo MRN: [id number] DOB: 12-14-1940 Today's Date: 12/31/2023   History of Present Illness Pt is a pleasant 83 y.o. female with medical history significant for diabetes, HTN, HLD, asthma, polycythemia vera, HFrEF with last EF was around 35 to 40% from echo in 2025 July, CKD who presented to ED at [facility] for worsening shortness of breath cough and generalized weakness. MD assessment includes:  Possible CAP, asthma exacerbation, metapnemovirus, AKI.    PT Comments  Pt was pleasant and motivated to participate during the session and put forth good effort throughout. Pt required +2 assistance with bed mobility tasks for BLE and trunk control as well as for safety with transfer training.  Once in standing pt was able to take several effortful, shuffling steps near the EOB and then to the recliner with min A for stability and to guide the RW.  Pt reported feeling generally less pain with weight bearing activity this session compared to prior sessions.  Pt remains at an elevated risk for falls and will benefit from continued PT services upon discharge to safely address deficits listed in patient problem list for decreased caregiver assistance and eventual return to PLOF.      If plan is discharge home, recommend the following: Assistance with cooking/housework;Assist for transportation;Two people to help with walking and/or transfers;A lot of help with bathing/dressing/bathroom   Can travel by private vehicle     No  Equipment Recommendations  Other (comment) (TBD at next venue of care)    Recommendations for Other Services       Precautions / Restrictions Precautions Precautions: Fall Recall of Precautions/Restrictions: Intact Precaution/Restrictions Comments: L wrist splint for pain control Required Braces or Orthoses: Splint/Cast Splint/Cast: LUE/ wrist Restrictions Weight Bearing Restrictions Per Provider Order: No     Mobility   Bed Mobility Overal bed mobility: Needs Assistance       Supine to sit: Mod assist, +2 for physical assistance     General bed mobility comments: +2 Mod A for BLE and trunk control    Transfers Overall transfer level: Needs assistance Equipment used: Left platform walker Transfers: Sit to/from Stand Sit to Stand: Min assist, +2 safety/equipment           General transfer comment: Pt able to perform multiple sit to/from stand transfers with +2 min A and cues for increased trunk flexion    Ambulation/Gait Ambulation/Gait assistance: Min assist Gait Distance (Feet): 3 Feet Assistive device: Left platform walker Gait Pattern/deviations: Antalgic, Decreased step length - right, Step-through pattern, Decreased step length - left, Shuffle, Trunk flexed Gait velocity: decreased     General Gait Details: Pt able to take several small steps near the EOB and from the bed to the recliner with min A for stability and to help guide the RW   Stairs             Wheelchair Mobility     Tilt Bed    Modified Rankin (Stroke Patients Only)       Balance Overall balance assessment: Needs assistance Sitting-balance support: Feet supported, Single extremity supported Sitting balance-Leahy Scale: Fair Sitting balance - Comments: posterior bias in sitting but pt able to self-correct with cuing   Standing balance support: Bilateral upper extremity supported, During functional activity, Reliant on assistive device for balance Standing balance-Leahy Scale: Poor  Communication Communication Communication: No apparent difficulties  Cognition Arousal: Alert Behavior During Therapy: WFL for tasks assessed/performed   PT - Cognitive impairments: No apparent impairments                                Cueing Cueing Techniques: Verbal cues, Tactile cues  Exercises      General Comments        Pertinent Vitals/Pain Pain  Assessment Pain Assessment: 0-10 Pain Score: 6  Pain Location: bilateral ankles, R shouler, and L wrist Pain Descriptors / Indicators: Aching, Sore Pain Intervention(s): Premedicated before session, Monitored during session, Repositioned    Home Living                          Prior Function            PT Goals (current goals can now be found in the care plan section) Progress towards PT goals: Progressing toward goals    Frequency    Min 2X/week      PT Plan      Co-evaluation              AM-PAC PT 6 Clicks Mobility   Outcome Measure  Help needed turning from your back to your side while in a flat bed without using bedrails?: A Lot Help needed moving from lying on your back to sitting on the side of a flat bed without using bedrails?: Total Help needed moving to and from a bed to a chair (including a wheelchair)?: A Lot Help needed standing up from a chair using your arms (e.g., wheelchair or bedside chair)?: A Lot Help needed to walk in hospital room?: Total Help needed climbing 3-5 steps with a railing? : Total 6 Click Score: 9    End of Session Equipment Utilized During Treatment: Oxygen;Gait belt Activity Tolerance: Patient tolerated treatment well Patient left: in bed;with call bell/phone within reach;with nursing/sitter in room Nurse Communication: Mobility status PT Visit Diagnosis: Difficulty in walking, not elsewhere classified (R26.2);Muscle weakness (generalized) (M62.81);Pain Pain - Right/Left:  (bilateral) Pain - part of body:  (ankles, L wrist, R shoulder)     Time: 8482-8464 PT Time Calculation (min) (ACUTE ONLY): 18 min  Charges:    $Gait Training: 8-22 mins PT General Charges $$ ACUTE PT VISIT: 1 Visit                    D. Scott Derrill Bagnell PT, DPT 12/31/2023, 3:46 PM

## 2024-01-01 DIAGNOSIS — J189 Pneumonia, unspecified organism: Secondary | ICD-10-CM | POA: Diagnosis not present

## 2024-01-01 LAB — BASIC METABOLIC PANEL WITH GFR
Anion gap: 12 (ref 5–15)
BUN: 49 mg/dL — ABNORMAL HIGH (ref 8–23)
CO2: 22 mmol/L (ref 22–32)
Calcium: 8.6 mg/dL — ABNORMAL LOW (ref 8.9–10.3)
Chloride: 94 mmol/L — ABNORMAL LOW (ref 98–111)
Creatinine, Ser: 1.08 mg/dL — ABNORMAL HIGH (ref 0.44–1.00)
GFR, Estimated: 50 mL/min — ABNORMAL LOW
Glucose, Bld: 159 mg/dL — ABNORMAL HIGH (ref 70–99)
Potassium: 3.9 mmol/L (ref 3.5–5.1)
Sodium: 128 mmol/L — ABNORMAL LOW (ref 135–145)

## 2024-01-01 LAB — PHOSPHORUS: Phosphorus: 4.9 mg/dL — ABNORMAL HIGH (ref 2.5–4.6)

## 2024-01-01 LAB — MAGNESIUM: Magnesium: 2.1 mg/dL (ref 1.7–2.4)

## 2024-01-01 LAB — GLUCOSE, CAPILLARY
Glucose-Capillary: 161 mg/dL — ABNORMAL HIGH (ref 70–99)
Glucose-Capillary: 268 mg/dL — ABNORMAL HIGH (ref 70–99)
Glucose-Capillary: 244 mg/dL — ABNORMAL HIGH (ref 70–99)
Glucose-Capillary: 240 mg/dL — ABNORMAL HIGH (ref 70–99)

## 2024-01-01 LAB — CBC
HCT: 29.5 % — ABNORMAL LOW (ref 36.0–46.0)
Hemoglobin: 9.6 g/dL — ABNORMAL LOW (ref 12.0–15.0)
MCH: 34.9 pg — ABNORMAL HIGH (ref 26.0–34.0)
MCHC: 32.5 g/dL (ref 30.0–36.0)
MCV: 107.3 fL — ABNORMAL HIGH (ref 80.0–100.0)
Platelets: 276 K/uL (ref 150–400)
RBC: 2.75 MIL/uL — ABNORMAL LOW (ref 3.87–5.11)
RDW: 13.2 % (ref 11.5–15.5)
WBC: 8.3 K/uL (ref 4.0–10.5)
nRBC: 0 % (ref 0.0–0.2)

## 2024-01-01 NOTE — Progress Notes (Signed)
 "    Progress Note    Jacqueline Arroyo  FMW:[id number] DOB: [birth year]  DOA: 12/21/2023 PCP: [facility]      Brief Narrative:    Medical records reviewed and are as summarized below:  Jacqueline Arroyo is a 83 y.o. female  with medical history significant for mild on surveillance, diabetes, HTN, HLD, asthma, polycythemia vera, HFrEF with last EF was around 35 to 40% from echo in 2025 July, CKD who presented to ED at [facility] for worsening shortness of breath cough and generalized weakness.  Patient had a recent hospitalization at [facility] and was discharged from the hospital on 12/16/2023 after being diagnosed with bronchitis and exacerbation of asthma.  She presented on 12/19  with worsening respiratory symptoms after she went home along with some chills, generalized weakness and worsening nausea.  Admitted for possible community-acquired pneumonia, asthma exacerbation, metapneumovirus infection.       Assessment/Plan:   Principal Problem:   CAP (community acquired pneumonia) Active Problems:   HFrEF (heart failure with reduced ejection fraction) (HCC)   HTN (hypertension)   HLD (hyperlipidemia)   DM (diabetes mellitus) (HCC)   Asthma, chronic   Polycythemia vera (HCC)   Nutrition Problem: Inadequate oral intake Etiology: acute illness  Signs/Symptoms: meal completion < 25%   Body mass index is 31.09 kg/m.   Possible CAP  Asthma exacerbation  Metapnemovirus + Chronic hypoxic respiratory failure - 2L [place] Patient recently treated at [facility] for asthma exacerbation and viral bronchitis (metapneumovirus +). She was discharged with albuterol  and steroid course Presented with worsening SOB, elevated D-dimer, VQ neg for PE. CXR with small opacity adjacent to elevated diaphragm.  Completed abx course, prednisone  x 5 days Home inhalers, DuoNebs as needed    Left popliteal vein DVT Suspect due to immobility elevated D-dimer, VQ neg for PE US  lower extremity shows  nonocclusive acute DVT in left popliteal vein On Eliquis  10 mg twice daily, is allergic to heparin /Lovenox     Acute on chronic HFrEF Echo shows EF 40 to 45%, LV global hypokinesis.  LA moderately dilated, trivial MR. She remains on IV Lasix . 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  Unclear etiology Follow-up with nephrologist. Continue fluid restriction and Lasix .    T2DM  Steroid-induced hyperglycemia HbA1c 7.8 On NovoLog  5 units tid, SSI. Completed prednisone  Lantus  7u at night    H/o Polycythemia vera  Hgb stable. Noted to be 12-13 during recent hospitalization. No obvious bleeding noted, denies active bleeding/melena Iron panel consistent with ACD Hold home hydroxyurea  PPI BID   Left ankle osteoarthritis Has chronic pain XR ankle - no fracture Change oxycodone  to Tramadol , Topical diclofenac  gel   Left wrist pain, left shoulder pain XR Left wrist no acute fracture History right shoulder showed mild acromioclavicular degenerative change, mild subcortical cystic change in the lateral humeral head. Analgesics as needed for pain.    Constipation Laxatives as needed     Comorbidities include hypertension, hyperlipidemia      Diet Order             Diet Carb Modified Room service appropriate? Yes; Fluid restriction: 1500 mL Fluid  Diet effective now  Consultants: Nephrologist  Procedures: None    Medications:    apixaban   10 mg Oral BID   Followed by   NOREEN ON 01/05/2024] apixaban   5 mg Oral BID   aspirin  EC  81 mg Oral Daily   atorvastatin   10 mg Oral Daily   cycloSPORINE   1 drop Both Eyes BID   feeding supplement  237 mL Oral BID BM   fluticasone  furoate-vilanterol  1 puff Inhalation Daily   furosemide   20 mg Intravenous Daily   hydrocortisone  cream   Topical BID   insulin  aspart  0-5 Units Subcutaneous QHS   insulin  aspart  0-9 Units Subcutaneous TID WC   insulin  aspart  5 Units Subcutaneous TID WC   insulin  glargine  7 Units Subcutaneous Daily   lidocaine   1 patch Transdermal Q24H   LORazepam   0.25 mg Oral BID   metoprolol  succinate  50 mg Oral QHS   multivitamin with minerals  1 tablet Oral Daily   pantoprazole   40 mg Oral BID AC   thiamine  100 mg Oral Daily   Continuous Infusions:   Anti-infectives (From admission, onward)    Start     Dose/Rate Route Frequency Ordered Stop   12/24/23 1000  azithromycin  (ZITHROMAX ) tablet 500 mg        500 mg Oral Daily 12/23/23 1220 12/25/23 1001   12/21/23 0745  cefTRIAXone  (ROCEPHIN ) 2 g in sodium chloride  0.9 % 100 mL IVPB  Status:  Discontinued        2 g 200 mL/hr over 30 Minutes Intravenous Every 24 hours 12/21/23 0744 12/21/23 0746   12/21/23 0745  azithromycin  (ZITHROMAX ) 500 mg in sodium chloride  0.9 % 250 mL IVPB  Status:  Discontinued        500 mg 250 mL/hr over 60 Minutes Intravenous Every 24 hours 12/21/23 0744 12/21/23 0746   12/21/23 0630  cefTRIAXone  (ROCEPHIN ) 2 g in sodium chloride  0.9 % 100 mL IVPB        2 g 200 mL/hr over 30 Minutes Intravenous Every 24 hours 12/21/23 0629 12/25/23 9386   12/21/23 0630  azithromycin  (ZITHROMAX ) 500 mg in sodium chloride  0.9 % 250 mL IVPB  Status:  Discontinued        500 mg 250 mL/hr over 60 Minutes Intravenous Every 24 hours 12/21/23 0629 12/23/23 1220              Family Communication/Anticipated D/C date and plan/Code Status   DVT prophylaxis: SCDs Start: 12/21/23 0740 apixaban  (ELIQUIS ) tablet 10 mg  apixaban  (ELIQUIS ) tablet 5 mg     Code Status: Limited: Do not attempt resuscitation (DNR) -DNR-LIMITED -Do Not Intubate/DNI   Family Communication: None Disposition Plan: Plan to discharge to SNF   Status is: Inpatient Remains inpatient appropriate because: CHF exacerbation on IV  Lasix        Subjective:   Interval events noted.  She complains of general weakness  Objective:    Vitals:   01/01/24 0503 01/01/24 0820 01/01/24 1132 01/01/24 1548  BP: (!) 121/50 (!) 110/52 (!) 103/59 (!) 96/52  Pulse: 88 86 94 93  Resp: 18 16    Temp: 97.9 F (36.6 C) 97.7 F (36.5 C) 98 F (36.7 C) 97.9 F (36.6 C)  TempSrc:   Oral Oral  SpO2: 92% 92% 93% 90%  Weight: 72.2 kg     Height:       No data found.   Intake/Output Summary (Last 24 hours) at 01/01/2024 1724 Last data filed  at 01/01/2024 1555 Gross per 24 hour  Intake 440 ml  Output 1400 ml  Net -960 ml   Filed Weights   12/26/23 0500 12/31/23 0424 01/01/24 0503  Weight: 70.2 kg 75.7 kg 72.2 kg    Exam:  GEN: NAD SKIN: Warm and dry EYES: No pallor or icterus ENT: MMM CV: RRR PULM: CTA B ABD: soft, ND, NT, +BS CNS: AAO x 3, non focal EXT: No edema or tenderness        Data Reviewed:   I have personally reviewed following labs and imaging studies:  Labs: Labs show the following:   Basic Metabolic Panel: Recent Labs  Lab 12/26/23 0346 12/27/23 0405 12/28/23 0335 12/29/23 0349 12/30/23 0433 12/31/23 0358 01/01/24 0436  NA 131* 127* 127* 126* 126* 128* 128*  K 4.0 4.5 4.2 4.3 4.4 4.6 3.9  CL 95* 93* 93* 93* 91* 93* 94*  CO2 21* 20* 21* 21* 20* 21* 22  GLUCOSE 237* 279* 186* 180* 183* 162* 159*  BUN 34* 44* 45* 43* 48* 56* 49*  CREATININE 1.16* 1.29* 1.18* 1.09* 1.18* 1.20* 1.08*  CALCIUM  8.9 8.8* 8.6* 9.0 8.9 8.8* 8.6*  MG 1.9 1.8  --   --   --   --  2.1  PHOS  --   --   --   --   --   --  4.9*   GFR Estimated Creatinine Clearance: 33.8 mL/min (A) (by C-G formula based on SCr of 1.08 mg/dL (H)). Liver Function Tests: No results for input(s): AST, ALT, ALKPHOS, BILITOT, PROT, ALBUMIN  in the last 168 hours. No results for input(s): LIPASE, AMYLASE in the last 168 hours. No results for input(s): AMMONIA in the last 168 hours. Coagulation profile No  results for input(s): INR, PROTIME in the last 168 hours.  CBC: Recent Labs  Lab 12/26/23 0346 12/28/23 0335 12/30/23 0433 01/01/24 0436  WBC 8.8 9.0 9.8 8.3  HGB 11.4* 10.3* 9.8* 9.6*  HCT 34.6* 31.6* 30.4* 29.5*  MCV 107.8* 108.2* 106.7* 107.3*  PLT 229 219 225 276   Cardiac Enzymes: No results for input(s): CKTOTAL, CKMB, CKMBINDEX, TROPONINI in the last 168 hours. BNP (last 3 results) Recent Labs    12/22/23 1247 12/25/23 1343  PROBNP 418.0* 6,107.0*   CBG: Recent Labs  Lab 12/31/23 1822 12/31/23 2038 01/01/24 0818 01/01/24 1119 01/01/24 1707  GLUCAP 226* 229* 161* 268* 244*   D-Dimer: No results for input(s): DDIMER in the last 72 hours. Hgb A1c: No results for input(s): HGBA1C in the last 72 hours. Lipid Profile: No results for input(s): CHOL, HDL, LDLCALC, TRIG, CHOLHDL, LDLDIRECT in the last 72 hours. Thyroid function studies: No results for input(s): TSH, T4TOTAL, T3FREE, THYROIDAB in the last 72 hours.  Invalid input(s): FREET3 Anemia work up: No results for input(s): VITAMINB12, FOLATE, FERRITIN, TIBC, IRON, RETICCTPCT in the last 72 hours. Sepsis Labs: Recent Labs  Lab 12/26/23 0346 12/28/23 0335 12/30/23 0433 01/01/24 0436  WBC 8.8 9.0 9.8 8.3    Microbiology No results found for this or any previous visit (from the past 240 hours).  Procedures and diagnostic studies:  DG Shoulder Right Result Date: 12/31/2023 CLINICAL DATA:  Pain aggravated by activities of daily living. EXAM: DG SHOULDER 2+V*R* COMPARISON:  None Available. FINDINGS: There is no evidence of fracture or dislocation. Mild acromioclavicular degenerative change with spurring. The glenohumeral joint space is normal on provided views. Mild subcortical cystic change in the lateral humeral head. No erosive or bony destructive change. Soft tissues  are unremarkable. IMPRESSION: 1. Mild acromioclavicular degenerative change. 2. Mild  subcortical cystic change in the lateral humeral head, can be seen with rotator cuff arthropathy. Electronically Signed   By: Andrea Gasman M.D.   On: 12/31/2023 23:26   DG Chest Port 1 View Result Date: 12/31/2023 CLINICAL DATA:  Shortness of breath.  Weakness. EXAM: PORTABLE CHEST 1 VIEW COMPARISON:  12/27/2023 FINDINGS: Lower lung volumes from prior exam. Progression in ill-defined bibasilar opacities. There may be small pleural effusions. Stable heart size and mediastinal contours. No pulmonary edema. No pneumothorax. IMPRESSION: Progression in ill-defined bibasilar opacities, atelectasis versus pneumonia. Possible small pleural effusions. Electronically Signed   By: Andrea Gasman M.D.   On: 12/31/2023 15:58               LOS: 11 days   Leahmarie Gasiorowski  [facility]   Pager on [email or web address]. If 7PM-7AM, please contact night-coverage at [email or web address]     01/01/2024, 5:24 PM           "

## 2024-01-01 NOTE — Progress Notes (Addendum)
 Nutrition Follow-up  DOCUMENTATION CODES:   Obesity unspecified  INTERVENTION:   -Continue MVI with minerals daily -Continue 100 mg thiamine daily x 7 days -Continue carb modified diet -Continue Ensure Plus High Protein po BID, each supplement provides 350 kcal and 20 grams of protein  -Continue Magic cup TID with meals, each supplement provides 290 kcal and 9 grams of protein  -RD provided Nutrition post Hospital Stay handout to AVS/ discharge summary   NUTRITION DIAGNOSIS:   Inadequate oral intake related to acute illness as evidenced by meal completion < 25%.  Ongoing  GOAL:   Patient will meet greater than or equal to 90% of their needs  Progressing   MONITOR:   PO intake, Supplement acceptance, Labs, Weight trends, Skin, I & O's  REASON FOR ASSESSMENT:   Consult Assessment of nutrition requirement/status  ASSESSMENT:   83 y/o female with h/o CHF, asthma, HTN, HLD, DM, Afib, CKD, polycythemia and recent admission to [facility] for asthma exacerbation and who is now admitted with community-acquired pneumonia, asthma exacerbation, metapneumovirus infection, new DVT and AKI.  Patient admitted with possible community-acquired pneumonia, asthma exacerbation, metapneumovirus infection  Reviewed I/O's: -300 ml x 24 hours and -580 ml x 24 hours  UOP: 300 ml x 24 hours  Per hospitalist notes, plan for x-ray of right shoulder, due to complaints of right sided should pain and difficulty with range of motion. Sodium and volume status are improving with lasix  per MD report.   Per MD notes, patient reports poor oral intake due to shortness of breath and cough.   Spoke with with patient at bedside, who reports feeling a little better in comparison to yesterday. Patient endorses poor appetite due to generally feeling unwell, but is motivated to do better. Observed breakfast tray; patient consumed 50% of Magic Cup, 75% of oatmeal, and 50% of eggs and french toast. Also assisted  patient with Ensure supplement; she consumed 75% of supplement during visit.   Patient reports she does have some weakness in her shoulders and her hands, which makes feeding herself difficult. She is accepting of feeding assistance with meals.   PTA, patient reports good appetite (I eat like a pig). She generally consumes 3 meals per day (Breakfast: eggs and french toast; Lunch and dinner: meat, starch, and vegetable). Patient also endorses snacking throughout the day on crackers and fruits and vegetables.   Patient denies any weight loss. She reports her UBW is around 172-175#. Reviewed weight history; patient has experienced a 7.4% weight loss over the past 7 months, which is not significant for time frame. Per nursing assessment and RD assessment, patient with mild edema, which may be masking true weight loss as well as fat and muscle depletions. Weight has been ranging from 70.2-75.7 kg over the past 6 days; suspect some weight loss related to diuresis and volume status.   Discussed importance of good meal and supplement intake to promote healing. Patient amenable to continue supplements and discussed wanting to drink Ensure at home. RD discussed where she could purchase at discharge.   Medications reviewed and include lasix , protonix , and thiamine.    Per TOC notes, plan to discharge to SNF [facility]) once medically stable.   Lab Results  Component Value Date   HGBA1C 7.8 (H) 12/21/2023   PTA DM medications are 2 tablets 50-500 janumet daily. Per ADA's Standards of Medical Care of Diabetes, glycemic targets for older adults who have multiple co-morbidities, cognitive impairments, and functional dependence should be less stringent (Hgb  A1c <8.0-8.5).  Hgb A1c is at acceptable range per age.   Labs reviewed: Na: 128, Phos: 4.9, K, and Md WDL. CBGS: 143-225 (inpatient orders for glycemic control are 0-5 units insulin  aspart daily at bedtime, 0-9 units insulin  aspart TID with meals, 5  units insulin  aspart TID with meals, and 7 units insulin  glargine daily).    NUTRITION - FOCUSED PHYSICAL EXAM:  Flowsheet Row Most Recent Value  Orbital Region No depletion  Upper Arm Region Mild depletion  Thoracic and Lumbar Region No depletion  Buccal Region No depletion  Temple Region No depletion  Clavicle Bone Region No depletion  Scapular Bone Region No depletion  Dorsal Hand Mild depletion  Patellar Region No depletion  Anterior Thigh Region No depletion  Posterior Calf Region No depletion  Edema (RD Assessment) Mild  Hair Reviewed  Eyes Reviewed  Mouth Reviewed  Skin Reviewed  Nails Reviewed    Diet Order:   Diet Order             Diet Carb Modified Room service appropriate? Yes; Fluid restriction: 1500 mL Fluid  Diet effective now                   EDUCATION NEEDS:   Education needs have been addressed  Skin:  Skin Assessment: Reviewed RN Assessment  Last BM:  12/31/23 (type 6)  Height:   Ht Readings from Last 1 Encounters:  12/29/23 5' (1.524 m)    Weight:   Wt Readings from Last 1 Encounters:  01/01/24 72.2 kg    Ideal Body Weight:  45.45 kg  BMI:  Body mass index is 31.09 kg/m.  Estimated Nutritional Needs:   Kcal:  1500-1700kcal/day  Protein:  75-85g/day  Fluid:  1.3-1.5L/day    Margery ORN, RD, LDN, CDCES Registered Dietitian III Certified Diabetes Care and Education Specialist If unable to reach this RD, please use RD Inpatient group chat on secure chat between hours of 8am-4 pm daily

## 2024-01-01 NOTE — Plan of Care (Signed)
" °  Problem: Respiratory: Goal: Ability to maintain adequate ventilation will improve Outcome: Progressing   Problem: Education: Goal: Ability to describe self-care measures that may prevent or decrease complications (Diabetes Survival Skills Education) will improve Outcome: Progressing   "

## 2024-01-01 NOTE — Discharge Instructions (Signed)
Nutrition Post Hospital Stay °Proper nutrition can help your body recover from illness and injury.   °Foods and beverages high in protein, vitamins, and minerals help rebuild muscle loss, promote healing, & reduce fall risk.  ° °•In addition to eating healthy foods, a nutrition shake is an easy, delicious way to get the nutrition you need during and after your hospital stay ° °It is recommended that you continue to drink 2 bottles per day of:       Ensure Plus for at least 1 month (30 days) after your hospital stay  ° °Tips for adding a nutrition shake into your routine: °As allowed, drink one with vitamins or medications instead of water or juice °Enjoy one as a tasty mid-morning or afternoon snack °Drink cold or make a milkshake out of it °Drink one instead of milk with cereal or snacks °Use as a coffee creamer °  °Available at the following grocery stores and pharmacies:           °* [employer] * [employer] * [employer]  °* [employer]          * [employer] * [employer]  °* [facility]      * Target  * [employer]   °* [facility]  * [employer]   °* [facility] [phone]  °          °For COUPONS visit: www.[email or web address].com/join or [email or web address]/[email or web address]/sign-[email or web address]  ° °Suggested Substitutions °Ensure Plus = Boost Plus = Carnation Breakfast Essentials = Boost Compact °Ensure Active Clear = Boost Breeze °Glucerna Shake = Boost Glucose Control = Carnation Breakfast Essentials SUGAR FREE ° °  °

## 2024-01-01 NOTE — Progress Notes (Signed)
 " [facility]  ROUNDING NOTE   Subjective:   Patient seen resting quietly in bed Alert and oriented Denies pain Appetite remains fair  Sodium remains 128  Objective:  Vital signs in last 24 hours:  Temp:  [97.7 F (36.5 C)-98.6 F (37 C)] 98 F (36.7 C) (12/31 1132) Pulse Rate:  [86-111] 94 (12/31 1132) Resp:  [16-20] 16 (12/31 0820) BP: (101-121)/(50-66) 103/59 (12/31 1132) SpO2:  [90 %-93 %] 93 % (12/31 1132) Weight:  [72.2 kg] 72.2 kg (12/31 0503)  Weight change: -3.5 kg Filed Weights   12/26/23 0500 12/31/23 0424 01/01/24 0503  Weight: 70.2 kg 75.7 kg 72.2 kg    Intake/Output: I/O last 3 completed shifts: In: -  Out: 300 [Urine:300]   Intake/Output this shift:  Total I/O In: 200 [P.O.:200] Out: 300 [Urine:300]  Physical Exam: General: NAD  Head: Normocephalic, atraumatic. Moist oral mucosal membranes  Eyes: Anicteric  Lungs:  Clear to auscultation, normal effort  Heart: Regular rate and rhythm  Abdomen:  Soft, nontender  Extremities:  No peripheral edema.  Neurologic: Awake, alert, conversant  Skin: Warm,dry, no rash       Basic Metabolic Panel: Recent Labs  Lab 12/26/23 0346 12/27/23 0405 12/28/23 0335 12/29/23 0349 12/30/23 0433 12/31/23 0358 01/01/24 0436  NA 131* 127* 127* 126* 126* 128* 128*  K 4.0 4.5 4.2 4.3 4.4 4.6 3.9  CL 95* 93* 93* 93* 91* 93* 94*  CO2 21* 20* 21* 21* 20* 21* 22  GLUCOSE 237* 279* 186* 180* 183* 162* 159*  BUN 34* 44* 45* 43* 48* 56* 49*  CREATININE 1.16* 1.29* 1.18* 1.09* 1.18* 1.20* 1.08*  CALCIUM  8.9 8.8* 8.6* 9.0 8.9 8.8* 8.6*  MG 1.9 1.8  --   --   --   --  2.1  PHOS  --   --   --   --   --   --  4.9*    Liver Function Tests: No results for input(s): AST, ALT, ALKPHOS, BILITOT, PROT, ALBUMIN  in the last 168 hours. No results for input(s): LIPASE, AMYLASE in the last 168 hours. No results for input(s): AMMONIA in the last 168 hours.  CBC: Recent Labs  Lab 12/26/23 0346  12/28/23 0335 12/30/23 0433 01/01/24 0436  WBC 8.8 9.0 9.8 8.3  HGB 11.4* 10.3* 9.8* 9.6*  HCT 34.6* 31.6* 30.4* 29.5*  MCV 107.8* 108.2* 106.7* 107.3*  PLT 229 219 225 276    Cardiac Enzymes: No results for input(s): CKTOTAL, CKMB, CKMBINDEX, TROPONINI in the last 168 hours.  BNP: Invalid input(s): POCBNP  CBG: Recent Labs  Lab 12/31/23 1228 12/31/23 1822 12/31/23 2038 01/01/24 0818 01/01/24 1119  GLUCAP 225* 226* 229* 161* 268*    Microbiology: Results for orders placed or performed during the hospital encounter of 12/21/23  Resp panel by RT-PCR (RSV, Flu A&B, Covid) Anterior Nasal Swab     Status: None   Collection Time: 12/21/23 12:33 AM   Specimen: Anterior Nasal Swab  Result Value Ref Range Status   SARS Coronavirus 2 by RT PCR NEGATIVE NEGATIVE Final    Comment: (NOTE) SARS-CoV-2 target nucleic acids are NOT DETECTED.  The SARS-CoV-2 RNA is generally detectable in upper respiratory specimens during the acute phase of infection. The lowest concentration of SARS-CoV-2 viral copies this assay can detect is 138 copies/mL. A negative result does not preclude SARS-Cov-2 infection and should not be used as the sole basis for treatment or other patient management decisions. A negative result may occur with  improper specimen collection/handling, submission of specimen other than nasopharyngeal swab, presence of viral mutation(s) within the areas targeted by this assay, and inadequate number of viral copies(<138 copies/mL). A negative result must be combined with clinical observations, patient history, and epidemiological information. The expected result is Negative.  Fact Sheet for Patients:  bloggercourse.com  Fact Sheet for Healthcare Providers:  seriousbroker.it  This test is no t yet approved or cleared by the [place]  FDA and  has been authorized for detection and/or diagnosis of SARS-CoV-2  by FDA under an Emergency Use Authorization (EUA). This EUA will remain  in effect (meaning this test can be used) for the duration of the COVID-19 declaration under Section 564(b)(1) of the Act, 21 U.S.C.section 360bbb-3(b)(1), unless the authorization is terminated  or revoked sooner.       Influenza A by PCR NEGATIVE NEGATIVE Final   Influenza B by PCR NEGATIVE NEGATIVE Final    Comment: (NOTE) The Xpert Xpress SARS-CoV-2/FLU/RSV plus assay is intended as an aid in the diagnosis of influenza from Nasopharyngeal swab specimens and should not be used as a sole basis for treatment. Nasal washings and aspirates are unacceptable for Xpert Xpress SARS-CoV-2/FLU/RSV testing.  Fact Sheet for Patients: bloggercourse.com  Fact Sheet for Healthcare Providers: seriousbroker.it  This test is not yet approved or cleared by the [place]  FDA and has been authorized for detection and/or diagnosis of SARS-CoV-2 by FDA under an Emergency Use Authorization (EUA). This EUA will remain in effect (meaning this test can be used) for the duration of the COVID-19 declaration under Section 564(b)(1) of the Act, 21 U.S.C. section 360bbb-3(b)(1), unless the authorization is terminated or revoked.     Resp Syncytial Virus by PCR NEGATIVE NEGATIVE Final    Comment: (NOTE) Fact Sheet for Patients: bloggercourse.com  Fact Sheet for Healthcare Providers: seriousbroker.it  This test is not yet approved or cleared by the [place]  FDA and has been authorized for detection and/or diagnosis of SARS-CoV-2 by FDA under an Emergency Use Authorization (EUA). This EUA will remain in effect (meaning this test can be used) for the duration of the COVID-19 declaration under Section 564(b)(1) of the Act, 21 U.S.C. section 360bbb-3(b)(1), unless the authorization is terminated or revoked.  Performed at  [facility], [address] [place], [place] [address]   Culture, blood (x 2)     Status: None   Collection Time: 12/21/23  6:52 AM   Specimen: BLOOD  Result Value Ref Range Status   Specimen Description BLOOD LEFT ANTECUBITAL  Final   Special Requests   Final    BOTTLES DRAWN AEROBIC AND ANAEROBIC Blood Culture adequate volume   Culture   Final    NO GROWTH 5 DAYS Performed at [facility], [address] [place], [place] [address]    Report Status 12/26/2023 FINAL  Final  Culture, blood (x 2)     Status: None   Collection Time: 12/21/23  7:10 AM   Specimen: BLOOD  Result Value Ref Range Status   Specimen Description BLOOD BLOOD RIGHT ARM  Final   Special Requests   Final    BOTTLES DRAWN AEROBIC AND ANAEROBIC Blood Culture adequate volume   Culture   Final    NO GROWTH 5 DAYS Performed at [facility], [address] [place], [place] [address]    Report Status 12/26/2023 FINAL  Final    Coagulation Studies: No results for input(s): LABPROT, INR in the last 72 hours.  Urinalysis: No results  for input(s): COLORURINE, LABSPEC, PHURINE, GLUCOSEU, HGBUR, BILIRUBINUR, KETONESUR, PROTEINUR, UROBILINOGEN, NITRITE, LEUKOCYTESUR in the last 72 hours.  Invalid input(s): APPERANCEUR    Imaging: DG Shoulder Right Result Date: 12/31/2023 CLINICAL DATA:  Pain aggravated by activities of daily living. EXAM: DG SHOULDER 2+V*R* COMPARISON:  None Available. FINDINGS: There is no evidence of fracture or dislocation. Mild acromioclavicular degenerative change with spurring. The glenohumeral joint space is normal on provided views. Mild subcortical cystic change in the lateral humeral head. No erosive or bony destructive change. Soft tissues are unremarkable. IMPRESSION: 1. Mild acromioclavicular degenerative change. 2. Mild subcortical cystic change in the lateral humeral head, can be seen with rotator cuff arthropathy.  Electronically Signed   By: Andrea Gasman M.D.   On: 12/31/2023 23:26   DG Chest Port 1 View Result Date: 12/31/2023 CLINICAL DATA:  Shortness of breath.  Weakness. EXAM: PORTABLE CHEST 1 VIEW COMPARISON:  12/27/2023 FINDINGS: Lower lung volumes from prior exam. Progression in ill-defined bibasilar opacities. There may be small pleural effusions. Stable heart size and mediastinal contours. No pulmonary edema. No pneumothorax. IMPRESSION: Progression in ill-defined bibasilar opacities, atelectasis versus pneumonia. Possible small pleural effusions. Electronically Signed   By: Andrea Gasman M.D.   On: 12/31/2023 15:58     Medications:     apixaban   10 mg Oral BID   Followed by   NOREEN ON 01/05/2024] apixaban   5 mg Oral BID   aspirin  EC  81 mg Oral Daily   atorvastatin   10 mg Oral Daily   cycloSPORINE   1 drop Both Eyes BID   feeding supplement  237 mL Oral BID BM   fluticasone  furoate-vilanterol  1 puff Inhalation Daily   furosemide   20 mg Intravenous Daily   hydrocortisone cream   Topical BID   insulin  aspart  0-5 Units Subcutaneous QHS   insulin  aspart  0-9 Units Subcutaneous TID WC   insulin  aspart  5 Units Subcutaneous TID WC   insulin  glargine  7 Units Subcutaneous Daily   lidocaine   1 patch Transdermal Q24H   LORazepam   0.25 mg Oral BID   metoprolol  succinate  50 mg Oral QHS   multivitamin with minerals  1 tablet Oral Daily   pantoprazole   40 mg Oral BID AC   thiamine  100 mg Oral Daily   acetaminophen  **OR** acetaminophen , bisacodyl , diclofenac  Sodium, guaiFENesin , ipratropium-albuterol , ondansetron  **OR** ondansetron  (ZOFRAN ) IV, polyethylene glycol, traMADol   Assessment/ Plan:  Ms. Jacqueline Arroyo is a 83 y.o.  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 No tobacco history. Recent 2D echo shows LVEF 40 to 45% with mildly decreased LV function. Global hypokinesis Primary team has ordered IV furosemide , no improvement thus far with sodium levels.  Sodium remains 128. Continue supportive measures and encourage oral intake. Continue Ensure supplementation. Continue IV furosemide  20 mg daily. Will continue to monitor.     LOS: 11 Jamoni Hewes 12/31/20251:56 PM   "

## 2024-01-02 DIAGNOSIS — J189 Pneumonia, unspecified organism: Secondary | ICD-10-CM | POA: Diagnosis not present

## 2024-01-02 LAB — CBC
HCT: 28.5 % — ABNORMAL LOW (ref 36.0–46.0)
Hemoglobin: 9.5 g/dL — ABNORMAL LOW (ref 12.0–15.0)
MCH: 35.4 pg — ABNORMAL HIGH (ref 26.0–34.0)
MCHC: 33.3 g/dL (ref 30.0–36.0)
MCV: 106.3 fL — ABNORMAL HIGH (ref 80.0–100.0)
Platelets: 290 K/uL (ref 150–400)
RBC: 2.68 MIL/uL — ABNORMAL LOW (ref 3.87–5.11)
RDW: 13.1 % (ref 11.5–15.5)
WBC: 7.9 K/uL (ref 4.0–10.5)
nRBC: 0 % (ref 0.0–0.2)

## 2024-01-02 LAB — BASIC METABOLIC PANEL WITH GFR
Anion gap: 11 (ref 5–15)
BUN: 45 mg/dL — ABNORMAL HIGH (ref 8–23)
CO2: 22 mmol/L (ref 22–32)
Calcium: 8.4 mg/dL — ABNORMAL LOW (ref 8.9–10.3)
Chloride: 96 mmol/L — ABNORMAL LOW (ref 98–111)
Creatinine, Ser: 0.97 mg/dL (ref 0.44–1.00)
GFR, Estimated: 57 mL/min — ABNORMAL LOW
Glucose, Bld: 229 mg/dL — ABNORMAL HIGH (ref 70–99)
Potassium: 4 mmol/L (ref 3.5–5.1)
Sodium: 129 mmol/L — ABNORMAL LOW (ref 135–145)

## 2024-01-02 LAB — GLUCOSE, CAPILLARY
Glucose-Capillary: 182 mg/dL — ABNORMAL HIGH (ref 70–99)
Glucose-Capillary: 274 mg/dL — ABNORMAL HIGH (ref 70–99)
Glucose-Capillary: 211 mg/dL — ABNORMAL HIGH (ref 70–99)
Glucose-Capillary: 234 mg/dL — ABNORMAL HIGH (ref 70–99)

## 2024-01-02 MED ORDER — HYDROXYUREA 500 MG PO CAPS
500.0000 mg | ORAL_CAPSULE | Freq: Two times a day (BID) | ORAL | Status: DC
  Administered 2024-01-02 – 2024-01-08 (×13): 500 mg via ORAL
  Filled 2024-01-02 (×13): qty 1

## 2024-01-02 MED ORDER — TORSEMIDE 20 MG PO TABS
10.0000 mg | ORAL_TABLET | Freq: Every day | ORAL | Status: DC
  Administered 2024-01-03 – 2024-01-06 (×3): 10 mg via ORAL
  Filled 2024-01-02 (×5): qty 1

## 2024-01-02 NOTE — Progress Notes (Signed)
 "    Progress Note    Jacqueline Arroyo  FMW:[id number] DOB: 01/10/1940  DOA: 12/21/2023 PCP: [facility]      Brief Narrative:    Medical records reviewed and are as summarized below:  Jacqueline Arroyo is a 84 y.o. female  with medical history significant for mild on surveillance, diabetes, HTN, HLD, asthma, polycythemia vera, HFrEF with last EF was around 35 to 40% from echo in 2025 July, CKD who presented to ED at [facility] for worsening shortness of breath cough and generalized weakness.  Patient had a recent hospitalization at [facility] and was discharged from the hospital on 12/16/2023 after being diagnosed with bronchitis and exacerbation of asthma.  She presented on 12/19  with worsening respiratory symptoms after she went home along with some chills, generalized weakness and worsening nausea.  Admitted for possible community-acquired pneumonia, asthma exacerbation, metapneumovirus infection.       Assessment/Plan:   Principal Problem:   CAP (community acquired pneumonia) Active Problems:   HFrEF (heart failure with reduced ejection fraction) (HCC)   HTN (hypertension)   HLD (hyperlipidemia)   DM (diabetes mellitus) (HCC)   Asthma, chronic   Polycythemia vera (HCC)   Nutrition Problem: Inadequate oral intake Etiology: acute illness  Signs/Symptoms: meal completion < 25%   Body mass index is 32.85 kg/m.   Possible CAP  Asthma exacerbation  Metapnemovirus + Chronic hypoxic respiratory failure - 2L [name] Patient recently treated at [facility] for asthma exacerbation and viral bronchitis (metapneumovirus +). She was discharged with albuterol  and steroid course Presented with worsening SOB, elevated D-dimer, VQ neg for PE. CXR with small opacity adjacent to elevated diaphragm.  Completed abx course, prednisone  x 5 days Home inhalers, DuoNebs as needed    Left popliteal vein DVT Suspect due to immobility elevated D-dimer, VQ neg for PE US  lower extremity shows  nonocclusive acute DVT in left popliteal vein On Eliquis  10 mg twice daily, is allergic to heparin /Lovenox     Acute on chronic HFrEF Echo shows EF 40 to 45%, LV global hypokinesis.  LA moderately dilated, trivial MR. 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  Unclear etiology IV Lasix  switched to torsemide.  Follow-up with nephrologist. Continue fluid restriction    T2DM  Steroid-induced hyperglycemia HbA1c 7.8 On NovoLog  5 units tid, SSI. Completed prednisone  Lantus  7u at night    H/o Polycythemia vera  Hgb around 9.5. Noted to be 12-13 during recent hospitalization. No obvious bleeding noted, denies active bleeding/melena Iron panel consistent with ACD Resume hydroxyurea PPI BID   Left ankle osteoarthritis Has chronic pain XR ankle - no fracture Change oxycodone  to Tramadol , Topical diclofenac  gel    Left wrist pain, left shoulder pain XR Left wrist no acute fracture History right shoulder showed mild acromioclavicular degenerative change, mild subcortical cystic change in the lateral humeral head. Analgesics as needed for pain.    Constipation Laxatives as needed     Comorbidities include hypertension, hyperlipidemia      Diet Order             Diet Carb Modified Room service appropriate? Yes; Fluid restriction: 1500 mL Fluid  Diet effective now  Consultants: Nephrologist  Procedures: None    Medications:    apixaban   10 mg Oral BID   Followed by   NOREEN ON 01/05/2024] apixaban   5 mg Oral BID   aspirin  EC  81 mg Oral Daily   atorvastatin   10 mg Oral Daily   cycloSPORINE   1 drop Both Eyes BID   feeding supplement  237 mL Oral BID BM   fluticasone  furoate-vilanterol  1 puff Inhalation Daily    hydrocortisone cream   Topical BID   hydroxyurea  500 mg Oral BID   insulin  aspart  0-5 Units Subcutaneous QHS   insulin  aspart  0-9 Units Subcutaneous TID WC   insulin  aspart  5 Units Subcutaneous TID WC   insulin  glargine  7 Units Subcutaneous Daily   lidocaine   1 patch Transdermal Q24H   LORazepam   0.25 mg Oral BID   metoprolol  succinate  50 mg Oral QHS   multivitamin with minerals  1 tablet Oral Daily   pantoprazole   40 mg Oral BID AC   thiamine  100 mg Oral Daily   [START ON 01/03/2024] torsemide  10 mg Oral Daily   Continuous Infusions:   Anti-infectives (From admission, onward)    Start     Dose/Rate Route Frequency Ordered Stop   12/24/23 1000  azithromycin  (ZITHROMAX ) tablet 500 mg        500 mg Oral Daily 12/23/23 1220 12/25/23 1001   12/21/23 0745  cefTRIAXone  (ROCEPHIN ) 2 g in sodium chloride  0.9 % 100 mL IVPB  Status:  Discontinued        2 g 200 mL/hr over 30 Minutes Intravenous Every 24 hours 12/21/23 0744 12/21/23 0746   12/21/23 0745  azithromycin  (ZITHROMAX ) 500 mg in sodium chloride  0.9 % 250 mL IVPB  Status:  Discontinued        500 mg 250 mL/hr over 60 Minutes Intravenous Every 24 hours 12/21/23 0744 12/21/23 0746   12/21/23 0630  cefTRIAXone  (ROCEPHIN ) 2 g in sodium chloride  0.9 % 100 mL IVPB        2 g 200 mL/hr over 30 Minutes Intravenous Every 24 hours 12/21/23 0629 12/25/23 9386   12/21/23 0630  azithromycin  (ZITHROMAX ) 500 mg in sodium chloride  0.9 % 250 mL IVPB  Status:  Discontinued        500 mg 250 mL/hr over 60 Minutes Intravenous Every 24 hours 12/21/23 0629 12/23/23 1220              Family Communication/Anticipated D/C date and plan/Code Status   DVT prophylaxis: SCDs Start: 12/21/23 0740 apixaban  (ELIQUIS ) tablet 10 mg  apixaban  (ELIQUIS ) tablet 5 mg     Code Status: Limited: Do not attempt resuscitation (DNR) -DNR-LIMITED -Do Not Intubate/DNI   Family Communication: None Disposition Plan: Plan to discharge to SNF   Status is:  Inpatient Remains inpatient appropriate because: CHF exacerbation on IV Lasix        Subjective:   Interval events noted.  She feels weak and tired.  No shortness of breath or chest pain.  Objective:    Vitals:   01/02/24 0356 01/02/24 0500 01/02/24 0745 01/02/24 1205  BP: (!) 106/50  (!) 101/59 102/60  Pulse: 91  85 91  Resp: 20     Temp: 98.4 F (36.9 C)  98.2 F (36.8 C) (!) 97.5 F (36.4 C)  TempSrc:   Oral Oral  SpO2: 91%  93% 91%  Weight:  76.3 kg    Height:       No  data found.   Intake/Output Summary (Last 24 hours) at 01/02/2024 1428 Last data filed at 01/02/2024 1210 Gross per 24 hour  Intake 720 ml  Output 2350 ml  Net -1630 ml   Filed Weights   12/31/23 0424 01/01/24 0503 01/02/24 0500  Weight: 75.7 kg 72.2 kg 76.3 kg    Exam:  GEN: NAD SKIN: Warm and dry EYES: No pallor or icterus ENT: MMM CV: RRR PULM: CTA B ABD: soft, ND, NT, +BS CNS: AAO x 3, non focal EXT: No edema or tenderness     Wound 01/02/24 1100 Pressure Injury Buttocks Right Stage 2 -  Partial thickness loss of dermis presenting as a shallow open injury with a red, pink wound bed without slough. (Active)     Data Reviewed:   I have personally reviewed following labs and imaging studies:  Labs: Labs show the following:   Basic Metabolic Panel: Recent Labs  Lab 12/27/23 0405 12/28/23 0335 12/29/23 0349 12/30/23 0433 12/31/23 0358 01/01/24 0436 01/02/24 0334  NA 127*   < > 126* 126* 128* 128* 129*  K 4.5   < > 4.3 4.4 4.6 3.9 4.0  CL 93*   < > 93* 91* 93* 94* 96*  CO2 20*   < > 21* 20* 21* 22 22  GLUCOSE 279*   < > 180* 183* 162* 159* 229*  BUN 44*   < > 43* 48* 56* 49* 45*  CREATININE 1.29*   < > 1.09* 1.18* 1.20* 1.08* 0.97  CALCIUM  8.8*   < > 9.0 8.9 8.8* 8.6* 8.4*  MG 1.8  --   --   --   --  2.1  --   PHOS  --   --   --   --   --  4.9*  --    < > = values in this interval not displayed.   GFR Estimated Creatinine Clearance: 38.7 mL/min (by C-G formula  based on SCr of 0.97 mg/dL). Liver Function Tests: No results for input(s): AST, ALT, ALKPHOS, BILITOT, PROT, ALBUMIN  in the last 168 hours. No results for input(s): LIPASE, AMYLASE in the last 168 hours. No results for input(s): AMMONIA in the last 168 hours. Coagulation profile No results for input(s): INR, PROTIME in the last 168 hours.  CBC: Recent Labs  Lab 12/28/23 0335 12/30/23 0433 01/01/24 0436 01/02/24 0334  WBC 9.0 9.8 8.3 7.9  HGB 10.3* 9.8* 9.6* 9.5*  HCT 31.6* 30.4* 29.5* 28.5*  MCV 108.2* 106.7* 107.3* 106.3*  PLT 219 225 276 290   Cardiac Enzymes: No results for input(s): CKTOTAL, CKMB, CKMBINDEX, TROPONINI in the last 168 hours. BNP (last 3 results) Recent Labs    12/22/23 1247 12/25/23 1343  PROBNP 418.0* 6,107.0*   CBG: Recent Labs  Lab 01/01/24 1119 01/01/24 1707 01/01/24 2005 01/02/24 0748 01/02/24 1209  GLUCAP 268* 244* 240* 182* 274*   D-Dimer: No results for input(s): DDIMER in the last 72 hours. Hgb A1c: No results for input(s): HGBA1C in the last 72 hours. Lipid Profile: No results for input(s): CHOL, HDL, LDLCALC, TRIG, CHOLHDL, LDLDIRECT in the last 72 hours. Thyroid function studies: No results for input(s): TSH, T4TOTAL, T3FREE, THYROIDAB in the last 72 hours.  Invalid input(s): FREET3 Anemia work up: No results for input(s): VITAMINB12, FOLATE, FERRITIN, TIBC, IRON, RETICCTPCT in the last 72 hours. Sepsis Labs: Recent Labs  Lab 12/28/23 0335 12/30/23 0433 01/01/24 0436 01/02/24 0334  WBC 9.0 9.8 8.3 7.9    Microbiology No results found  for this or any previous visit (from the past 240 hours).  Procedures and diagnostic studies:  DG Shoulder Right Result Date: 12/31/2023 CLINICAL DATA:  Pain aggravated by activities of daily living. EXAM: DG SHOULDER 2+V*R* COMPARISON:  None Available. FINDINGS: There is no evidence of fracture or dislocation. Mild  acromioclavicular degenerative change with spurring. The glenohumeral joint space is normal on provided views. Mild subcortical cystic change in the lateral humeral head. No erosive or bony destructive change. Soft tissues are unremarkable. IMPRESSION: 1. Mild acromioclavicular degenerative change. 2. Mild subcortical cystic change in the lateral humeral head, can be seen with rotator cuff arthropathy. Electronically Signed   By: Andrea Gasman M.D.   On: 12/31/2023 23:26               LOS: 12 days   Troye Hiemstra  [facility]   Pager on [email or web address]. If 7PM-7AM, please contact night-coverage at [email or web address]     01/02/2024, 2:28 PM           "

## 2024-01-02 NOTE — Plan of Care (Signed)

## 2024-01-02 NOTE — Progress Notes (Signed)
 " [facility]  ROUNDING NOTE   Subjective:   Patient seen laying in bed No family present States appetite improving Ensure on bedside table  Sodium remains 129  Objective:  Vital signs in last 24 hours:  Temp:  [97.9 F (36.6 C)-98.6 F (37 C)] 98.2 F (36.8 C) (01/01 0745) Pulse Rate:  [85-102] 85 (01/01 0745) Resp:  [20] 20 (01/01 0356) BP: (96-116)/(50-59) 101/59 (01/01 0745) SpO2:  [89 %-93 %] 93 % (01/01 0745) Weight:  [76.3 kg] 76.3 kg (01/01 0500)  Weight change: 4.1 kg Filed Weights   12/31/23 0424 01/01/24 0503 01/02/24 0500  Weight: 75.7 kg 72.2 kg 76.3 kg    Intake/Output: I/O last 3 completed shifts: In: 680 [P.O.:680] Out: 1750 [Urine:1750]   Intake/Output this shift:  Total I/O In: 240 [P.O.:240] Out: -   Physical Exam: General: NAD  Head: Normocephalic, atraumatic. Moist oral mucosal membranes  Eyes: Anicteric  Lungs:  Clear to auscultation, normal effort  Heart: Regular rate and rhythm  Abdomen:  Soft, nontender  Extremities:  No peripheral edema.  Neurologic: Awake, alert, conversant  Skin: Warm,dry, no rash       Basic Metabolic Panel: Recent Labs  Lab 12/27/23 0405 12/28/23 0335 12/29/23 0349 12/30/23 0433 12/31/23 0358 01/01/24 0436 01/02/24 0334  NA 127*   < > 126* 126* 128* 128* 129*  K 4.5   < > 4.3 4.4 4.6 3.9 4.0  CL 93*   < > 93* 91* 93* 94* 96*  CO2 20*   < > 21* 20* 21* 22 22  GLUCOSE 279*   < > 180* 183* 162* 159* 229*  BUN 44*   < > 43* 48* 56* 49* 45*  CREATININE 1.29*   < > 1.09* 1.18* 1.20* 1.08* 0.97  CALCIUM  8.8*   < > 9.0 8.9 8.8* 8.6* 8.4*  MG 1.8  --   --   --   --  2.1  --   PHOS  --   --   --   --   --  4.9*  --    < > = values in this interval not displayed.    Liver Function Tests: No results for input(s): AST, ALT, ALKPHOS, BILITOT, PROT, ALBUMIN  in the last 168 hours. No results for input(s): LIPASE, AMYLASE in the last 168 hours. No results for input(s): AMMONIA in  the last 168 hours.  CBC: Recent Labs  Lab 12/28/23 0335 12/30/23 0433 01/01/24 0436 01/02/24 0334  WBC 9.0 9.8 8.3 7.9  HGB 10.3* 9.8* 9.6* 9.5*  HCT 31.6* 30.4* 29.5* 28.5*  MCV 108.2* 106.7* 107.3* 106.3*  PLT 219 225 276 290    Cardiac Enzymes: No results for input(s): CKTOTAL, CKMB, CKMBINDEX, TROPONINI in the last 168 hours.  BNP: Invalid input(s): POCBNP  CBG: Recent Labs  Lab 01/01/24 0818 01/01/24 1119 01/01/24 1707 01/01/24 2005 01/02/24 0748  GLUCAP 161* 268* 244* 240* 182*    Microbiology: Results for orders placed or performed during the hospital encounter of 12/21/23  Resp panel by RT-PCR (RSV, Flu A&B, Covid) Anterior Nasal Swab     Status: None   Collection Time: 12/21/23 12:33 AM   Specimen: Anterior Nasal Swab  Result Value Ref Range Status   SARS Coronavirus 2 by RT PCR NEGATIVE NEGATIVE Final    Comment: (NOTE) SARS-CoV-2 target nucleic acids are NOT DETECTED.  The SARS-CoV-2 RNA is generally detectable in upper respiratory specimens during the acute phase of infection. The lowest concentration of SARS-CoV-2 viral  copies this assay can detect is 138 copies/mL. A negative result does not preclude SARS-Cov-2 infection and should not be used as the sole basis for treatment or other patient management decisions. A negative result may occur with  improper specimen collection/handling, submission of specimen other than nasopharyngeal swab, presence of viral mutation(s) within the areas targeted by this assay, and inadequate number of viral copies(<138 copies/mL). A negative result must be combined with clinical observations, patient history, and epidemiological information. The expected result is Negative.  Fact Sheet for Patients:  bloggercourse.com  Fact Sheet for Healthcare Providers:  seriousbroker.it  This test is no t yet approved or cleared by the [place]  FDA and  has  been authorized for detection and/or diagnosis of SARS-CoV-2 by FDA under an Emergency Use Authorization (EUA). This EUA will remain  in effect (meaning this test can be used) for the duration of the COVID-19 declaration under Section 564(b)(1) of the Act, 21 U.S.C.section 360bbb-3(b)(1), unless the authorization is terminated  or revoked sooner.       Influenza A by PCR NEGATIVE NEGATIVE Final   Influenza B by PCR NEGATIVE NEGATIVE Final    Comment: (NOTE) The Xpert Xpress SARS-CoV-2/FLU/RSV plus assay is intended as an aid in the diagnosis of influenza from Nasopharyngeal swab specimens and should not be used as a sole basis for treatment. Nasal washings and aspirates are unacceptable for Xpert Xpress SARS-CoV-2/FLU/RSV testing.  Fact Sheet for Patients: bloggercourse.com  Fact Sheet for Healthcare Providers: seriousbroker.it  This test is not yet approved or cleared by the [place]  FDA and has been authorized for detection and/or diagnosis of SARS-CoV-2 by FDA under an Emergency Use Authorization (EUA). This EUA will remain in effect (meaning this test can be used) for the duration of the COVID-19 declaration under Section 564(b)(1) of the Act, 21 U.S.C. section 360bbb-3(b)(1), unless the authorization is terminated or revoked.     Resp Syncytial Virus by PCR NEGATIVE NEGATIVE Final    Comment: (NOTE) Fact Sheet for Patients: bloggercourse.com  Fact Sheet for Healthcare Providers: seriousbroker.it  This test is not yet approved or cleared by the [place]  FDA and has been authorized for detection and/or diagnosis of SARS-CoV-2 by FDA under an Emergency Use Authorization (EUA). This EUA will remain in effect (meaning this test can be used) for the duration of the COVID-19 declaration under Section 564(b)(1) of the Act, 21 U.S.C. section 360bbb-3(b)(1), unless the  authorization is terminated or revoked.  Performed at [facility], [address] [place], [place] [address]   Culture, blood (x 2)     Status: None   Collection Time: 12/21/23  6:52 AM   Specimen: BLOOD  Result Value Ref Range Status   Specimen Description BLOOD LEFT ANTECUBITAL  Final   Special Requests   Final    BOTTLES DRAWN AEROBIC AND ANAEROBIC Blood Culture adequate volume   Culture   Final    NO GROWTH 5 DAYS Performed at [facility], [address], [place], [place] [address]    Report Status 12/26/2023 FINAL  Final  Culture, blood (x 2)     Status: None   Collection Time: 12/21/23  7:10 AM   Specimen: BLOOD  Result Value Ref Range Status   Specimen Description BLOOD BLOOD RIGHT ARM  Final   Special Requests   Final    BOTTLES DRAWN AEROBIC AND ANAEROBIC Blood Culture adequate volume   Culture   Final    NO GROWTH 5 DAYS Performed at  [facility], [address] [place], [place] [address]    Report Status 12/26/2023 FINAL  Final    Coagulation Studies: No results for input(s): LABPROT, INR in the last 72 hours.  Urinalysis: No results for input(s): COLORURINE, LABSPEC, PHURINE, GLUCOSEU, HGBUR, BILIRUBINUR, KETONESUR, PROTEINUR, UROBILINOGEN, NITRITE, LEUKOCYTESUR in the last 72 hours.  Invalid input(s): APPERANCEUR    Imaging: DG Shoulder Right Result Date: 12/31/2023 CLINICAL DATA:  Pain aggravated by activities of daily living. EXAM: DG SHOULDER 2+V*R* COMPARISON:  None Available. FINDINGS: There is no evidence of fracture or dislocation. Mild acromioclavicular degenerative change with spurring. The glenohumeral joint space is normal on provided views. Mild subcortical cystic change in the lateral humeral head. No erosive or bony destructive change. Soft tissues are unremarkable. IMPRESSION: 1. Mild acromioclavicular degenerative change. 2. Mild subcortical cystic change in the lateral humeral head,  can be seen with rotator cuff arthropathy. Electronically Signed   By: Andrea Gasman M.D.   On: 12/31/2023 23:26     Medications:     apixaban   10 mg Oral BID   Followed by   NOREEN ON 01/05/2024] apixaban   5 mg Oral BID   aspirin  EC  81 mg Oral Daily   atorvastatin   10 mg Oral Daily   cycloSPORINE   1 drop Both Eyes BID   feeding supplement  237 mL Oral BID BM   fluticasone  furoate-vilanterol  1 puff Inhalation Daily   hydrocortisone cream   Topical BID   insulin  aspart  0-5 Units Subcutaneous QHS   insulin  aspart  0-9 Units Subcutaneous TID WC   insulin  aspart  5 Units Subcutaneous TID WC   insulin  glargine  7 Units Subcutaneous Daily   lidocaine   1 patch Transdermal Q24H   LORazepam   0.25 mg Oral BID   metoprolol  succinate  50 mg Oral QHS   multivitamin with minerals  1 tablet Oral Daily   pantoprazole   40 mg Oral BID AC   thiamine  100 mg Oral Daily   [START ON 01/03/2024] torsemide  10 mg Oral Daily   acetaminophen  **OR** acetaminophen , bisacodyl , diclofenac  Sodium, guaiFENesin , ipratropium-albuterol , ondansetron  **OR** ondansetron  (ZOFRAN ) IV, polyethylene glycol, traMADol   Assessment/ Plan:  Ms. Jacqueline Arroyo is a 84 y.o.  female with past medical history of diabetes, hypertension, asthma, CHF and polycythemia vera, who was admitted to [facility] on 12/21/2023 for Chills [R68.83] CAP (community acquired pneumonia) [J18.9] Diabetic ketoacidosis without coma associated with type 2 diabetes mellitus (HCC) [E11.10] Community acquired pneumonia, unspecified laterality [J18.9] Sepsis, due to unspecified organism, unspecified whether acute organ dysfunction present (HCC) [A41.9]   Hyponatremia, likely secondary to pneumonia and volume overload, workup in progress. No tobacco history. Recent 2D echo shows LVEF 40 to 45% with mildly decreased LV function. Global hypokinesis Primary team has ordered IV furosemide , no improvement thus far with sodium levels.  Sodium 129. Continue  supportive measures and encourage oral intake. Continue Ensure supplementation. Will transition to oral Torsemide 10mg  tomorrow. Patient cleared to discharge from renal stance.     LOS: 12 Jacqueline Arroyo 1/1/202611:36 AM   "

## 2024-01-03 DIAGNOSIS — J189 Pneumonia, unspecified organism: Secondary | ICD-10-CM | POA: Diagnosis not present

## 2024-01-03 LAB — GLUCOSE, CAPILLARY
Glucose-Capillary: 200 mg/dL — ABNORMAL HIGH (ref 70–99)
Glucose-Capillary: 205 mg/dL — ABNORMAL HIGH (ref 70–99)
Glucose-Capillary: 204 mg/dL — ABNORMAL HIGH (ref 70–99)
Glucose-Capillary: 187 mg/dL — ABNORMAL HIGH (ref 70–99)

## 2024-01-03 LAB — BASIC METABOLIC PANEL WITH GFR
Anion gap: 11 (ref 5–15)
BUN: 39 mg/dL — ABNORMAL HIGH (ref 8–23)
CO2: 24 mmol/L (ref 22–32)
Calcium: 8.9 mg/dL (ref 8.9–10.3)
Chloride: 96 mmol/L — ABNORMAL LOW (ref 98–111)
Creatinine, Ser: 0.88 mg/dL (ref 0.44–1.00)
GFR, Estimated: >60 mL/min
Glucose, Bld: 198 mg/dL — ABNORMAL HIGH (ref 70–99)
Potassium: 4 mmol/L (ref 3.5–5.1)
Sodium: 131 mmol/L — ABNORMAL LOW (ref 135–145)

## 2024-01-03 MED ORDER — INSULIN GLARGINE 100 UNIT/ML ~~LOC~~ SOLN
10.0000 [IU] | Freq: Every day | SUBCUTANEOUS | Status: DC
  Administered 2024-01-04 – 2024-01-05 (×2): 10 [IU] via SUBCUTANEOUS
  Filled 2024-01-03 (×3): qty 0.1

## 2024-01-03 NOTE — Inpatient Diabetes Management (Signed)
 Inpatient Diabetes Program Recommendations  AACE/ADA: New Consensus Statement on Inpatient Glycemic Control (2015)  Target Ranges:  Prepandial:   less than 140 mg/dL      Peak postprandial:   less than 180 mg/dL (1-2 hours)      Critically ill patients:  140 - 180 mg/dL    Latest Reference Range & Units 01/02/24 07:48 01/02/24 12:09 01/02/24 16:59 01/02/24 20:58  Glucose-Capillary 70 - 99 mg/dL 817 (H)  7 units Novolog   7 units Lantus  274 (H)  10 units Novolog   211 (H)  8 units Novolog   234 (H)  2 units Novolog    (H): Data is abnormally high  Latest Reference Range & Units 01/03/24 07:49  Glucose-Capillary 70 - 99 mg/dL 799 (H)  7 units Novolog   7 units Lantus   (H): Data is abnormally high     Home DM Meds: Janumet 50/500 2 tablets daily   Current Orders: Lantus  7 units daily      Novolog  5 units TID with meals      Novolog  Sensitive Correction Scale/ SSI (0-9 units) TID AC + HS    MD- Please consider:  1. Increase Lantus  to 10 units daily  2. Increase Novolog  Meal Coverage to 7 units TID with meals    --Will follow patient during hospitalization--  Adina Rudolpho Arrow RN, MSN, CDCES Diabetes Coordinator Inpatient Glycemic Control Team Team Pager: [phone] (8a-5p)

## 2024-01-03 NOTE — TOC Progression Note (Signed)
 Transition of Care [facility]) - Progression Note    Patient Details  Name: Jacqueline Arroyo MRN: [id number] Date of Birth: [birth year]  [facility] of [facility] CM/SW Contact  [name], RN Phone Number: 01/03/2024, 3:05 PM  Clinical Narrative:    Per CMA, patient plan is no longer managed by [facility]. Contacted Darrian at [facility] requesting that they get auth.  Spoke with patient's daughter and provided update.   Expected Discharge Plan: Home w Home Health Services Barriers to Discharge: Continued Medical Work up               Expected Discharge Plan and Services In-house Referral: Clinical Social Work Discharge Planning Services: CM Consult   Living arrangements for the past 2 months: Apartment                                       Social Drivers of Health (SDOH) Interventions SDOH Screenings   Food Insecurity: Patient Declined (12/22/2023)  Housing: Patient Declined (12/22/2023)  Transportation Needs: Patient Declined (12/22/2023)  Utilities: Patient Declined (12/22/2023)  Financial Resource Strain: Low Risk (12/16/2023)   Received from [facility]  Physical Activity: Insufficiently Active (06/20/2023)   Received from [facility]  Social Connections: Patient Declined (12/22/2023)  Stress: No Stress Concern Present (06/20/2023)   Received from [facility]  Tobacco Use: Low Risk (12/21/2023)  Health Literacy: Low Risk (06/20/2023)   Received from [facility]    Readmission Risk Interventions    12/31/2023    4:35 PM  Readmission Risk Prevention Plan  Transportation Screening Complete  PCP or Specialist Appt within 5-7 Days Complete  Home Care Screening Complete  Medication Review (RN CM) Complete

## 2024-01-03 NOTE — Progress Notes (Signed)
 " [facility]  ROUNDING NOTE   Subjective:   Patient seen laying in bed Daughter at bedside Appetite has improved  Sodium 131  Objective:  Vital signs in last 24 hours:  Temp:  [97.8 F (36.6 C)-98.5 F (36.9 C)] 98.4 F (36.9 C) (01/02 1205) Pulse Rate:  [81-103] 103 (01/02 1205) Resp:  [16-18] 16 (01/02 1205) BP: (105-125)/(47-63) 125/57 (01/02 1205) SpO2:  [92 %-96 %] 96 % (01/02 1205) Weight:  [78.7 kg] 78.7 kg (01/02 0500)  Weight change: 2.4 kg Filed Weights   01/01/24 0503 01/02/24 0500 01/03/24 0500  Weight: 72.2 kg 76.3 kg 78.7 kg    Intake/Output: I/O last 3 completed shifts: In: 720 [P.O.:720] Out: 1550 [Urine:1550]   Intake/Output this shift:  No intake/output data recorded.  Physical Exam: General: NAD  Head: Normocephalic, atraumatic. Moist oral mucosal membranes  Eyes: Anicteric  Lungs:  Clear to auscultation, normal effort  Heart: Regular rate and rhythm  Abdomen:  Soft, nontender  Extremities:  No peripheral edema.  Neurologic: Awake, alert, conversant  Skin: Warm,dry, no rash       Basic Metabolic Panel: Recent Labs  Lab 12/30/23 0433 12/31/23 0358 01/01/24 0436 01/02/24 0334 01/03/24 0827  NA 126* 128* 128* 129* 131*  K 4.4 4.6 3.9 4.0 4.0  CL 91* 93* 94* 96* 96*  CO2 20* 21* 22 22 24   GLUCOSE 183* 162* 159* 229* 198*  BUN 48* 56* 49* 45* 39*  CREATININE 1.18* 1.20* 1.08* 0.97 0.88  CALCIUM  8.9 8.8* 8.6* 8.4* 8.9  MG  --   --  2.1  --   --   PHOS  --   --  4.9*  --   --     Liver Function Tests: No results for input(s): AST, ALT, ALKPHOS, BILITOT, PROT, ALBUMIN  in the last 168 hours. No results for input(s): LIPASE, AMYLASE in the last 168 hours. No results for input(s): AMMONIA in the last 168 hours.  CBC: Recent Labs  Lab 12/28/23 0335 12/30/23 0433 01/01/24 0436 01/02/24 0334  WBC 9.0 9.8 8.3 7.9  HGB 10.3* 9.8* 9.6* 9.5*  HCT 31.6* 30.4* 29.5* 28.5*  MCV 108.2* 106.7* 107.3*  106.3*  PLT 219 225 276 290    Cardiac Enzymes: No results for input(s): CKTOTAL, CKMB, CKMBINDEX, TROPONINI in the last 168 hours.  BNP: Invalid input(s): POCBNP  CBG: Recent Labs  Lab 01/02/24 1209 01/02/24 1659 01/02/24 2058 01/03/24 0749 01/03/24 1205  GLUCAP 274* 211* 234* 200* 205*    Microbiology: Results for orders placed or performed during the hospital encounter of 12/21/23  Resp panel by RT-PCR (RSV, Flu A&B, Covid) Anterior Nasal Swab     Status: None   Collection Time: 12/21/23 12:33 AM   Specimen: Anterior Nasal Swab  Result Value Ref Range Status   SARS Coronavirus 2 by RT PCR NEGATIVE NEGATIVE Final    Comment: (NOTE) SARS-CoV-2 target nucleic acids are NOT DETECTED.  The SARS-CoV-2 RNA is generally detectable in upper respiratory specimens during the acute phase of infection. The lowest concentration of SARS-CoV-2 viral copies this assay can detect is 138 copies/mL. A negative result does not preclude SARS-Cov-2 infection and should not be used as the sole basis for treatment or other patient management decisions. A negative result may occur with  improper specimen collection/handling, submission of specimen other than nasopharyngeal swab, presence of viral mutation(s) within the areas targeted by this assay, and inadequate number of viral copies(<138 copies/mL). A negative result must be combined with  clinical observations, patient history, and epidemiological information. The expected result is Negative.  Fact Sheet for Patients:  bloggercourse.com  Fact Sheet for Healthcare Providers:  seriousbroker.it  This test is no t yet approved or cleared by the [place]  FDA and  has been authorized for detection and/or diagnosis of SARS-CoV-2 by FDA under an Emergency Use Authorization (EUA). This EUA will remain  in effect (meaning this test can be used) for the duration of the COVID-19  declaration under Section 564(b)(1) of the Act, 21 U.S.C.section 360bbb-3(b)(1), unless the authorization is terminated  or revoked sooner.       Influenza A by PCR NEGATIVE NEGATIVE Final   Influenza B by PCR NEGATIVE NEGATIVE Final    Comment: (NOTE) The Xpert Xpress SARS-CoV-2/FLU/RSV plus assay is intended as an aid in the diagnosis of influenza from Nasopharyngeal swab specimens and should not be used as a sole basis for treatment. Nasal washings and aspirates are unacceptable for Xpert Xpress SARS-CoV-2/FLU/RSV testing.  Fact Sheet for Patients: bloggercourse.com  Fact Sheet for Healthcare Providers: seriousbroker.it  This test is not yet approved or cleared by the [place]  FDA and has been authorized for detection and/or diagnosis of SARS-CoV-2 by FDA under an Emergency Use Authorization (EUA). This EUA will remain in effect (meaning this test can be used) for the duration of the COVID-19 declaration under Section 564(b)(1) of the Act, 21 U.S.C. section 360bbb-3(b)(1), unless the authorization is terminated or revoked.     Resp Syncytial Virus by PCR NEGATIVE NEGATIVE Final    Comment: (NOTE) Fact Sheet for Patients: bloggercourse.com  Fact Sheet for Healthcare Providers: seriousbroker.it  This test is not yet approved or cleared by the [place]  FDA and has been authorized for detection and/or diagnosis of SARS-CoV-2 by FDA under an Emergency Use Authorization (EUA). This EUA will remain in effect (meaning this test can be used) for the duration of the COVID-19 declaration under Section 564(b)(1) of the Act, 21 U.S.C. section 360bbb-3(b)(1), unless the authorization is terminated or revoked.  Performed at [facility], [address] [place], [place] [address]   Culture, blood (x 2)     Status: None   Collection Time: 12/21/23  6:52 AM    Specimen: BLOOD  Result Value Ref Range Status   Specimen Description BLOOD LEFT ANTECUBITAL  Final   Special Requests   Final    BOTTLES DRAWN AEROBIC AND ANAEROBIC Blood Culture adequate volume   Culture   Final    NO GROWTH 5 DAYS Performed at [facility], [address], [place], [place] [address]    Report Status 12/26/2023 FINAL  Final  Culture, blood (x 2)     Status: None   Collection Time: 12/21/23  7:10 AM   Specimen: BLOOD  Result Value Ref Range Status   Specimen Description BLOOD BLOOD RIGHT ARM  Final   Special Requests   Final    BOTTLES DRAWN AEROBIC AND ANAEROBIC Blood Culture adequate volume   Culture   Final    NO GROWTH 5 DAYS Performed at [facility], [address] [place], [place] [address]    Report Status 12/26/2023 FINAL  Final    Coagulation Studies: No results for input(s): LABPROT, INR in the last 72 hours.  Urinalysis: No results for input(s): COLORURINE, LABSPEC, PHURINE, GLUCOSEU, HGBUR, BILIRUBINUR, KETONESUR, PROTEINUR, UROBILINOGEN, NITRITE, LEUKOCYTESUR in the last 72 hours.  Invalid input(s): APPERANCEUR    Imaging: No results found.    Medications:  apixaban   10 mg Oral BID   Followed by   NOREEN ON 01/05/2024] apixaban   5 mg Oral BID   aspirin  EC  81 mg Oral Daily   atorvastatin   10 mg Oral Daily   cycloSPORINE   1 drop Both Eyes BID   feeding supplement  237 mL Oral BID BM   fluticasone  furoate-vilanterol  1 puff Inhalation Daily   hydroxyurea  500 mg Oral BID   insulin  aspart  0-5 Units Subcutaneous QHS   insulin  aspart  0-9 Units Subcutaneous TID WC   insulin  aspart  5 Units Subcutaneous TID WC   insulin  glargine  7 Units Subcutaneous Daily   lidocaine   1 patch Transdermal Q24H   LORazepam   0.25 mg Oral BID   metoprolol  succinate  50 mg Oral QHS   multivitamin with minerals  1 tablet Oral Daily   pantoprazole   40 mg Oral BID AC   thiamine  100 mg Oral Daily    torsemide  10 mg Oral Daily   acetaminophen  **OR** acetaminophen , bisacodyl , diclofenac  Sodium, guaiFENesin , ipratropium-albuterol , ondansetron  **OR** ondansetron  (ZOFRAN ) IV, polyethylene glycol, traMADol   Assessment/ Plan:  Ms. Jacqueline Arroyo is a 84 y.o.  female with past medical history of diabetes, hypertension, asthma, CHF and polycythemia vera, who was admitted to [facility] on 12/21/2023 for Chills [R68.83] CAP (community acquired pneumonia) [J18.9] Diabetic ketoacidosis without coma associated with type 2 diabetes mellitus (HCC) [E11.10] Community acquired pneumonia, unspecified laterality [J18.9] Sepsis, due to unspecified organism, unspecified whether acute organ dysfunction present (HCC) [A41.9]   Hyponatremia, likely secondary to pneumonia and volume overload, workup in progress. No tobacco history. Recent 2D echo shows LVEF 40 to 45% with mildly decreased LV function. Global hypokinesis Primary team has ordered IV furosemide , no improvement thus far with sodium levels.  Sodium 131. Patient and family encouraged to continue Ensure supplements and maintain appropriate diet. Maintain a 40oz fluid restriction.   Due to sodium correction, we will sign off at this time    LOS: 13 Jacqueline Arroyo 1/2/20263:33 PM   "

## 2024-01-03 NOTE — Plan of Care (Signed)

## 2024-01-03 NOTE — Progress Notes (Signed)
 Physical Therapy Treatment Patient Details Name: Jacqueline Arroyo MRN: [id number] DOB: 04-21-1940 Today's Date: 01/03/2024   History of Present Illness Pt is a pleasant 84 y.o. female with medical history significant for diabetes, HTN, HLD, asthma, polycythemia vera, HFrEF with last EF was around 35 to 40% from echo in 2025 July, CKD who presented to ED at [facility] for worsening shortness of breath cough and generalized weakness. MD assessment includes:  Possible CAP, asthma exacerbation, metapnemovirus, AKI.    PT Comments  Pt was pleasant and motivated to participate during the session and put forth good effort throughout. Pt required physical assistance with all functional tasks per below along with cuing for sequencing.  Pt was able to take several small shuffling steps laterally at the EOB and then from the bed to the chair but required min A for stability as well as to guide the RW.  Pt's SpO2 and HR were both WNL on 2L both at rest and after exertion.  Pt continues to present with significant deficits in functional strength and stability and is at a high risk for falls.  Pt will benefit from continued PT services upon discharge to safely address deficits listed in patient problem list for decreased caregiver assistance and eventual return to PLOF.      If plan is discharge home, recommend the following: Assistance with cooking/housework;Assist for transportation;Two people to help with walking and/or transfers;A lot of help with bathing/dressing/bathroom   Can travel by private vehicle     No  Equipment Recommendations  Other (comment) (TBD at next venue of care)    Recommendations for Other Services       Precautions / Restrictions Precautions Precautions: Fall Recall of Precautions/Restrictions: Intact Precaution/Restrictions Comments: L wrist splint for pain control Required Braces or Orthoses: Splint/Cast Splint/Cast: LUE/ wrist Restrictions Weight Bearing Restrictions Per Provider  Order: No Other Position/Activity Restrictions: Chronic bilateral ankle pain     Mobility  Bed Mobility Overal bed mobility: Needs Assistance Bed Mobility: Rolling, Sidelying to Sit Rolling: Min assist Sidelying to sit: Mod assist       General bed mobility comments: Mod A for BLE and trunk control with cues for log roll sequencing    Transfers Overall transfer level: Needs assistance Equipment used: Left platform walker Transfers: Sit to/from Stand Sit to Stand: Min assist, From elevated surface           General transfer comment: Mod verbal and tactile cues for proper sequencing and then min A to come to standing from an elevated EOB    Ambulation/Gait Ambulation/Gait assistance: Min assist, +2 safety/equipment Gait Distance (Feet): 3 Feet Assistive device: Left platform walker Gait Pattern/deviations: Antalgic, Decreased step length - right, Step-through pattern, Decreased step length - left, Shuffle, Trunk flexed Gait velocity: decreased     General Gait Details: Pt able to take several small, shuffling steps at the EOB and then from bed to chair with min A for stability and to guide the RW   Stairs             Wheelchair Mobility     Tilt Bed    Modified Rankin (Stroke Patients Only)       Balance Overall balance assessment: Needs assistance Sitting-balance support: Feet supported, Single extremity supported Sitting balance-Leahy Scale: Fair     Standing balance support: Bilateral upper extremity supported, During functional activity, Reliant on assistive device for balance Standing balance-Leahy Scale: Poor  Communication Communication Communication: No apparent difficulties  Cognition Arousal: Alert Behavior During Therapy: WFL for tasks assessed/performed, Flat affect   PT - Cognitive impairments: No apparent impairments                         Following commands: Intact       Cueing Cueing Techniques: Verbal cues, Tactile cues  Exercises Total Joint Exercises Ankle Circles/Pumps: AROM, Both, 10 reps Long Arc Quad: AROM, Strengthening, Both, 10 reps Knee Flexion: AROM, Strengthening, Both, 10 reps Other Exercises Other Exercises: Log roll training    General Comments        Pertinent Vitals/Pain Pain Assessment Pain Assessment: 0-10 Pain Score: 8  Pain Location: back and R shoulder Pain Descriptors / Indicators: Sore Pain Intervention(s): Premedicated before session, Monitored during session, Repositioned    Home Living                          Prior Function            PT Goals (current goals can now be found in the care plan section) Progress towards PT goals: Progressing toward goals    Frequency    Min 2X/week      PT Plan      Co-evaluation              AM-PAC PT 6 Clicks Mobility   Outcome Measure  Help needed turning from your back to your side while in a flat bed without using bedrails?: A Little Help needed moving from lying on your back to sitting on the side of a flat bed without using bedrails?: A Little Help needed moving to and from a bed to a chair (including a wheelchair)?: A Little Help needed standing up from a chair using your arms (e.g., wheelchair or bedside chair)?: A Little Help needed to walk in hospital room?: Total Help needed climbing 3-5 steps with a railing? : Total 6 Click Score: 14    End of Session Equipment Utilized During Treatment: Oxygen;Gait belt Activity Tolerance: Patient tolerated treatment well Patient left: in chair;with call bell/phone within reach;with chair alarm set Nurse Communication: Mobility status PT Visit Diagnosis: Difficulty in walking, not elsewhere classified (R26.2);Muscle weakness (generalized) (M62.81);Pain Pain - Right/Left: Right Pain - part of body: Shoulder (back and R shoulder)     Time: 8956-8894 PT Time Calculation (min) (ACUTE ONLY): 22  min  Charges:    $Therapeutic Activity: 8-22 mins PT General Charges $$ ACUTE PT VISIT: 1 Visit                    D. Scott Keeli Roberg PT, DPT 01/03/2024, 12:25 PM

## 2024-01-03 NOTE — Progress Notes (Signed)
 "    Progress Note    Jacqueline Arroyo  FMW:[id number] DOB: 30-Jan-1940  DOA: 12/21/2023 PCP: [facility]      Brief Narrative:    Medical records reviewed and are as summarized below:  Jacqueline Arroyo is a 84 y.o. female  with medical history significant for mild on surveillance, diabetes, HTN, HLD, asthma, polycythemia vera, HFrEF with last EF was around 35 to 40% from echo in 2025 July, CKD who presented to ED at [facility] for worsening shortness of breath cough and generalized weakness.  Patient had a recent hospitalization at [facility] and was discharged from the hospital on 12/16/2023 after being diagnosed with bronchitis and exacerbation of asthma.  She presented on 12/19  with worsening respiratory symptoms after she went home along with some chills, generalized weakness and worsening nausea.  Admitted for possible community-acquired pneumonia, asthma exacerbation, metapneumovirus infection.       Assessment/Plan:   Principal Problem:   CAP (community acquired pneumonia) Active Problems:   HFrEF (heart failure with reduced ejection fraction) (HCC)   HTN (hypertension)   HLD (hyperlipidemia)   DM (diabetes mellitus) (HCC)   Asthma, chronic   Polycythemia vera (HCC)   Nutrition Problem: Inadequate oral intake Etiology: acute illness  Signs/Symptoms: meal completion < 25%   Body mass index is 33.88 kg/m.   Possible CAP  Asthma exacerbation  Metapnemovirus + Chronic hypoxic respiratory failure - 2L Loop Patient recently treated at [facility] for asthma exacerbation and viral bronchitis (metapneumovirus +). She was discharged with albuterol  and steroid course Presented with worsening SOB, elevated D-dimer, VQ neg for PE. CXR with small opacity adjacent to elevated diaphragm.  Completed abx course, prednisone  x 5 days Home inhalers, DuoNebs as needed    Left popliteal vein DVT Suspect due to immobility elevated D-dimer, VQ neg for PE US  lower extremity shows  nonocclusive acute DVT in left popliteal vein On Eliquis  10 mg twice daily, is allergic to heparin /Lovenox     Acute on chronic HFrEF Echo shows EF 40 to 45%, LV global hypokinesis.  LA moderately dilated, trivial MR. Continue low-dose torsemide and metoprolol .   S/p treatment with IV Lasix . 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  Unclear etiology Continue fluid restriction    T2DM  Steroid-induced hyperglycemia HbA1c 7.8 On NovoLog  5 units tid, SSI. Completed prednisone  Increase Lantus  from 7 units to 10 units daily    H/o Polycythemia vera  Hgb around 9.5. Noted to be 12-13 during recent hospitalization. No obvious bleeding noted, denies active bleeding/melena Iron panel consistent with ACD Resume hydroxyurea PPI BID   Left ankle osteoarthritis Has chronic pain XR ankle - no fracture Change oxycodone  to Tramadol , Topical diclofenac  gel    Left wrist pain, left shoulder pain XR Left wrist no acute fracture History right shoulder showed mild acromioclavicular degenerative change, mild subcortical cystic change in the lateral humeral head. Analgesics as needed for pain.    Constipation Laxatives as needed     Comorbidities include hypertension, hyperlipidemia      Diet Order             Diet Carb Modified Room service appropriate? Yes; Fluid restriction: 1500 mL Fluid  Diet effective now  Consultants: Nephrologist  Procedures: None    Medications:    apixaban   10 mg Oral BID   Followed by   NOREEN ON 01/05/2024] apixaban   5 mg Oral BID   aspirin  EC  81 mg Oral Daily   atorvastatin   10 mg Oral Daily   cycloSPORINE   1 drop Both Eyes BID   feeding supplement  237 mL Oral BID BM   fluticasone  furoate-vilanterol  1 puff Inhalation Daily   hydroxyurea  500 mg  Oral BID   insulin  aspart  0-5 Units Subcutaneous QHS   insulin  aspart  0-9 Units Subcutaneous TID WC   insulin  aspart  5 Units Subcutaneous TID WC   [START ON 01/04/2024] insulin  glargine  10 Units Subcutaneous Daily   lidocaine   1 patch Transdermal Q24H   LORazepam   0.25 mg Oral BID   metoprolol  succinate  50 mg Oral QHS   multivitamin with minerals  1 tablet Oral Daily   pantoprazole   40 mg Oral BID AC   thiamine  100 mg Oral Daily   torsemide  10 mg Oral Daily   Continuous Infusions:   Anti-infectives (From admission, onward)    Start     Dose/Rate Route Frequency Ordered Stop   12/24/23 1000  azithromycin  (ZITHROMAX ) tablet 500 mg        500 mg Oral Daily 12/23/23 1220 12/25/23 1001   12/21/23 0745  cefTRIAXone  (ROCEPHIN ) 2 g in sodium chloride  0.9 % 100 mL IVPB  Status:  Discontinued        2 g 200 mL/hr over 30 Minutes Intravenous Every 24 hours 12/21/23 0744 12/21/23 0746   12/21/23 0745  azithromycin  (ZITHROMAX ) 500 mg in sodium chloride  0.9 % 250 mL IVPB  Status:  Discontinued        500 mg 250 mL/hr over 60 Minutes Intravenous Every 24 hours 12/21/23 0744 12/21/23 0746   12/21/23 0630  cefTRIAXone  (ROCEPHIN ) 2 g in sodium chloride  0.9 % 100 mL IVPB        2 g 200 mL/hr over 30 Minutes Intravenous Every 24 hours 12/21/23 0629 12/25/23 9386   12/21/23 0630  azithromycin  (ZITHROMAX ) 500 mg in sodium chloride  0.9 % 250 mL IVPB  Status:  Discontinued        500 mg 250 mL/hr over 60 Minutes Intravenous Every 24 hours 12/21/23 0629 12/23/23 1220              Family Communication/Anticipated D/C date and plan/Code Status   DVT prophylaxis: SCDs Start: 12/21/23 0740 apixaban  (ELIQUIS ) tablet 10 mg  apixaban  (ELIQUIS ) tablet 5 mg     Code Status: Limited: Do not attempt resuscitation (DNR) -DNR-LIMITED -Do Not Intubate/DNI   Family Communication: None Disposition Plan: Plan to discharge to SNF   Status is: Inpatient Remains inpatient appropriate because:  Awaiting placement to SNF       Subjective:   No acute events reported.  No complaints.  She feels better.  Objective:    Vitals:   01/03/24 0342 01/03/24 0500 01/03/24 0750 01/03/24 1205  BP: (!) 105/48  117/63 (!) 125/57  Pulse: 86  81 (!) 103  Resp: 17  16 16   Temp: 98.4 F (36.9 C)  97.8 F (36.6 C) 98.4 F (36.9 C)  TempSrc: Oral     SpO2: 96%  95% 96%  Weight:  78.7 kg    Height:       No data found.  No intake or output data in the 24 hours ending 01/03/24 1534  Filed Weights   01/01/24 0503 01/02/24 0500 01/03/24 0500  Weight: 72.2 kg 76.3 kg 78.7 kg    Exam:   GEN: NAD SKIN: Warm and dry EYES: No pallor or icterus ENT: MMM CV: RRR PULM: CTA B ABD: soft, ND, NT, +BS CNS: AAO x 3, non focal EXT: No edema or tenderness      Data Reviewed:   I have personally reviewed following labs and imaging studies:  Labs: Labs show the following:   Basic Metabolic Panel: Recent Labs  Lab 12/30/23 0433 12/31/23 0358 01/01/24 0436 01/02/24 0334 01/03/24 0827  NA 126* 128* 128* 129* 131*  K 4.4 4.6 3.9 4.0 4.0  CL 91* 93* 94* 96* 96*  CO2 20* 21* 22 22 24   GLUCOSE 183* 162* 159* 229* 198*  BUN 48* 56* 49* 45* 39*  CREATININE 1.18* 1.20* 1.08* 0.97 0.88  CALCIUM  8.9 8.8* 8.6* 8.4* 8.9  MG  --   --  2.1  --   --   PHOS  --   --  4.9*  --   --    GFR Estimated Creatinine Clearance: 43.4 mL/min (by C-G formula based on SCr of 0.88 mg/dL). Liver Function Tests: No results for input(s): AST, ALT, ALKPHOS, BILITOT, PROT, ALBUMIN  in the last 168 hours. No results for input(s): LIPASE, AMYLASE in the last 168 hours. No results for input(s): AMMONIA in the last 168 hours. Coagulation profile No results for input(s): INR, PROTIME in the last 168 hours.  CBC: Recent Labs  Lab 12/28/23 0335 12/30/23 0433 01/01/24 0436 01/02/24 0334  WBC 9.0 9.8 8.3 7.9  HGB 10.3* 9.8* 9.6* 9.5*  HCT 31.6* 30.4* 29.5* 28.5*  MCV 108.2*  106.7* 107.3* 106.3*  PLT 219 225 276 290   Cardiac Enzymes: No results for input(s): CKTOTAL, CKMB, CKMBINDEX, TROPONINI in the last 168 hours. BNP (last 3 results) Recent Labs    12/22/23 1247 12/25/23 1343  PROBNP 418.0* 6,107.0*   CBG: Recent Labs  Lab 01/02/24 1209 01/02/24 1659 01/02/24 2058 01/03/24 0749 01/03/24 1205  GLUCAP 274* 211* 234* 200* 205*   D-Dimer: No results for input(s): DDIMER in the last 72 hours. Hgb A1c: No results for input(s): HGBA1C in the last 72 hours. Lipid Profile: No results for input(s): CHOL, HDL, LDLCALC, TRIG, CHOLHDL, LDLDIRECT in the last 72 hours. Thyroid function studies: No results for input(s): TSH, T4TOTAL, T3FREE, THYROIDAB in the last 72 hours.  Invalid input(s): FREET3 Anemia work up: No results for input(s): VITAMINB12, FOLATE, FERRITIN, TIBC, IRON, RETICCTPCT in the last 72 hours. Sepsis Labs: Recent Labs  Lab 12/28/23 0335 12/30/23 0433 01/01/24 0436 01/02/24 0334  WBC 9.0 9.8 8.3 7.9    Microbiology No results found for this or any previous visit (from the past 240 hours).  Procedures and diagnostic studies:  No results found.              LOS: 13 days   Leah Skora  [employer] Adjuster on [email or web address]. If 7PM-7AM, please contact night-coverage at [email or web address]     01/03/2024, 3:34 PM           "

## 2024-01-04 DIAGNOSIS — J189 Pneumonia, unspecified organism: Secondary | ICD-10-CM | POA: Diagnosis not present

## 2024-01-04 LAB — GLUCOSE, CAPILLARY
Glucose-Capillary: 205 mg/dL — ABNORMAL HIGH (ref 70–99)
Glucose-Capillary: 233 mg/dL — ABNORMAL HIGH (ref 70–99)
Glucose-Capillary: 223 mg/dL — ABNORMAL HIGH (ref 70–99)

## 2024-01-04 NOTE — Progress Notes (Signed)
 "    Progress Note    Jacqueline Arroyo  FMW:[id number] DOB: Jun 29, 1940  DOA: 12/21/2023 PCP: [facility]      Brief Narrative:    Medical records reviewed and are as summarized below:  Jacqueline Arroyo is a 84 y.o. female  with medical history significant for mild on surveillance, diabetes, HTN, HLD, asthma, polycythemia vera, HFrEF with last EF was around 35 to 40% from echo in 2025 July, CKD who presented to ED at [facility] for worsening shortness of breath cough and generalized weakness.  Patient had a recent hospitalization at [facility] and was discharged from the hospital on 12/16/2023 after being diagnosed with bronchitis and exacerbation of asthma.  She presented on 12/19  with worsening respiratory symptoms after she went home along with some chills, generalized weakness and worsening nausea.  Admitted for possible community-acquired pneumonia, asthma exacerbation, metapneumovirus infection.       Assessment/Plan:   Principal Problem:   CAP (community acquired pneumonia) Active Problems:   HFrEF (heart failure with reduced ejection fraction) (HCC)   HTN (hypertension)   HLD (hyperlipidemia)   DM (diabetes mellitus) (HCC)   Asthma, chronic   Polycythemia vera (HCC)   Nutrition Problem: Inadequate oral intake Etiology: acute illness  Signs/Symptoms: meal completion < 25%   Body mass index is 33.07 kg/m.   Possible CAP  Asthma exacerbation  Metapnemovirus + Chronic hypoxic respiratory failure - 2L [place] Patient recently treated at [facility] for asthma exacerbation and viral bronchitis (metapneumovirus +). She was discharged with albuterol  and steroid course Presented with worsening SOB, elevated D-dimer, VQ neg for PE. CXR with small opacity adjacent to elevated diaphragm.  Completed abx course, prednisone  x 5 days Continue bronchodilators as needed    Left popliteal vein DVT Suspect due to immobility elevated D-dimer, VQ neg for PE US  lower extremity  shows nonocclusive acute DVT in left popliteal vein Continue Eliquis .  She is allergic to heparin /Lovenox     Acute on chronic HFrEF Echo shows EF 40 to 45%, LV global hypokinesis.  LA moderately dilated, trivial MR. Continue low-dose torsemide  and metoprolol .   S/p treatment with IV Lasix . 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  Cr peaked 1.60   Hyponatremia Sodium slowly increased from 126-129-131.  Unclear etiology Continue fluid restriction    T2DM  Steroid-induced hyperglycemia HbA1c 7.8 On NovoLog  5 units tid, SSI. Completed prednisone  Continue Lantus  at 10 units daily.    H/o Polycythemia vera  Hgb around 9.5. Noted to be 12-13 during recent hospitalization. No obvious bleeding noted, denies active bleeding/melena Iron panel consistent with ACD Resume hydroxyurea  PPI BID   Left ankle osteoarthritis Has chronic pain XR ankle - no fracture Change oxycodone  to Tramadol , Topical diclofenac  gel    Left wrist pain, left shoulder pain XR Left wrist no acute fracture History right shoulder showed mild acromioclavicular degenerative change, mild subcortical cystic change in the lateral humeral head. Analgesics as needed for pain.    Constipation Laxatives as needed     Comorbidities include hypertension, hyperlipidemia   Awaiting placement to SNF   Diet Order             Diet Carb Modified Room service appropriate? Yes; Fluid restriction: 1500 mL Fluid  Diet effective now  Consultants: Nephrologist  Procedures: None    Medications:    apixaban   10 mg Oral BID   Followed by   NOREEN ON 01/05/2024] apixaban   5 mg Oral BID   aspirin  EC  81 mg Oral Daily   atorvastatin   10 mg Oral Daily   cycloSPORINE   1 drop Both Eyes BID   feeding supplement  237 mL Oral BID BM   fluticasone  furoate-vilanterol  1 puff Inhalation Daily   hydroxyurea   500 mg Oral BID   insulin   aspart  0-5 Units Subcutaneous QHS   insulin  aspart  0-9 Units Subcutaneous TID WC   insulin  aspart  5 Units Subcutaneous TID WC   insulin  glargine  10 Units Subcutaneous Daily   lidocaine   1 patch Transdermal Q24H   LORazepam   0.25 mg Oral BID   metoprolol  succinate  50 mg Oral QHS   multivitamin with minerals  1 tablet Oral Daily   pantoprazole   40 mg Oral BID AC   thiamine   100 mg Oral Daily   torsemide   10 mg Oral Daily   Continuous Infusions:   Anti-infectives (From admission, onward)    Start     Dose/Rate Route Frequency Ordered Stop   12/24/23 1000  azithromycin  (ZITHROMAX ) tablet 500 mg        500 mg Oral Daily 12/23/23 1220 12/25/23 1001   12/21/23 0745  cefTRIAXone  (ROCEPHIN ) 2 g in sodium chloride  0.9 % 100 mL IVPB  Status:  Discontinued        2 g 200 mL/hr over 30 Minutes Intravenous Every 24 hours 12/21/23 0744 12/21/23 0746   12/21/23 0745  azithromycin  (ZITHROMAX ) 500 mg in sodium chloride  0.9 % 250 mL IVPB  Status:  Discontinued        500 mg 250 mL/hr over 60 Minutes Intravenous Every 24 hours 12/21/23 0744 12/21/23 0746   12/21/23 0630  cefTRIAXone  (ROCEPHIN ) 2 g in sodium chloride  0.9 % 100 mL IVPB        2 g 200 mL/hr over 30 Minutes Intravenous Every 24 hours 12/21/23 0629 12/25/23 9386   12/21/23 0630  azithromycin  (ZITHROMAX ) 500 mg in sodium chloride  0.9 % 250 mL IVPB  Status:  Discontinued        500 mg 250 mL/hr over 60 Minutes Intravenous Every 24 hours 12/21/23 0629 12/23/23 1220              Family Communication/Anticipated D/C date and plan/Code Status   DVT prophylaxis: SCDs Start: 12/21/23 0740 apixaban  (ELIQUIS ) tablet 10 mg  apixaban  (ELIQUIS ) tablet 5 mg     Code Status: Limited: Do not attempt resuscitation (DNR) -DNR-LIMITED -Do Not Intubate/DNI   Family Communication: None Disposition Plan: Plan to discharge to SNF   Status is: Inpatient Remains inpatient appropriate because: Awaiting placement to  SNF       Subjective:   Interval events noted.  She has no complaints.  No shortness of breath or chest pain.  She feels better.  Objective:    Vitals:   01/04/24 0348 01/04/24 0500 01/04/24 0837 01/04/24 1326  BP: 117/60  (!) 101/49 (!) 111/59  Pulse: 98  100 97  Resp: 20  17 16   Temp: 98.3 F (36.8 C)  98.2 F (36.8 C) 97.7 F (36.5 C)  TempSrc:      SpO2: 92%  91% 96%  Weight:  76.8 kg    Height:       No data found.  No intake or output data in the  24 hours ending 01/04/24 1535  Filed Weights   01/02/24 0500 01/03/24 0500 01/04/24 0500  Weight: 76.3 kg 78.7 kg 76.8 kg    Exam:  GEN: NAD SKIN: Warm and dry EYES: No pallor or icterus ENT: MMM CV: RRR PULM: CTA B ABD: soft, ND, NT, +BS CNS: AAO x 3, non focal EXT: No leg swelling, redness or tenderness    Data Reviewed:   I have personally reviewed following labs and imaging studies:  Labs: Labs show the following:   Basic Metabolic Panel: Recent Labs  Lab 12/30/23 0433 12/31/23 0358 01/01/24 0436 01/02/24 0334 01/03/24 0827  NA 126* 128* 128* 129* 131*  K 4.4 4.6 3.9 4.0 4.0  CL 91* 93* 94* 96* 96*  CO2 20* 21* 22 22 24   GLUCOSE 183* 162* 159* 229* 198*  BUN 48* 56* 49* 45* 39*  CREATININE 1.18* 1.20* 1.08* 0.97 0.88  CALCIUM  8.9 8.8* 8.6* 8.4* 8.9  MG  --   --  2.1  --   --   PHOS  --   --  4.9*  --   --    GFR Estimated Creatinine Clearance: 42.8 mL/min (by C-G formula based on SCr of 0.88 mg/dL). Liver Function Tests: No results for input(s): AST, ALT, ALKPHOS, BILITOT, PROT, ALBUMIN  in the last 168 hours. No results for input(s): LIPASE, AMYLASE in the last 168 hours. No results for input(s): AMMONIA in the last 168 hours. Coagulation profile No results for input(s): INR, PROTIME in the last 168 hours.  CBC: Recent Labs  Lab 12/30/23 0433 01/01/24 0436 01/02/24 0334  WBC 9.8 8.3 7.9  HGB 9.8* 9.6* 9.5*  HCT 30.4* 29.5* 28.5*  MCV 106.7* 107.3*  106.3*  PLT 225 276 290   Cardiac Enzymes: No results for input(s): CKTOTAL, CKMB, CKMBINDEX, TROPONINI in the last 168 hours. BNP (last 3 results) Recent Labs    12/22/23 1247 12/25/23 1343  PROBNP 418.0* 6,107.0*   CBG: Recent Labs  Lab 01/03/24 1205 01/03/24 1624 01/03/24 1948 01/04/24 0832 01/04/24 1235  GLUCAP 205* 204* 187* 205* 233*   D-Dimer: No results for input(s): DDIMER in the last 72 hours. Hgb A1c: No results for input(s): HGBA1C in the last 72 hours. Lipid Profile: No results for input(s): CHOL, HDL, LDLCALC, TRIG, CHOLHDL, LDLDIRECT in the last 72 hours. Thyroid function studies: No results for input(s): TSH, T4TOTAL, T3FREE, THYROIDAB in the last 72 hours.  Invalid input(s): FREET3 Anemia work up: No results for input(s): VITAMINB12, FOLATE, FERRITIN, TIBC, IRON, RETICCTPCT in the last 72 hours. Sepsis Labs: Recent Labs  Lab 12/30/23 0433 01/01/24 0436 01/02/24 0334  WBC 9.8 8.3 7.9    Microbiology No results found for this or any previous visit (from the past 240 hours).  Procedures and diagnostic studies:  No results found.              LOS: 14 days   Lucilia Yanni  [employer]er on [email or web address]. If 7PM-7AM, please contact night-coverage at [email or web address]     01/04/2024, 3:35 PM           "

## 2024-01-04 NOTE — Plan of Care (Signed)
" °  Problem: Fluid Volume: Goal: Hemodynamic stability will improve Outcome: Progressing   Problem: Clinical Measurements: Goal: Diagnostic test results will improve Outcome: Progressing Goal: Signs and symptoms of infection will decrease Outcome: Progressing   Problem: Respiratory: Goal: Ability to maintain adequate ventilation will improve Outcome: Progressing   Problem: Education: Goal: Ability to describe self-care measures that may prevent or decrease complications (Diabetes Survival Skills Education) will improve Outcome: Progressing Goal: Individualized Educational Video(s) Outcome: Progressing   Problem: Coping: Goal: Ability to adjust to condition or change in health will improve Outcome: Progressing   Problem: Fluid Volume: Goal: Ability to maintain a balanced intake and output will improve Outcome: Progressing   Problem: Health Behavior/Discharge Planning: Goal: Ability to identify and utilize available resources and services will improve Outcome: Progressing Goal: Ability to manage health-related needs will improve Outcome: Progressing   Problem: Metabolic: Goal: Ability to maintain appropriate glucose levels will improve Outcome: Progressing   Problem: Nutritional: Goal: Maintenance of adequate nutrition will improve Outcome: Progressing Goal: Progress toward achieving an optimal weight will improve Outcome: Progressing   Problem: Skin Integrity: Goal: Risk for impaired skin integrity will decrease Outcome: Progressing   Problem: Tissue Perfusion: Goal: Adequacy of tissue perfusion will improve Outcome: Progressing   Problem: Education: Goal: Knowledge of General Education information will improve Description: Including pain rating scale, medication(s)/side effects and non-pharmacologic comfort measures Outcome: Progressing   Problem: Health Behavior/Discharge Planning: Goal: Ability to manage health-related needs will improve Outcome:  Progressing   Problem: Clinical Measurements: Goal: Ability to maintain clinical measurements within normal limits will improve Outcome: Progressing Goal: Will remain free from infection Outcome: Progressing Goal: Diagnostic test results will improve Outcome: Progressing Goal: Respiratory complications will improve Outcome: Progressing Goal: Cardiovascular complication will be avoided Outcome: Progressing   Problem: Activity: Goal: Risk for activity intolerance will decrease Outcome: Progressing   Problem: Nutrition: Goal: Adequate nutrition will be maintained Outcome: Progressing   Problem: Coping: Goal: Level of anxiety will decrease Outcome: Progressing   Problem: Elimination: Goal: Will not experience complications related to bowel motility Outcome: Progressing Goal: Will not experience complications related to urinary retention Outcome: Progressing   Problem: Pain Managment: Goal: General experience of comfort will improve and/or be controlled Outcome: Progressing   Problem: Safety: Goal: Ability to remain free from injury will improve Outcome: Progressing   Problem: Skin Integrity: Goal: Risk for impaired skin integrity will decrease Outcome: Progressing   Problem: Activity: Goal: Ability to tolerate increased activity will improve Outcome: Progressing   Problem: Clinical Measurements: Goal: Ability to maintain a body temperature in the normal range will improve Outcome: Progressing   Problem: Respiratory: Goal: Ability to maintain adequate ventilation will improve Outcome: Progressing Goal: Ability to maintain a clear airway will improve Outcome: Progressing   "

## 2024-01-05 DIAGNOSIS — J189 Pneumonia, unspecified organism: Secondary | ICD-10-CM | POA: Diagnosis not present

## 2024-01-05 LAB — GLUCOSE, CAPILLARY
Glucose-Capillary: 178 mg/dL — ABNORMAL HIGH (ref 70–99)
Glucose-Capillary: 238 mg/dL — ABNORMAL HIGH (ref 70–99)
Glucose-Capillary: 367 mg/dL — ABNORMAL HIGH (ref 70–99)
Glucose-Capillary: 238 mg/dL — ABNORMAL HIGH (ref 70–99)

## 2024-01-05 NOTE — Plan of Care (Signed)
" °  Problem: Fluid Volume: Goal: Hemodynamic stability will improve Outcome: Progressing   Problem: Clinical Measurements: Goal: Diagnostic test results will improve Outcome: Progressing Goal: Signs and symptoms of infection will decrease Outcome: Progressing   Problem: Respiratory: Goal: Ability to maintain adequate ventilation will improve Outcome: Progressing   Problem: Education: Goal: Ability to describe self-care measures that may prevent or decrease complications (Diabetes Survival Skills Education) will improve Outcome: Progressing Goal: Individualized Educational Video(s) Outcome: Progressing   Problem: Coping: Goal: Ability to adjust to condition or change in health will improve Outcome: Progressing   Problem: Fluid Volume: Goal: Ability to maintain a balanced intake and output will improve Outcome: Progressing   Problem: Health Behavior/Discharge Planning: Goal: Ability to identify and utilize available resources and services will improve Outcome: Progressing Goal: Ability to manage health-related needs will improve Outcome: Progressing   Problem: Metabolic: Goal: Ability to maintain appropriate glucose levels will improve Outcome: Progressing   Problem: Nutritional: Goal: Maintenance of adequate nutrition will improve Outcome: Progressing Goal: Progress toward achieving an optimal weight will improve Outcome: Progressing   Problem: Skin Integrity: Goal: Risk for impaired skin integrity will decrease Outcome: Progressing   Problem: Tissue Perfusion: Goal: Adequacy of tissue perfusion will improve Outcome: Progressing   Problem: Education: Goal: Knowledge of General Education information will improve Description: Including pain rating scale, medication(s)/side effects and non-pharmacologic comfort measures Outcome: Progressing   Problem: Health Behavior/Discharge Planning: Goal: Ability to manage health-related needs will improve Outcome:  Progressing   Problem: Clinical Measurements: Goal: Ability to maintain clinical measurements within normal limits will improve Outcome: Progressing Goal: Will remain free from infection Outcome: Progressing Goal: Diagnostic test results will improve Outcome: Progressing Goal: Respiratory complications will improve Outcome: Progressing Goal: Cardiovascular complication will be avoided Outcome: Progressing   Problem: Activity: Goal: Risk for activity intolerance will decrease Outcome: Progressing   Problem: Nutrition: Goal: Adequate nutrition will be maintained Outcome: Progressing   Problem: Coping: Goal: Level of anxiety will decrease Outcome: Progressing   Problem: Elimination: Goal: Will not experience complications related to bowel motility Outcome: Progressing Goal: Will not experience complications related to urinary retention Outcome: Progressing   Problem: Pain Managment: Goal: General experience of comfort will improve and/or be controlled Outcome: Progressing   Problem: Safety: Goal: Ability to remain free from injury will improve Outcome: Progressing   Problem: Skin Integrity: Goal: Risk for impaired skin integrity will decrease Outcome: Progressing   Problem: Activity: Goal: Ability to tolerate increased activity will improve Outcome: Progressing   Problem: Clinical Measurements: Goal: Ability to maintain a body temperature in the normal range will improve Outcome: Progressing   Problem: Respiratory: Goal: Ability to maintain adequate ventilation will improve Outcome: Progressing Goal: Ability to maintain a clear airway will improve Outcome: Progressing   "

## 2024-01-05 NOTE — Progress Notes (Signed)
 "    Progress Note    Jacqueline Arroyo  FMW:[id number] DOB: 1940-05-17  DOA: 12/21/2023 [facility]CP: [facility]      Brief Narrative:    Medical records reviewed and are as summarized below:  Jacqueline Arroyo is a 84 y.o. female  with medical history significant for mild on surveillance, diabetes, HTN, HLD, asthma, polycythemia vera, HFrEF with last EF was around 35 to 40% from echo in 2025 July, CKD who presented to ED at [facility] for worsening shortness of breath cough and generalized weakness.  Patient had a recent hospitalization at [facility] and was discharged from the hospital on 12/16/2023 after being diagnosed with bronchitis and exacerbation of asthma.  She presented on 12/19  with worsening respiratory symptoms after she went home along with some chills, generalized weakness and worsening nausea.  Admitted for possible community-acquired pneumonia, asthma exacerbation, metapneumovirus infection.       Assessment/Plan:   Principal Problem:   CAP (community acquired pneumonia) Active Problems:   HFrEF (heart failure with reduced ejection fraction) (HCC)   HTN (hypertension)   HLD (hyperlipidemia)   DM (diabetes mellitus) (HCC)   Asthma, chronic   Polycythemia vera (HCC)   Nutrition Problem: Inadequate oral intake Etiology: acute illness  Signs/Symptoms: meal completion < 25%   Body mass index is 33.58 kg/m.   Possible CAP  Asthma exacerbation  Metapnemovirus + Chronic hypoxic respiratory failure - 2L [place] Patient recently treated at [facility] for asthma exacerbation and viral bronchitis (metapneumovirus +). She was discharged with albuterol  and steroid course Presented with worsening SOB, elevated D-dimer, VQ neg for PE. CXR with small opacity adjacent to elevated diaphragm.  Completed abx course, prednisone  x 5 days Continue bronchodilators as needed    Left popliteal vein DVT Suspect due to immobility elevated D-dimer, VQ neg for PE US  lower extremity  shows nonocclusive acute DVT in left popliteal vein Continue Eliquis .  She is allergic to heparin /Lovenox     Acute on chronic HFrEF Echo shows EF 40 to 45%, LV global hypokinesis.  LA moderately dilated, trivial MR. Continue low-dose torsemide  and metoprolol .   S/p treatment with IV Lasix . 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  Cr peaked 1.60   Hyponatremia Improved.  Sodium slowly increased from 126-129-131.  Unclear etiology Continue fluid restriction    T2DM  Steroid-induced hyperglycemia HbA1c 7.8 On NovoLog  5 units tid, SSI. Completed prednisone  Continue Lantus  at 10 units daily.    H/o Polycythemia vera  Hgb around 9.5. Noted to be 12-13 during recent hospitalization. No obvious bleeding noted, denies active bleeding/melena Iron panel consistent with ACD Resume hydroxyurea  PPI BID   Left ankle osteoarthritis Has chronic pain XR ankle - no fracture Change oxycodone  to Tramadol , Topical diclofenac  gel    Left wrist pain, left shoulder pain XR Left wrist no acute fracture History right shoulder showed mild acromioclavicular degenerative change, mild subcortical cystic change in the lateral humeral head. Analgesics as needed for pain.    Constipation Laxatives as needed     Comorbidities include hypertension, hyperlipidemia   Awaiting placement to SNF   Diet Order             Diet Carb Modified Room service appropriate? Yes; Fluid restriction: 1500 mL Fluid  Diet effective now  Consultants: Nephrologist  Procedures: None    Medications:    apixaban   5 mg Oral BID   aspirin  EC  81 mg Oral Daily   atorvastatin   10 mg Oral Daily   cycloSPORINE   1 drop Both Eyes BID   feeding supplement  237 mL Oral BID BM   fluticasone  furoate-vilanterol  1 puff Inhalation Daily   hydroxyurea   500 mg Oral BID   insulin  aspart  0-5 Units Subcutaneous QHS   insulin  aspart  0-9  Units Subcutaneous TID WC   insulin  aspart  5 Units Subcutaneous TID WC   insulin  glargine  10 Units Subcutaneous Daily   lidocaine   1 patch Transdermal Q24H   LORazepam   0.25 mg Oral BID   metoprolol  succinate  50 mg Oral QHS   multivitamin with minerals  1 tablet Oral Daily   pantoprazole   40 mg Oral BID AC   thiamine   100 mg Oral Daily   torsemide   10 mg Oral Daily   Continuous Infusions:   Anti-infectives (From admission, onward)    Start     Dose/Rate Route Frequency Ordered Stop   12/24/23 1000  azithromycin  (ZITHROMAX ) tablet 500 mg        500 mg Oral Daily 12/23/23 1220 12/25/23 1001   12/21/23 0745  cefTRIAXone  (ROCEPHIN ) 2 g in sodium chloride  0.9 % 100 mL IVPB  Status:  Discontinued        2 g 200 mL/hr over 30 Minutes Intravenous Every 24 hours 12/21/23 0744 12/21/23 0746   12/21/23 0745  azithromycin  (ZITHROMAX ) 500 mg in sodium chloride  0.9 % 250 mL IVPB  Status:  Discontinued        500 mg 250 mL/hr over 60 Minutes Intravenous Every 24 hours 12/21/23 0744 12/21/23 0746   12/21/23 0630  cefTRIAXone  (ROCEPHIN ) 2 g in sodium chloride  0.9 % 100 mL IVPB        2 g 200 mL/hr over 30 Minutes Intravenous Every 24 hours 12/21/23 0629 12/25/23 9386   12/21/23 0630  azithromycin  (ZITHROMAX ) 500 mg in sodium chloride  0.9 % 250 mL IVPB  Status:  Discontinued        500 mg 250 mL/hr over 60 Minutes Intravenous Every 24 hours 12/21/23 0629 12/23/23 1220              Family Communication/Anticipated D/C date and plan/Code Status   DVT prophylaxis: SCDs Start: 12/21/23 0740 apixaban  (ELIQUIS ) tablet 5 mg     Code Status: Limited: Do not attempt resuscitation (DNR) -DNR-LIMITED -Do Not Intubate/DNI   Family Communication: None Disposition Plan: Plan to discharge to SNF   Status is: Inpatient Remains inpatient appropriate because: Awaiting placement to SNF       Subjective:   No complaints.  She feels okay.  Objective:    Vitals:   01/05/24 0452  01/05/24 0500 01/05/24 0927 01/05/24 1015  BP: (!) 106/54  (!) 108/50 (!) 112/54  Pulse: 81  86 90  Resp: 20  18 18   Temp: 98 F (36.7 C)  97.7 F (36.5 C) 97.8 F (36.6 C)  TempSrc:   Oral   SpO2: 96%  95% 93%  Weight:  78 kg    Height:       No data found.   Intake/Output Summary (Last 24 hours) at 01/05/2024 1420 Last data filed at 01/05/2024 1300 Gross per 24 hour  Intake 540 ml  Output 350 ml  Net 190 ml    Filed Weights   01/03/24 0500 01/04/24  0500 01/05/24 0500  Weight: 78.7 kg 76.8 kg 78 kg    Exam:  GEN: NAD SKIN: Warm and dry EYES: No pallor or icterus ENT: MMM CV: RRR PULM: CTA B ABD: soft, ND, NT, +BS CNS: AAO x 3, non focal EXT: No edema or tenderness     Data Reviewed:   I have personally reviewed following labs and imaging studies:  Labs: Labs show the following:   Basic Metabolic Panel: Recent Labs  Lab 12/30/23 0433 12/31/23 0358 01/01/24 0436 01/02/24 0334 01/03/24 0827  NA 126* 128* 128* 129* 131*  K 4.4 4.6 3.9 4.0 4.0  CL 91* 93* 94* 96* 96*  CO2 20* 21* 22 22 24   GLUCOSE 183* 162* 159* 229* 198*  BUN 48* 56* 49* 45* 39*  CREATININE 1.18* 1.20* 1.08* 0.97 0.88  CALCIUM  8.9 8.8* 8.6* 8.4* 8.9  MG  --   --  2.1  --   --   PHOS  --   --  4.9*  --   --    GFR Estimated Creatinine Clearance: 43.2 mL/min (by C-G formula based on SCr of 0.88 mg/dL). Liver Function Tests: No results for input(s): AST, ALT, ALKPHOS, BILITOT, PROT, ALBUMIN  in the last 168 hours. No results for input(s): LIPASE, AMYLASE in the last 168 hours. No results for input(s): AMMONIA in the last 168 hours. Coagulation profile No results for input(s): INR, PROTIME in the last 168 hours.  CBC: Recent Labs  Lab 12/30/23 0433 01/01/24 0436 01/02/24 0334  WBC 9.8 8.3 7.9  HGB 9.8* 9.6* 9.5*  HCT 30.4* 29.5* 28.5*  MCV 106.7* 107.3* 106.3*  PLT 225 276 290   Cardiac Enzymes: No results for input(s): CKTOTAL, CKMB,  CKMBINDEX, TROPONINI in the last 168 hours. BNP (last 3 results) Recent Labs    12/22/23 1247 12/25/23 1343  PROBNP 418.0* 6,107.0*   CBG: Recent Labs  Lab 01/04/24 0832 01/04/24 1235 01/04/24 2041 01/05/24 0825 01/05/24 1227  GLUCAP 205* 233* 223* 178* 238*   D-Dimer: No results for input(s): DDIMER in the last 72 hours. Hgb A1c: No results for input(s): HGBA1C in the last 72 hours. Lipid Profile: No results for input(s): CHOL, HDL, LDLCALC, TRIG, CHOLHDL, LDLDIRECT in the last 72 hours. Thyroid function studies: No results for input(s): TSH, T4TOTAL, T3FREE, THYROIDAB in the last 72 hours.  Invalid input(s): FREET3 Anemia work up: No results for input(s): VITAMINB12, FOLATE, FERRITIN, TIBC, IRON, RETICCTPCT in the last 72 hours. Sepsis Labs: Recent Labs  Lab 12/30/23 0433 01/01/24 0436 01/02/24 0334  WBC 9.8 8.3 7.9    Microbiology No results found for this or any previous visit (from the past 240 hours).  Procedures and diagnostic studies:  No results found.              LOS: 15 days   Tywon Niday  [employer] Adjuster on [email or web address]. If 7PM-7AM, please contact night-coverage at [email or web address]     01/05/2024, 2:20 PM           "

## 2024-01-06 LAB — GLUCOSE, CAPILLARY
Glucose-Capillary: 208 mg/dL — ABNORMAL HIGH (ref 70–99)
Glucose-Capillary: 151 mg/dL — ABNORMAL HIGH (ref 70–99)
Glucose-Capillary: 309 mg/dL — ABNORMAL HIGH (ref 70–99)
Glucose-Capillary: 193 mg/dL — ABNORMAL HIGH (ref 70–99)
Glucose-Capillary: 174 mg/dL — ABNORMAL HIGH (ref 70–99)

## 2024-01-06 MED ORDER — INSULIN ASPART 100 UNIT/ML IJ SOLN
6.0000 [IU] | Freq: Three times a day (TID) | INTRAMUSCULAR | Status: DC
  Administered 2024-01-06 – 2024-01-15 (×29): 6 [IU] via SUBCUTANEOUS
  Filled 2024-01-06 (×29): qty 6

## 2024-01-06 MED ORDER — INSULIN GLARGINE 100 UNIT/ML ~~LOC~~ SOLN
12.0000 [IU] | Freq: Every day | SUBCUTANEOUS | Status: DC
  Administered 2024-01-06 – 2024-01-09 (×4): 12 [IU] via SUBCUTANEOUS
  Filled 2024-01-06 (×4): qty 0.12

## 2024-01-06 NOTE — TOC Progression Note (Addendum)
 Transition of Care [facility]) - Progression Note    Patient Details  Name: Jacqueline Arroyo MRN: [id number] Date of Birth: 04/13/1940  [facility] CM/SW Contact  Dalia GORMAN Fuse, RN Phone Number: 01/06/2024, 9:24 AM  Clinical Narrative:    Patient has a bed at [facility], the facility is requesting the ins auth. TOC sent secure message to Darrien to request update on auth status. F/U is pending.  [id number]: Darrien at [facility] confirmed ins shara is still pending.  TOC will continue to follow.   Expected Discharge Plan: Home w Home Health Services Barriers to Discharge: Continued Medical Work up               Expected Discharge Plan and Services In-house Referral: Clinical Social Work Discharge Planning Services: CM Consult   Living arrangements for the past 2 months: Apartment                                       Social Drivers of Health (SDOH) Interventions SDOH Screenings   Food Insecurity: Patient Declined (12/22/2023)  Housing: Patient Declined (12/22/2023)  Transportation Needs: Patient Declined (12/22/2023)  Utilities: Patient Declined (12/22/2023)  Financial Resource Strain: Low Risk (12/16/2023)   Received from [facility]  Physical Activity: Insufficiently Active (06/20/2023)   Received from [facility]  Social Connections: Patient Declined (12/22/2023)  Stress: No Stress Concern Present (06/20/2023)   Received from [facility]  Tobacco Use: Low Risk (12/21/2023)  Health Literacy: Low Risk (06/20/2023)   Received from [facility]    Readmission Risk Interventions    12/31/2023    4:35 PM  Readmission Risk Prevention Plan  Transportation Screening Complete  PCP or Specialist Appt within 5-7 Days Complete  Home Care Screening Complete  Medication Review (RN CM) Complete

## 2024-01-06 NOTE — Plan of Care (Signed)
" °  Problem: Fluid Volume: Goal: Hemodynamic stability will improve Outcome: Progressing   Problem: Clinical Measurements: Goal: Diagnostic test results will improve Outcome: Progressing Goal: Signs and symptoms of infection will decrease Outcome: Progressing   Problem: Respiratory: Goal: Ability to maintain adequate ventilation will improve Outcome: Progressing   Problem: Education: Goal: Ability to describe self-care measures that may prevent or decrease complications (Diabetes Survival Skills Education) will improve Outcome: Progressing Goal: Individualized Educational Video(s) Outcome: Progressing   Problem: Coping: Goal: Ability to adjust to condition or change in health will improve Outcome: Progressing   Problem: Fluid Volume: Goal: Ability to maintain a balanced intake and output will improve Outcome: Progressing   Problem: Health Behavior/Discharge Planning: Goal: Ability to identify and utilize available resources and services will improve Outcome: Progressing Goal: Ability to manage health-related needs will improve Outcome: Progressing   Problem: Metabolic: Goal: Ability to maintain appropriate glucose levels will improve Outcome: Progressing   Problem: Nutritional: Goal: Maintenance of adequate nutrition will improve Outcome: Progressing Goal: Progress toward achieving an optimal weight will improve Outcome: Progressing   Problem: Skin Integrity: Goal: Risk for impaired skin integrity will decrease Outcome: Progressing   Problem: Tissue Perfusion: Goal: Adequacy of tissue perfusion will improve Outcome: Progressing   Problem: Education: Goal: Knowledge of General Education information will improve Description: Including pain rating scale, medication(s)/side effects and non-pharmacologic comfort measures Outcome: Progressing   Problem: Health Behavior/Discharge Planning: Goal: Ability to manage health-related needs will improve Outcome:  Progressing   Problem: Clinical Measurements: Goal: Ability to maintain clinical measurements within normal limits will improve Outcome: Progressing Goal: Will remain free from infection Outcome: Progressing Goal: Diagnostic test results will improve Outcome: Progressing Goal: Respiratory complications will improve Outcome: Progressing Goal: Cardiovascular complication will be avoided Outcome: Progressing   Problem: Activity: Goal: Risk for activity intolerance will decrease Outcome: Progressing   Problem: Nutrition: Goal: Adequate nutrition will be maintained Outcome: Progressing   Problem: Coping: Goal: Level of anxiety will decrease Outcome: Progressing   Problem: Elimination: Goal: Will not experience complications related to bowel motility Outcome: Progressing Goal: Will not experience complications related to urinary retention Outcome: Progressing   Problem: Pain Managment: Goal: General experience of comfort will improve and/or be controlled Outcome: Progressing   Problem: Safety: Goal: Ability to remain free from injury will improve Outcome: Progressing   Problem: Skin Integrity: Goal: Risk for impaired skin integrity will decrease Outcome: Progressing   Problem: Activity: Goal: Ability to tolerate increased activity will improve Outcome: Progressing   Problem: Clinical Measurements: Goal: Ability to maintain a body temperature in the normal range will improve Outcome: Progressing   Problem: Respiratory: Goal: Ability to maintain adequate ventilation will improve Outcome: Progressing Goal: Ability to maintain a clear airway will improve Outcome: Progressing   "

## 2024-01-06 NOTE — Plan of Care (Signed)
" °  Problem: Clinical Measurements: Goal: Diagnostic test results will improve Outcome: Progressing   Problem: Respiratory: Goal: Ability to maintain adequate ventilation will improve Outcome: Progressing   Problem: Education: Goal: Ability to describe self-care measures that may prevent or decrease complications (Diabetes Survival Skills Education) will improve Outcome: Progressing   Problem: Fluid Volume: Goal: Ability to maintain a balanced intake and output will improve Outcome: Progressing   "

## 2024-01-06 NOTE — Progress Notes (Signed)
 "    Progress Note    Jacqueline Arroyo  FMW:[id number] DOB: 1940/08/03  DOA: 12/21/2023 PCP: [facility]      Brief Narrative:    Medical records reviewed and are as summarized below:  Jacqueline Arroyo is a 84 y.o. female  with medical history significant for mild on surveillance, diabetes, HTN, HLD, asthma, polycythemia vera, HFrEF with last EF was around 35 to 40% from echo in 2025 July, CKD who presented to ED at [facility] for worsening shortness of breath cough and generalized weakness.  Patient had a recent hospitalization at [facility] and was discharged from the hospital on 12/16/2023 after being diagnosed with bronchitis and exacerbation of asthma.  She presented on 12/19  with worsening respiratory symptoms after she went home along with some chills, generalized weakness and worsening nausea.  Admitted for possible community-acquired pneumonia, asthma exacerbation, metapneumovirus infection.       Assessment/Plan:   Principal Problem:   CAP (community acquired pneumonia) Active Problems:   HFrEF (heart failure with reduced ejection fraction) (HCC)   HTN (hypertension)   HLD (hyperlipidemia)   DM (diabetes mellitus) (HCC)   Asthma, chronic   Polycythemia vera (HCC)   Nutrition Problem: Inadequate oral intake Etiology: acute illness  Signs/Symptoms: meal completion < 25%   Body mass index is 33.58 kg/m.   Possible CAP  Asthma exacerbation  Metapnemovirus + Chronic hypoxic respiratory failure - 2L [place] Patient recently treated at [facility] for asthma exacerbation and viral bronchitis (metapneumovirus +). She was discharged with albuterol  and steroid course Presented with worsening SOB, elevated D-dimer, VQ neg for PE. CXR with small opacity adjacent to elevated diaphragm.  Completed abx course, prednisone  x 5 days Continue bronchodilators as needed    Left popliteal vein DVT Suspect due to immobility elevated D-dimer, VQ neg for PE US  lower extremity  shows nonocclusive acute DVT in left popliteal vein Continue Eliquis .  She is allergic to heparin /Lovenox     Acute on chronic HFrEF Echo shows EF 40 to 45%, LV global hypokinesis.  LA moderately dilated, trivial MR. Continue low-dose torsemide  and metoprolol .   S/p treatment with IV Lasix . 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  Cr peaked 1.60   Hyponatremia Improved.  Sodium slowly increased from 126-129-131.  Unclear etiology Continue fluid restriction    T2DM with hyperglycemia Steroid-induced hyperglycemia HbA1c 7.8 Completed prednisone  Increase Lantus  from 10 units to 12 units daily. Increase NovoLog  from 5 units 3 times daily with meals to 6 units 3 times daily with meals. Monitor glucose levels and adjust insulin  accordingly.    Acute urinary retention:  Bladder scan showed 935 mL of urine.  In and out urethral catheterization produced 1 L of urine on 01/06/2024.  Continue to monitor.    Left ankle osteoarthritis Has chronic pain XR ankle - no fracture Use Tylenol  as needed.  Discontinue tramadol     Left wrist pain, left shoulder pain XR Left wrist no acute fracture History right shoulder showed mild acromioclavicular degenerative change, mild subcortical cystic change in the lateral humeral head. Analgesics as needed for pain.   H/o Polycythemia vera  Hgb around 9.5. Noted to be 12-13 during recent hospitalization. No obvious bleeding noted, denies active bleeding/melena Iron panel consistent with ACD Continue hydroxyurea  PPI BID    Constipation Laxatives as needed     Comorbidities include hypertension, hyperlipidemia   Awaiting placement to SNF   Diet Order  Diet Carb Modified Room service appropriate? Yes; Fluid restriction: 1500 mL Fluid  Diet effective now                                  Consultants: Nephrologist  Procedures: None    Medications:    apixaban   5 mg  Oral BID   aspirin  EC  81 mg Oral Daily   atorvastatin   10 mg Oral Daily   cycloSPORINE   1 drop Both Eyes BID   feeding supplement  237 mL Oral BID BM   fluticasone  furoate-vilanterol  1 puff Inhalation Daily   hydroxyurea   500 mg Oral BID   insulin  aspart  0-5 Units Subcutaneous QHS   insulin  aspart  0-9 Units Subcutaneous TID WC   insulin  aspart  6 Units Subcutaneous TID WC   insulin  glargine  12 Units Subcutaneous Daily   lidocaine   1 patch Transdermal Q24H   LORazepam   0.25 mg Oral BID   metoprolol  succinate  50 mg Oral QHS   multivitamin with minerals  1 tablet Oral Daily   pantoprazole   40 mg Oral BID AC   thiamine   100 mg Oral Daily   torsemide   10 mg Oral Daily   Continuous Infusions:   Anti-infectives (From admission, onward)    Start     Dose/Rate Route Frequency Ordered Stop   12/24/23 1000  azithromycin  (ZITHROMAX ) tablet 500 mg        500 mg Oral Daily 12/23/23 1220 12/25/23 1001   12/21/23 0745  cefTRIAXone  (ROCEPHIN ) 2 g in sodium chloride  0.9 % 100 mL IVPB  Status:  Discontinued        2 g 200 mL/hr over 30 Minutes Intravenous Every 24 hours 12/21/23 0744 12/21/23 0746   12/21/23 0745  azithromycin  (ZITHROMAX ) 500 mg in sodium chloride  0.9 % 250 mL IVPB  Status:  Discontinued        500 mg 250 mL/hr over 60 Minutes Intravenous Every 24 hours 12/21/23 0744 12/21/23 0746   12/21/23 0630  cefTRIAXone  (ROCEPHIN ) 2 g in sodium chloride  0.9 % 100 mL IVPB        2 g 200 mL/hr over 30 Minutes Intravenous Every 24 hours 12/21/23 0629 12/25/23 9386   12/21/23 0630  azithromycin  (ZITHROMAX ) 500 mg in sodium chloride  0.9 % 250 mL IVPB  Status:  Discontinued        500 mg 250 mL/hr over 60 Minutes Intravenous Every 24 hours 12/21/23 0629 12/23/23 1220              Family Communication/Anticipated D/C date and plan/Code Status   DVT prophylaxis: SCDs Start: 12/21/23 0740 apixaban  (ELIQUIS ) tablet 5 mg     Code Status: Limited: Do not attempt resuscitation  (DNR) -DNR-LIMITED -Do Not Intubate/DNI   Family Communication: None Disposition Plan: Plan to discharge to SNF   Status is: Inpatient Remains inpatient appropriate because: Awaiting placement to SNF       Subjective:   Interval events noted.  She complains of difficulty passing urine.  She also complained of itching on the back. Donnell, RN, at the bedside  Objective:    Vitals:   01/05/24 1941 01/06/24 0401 01/06/24 0740 01/06/24 1602  BP: (!) 117/54 (!) 109/57 123/60 (!) 115/56  Pulse: 95 86 84 97  Resp: 18 18 18 17   Temp: 97.7 F (36.5 C) 98.7 F (37.1 C) 97.7 F (36.5 C) 98 F (36.7 C)  TempSrc:  Oral   Oral  SpO2: 92% 93% 93% 94%  Weight:      Height:       No data found.   Intake/Output Summary (Last 24 hours) at 01/06/2024 1603 Last data filed at 01/06/2024 0915 Gross per 24 hour  Intake 480 ml  Output 1350 ml  Net -870 ml    Filed Weights   01/03/24 0500 01/04/24 0500 01/05/24 0500  Weight: 78.7 kg 76.8 kg 78 kg    Exam:  GEN: NAD SKIN: No rash noted.  Stage II right buttock decubitus ulcer EYES: No pallor or icterus ENT: MMM CV: RRR PULM: CTA B ABD: soft, ND, NT, +BS CNS: AAO x 3, non focal EXT: No edema or tenderness      Data Reviewed:   I have personally reviewed following labs and imaging studies:  Labs: Labs show the following:   Basic Metabolic Panel: Recent Labs  Lab 12/31/23 0358 01/01/24 0436 01/02/24 0334 01/03/24 0827  NA 128* 128* 129* 131*  K 4.6 3.9 4.0 4.0  CL 93* 94* 96* 96*  CO2 21* 22 22 24   GLUCOSE 162* 159* 229* 198*  BUN 56* 49* 45* 39*  CREATININE 1.20* 1.08* 0.97 0.88  CALCIUM  8.8* 8.6* 8.4* 8.9  MG  --  2.1  --   --   PHOS  --  4.9*  --   --    GFR Estimated Creatinine Clearance: 43.2 mL/min (by C-G formula based on SCr of 0.88 mg/dL). Liver Function Tests: No results for input(s): AST, ALT, ALKPHOS, BILITOT, PROT, ALBUMIN  in the last 168 hours. No results for input(s): LIPASE,  AMYLASE in the last 168 hours. No results for input(s): AMMONIA in the last 168 hours. Coagulation profile No results for input(s): INR, PROTIME in the last 168 hours.  CBC: Recent Labs  Lab 01/01/24 0436 01/02/24 0334  WBC 8.3 7.9  HGB 9.6* 9.5*  HCT 29.5* 28.5*  MCV 107.3* 106.3*  PLT 276 290   Cardiac Enzymes: No results for input(s): CKTOTAL, CKMB, CKMBINDEX, TROPONINI in the last 168 hours. BNP (last 3 results) Recent Labs    12/22/23 1247 12/25/23 1343  PROBNP 418.0* 6,107.0*   CBG: Recent Labs  Lab 01/05/24 1227 01/05/24 1644 01/05/24 2103 01/06/24 0741 01/06/24 1159  GLUCAP 238* 367* 238* 151* 309*   D-Dimer: No results for input(s): DDIMER in the last 72 hours. Hgb A1c: No results for input(s): HGBA1C in the last 72 hours. Lipid Profile: No results for input(s): CHOL, HDL, LDLCALC, TRIG, CHOLHDL, LDLDIRECT in the last 72 hours. Thyroid function studies: No results for input(s): TSH, T4TOTAL, T3FREE, THYROIDAB in the last 72 hours.  Invalid input(s): FREET3 Anemia work up: No results for input(s): VITAMINB12, FOLATE, FERRITIN, TIBC, IRON, RETICCTPCT in the last 72 hours. Sepsis Labs: Recent Labs  Lab 01/01/24 0436 01/02/24 0334  WBC 8.3 7.9    Microbiology No results found for this or any previous visit (from the past 240 hours).  Procedures and diagnostic studies:  No results found.              LOS: 16 days   Beryl Balz  [employer]er on [email or web address]. If 7PM-7AM, please contact night-coverage at [email or web address]     01/06/2024, 4:03 PM           "

## 2024-01-07 LAB — CBC
HCT: 32.2 % — ABNORMAL LOW (ref 36.0–46.0)
Hemoglobin: 10.1 g/dL — ABNORMAL LOW (ref 12.0–15.0)
MCH: 34.4 pg — ABNORMAL HIGH (ref 26.0–34.0)
MCHC: 31.4 g/dL (ref 30.0–36.0)
MCV: 109.5 fL — ABNORMAL HIGH (ref 80.0–100.0)
Platelets: 361 K/uL (ref 150–400)
RBC: 2.94 MIL/uL — ABNORMAL LOW (ref 3.87–5.11)
RDW: 13.2 % (ref 11.5–15.5)
WBC: 3.5 K/uL — ABNORMAL LOW (ref 4.0–10.5)
nRBC: 0 % (ref 0.0–0.2)

## 2024-01-07 LAB — GLUCOSE, CAPILLARY
Glucose-Capillary: 164 mg/dL — ABNORMAL HIGH (ref 70–99)
Glucose-Capillary: 155 mg/dL — ABNORMAL HIGH (ref 70–99)
Glucose-Capillary: 243 mg/dL — ABNORMAL HIGH (ref 70–99)
Glucose-Capillary: 171 mg/dL — ABNORMAL HIGH (ref 70–99)

## 2024-01-07 MED ORDER — GLUCERNA SHAKE PO LIQD
237.0000 mL | Freq: Three times a day (TID) | ORAL | Status: DC
  Administered 2024-01-07 – 2024-01-14 (×13): 237 mL via ORAL

## 2024-01-07 MED ORDER — ORAL CARE MOUTH RINSE: 15.0000 mL | OROMUCOSAL | Status: DC | PRN

## 2024-01-07 MED ORDER — TORSEMIDE 20 MG PO TABS
10.0000 mg | ORAL_TABLET | ORAL | Status: DC
  Administered 2024-01-08 – 2024-01-14 (×4): 10 mg via ORAL
  Filled 2024-01-07 (×4): qty 1

## 2024-01-07 NOTE — Plan of Care (Signed)
" °  Problem: Clinical Measurements: Goal: Diagnostic test results will improve Outcome: Progressing   Problem: Respiratory: Goal: Ability to maintain adequate ventilation will improve Outcome: Progressing   Problem: Coping: Goal: Ability to adjust to condition or change in health will improve Outcome: Progressing   Problem: Health Behavior/Discharge Planning: Goal: Ability to manage health-related needs will improve Outcome: Progressing   "

## 2024-01-07 NOTE — TOC Progression Note (Signed)
 Transition of Care [facility]) - Progression Note    Patient Details  Name: Jacqueline Arroyo MRN: [id number] Date of Birth: [birth year]  [facility] CM/SW Contact  Dalia GORMAN Fuse, RN Phone Number: 01/07/2024, 9:54 AM  Clinical Narrative:    Patient has a bed at [facility]. The facility requested auth. Per Darrien the auth request shows pending medical review. Typically the payor would have requested updated notes at this point. The plan is for her to upload any additional notes today.  TOC will continue to follow.   Expected Discharge Plan: Home w Home Health Services Barriers to Discharge: Continued Medical Work up               Expected Discharge Plan and Services In-house Referral: Clinical Social Work Discharge Planning Services: CM Consult   Living arrangements for the past 2 months: Apartment                                       Social Drivers of Health (SDOH) Interventions SDOH Screenings   Food Insecurity: Patient Declined (12/22/2023)  Housing: Patient Declined (12/22/2023)  Transportation Needs: Patient Declined (12/22/2023)  Utilities: Patient Declined (12/22/2023)  Financial Resource Strain: Low Risk (12/16/2023)   Received from [facility]  Physical Activity: Insufficiently Active (06/20/2023)   Received from [facility]  Social Connections: Patient Declined (12/22/2023)  Stress: No Stress Concern Present (06/20/2023)   Received from [facility]  Tobacco Use: Low Risk (12/21/2023)  Health Literacy: Low Risk (06/20/2023)   Received from [facility]    Readmission Risk Interventions    12/31/2023    4:35 PM  Readmission Risk Prevention Plan  Transportation Screening Complete  PCP or Specialist Appt within 5-7 Days Complete  Home Care Screening Complete  Medication Review (RN CM) Complete

## 2024-01-07 NOTE — Progress Notes (Signed)
 "    Progress Note    Jacqueline Arroyo  FMW:[id number] DOB: 26-Dec-1940  DOA: 12/21/2023 [facility]CP: [facility]      Brief Narrative:    Medical records reviewed and are as summarized below:  Jacqueline Arroyo is a 84 y.o. female  with medical history significant for mild on surveillance, diabetes, HTN, HLD, asthma, polycythemia vera, HFrEF with last EF was around 35 to 40% from echo in 2025 July, CKD who presented to ED at [facility] for worsening shortness of breath cough and generalized weakness.  Patient had a recent hospitalization at [facility] and was discharged from the hospital on 12/16/2023 after being diagnosed with bronchitis and exacerbation of asthma.  She presented on 12/19  with worsening respiratory symptoms after she went home along with some chills, generalized weakness and worsening nausea.  Admitted for possible community-acquired pneumonia, asthma exacerbation, metapneumovirus infection.       Assessment/Plan:   Principal Problem:   CAP (community acquired pneumonia) Active Problems:   HFrEF (heart failure with reduced ejection fraction) (HCC)   HTN (hypertension)   HLD (hyperlipidemia)   DM (diabetes mellitus) (HCC)   Asthma, chronic   Polycythemia vera (HCC)   Nutrition Problem: Inadequate oral intake Etiology: acute illness  Signs/Symptoms: meal completion < 25%   Body mass index is 33.54 kg/m.   Possible CAP  Asthma exacerbation  Metapnemovirus + Chronic hypoxic respiratory failure - 2L [place] Patient recently treated at [facility] for asthma exacerbation and viral bronchitis (metapneumovirus +). She was discharged with albuterol  and steroid course Presented with worsening SOB, elevated D-dimer, VQ neg for PE. CXR with small opacity adjacent to elevated diaphragm.  Completed abx course, prednisone  x 5 days Continue bronchodilators as needed    Left popliteal vein DVT Suspect due to immobility elevated D-dimer, VQ neg for PE US  lower extremity  shows nonocclusive acute DVT in left popliteal vein Continue Eliquis .  She is allergic to heparin /Lovenox     Acute on chronic HFrEF Echo shows EF 40 to 45%, LV global hypokinesis.  LA moderately dilated, trivial MR. Change torsemide  from daily dosing to every other day dosing.  Continue metoprolol . S/p treatment with IV Lasix . 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  Cr peaked 1.60   Hyponatremia Improved.  Sodium slowly increased from 126-129-131.  Unclear etiology Continue fluid restriction    T2DM with hyperglycemia Steroid-induced hyperglycemia HbA1c 7.8 Completed prednisone  Continue Lantus  at 12 units daily and NovoLog  6 units 3 times daily with meals. Monitor glucose levels and adjust insulin  accordingly.    Acute urinary retention:  Bladder scan showed 935 mL of urine.  In and out urethral catheterization produced 1 L of urine on 01/06/2024.   She has been able to urinate spontaneously (multiple times) after transient urethral catheterization for urinary retention.    Left ankle osteoarthritis Has chronic pain XR ankle - no fracture Use Tylenol  as needed.  Discontinue tramadol     Left wrist pain, left shoulder pain XR Left wrist no acute fracture History right shoulder showed mild acromioclavicular degenerative change, mild subcortical cystic change in the lateral humeral head. Analgesics as needed for pain.   H/o Polycythemia vera  Hgb around 9.5. Noted to be 12-13 during recent hospitalization. No obvious bleeding noted, denies active bleeding/melena Iron panel consistent with ACD Continue hydroxyurea  PPI BID    Constipation Laxatives as needed     Comorbidities include hypertension, hyperlipidemia   Awaiting placement to SNF  Diet Order             Diet Carb Modified Room service appropriate? Yes; Fluid restriction: 1500 mL Fluid  Diet effective now                                   Consultants: Nephrologist  Procedures: None    Medications:    apixaban   5 mg Oral BID   aspirin  EC  81 mg Oral Daily   atorvastatin   10 mg Oral Daily   cycloSPORINE   1 drop Both Eyes BID   feeding supplement (GLUCERNA SHAKE)  237 mL Oral TID BM   fluticasone  furoate-vilanterol  1 puff Inhalation Daily   hydroxyurea   500 mg Oral BID   insulin  aspart  0-5 Units Subcutaneous QHS   insulin  aspart  0-9 Units Subcutaneous TID WC   insulin  aspart  6 Units Subcutaneous TID WC   insulin  glargine  12 Units Subcutaneous Daily   lidocaine   1 patch Transdermal Q24H   LORazepam   0.25 mg Oral BID   metoprolol  succinate  50 mg Oral QHS   multivitamin with minerals  1 tablet Oral Daily   pantoprazole   40 mg Oral BID AC   [START ON 01/08/2024] torsemide   10 mg Oral QODAY   Continuous Infusions:   Anti-infectives (From admission, onward)    Start     Dose/Rate Route Frequency Ordered Stop   12/24/23 1000  azithromycin  (ZITHROMAX ) tablet 500 mg        500 mg Oral Daily 12/23/23 1220 12/25/23 1001   12/21/23 0745  cefTRIAXone  (ROCEPHIN ) 2 g in sodium chloride  0.9 % 100 mL IVPB  Status:  Discontinued        2 g 200 mL/hr over 30 Minutes Intravenous Every 24 hours 12/21/23 0744 12/21/23 0746   12/21/23 0745  azithromycin  (ZITHROMAX ) 500 mg in sodium chloride  0.9 % 250 mL IVPB  Status:  Discontinued        500 mg 250 mL/hr over 60 Minutes Intravenous Every 24 hours 12/21/23 0744 12/21/23 0746   12/21/23 0630  cefTRIAXone  (ROCEPHIN ) 2 g in sodium chloride  0.9 % 100 mL IVPB        2 g 200 mL/hr over 30 Minutes Intravenous Every 24 hours 12/21/23 0629 12/25/23 9386   12/21/23 0630  azithromycin  (ZITHROMAX ) 500 mg in sodium chloride  0.9 % 250 mL IVPB  Status:  Discontinued        500 mg 250 mL/hr over 60 Minutes Intravenous Every 24 hours 12/21/23 0629 12/23/23 1220              Family Communication/Anticipated D/C date and plan/Code Status   DVT  prophylaxis: SCDs Start: 12/21/23 0740 apixaban  (ELIQUIS ) tablet 5 mg     Code Status: Limited: Do not attempt resuscitation (DNR) -DNR-LIMITED -Do Not Intubate/DNI   Family Communication: None Disposition Plan: Plan to discharge to SNF   Status is: Inpatient Remains inpatient appropriate because: Awaiting placement to SNF       Subjective:   Interval events noted.  No new complaints.  She has urinated multiple times after in and out with her catheterization on 01/06/2024 though sometimes she has to strain to urinate.  No other complaints.  Objective:    Vitals:   01/06/24 1941 01/07/24 0410 01/07/24 0500 01/07/24 0749  BP: (!) 111/50 117/60  119/65  Pulse: 90 72  85  Resp: 16 16  19  Temp: 97.9 F (36.6 C) 98.1 F (36.7 C)  (!) 97.5 F (36.4 C)  TempSrc: Oral Oral    SpO2: 96% 96%  95%  Weight:   77.9 kg   Height:       No data found.   Intake/Output Summary (Last 24 hours) at 01/07/2024 1511 Last data filed at 01/07/2024 1300 Gross per 24 hour  Intake 480 ml  Output 1250 ml  Net -770 ml    Filed Weights   01/04/24 0500 01/05/24 0500 01/07/24 0500  Weight: 76.8 kg 78 kg 77.9 kg    Exam:  GEN: NAD SKIN: Warm and dry EYES: No pallor or icterus ENT: MMM CV: RRR PULM: CTA B ABD: soft, ND, NT, +BS CNS: AAO x 3, non focal EXT: No edema or tenderness    Data Reviewed:   I have personally reviewed following labs and imaging studies:  Labs: Labs show the following:   Basic Metabolic Panel: Recent Labs  Lab 01/01/24 0436 01/02/24 0334 01/03/24 0827  NA 128* 129* 131*  K 3.9 4.0 4.0  CL 94* 96* 96*  CO2 22 22 24   GLUCOSE 159* 229* 198*  BUN 49* 45* 39*  CREATININE 1.08* 0.97 0.88  CALCIUM  8.6* 8.4* 8.9  MG 2.1  --   --   PHOS 4.9*  --   --    GFR Estimated Creatinine Clearance: 43.2 mL/min (by C-G formula based on SCr of 0.88 mg/dL). Liver Function Tests: No results for input(s): AST, ALT, ALKPHOS, BILITOT, PROT, ALBUMIN  in  the last 168 hours. No results for input(s): LIPASE, AMYLASE in the last 168 hours. No results for input(s): AMMONIA in the last 168 hours. Coagulation profile No results for input(s): INR, PROTIME in the last 168 hours.  CBC: Recent Labs  Lab 01/01/24 0436 01/02/24 0334 01/07/24 0536  WBC 8.3 7.9 3.5*  HGB 9.6* 9.5* 10.1*  HCT 29.5* 28.5* 32.2*  MCV 107.3* 106.3* 109.5*  PLT 276 290 361   Cardiac Enzymes: No results for input(s): CKTOTAL, CKMB, CKMBINDEX, TROPONINI in the last 168 hours. BNP (last 3 results) Recent Labs    12/22/23 1247 12/25/23 1343  PROBNP 418.0* 6,107.0*   CBG: Recent Labs  Lab 01/06/24 1159 01/06/24 1607 01/06/24 2053 01/07/24 0750 01/07/24 1124  GLUCAP 309* 193* 174* 164* 155*   D-Dimer: No results for input(s): DDIMER in the last 72 hours. Hgb A1c: No results for input(s): HGBA1C in the last 72 hours. Lipid Profile: No results for input(s): CHOL, HDL, LDLCALC, TRIG, CHOLHDL, LDLDIRECT in the last 72 hours. Thyroid function studies: No results for input(s): TSH, T4TOTAL, T3FREE, THYROIDAB in the last 72 hours.  Invalid input(s): FREET3 Anemia work up: No results for input(s): VITAMINB12, FOLATE, FERRITIN, TIBC, IRON, RETICCTPCT in the last 72 hours. Sepsis Labs: Recent Labs  Lab 01/01/24 0436 01/02/24 0334 01/07/24 0536  WBC 8.3 7.9 3.5*    Microbiology No results found for this or any previous visit (from the past 240 hours).  Procedures and diagnostic studies:  No results found.              LOS: 17 days   Jacqueline Arroyo  [employer]er on [email or web address]. If 7PM-7AM, please contact night-coverage at [email or web address]     01/07/2024, 3:11 PM           "

## 2024-01-07 NOTE — Progress Notes (Signed)
 Physical Therapy Treatment Patient Details Name: Jacqueline Arroyo MRN: [id number] DOB: 06/27/1940 Today's Date: 01/07/2024   History of Present Illness Pt is a pleasant 84 y.o. female with medical history significant for diabetes, HTN, HLD, asthma, polycythemia vera, HFrEF with last EF was around 35 to 40% from echo in 2025 July, CKD who presented to ED at [facility] for worsening shortness of breath cough and generalized weakness. MD assessment includes:  Possible CAP, asthma exacerbation, metapnemovirus, AKI.    PT Comments  Pt received in bed, easily awakened, daughter at bedside. Pt agreed to PT. L wrist splint applied, pt states wrist is feeling better. L chronic ankle pain with Hx of swelling and sprain per chart review, no Fx. Pt required MinA to stand onto fall mat for cushioning at RW. She was able to side step towards chair with increased time and cues for wt shifting. Pt struggles accepting wt through L foot due to chronic pain. Will attempt to progress forward gait next session.    If plan is discharge home, recommend the following: Assistance with cooking/housework;Assist for transportation;Two people to help with walking and/or transfers;A lot of help with bathing/dressing/bathroom   Can travel by private vehicle     No  Equipment Recommendations  Other (comment) (TBD at next level of care)    Recommendations for Other Services       Precautions / Restrictions Precautions Precautions: Fall Recall of Precautions/Restrictions: Intact Precaution/Restrictions Comments: L wrist splint for pain control Required Braces or Orthoses: Splint/Cast Splint/Cast: LUE/ wrist Restrictions Weight Bearing Restrictions Per Provider Order: No Other Position/Activity Restrictions: Chronic bilateral ankle pain     Mobility  Bed Mobility Overal bed mobility: Needs Assistance Bed Mobility: Supine to Sit     Supine to sit: Min assist, HOB elevated, Used rails     General bed mobility comments:   (Able to complete with heavy assist of bed controls and rail)    Transfers Overall transfer level: Needs assistance Equipment used: Rolling walker (2 wheels) Transfers: Sit to/from Stand Sit to Stand: Min assist, From elevated surface           General transfer comment:  (Left foot/ankle discomfort with standing wt bearing activity and support of RW)    Ambulation/Gait Ambulation/Gait assistance: Min assist Gait Distance (Feet): 2 Feet Assistive device: Rolling walker (2 wheels) Gait Pattern/deviations: Antalgic, Decreased step length - right, Step-through pattern, Decreased step length - left, Shuffle, Trunk flexed Gait velocity: decreased     General Gait Details: Pt able to take several small, shuffling steps at the EOB to chair with min A for stability and to guide the RW. Increased difficulty side stepping towards Left   Stairs             Wheelchair Mobility     Tilt Bed    Modified Rankin (Stroke Patients Only)       Balance Overall balance assessment: Needs assistance Sitting-balance support: Feet supported Sitting balance-Leahy Scale: Fair Sitting balance - Comments:  (No LOB sitting for several minutes at EOB)   Standing balance support: Bilateral upper extremity supported, During functional activity, Reliant on assistive device for balance Standing balance-Leahy Scale: Poor Standing balance comment:  (High fall risk)                            Communication Communication Communication: No apparent difficulties  Cognition Arousal: Alert Behavior During Therapy: WFL for tasks assessed/performed, Flat affect   PT -  Cognitive impairments: No apparent impairments                       PT - Cognition Comments: Pt is A and O x 4. Daughter present at bedside. Following commands: Intact      Cueing Cueing Techniques: Verbal cues, Tactile cues  Exercises      General Comments General comments (skin integrity, edema, etc.):   (scattered red rash on back, daughter states from Heparin .)      Pertinent Vitals/Pain Pain Assessment Pain Assessment: Faces Faces Pain Scale: Hurts little more Pain Location:  (Left foot with wt bearing) Pain Descriptors / Indicators: Sore Pain Intervention(s): Limited activity within patient's tolerance    Home Living                          Prior Function            PT Goals (current goals can now be found in the care plan section) Acute Rehab PT Goals Patient Stated Goal: rehab then home Progress towards PT goals: Progressing toward goals    Frequency    Min 2X/week      PT Plan      Co-evaluation              AM-PAC PT 6 Clicks Mobility   Outcome Measure  Help needed turning from your back to your side while in a flat bed without using bedrails?: A Lot Help needed moving from lying on your back to sitting on the side of a flat bed without using bedrails?: A Little Help needed moving to and from a bed to a chair (including a wheelchair)?: A Little Help needed standing up from a chair using your arms (e.g., wheelchair or bedside chair)?: A Little Help needed to walk in hospital room?: Total Help needed climbing 3-5 steps with a railing? : Total 6 Click Score: 13    End of Session Equipment Utilized During Treatment: Oxygen;Gait belt Activity Tolerance: Patient tolerated treatment well Patient left: in chair;with call bell/phone within reach;with family/visitor present Nurse Communication: Mobility status PT Visit Diagnosis: Difficulty in walking, not elsewhere classified (R26.2);Muscle weakness (generalized) (M62.81);Pain Pain - Right/Left: Right Pain - part of body: Shoulder     Time: 1416-1440 PT Time Calculation (min) (ACUTE ONLY): 24 min  Charges:    $Therapeutic Activity: 23-37 mins PT General Charges $$ ACUTE PT VISIT: 1 Visit                    Darice Bohr, PTA  [name] 01/07/2024, 3:00 PM

## 2024-01-07 NOTE — Progress Notes (Signed)
 Nutrition Follow-up  DOCUMENTATION CODES:   Obesity unspecified  INTERVENTION:   -Continue MVI with minerals daily -Continue carb modified diet -D/c Ensure Plus High Protein po BID, each supplement provides 350 kcal and 20 grams of protein  -Continue Magic cup TID with meals, each supplement provides 290 kcal and 9 grams of protein  -Glucerna Shake po TID, each supplement provides 220 kcal and 10 grams of protein  -500 mg vitamin C  BID -220 mg zinc  sulfate daily x 14 days  NUTRITION DIAGNOSIS:   Inadequate oral intake related to acute illness as evidenced by meal completion < 25%.  Ongoing  GOAL:   Patient will meet greater than or equal to 90% of their needs  Progressing   MONITOR:   PO intake, Supplement acceptance, Labs, Weight trends, Skin, I & O's  REASON FOR ASSESSMENT:   Consult Assessment of nutrition requirement/status  ASSESSMENT:   84 y/o female with h/o CHF, asthma, HTN, HLD, DM, Afib, CKD, polycythemia and recent admission to [facility] for asthma exacerbation and who is now admitted with community-acquired pneumonia, asthma exacerbation, metapneumovirus infection, new DVT and AKI.  Reviewed I/O's: -1.3 L x 24 hours and -5.4 L since 12/24/23  UOP: 1.8 L x 24 hours   Patient sleeping soundly at time of visit. She did not awaken to name being called. No family present at time of visit.   Patient on a carb modified diet. Noted improvement in oral intake; meal completions now 25-100%. Patient is also taking Ensure supplements. Given patient's improved oral intake and hyperglycemia, will adjust supplements to optimize glycemic control.   Reviewed weights; weight has ranged from 72.7-78 kg over the past 7 days. Suspect some weight fluctuations may be related to fluid status; patient -5.4 L since 12/24/23.  Per TOC notes, plan to discharge to SNF [facility]) once medically stable. Patient awaiting insurance authorization.   Medications refiewed and include  ativan  and protonix .   Labs reviewed: CBGS: 151-309 (inpatient orders for glycemic control are 0-5 units insulin  aspart daily at bedtime, 0-9 units insulin  aspart TID with meals, 6 units insulin  aspart TID with meals, and 12 units insulin  glargine daily).    Diet Order:   Diet Order             Diet Carb Modified Room service appropriate? Yes; Fluid restriction: 1500 mL Fluid  Diet effective now                   EDUCATION NEEDS:   Education needs have been addressed  Skin:  Skin Assessment: Skin Integrity Issues: Skin Integrity Issues:: Stage II Stage II: right buttocks  Last BM:  01/06/24 (type 4)  Height:   Ht Readings from Last 1 Encounters:  12/29/23 5' (1.524 m)    Weight:   Wt Readings from Last 1 Encounters:  01/07/24 77.9 kg    Ideal Body Weight:  45.45 kg  BMI:  Body mass index is 33.54 kg/m.  Estimated Nutritional Needs:   Kcal:  1500-1700kcal/day  Protein:  75-85g/day  Fluid:  1.3-1.5L/day    Margery ORN, RD, LDN, CDCES Registered Dietitian III Certified Diabetes Care and Education Specialist If unable to reach this RD, please use RD Inpatient group chat on secure chat between hours of 8am-4 pm daily

## 2024-01-08 DIAGNOSIS — J189 Pneumonia, unspecified organism: Secondary | ICD-10-CM | POA: Diagnosis not present

## 2024-01-08 LAB — GLUCOSE, CAPILLARY
Glucose-Capillary: 142 mg/dL — ABNORMAL HIGH (ref 70–99)
Glucose-Capillary: 146 mg/dL — ABNORMAL HIGH (ref 70–99)
Glucose-Capillary: 148 mg/dL — ABNORMAL HIGH (ref 70–99)
Glucose-Capillary: 162 mg/dL — ABNORMAL HIGH (ref 70–99)

## 2024-01-08 MED ORDER — OXYCODONE HCL 5 MG PO TABS
5.0000 mg | ORAL_TABLET | Freq: Once | ORAL | Status: AC
  Administered 2024-01-08: 5 mg via ORAL
  Filled 2024-01-08: qty 1

## 2024-01-08 MED ORDER — HYDROXYUREA 500 MG PO CAPS
500.0000 mg | ORAL_CAPSULE | Freq: Every day | ORAL | Status: AC
  Administered 2024-01-09 – 2024-01-12 (×4): 500 mg via ORAL
  Filled 2024-01-08 (×4): qty 1

## 2024-01-08 MED ORDER — VITAMIN C 500 MG PO TABS
500.0000 mg | ORAL_TABLET | Freq: Two times a day (BID) | ORAL | Status: DC
  Administered 2024-01-08 – 2024-01-15 (×15): 500 mg via ORAL
  Filled 2024-01-08 (×15): qty 1

## 2024-01-08 MED ORDER — TRAMADOL HCL 50 MG PO TABS
50.0000 mg | ORAL_TABLET | Freq: Two times a day (BID) | ORAL | Status: DC | PRN
  Administered 2024-01-08 – 2024-01-14 (×9): 50 mg via ORAL
  Filled 2024-01-08 (×9): qty 1

## 2024-01-08 MED ORDER — ZINC SULFATE 220 (50 ZN) MG PO CAPS
220.0000 mg | ORAL_CAPSULE | Freq: Every day | ORAL | Status: DC
  Administered 2024-01-08 – 2024-01-15 (×8): 220 mg via ORAL
  Filled 2024-01-08 (×8): qty 1

## 2024-01-08 NOTE — TOC Progression Note (Signed)
 Transition of Care [facility]) - Progression Note    Patient Details  Name: Jacqueline Arroyo MRN: [id number] Date of Birth: [birth year]  Transition of Care [facility]) CM/SW Contact  Dalia GORMAN Fuse, RN Phone Number: 01/08/2024, 9:14 AM  Clinical Narrative:     [facility] sent message to Darrien at [facility] to check the ins auth status, awaiting f/u.  Expected Discharge Plan: Home w Home Health Services Barriers to Discharge: Continued Medical Work up               Expected Discharge Plan and Services In-house Referral: Clinical Social Work Discharge Planning Services: CM Consult   Living arrangements for the past 2 months: Apartment                                       Social Drivers of Health (SDOH) Interventions SDOH Screenings   Food Insecurity: Patient Declined (12/22/2023)  Housing: Patient Declined (12/22/2023)  Transportation Needs: Patient Declined (12/22/2023)  Utilities: Patient Declined (12/22/2023)  Financial Resource Strain: Low Risk (12/16/2023)   Received from [facility]  Physical Activity: Insufficiently Active (06/20/2023)   Received from [facility]  Social Connections: Patient Declined (12/22/2023)  Stress: No Stress Concern Present (06/20/2023)   Received from [facility]  Tobacco Use: Low Risk (12/21/2023)  Health Literacy: Low Risk (06/20/2023)   Received from [facility]    Readmission Risk Interventions    12/31/2023    4:35 PM  Readmission Risk Prevention Plan  Transportation Screening Complete  PCP or Specialist Appt within 5-7 Days Complete  Home Care Screening Complete  Medication Review (RN CM) Complete

## 2024-01-08 NOTE — Plan of Care (Signed)
" °  Problem: Clinical Measurements: Goal: Diagnostic test results will improve Outcome: Progressing   Problem: Respiratory: Goal: Ability to maintain adequate ventilation will improve Outcome: Progressing   Problem: Coping: Goal: Ability to adjust to condition or change in health will improve Outcome: Progressing   Problem: Nutritional: Goal: Maintenance of adequate nutrition will improve Outcome: Progressing   "

## 2024-01-08 NOTE — Progress Notes (Signed)
 PT Cancellation Note  Patient Details Name: Jacqueline Arroyo MRN: [id number] DOB: [birth year]   Cancelled Treatment:     Pt received in bed, daughter at bedside. Pt politely refused stating she was too tired after not sleeping last night, declined any activity. Will re-attempt next available date/time per POC.    [name] 01/08/2024, 1:45 PM

## 2024-01-08 NOTE — Progress Notes (Signed)
 "    Progress Note    Jacqueline Arroyo  FMW:[id number] DOB: 1940/12/23  DOA: 12/21/2023 PCP: [facility]      Brief Narrative:    Medical records reviewed and are as summarized below:  Jacqueline Arroyo is a 84 y.o. female  with medical history significant for mild on surveillance, diabetes, HTN, HLD, asthma, polycythemia vera, HFrEF with last EF was around 35 to 40% from echo in 2025 July, CKD who presented to ED at [facility] for worsening shortness of breath cough and generalized weakness.  Patient had a recent hospitalization at [facility] and was discharged from the hospital on 12/16/2023 after being diagnosed with bronchitis and exacerbation of asthma.  She presented on 12/19  with worsening respiratory symptoms after she went home along with some chills, generalized weakness and worsening nausea.  Admitted for possible community-acquired pneumonia, asthma exacerbation, metapneumovirus infection.       Assessment/Plan:   Principal Problem:   CAP (community acquired pneumonia) Active Problems:   HFrEF (heart failure with reduced ejection fraction) (HCC)   HTN (hypertension)   HLD (hyperlipidemia)   DM (diabetes mellitus) (HCC)   Asthma, chronic   Polycythemia vera (HCC)   Nutrition Problem: Inadequate oral intake Etiology: acute illness  Signs/Symptoms: meal completion < 25%   Body mass index is 33.54 kg/m.   Possible CAP  Asthma exacerbation  Metapnemovirus + Chronic hypoxic respiratory failure - 2L  Patient recently treated at [facility] for asthma exacerbation and viral bronchitis (metapneumovirus +). She was discharged with albuterol  and steroid course Presented with worsening SOB, elevated D-dimer, VQ neg for PE. CXR with small opacity adjacent to elevated diaphragm.  Completed abx course, prednisone  x 5 days Continue bronchodilators as needed    Left popliteal vein DVT Suspect due to immobility elevated D-dimer, VQ neg for PE US  lower extremity  shows nonocclusive acute DVT in left popliteal vein Continue Eliquis .  She is allergic to heparin /Lovenox     Acute on chronic HFrEF Resolved Appears euvolemic  Echo shows EF 40 to 45%, LV global hypokinesis.  LA moderately dilated, trivial MR. Continue torsemide  from daily dosing to every other day dosing.  Continue metoprolol . S/p treatment with IV Lasix . 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  Improved.  Cr peaked 1.60    Latest Ref Rng & Units 01/03/2024    8:27 AM 01/02/2024    3:34 AM 01/01/2024    4:36 AM  BMP  Glucose 70 - 99 mg/dL 801  770  840   BUN 8 - 23 mg/dL 39  45  49   Creatinine 0.44 - 1.00 mg/dL 9.11  9.02  8.91   Sodium 135 - 145 mmol/L 131  129  128   Potassium 3.5 - 5.1 mmol/L 4.0  4.0  3.9   Chloride 98 - 111 mmol/L 96  96  94   CO2 22 - 32 mmol/L 24  22  22    Calcium  8.9 - 10.3 mg/dL 8.9  8.4  8.6      Hyponatremia Improved.  Sodium slowly increased from 126-129-131.  Unclear etiology Continue fluid restriction    T2DM with hyperglycemia Steroid-induced hyperglycemia HbA1c 7.8 Completed prednisone  BG improved.  Continue Lantus  at 12 units daily and NovoLog  6 units 3 times daily with meals. Monitor glucose levels and adjust insulin  accordingly.    Acute urinary retention:  Bladder scan showed 935 mL of urine.  In and out urethral catheterization produced  1 L of urine on 01/06/2024.   She has been able to urinate spontaneously (multiple times) after transient urethral catheterization for urinary retention.    Left ankle osteoarthritis Has chronic pain XR ankle - no fracture Use Tylenol  as needed.  Patient takes courses of tramadol  intermittently outpatient Will resume tramadol      Left wrist pain, left shoulder pain XR Left wrist no acute fracture History right shoulder showed mild acromioclavicular degenerative change, mild subcortical cystic change in the lateral humeral head. Analgesics as needed for  pain.   H/o Polycythemia vera  Hgb around 9.5. Noted to be 12-13 during recent hospitalization. No obvious bleeding noted, denies active bleeding/melena Iron panel consistent with ACD, b12 and folate WNL  Continue hydroxyurea  Will transition patient to daily dosing of PPI in the a.m. Per her last hematology oncology note, patient should be on 1 tab twice daily of hydroxyurea  Monday and Tuesday, and 1 tab daily Wednesday through Sunday, to minimize cytopenias    Constipation Laxatives as needed     Comorbidities include hypertension, hyperlipidemia   Awaiting placement to SNF   Diet Order             Diet Carb Modified Room service appropriate? Yes; Fluid restriction: 1500 mL Fluid  Diet effective now                     Consultants: Nephrologist  Procedures: None    Medications:    apixaban  5 mg Oral BID   vitamin C  500 mg Oral BID   aspirin EC  81 mg Oral Daily   atorvastatin  10 mg Oral Daily   cycloSPORINE  1 drop Both Eyes BID   feeding supplement (GLUCERNA SHAKE)  237 mL Oral TID BM   fluticasone furoate-vilanterol  1 puff Inhalation Daily   hydroxyurea  500 mg Oral BID   insulin aspart  0-5 Units Subcutaneous QHS   insulin aspart  0-9 Units Subcutaneous TID WC   insulin aspart  6 Units Subcutaneous TID WC   insulin glargine  12 Units Subcutaneous Daily   lidocaine  1 patch Transdermal Q24H   LORazepam  0.25 mg Oral BID   metoprolol succinate  50 mg Oral QHS   multivitamin with minerals  1 tablet Oral Daily   pantoprazole  40 mg Oral BID AC   torsemide  10 mg Oral QODAY   zinc sulfate (50mg elemental zinc)  220 mg Oral Daily   Continuous Infusions:   Anti-infectives (From admission, onward)    Start     Dose/Rate Route Frequency Ordered Stop   12/24/23 1000  azithromycin (ZITHROMAX) tablet 500 mg        500 mg Oral Daily 12/23/23 1220 12/25/23 1001   12/21/23 0745  cefTRIAXone (ROCEPHIN) 2 g in sodium chloride 0.9 % 100 mL IVPB  Status:   Discontinued        2 g 200 mL/hr over 30 Minutes Intravenous Every 24 hours 12/21/23 0744 12/21/23 0746   12/21/23 0745  azithromycin (ZITHROMAX) 500 mg in sodium chloride 0.9 % 250 mL IVPB  Status:  Discontinued        50 0 mg 250 mL/hr over 60 Minutes Intravenous Every 24 hours 12/21/23 0744 12/21/23 0746   12/21/23 0630  cefTRIAXone  (ROCEPHIN ) 2 g in sodium chloride  0.9 % 100 mL IVPB        2 g 200 mL/hr over 30 Minutes Intravenous Every 24 hours 12/21/23 0629 12/25/23 9386  12/21/23 0630  azithromycin  (ZITHROMAX ) 500 mg in sodium chloride  0.9 % 250 mL IVPB  Status:  Discontinued        500 mg 250 mL/hr over 60 Minutes Intravenous Every 24 hours 12/21/23 0629 12/23/23 1220              Family Communication/Anticipated D/C date and plan/Code Status   DVT prophylaxis: SCDs Start: 12/21/23 0740 apixaban  (ELIQUIS ) tablet 5 mg     Code Status: Limited: Do not attempt resuscitation (DNR) -DNR-LIMITED -Do Not Intubate/DNI   Family Communication: None Disposition Plan: Plan to discharge to SNF   Status is: Inpatient Remains inpatient appropriate because: Awaiting placement to SNF    Subjective:   Interval events noted. No new complaints or acute events overnight.   Objective:    Vitals:   01/08/24 0403 01/08/24 0500 01/08/24 0754 01/08/24 1603  BP: 128/63  133/71 129/61  Pulse: 90  84 100  Resp: 18  16 16   Temp: (!) 97.5 F (36.4 C)  97.9 F (36.6 C) 97.8 F (36.6 C)  TempSrc:   Oral Oral  SpO2: 94%  96% 95%  Weight:  77.9 kg    Height:       No data found.   Intake/Output Summary (Last 24 hours) at 01/08/2024 1605 Last data filed at 01/08/2024 1447 Gross per 24 hour  Intake 480 ml  Output 500 ml  Net -20 ml    Filed Weights   01/05/24 0500 01/07/24 0500 01/08/24 0500  Weight: 78 kg 77.9 kg 77.9 kg    Exam:  Physical Exam  Constitutional: In no distress.  Cardiovascular: Normal rate, regular rhythm. No lower extremity edema  Pulmonary: Non  labored breathing on [place], no wheezing, rales at bilateral bases  Abdominal: Soft. Non distended and non tender Neurological: Alert and oriented to person, place, and time. Non focal  Skin: Skin is warm and dry.      Data Reviewed:   I have personally reviewed following labs and imaging studies:  Labs: Labs show the following:   Basic Metabolic Panel: Recent Labs  Lab 01/02/24 0334 01/03/24 0827  NA 129* 131*  K 4.0 4.0  CL 96* 96*  CO2 22 24  GLUCOSE 229* 198*  BUN 45* 39*  CREATININE 0.97 0.88  CALCIUM  8.4* 8.9   GFR Estimated Creatinine Clearance: 43.2 mL/min (by C-G formula based on SCr of 0.88 mg/dL). Liver Function Tests: No results for input(s): AST, ALT, ALKPHOS, BILITOT, PROT, ALBUMIN  in the last 168 hours. No results for input(s): LIPASE, AMYLASE in the last 168 hours. No results for input(s): AMMONIA in the last 168 hours. Coagulation profile No results for input(s): INR, PROTIME in the last 168 hours.  CBC: Recent Labs  Lab 01/02/24 0334 01/07/24 0536  WBC 7.9 3.5*  HGB 9.5* 10.1*  HCT 28.5* 32.2*  MCV 106.3* 109.5*  PLT 290 361   Cardiac Enzymes: No results for input(s): CKTOTAL, CKMB, CKMBINDEX, TROPONINI in the last 168 hours. BNP (last 3 results) Recent Labs    12/22/23 1247 12/25/23 1343  PROBNP 418.0* 6,107.0*   CBG: Recent Labs  Lab 01/07/24 1124 01/07/24 1545 01/07/24 2217 01/08/24 0848 01/08/24 1154  GLUCAP 155* 243* 171* 142* 146*   D-Dimer: No results for input(s): DDIMER in the last 72 hours. Hgb A1c: No results for input(s): HGBA1C in the last 72 hours. Lipid Profile: No results for input(s): CHOL, HDL, LDLCALC, TRIG, CHOLHDL, LDLDIRECT in the last 72 hours. Thyroid function studies: No results  for input(s): TSH, T4TOTAL, T3FREE, THYROIDAB in the last 72 hours.  Invalid input(s): FREET3 Anemia work up: No results for input(s): VITAMINB12, FOLATE,  FERRITIN, TIBC, IRON, RETICCTPCT in the last 72 hours. Sepsis Labs: Recent Labs  Lab 01/02/24 0334 01/07/24 0536  WBC 7.9 3.5*    Microbiology No results found for this or any previous visit (from the past 240 hours).  Procedures and diagnostic studies:  No results found.    LOS: 18 days   Jacqueline Arroyo [employer] on [email or web address]. If 7PM-7AM, please contact night-coverage at [email or web address]     01/08/2024, 4:05 PM           "

## 2024-01-09 DIAGNOSIS — J189 Pneumonia, unspecified organism: Secondary | ICD-10-CM | POA: Diagnosis not present

## 2024-01-09 LAB — CBC WITH DIFFERENTIAL/PLATELET
Abs Immature Granulocytes: 0.14 K/uL — ABNORMAL HIGH (ref 0.00–0.07)
Basophils Absolute: 0 K/uL (ref 0.0–0.1)
Basophils Relative: 0 %
Eosinophils Absolute: 0 K/uL (ref 0.0–0.5)
Eosinophils Relative: 0 %
HCT: 32.7 % — ABNORMAL LOW (ref 36.0–46.0)
Hemoglobin: 10.2 g/dL — ABNORMAL LOW (ref 12.0–15.0)
Immature Granulocytes: 4 %
Lymphocytes Relative: 24 %
Lymphs Abs: 0.8 K/uL (ref 0.7–4.0)
MCH: 34.2 pg — ABNORMAL HIGH (ref 26.0–34.0)
MCHC: 31.2 g/dL (ref 30.0–36.0)
MCV: 109.7 fL — ABNORMAL HIGH (ref 80.0–100.0)
Monocytes Absolute: 0.5 K/uL (ref 0.1–1.0)
Monocytes Relative: 14 %
Neutro Abs: 2 K/uL (ref 1.7–7.7)
Neutrophils Relative %: 58 %
Platelets: 342 K/uL (ref 150–400)
RBC: 2.98 MIL/uL — ABNORMAL LOW (ref 3.87–5.11)
RDW: 13.6 % (ref 11.5–15.5)
WBC: 3.5 K/uL — ABNORMAL LOW (ref 4.0–10.5)
nRBC: 0 % (ref 0.0–0.2)

## 2024-01-09 LAB — BASIC METABOLIC PANEL WITH GFR
Anion gap: 11 (ref 5–15)
BUN: 32 mg/dL — ABNORMAL HIGH (ref 8–23)
CO2: 25 mmol/L (ref 22–32)
Calcium: 9.7 mg/dL (ref 8.9–10.3)
Chloride: 102 mmol/L (ref 98–111)
Creatinine, Ser: 0.96 mg/dL (ref 0.44–1.00)
GFR, Estimated: 57 mL/min — ABNORMAL LOW
Glucose, Bld: 156 mg/dL — ABNORMAL HIGH (ref 70–99)
Potassium: 4.3 mmol/L (ref 3.5–5.1)
Sodium: 138 mmol/L (ref 135–145)

## 2024-01-09 LAB — PRO BRAIN NATRIURETIC PEPTIDE: Pro Brain Natriuretic Peptide: 699 pg/mL — ABNORMAL HIGH

## 2024-01-09 LAB — GLUCOSE, CAPILLARY
Glucose-Capillary: 158 mg/dL — ABNORMAL HIGH (ref 70–99)
Glucose-Capillary: 225 mg/dL — ABNORMAL HIGH (ref 70–99)
Glucose-Capillary: 187 mg/dL — ABNORMAL HIGH (ref 70–99)
Glucose-Capillary: 125 mg/dL — ABNORMAL HIGH (ref 70–99)

## 2024-01-09 MED ORDER — INSULIN GLARGINE 100 UNIT/ML ~~LOC~~ SOLN
14.0000 [IU] | Freq: Every day | SUBCUTANEOUS | Status: DC
  Administered 2024-01-10 – 2024-01-15 (×6): 14 [IU] via SUBCUTANEOUS
  Filled 2024-01-09 (×6): qty 0.14

## 2024-01-09 NOTE — Plan of Care (Signed)
" °  Problem: Fluid Volume: Goal: Hemodynamic stability will improve Outcome: Progressing   Problem: Clinical Measurements: Goal: Diagnostic test results will improve Outcome: Progressing Goal: Signs and symptoms of infection will decrease Outcome: Progressing   Problem: Respiratory: Goal: Ability to maintain adequate ventilation will improve Outcome: Progressing   Problem: Education: Goal: Ability to describe self-care measures that may prevent or decrease complications (Diabetes Survival Skills Education) will improve Outcome: Progressing Goal: Individualized Educational Video(s) Outcome: Progressing   Problem: Coping: Goal: Ability to adjust to condition or change in health will improve Outcome: Progressing   Problem: Fluid Volume: Goal: Ability to maintain a balanced intake and output will improve Outcome: Progressing   Problem: Health Behavior/Discharge Planning: Goal: Ability to identify and utilize available resources and services will improve Outcome: Progressing Goal: Ability to manage health-related needs will improve Outcome: Progressing   Problem: Metabolic: Goal: Ability to maintain appropriate glucose levels will improve Outcome: Progressing   Problem: Nutritional: Goal: Maintenance of adequate nutrition will improve Outcome: Progressing Goal: Progress toward achieving an optimal weight will improve Outcome: Progressing   Problem: Skin Integrity: Goal: Risk for impaired skin integrity will decrease Outcome: Progressing   Problem: Tissue Perfusion: Goal: Adequacy of tissue perfusion will improve Outcome: Progressing   Problem: Education: Goal: Knowledge of General Education information will improve Description: Including pain rating scale, medication(s)/side effects and non-pharmacologic comfort measures Outcome: Progressing   Problem: Health Behavior/Discharge Planning: Goal: Ability to manage health-related needs will improve Outcome:  Progressing   Problem: Clinical Measurements: Goal: Ability to maintain clinical measurements within normal limits will improve Outcome: Progressing Goal: Will remain free from infection Outcome: Progressing Goal: Diagnostic test results will improve Outcome: Progressing Goal: Respiratory complications will improve Outcome: Progressing Goal: Cardiovascular complication will be avoided Outcome: Progressing   Problem: Activity: Goal: Risk for activity intolerance will decrease Outcome: Progressing   Problem: Nutrition: Goal: Adequate nutrition will be maintained Outcome: Progressing   Problem: Coping: Goal: Level of anxiety will decrease Outcome: Progressing   Problem: Elimination: Goal: Will not experience complications related to bowel motility Outcome: Progressing Goal: Will not experience complications related to urinary retention Outcome: Progressing   Problem: Pain Managment: Goal: General experience of comfort will improve and/or be controlled Outcome: Progressing   Problem: Safety: Goal: Ability to remain free from injury will improve Outcome: Progressing   Problem: Skin Integrity: Goal: Risk for impaired skin integrity will decrease Outcome: Progressing   Problem: Activity: Goal: Ability to tolerate increased activity will improve Outcome: Progressing   Problem: Clinical Measurements: Goal: Ability to maintain a body temperature in the normal range will improve Outcome: Progressing   Problem: Respiratory: Goal: Ability to maintain adequate ventilation will improve Outcome: Progressing Goal: Ability to maintain a clear airway will improve Outcome: Progressing   "

## 2024-01-09 NOTE — Progress Notes (Signed)
 Physical Therapy Treatment Patient Details Name: Jacqueline Arroyo MRN: [id number] DOB: 09-16-1940 Today's Date: 01/09/2024   History of Present Illness Pt is a pleasant 84 y.o. female with medical history significant for diabetes, HTN, HLD, asthma, polycythemia vera, HFrEF with last EF was around 35 to 40% from echo in 2025 July, CKD who presented to ED at [facility] for worsening shortness of breath cough and generalized weakness. MD assessment includes:  Possible CAP, asthma exacerbation, metapnemovirus, AKI.    PT Comments  Pt received in bed, daughter at bedside assisting with bedside care. Pt able to roll L<>R with Rail and ModA. MinA to transfer Left sidelying to upright sitting with MinA and rail. Good static sitting balance. Attempted to don L ankle brace (for comfort) unable due to shoes. Pt able to stand from elevated bed to RW and side step with increased time to bedside chair. No dizziness, SpO2 94% on RA. Functional mobility appears to be progressing, anticipate pt will make great progress at STR.   If plan is discharge home, recommend the following: Assistance with cooking/housework;Assist for transportation;Two people to help with walking and/or transfers;A lot of help with bathing/dressing/bathroom   Can travel by private vehicle     No  Equipment Recommendations  Other (comment) (TBD)    Recommendations for Other Services       Precautions / Restrictions Precautions Precautions: Fall Recall of Precautions/Restrictions: Intact Precaution/Restrictions Comments: L wrist splint for pain control, prn Required Braces or Orthoses: Splint/Cast Splint/Cast: LUE/ wrist Restrictions Weight Bearing Restrictions Per Provider Order: No Other Position/Activity Restrictions: Chronic bilateral ankle pain     Mobility  Bed Mobility Overal bed mobility: Needs Assistance Bed Mobility: Supine to Sit     Supine to sit: Min assist, HOB elevated, Used rails          Transfers Overall  transfer level: Needs assistance Equipment used: Rolling walker (2 wheels) Transfers: Sit to/from Stand Sit to Stand: Min assist, From elevated surface           General transfer comment: Assist to wt shift forward and attain upright balance    Ambulation/Gait               General Gait Details: Pt able to take several small, shuffling steps at the EOB to chair with min A for stability and to guide the RW. Increased difficulty side stepping towards Left   Stairs             Wheelchair Mobility     Tilt Bed    Modified Rankin (Stroke Patients Only)       Balance Overall balance assessment: Needs assistance Sitting-balance support: Feet supported Sitting balance-Leahy Scale: Fair     Standing balance support: Bilateral upper extremity supported, During functional activity, Reliant on assistive device for balance Standing balance-Leahy Scale: Poor                              Communication Communication Communication: No apparent difficulties  Cognition Arousal: Alert Behavior During Therapy: WFL for tasks assessed/performed, Flat affect   PT - Cognitive impairments: No apparent impairments                       PT - Cognition Comments: Pt is A and O x 4. Daughter present at bedside. Following commands: Intact      Cueing Cueing Techniques: Verbal cues, Tactile cues  Exercises  General Comments General comments (skin integrity, edema, etc.):  (pressure wound noted at Right buttocks)      Pertinent Vitals/Pain Pain Assessment Pain Assessment: Faces Faces Pain Scale: Hurts a little bit Pain Location:  (L ankle) Pain Descriptors / Indicators: Sore Pain Intervention(s): Monitored during session    Home Living                          Prior Function            PT Goals (current goals can now be found in the care plan section) Acute Rehab PT Goals Patient Stated Goal: rehab then home     Frequency    Min 2X/week      PT Plan      Co-evaluation              AM-PAC PT 6 Clicks Mobility   Outcome Measure  Help needed turning from your back to your side while in a flat bed without using bedrails?: A Lot Help needed moving from lying on your back to sitting on the side of a flat bed without using bedrails?: A Little Help needed moving to and from a bed to a chair (including a wheelchair)?: A Little Help needed standing up from a chair using your arms (e.g., wheelchair or bedside chair)?: A Little Help needed to walk in hospital room?: Total Help needed climbing 3-5 steps with a railing? : Total 6 Click Score: 13    End of Session Equipment Utilized During Treatment: Oxygen;Gait belt Activity Tolerance: Patient tolerated treatment well Patient left: in chair;with call bell/phone within reach;with family/visitor present Nurse Communication: Mobility status PT Visit Diagnosis: Difficulty in walking, not elsewhere classified (R26.2);Muscle weakness (generalized) (M62.81);Pain Pain - Right/Left: Left Pain - part of body: Ankle and joints of foot     Time: 1050-1126 PT Time Calculation (min) (ACUTE ONLY): 36 min  Charges:    $Therapeutic Activity: 38-52 mins PT General Charges $$ ACUTE PT VISIT: 1 Visit                    Darice Bohr, PTA  [name] 01/09/2024, 1:11 PM

## 2024-01-09 NOTE — Consult Note (Addendum)
 WOC Nurse Consult Note: Reason for Consult: Requested to assess a wound on sacrum. Wound type: PI stage 2 to Right buttock. Partial thickness. Pressure Injury POA: No Measurement: 2 cm x 1 cm x 0.1 cm. Wound bed: 100% pink, superficial yellow slough and peeling skin. Drainage (amount, consistency, odor) scant amount, serous. Periwound: erythema blanchable (see nursing flowsheet) Dressing procedure/placement/frequency: Cleanse with saline, pat dry. Apply a single layer of Xeroform daily to the wound bed, foam dressing on top. The foam can remain 3 days if not saturated or soiled. Ok to lift and reapply.    WOC team will not plan to follow further. Please reconsult if further assistance is needed. Thank-you,  Lela Holm MSN, RN, CWCN, CNS.  (Phone [phone])

## 2024-01-09 NOTE — TOC Progression Note (Addendum)
 Transition of Care [facility]) - Progression Note    Patient Details  Name: Jacqueline Arroyo MRN: [id number] Date of Birth: [birth year]  [facility] CM/SW Contact  Dalia GORMAN Fuse, RN Phone Number: 01/09/2024, 9:48 AM  Clinical Narrative:    TOC placed a call to [facility] to check the status of the auth request for [facility]. They received the request on 01/03/24 but it is still pending at this time. The case has not been assigned to a nurse. Per [facility] the needed clinical information is attached and once it is assigned the clinical should be reviewed. [employer] did not provide an ETA, but shared they would try to have the case assigned today.  Emmalene Hertz  Auth requested on 01/03/2024 Reference # [id number]  11:35 AM TOC met with the patient and her daughter, [name], in the room and advised ins shara is still pending.  TOC will continue to follow.   Expected Discharge Plan: Home w Home Health Services Barriers to Discharge: Continued Medical Work up               Expected Discharge Plan and Services In-house Referral: Clinical Social Work Discharge Planning Services: CM Consult   Living arrangements for the past 2 months: Apartment                                       Social Drivers of Health (SDOH) Interventions SDOH Screenings   Food Insecurity: Patient Declined (12/22/2023)  Housing: Patient Declined (12/22/2023)  Transportation Needs: Patient Declined (12/22/2023)  Utilities: Patient Declined (12/22/2023)  Financial Resource Strain: Low Risk (12/16/2023)   Received from [facility]  Physical Activity: Insufficiently Active (06/20/2023)   Received from [facility]  Social Connections: Patient Declined (12/22/2023)  Stress: No Stress Concern Present (06/20/2023)   Received from [facility]  Tobacco Use: Low Risk (12/21/2023)  Health Literacy: Low Risk (06/20/2023)   Received from [facility]    Readmission Risk Interventions     12/31/2023    4:35 PM  Readmission Risk Prevention Plan  Transportation Screening Complete  PCP or Specialist Appt within 5-7 Days Complete  Home Care Screening Complete  Medication Review (RN CM) Complete

## 2024-01-09 NOTE — Progress Notes (Signed)
 "    Progress Note    Jacqueline Arroyo  FMW:[id number] DOB: October 25, 1940  DOA: 12/21/2023 PCP: [facility]      Brief Narrative:    Medical records reviewed and are as summarized below:  Jacqueline Arroyo is a 84 y.o. female  with medical history significant for mild on surveillance, diabetes, HTN, HLD, asthma, polycythemia vera, HFrEF with last EF was around 35 to 40% from echo in 2025 July, CKD who presented to ED at [facility] for worsening shortness of breath cough and generalized weakness.  Patient had a recent hospitalization at [facility] and was discharged from the hospital on 12/16/2023 after being diagnosed with bronchitis and exacerbation of asthma.  She presented on 12/19  with worsening respiratory symptoms after she went home along with some chills, generalized weakness and worsening nausea.  Admitted for possible community-acquired pneumonia, asthma exacerbation, metapneumovirus infection.       Assessment/Plan:   Principal Problem:   CAP (community acquired pneumonia) Active Problems:   HFrEF (heart failure with reduced ejection fraction) (HCC)   HTN (hypertension)   HLD (hyperlipidemia)   DM (diabetes mellitus) (HCC)   Asthma, chronic   Polycythemia vera (HCC)   Nutrition Problem: Inadequate oral intake Etiology: acute illness  Signs/Symptoms: meal completion < 25%   Body mass index is 33.54 kg/m.   Possible CAP  Asthma exacerbation  Metapnemovirus + Chronic hypoxic respiratory failure - 2L [place] Patient recently treated at [facility] for asthma exacerbation and viral bronchitis (metapneumovirus +). She was discharged with albuterol  and steroid course Presented with worsening SOB, elevated D-dimer, VQ neg for PE. CXR with small opacity adjacent to elevated diaphragm.  Completed abx course, prednisone  x 5 days Continue bronchodilators as needed    Left popliteal vein DVT Suspect due to immobility elevated D-dimer, VQ neg for PE US  lower extremity  shows nonocclusive acute DVT in left popliteal vein Continue Eliquis .  She is allergic to heparin /Lovenox     Acute on chronic HFrEF Resolved Appears euvolemic  Echo shows EF 40 to 45%, LV global hypokinesis.  LA moderately dilated, trivial MR. Continue torsemide  from daily dosing to every other day dosing.  Continue metoprolol . S/p treatment with IV Lasix . 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  Improved.  Cr peaked 1.60    Latest Ref Rng & Units 01/09/2024    5:37 AM 01/03/2024    8:27 AM 01/02/2024    3:34 AM  BMP  Glucose 70 - 99 mg/dL 843  801  770   BUN 8 - 23 mg/dL 32  39  45   Creatinine 0.44 - 1.00 mg/dL 9.03  9.11  9.02   Sodium 135 - 145 mmol/L 138  131  129   Potassium 3.5 - 5.1 mmol/L 4.3  4.0  4.0   Chloride 98 - 111 mmol/L 102  96  96   CO2 22 - 32 mmol/L 25  24  22    Calcium  8.9 - 10.3 mg/dL 9.7  8.9  8.4      Hyponatremia Resolved.     T2DM with hyperglycemia Steroid-induced hyperglycemia HbA1c 7.8 Completed prednisone  BG improved.  Continue Lantus  at 12 units daily and NovoLog  6 units 3 times daily with meals. Monitor glucose levels and adjust insulin  accordingly. CBG (last 3)  Recent Labs    01/08/24 2121 01/09/24 0816 01/09/24 1200  GLUCAP 162* 158* 225*      Acute urinary retention: Resolved  Bladder scan  showed 935 mL of urine.  In and out urethral catheterization produced 1 L of urine on 01/06/2024.   She has been able to urinate spontaneously (multiple times) after transient urethral catheterization for urinary retention.    Left ankle osteoarthritis Has chronic pain XR ankle - no fracture Use Tylenol  as needed.  Patient takes courses of tramadol  intermittently outpatient Continue tramadol      Left wrist pain, left shoulder pain XR Left wrist no acute fracture History right shoulder showed mild acromioclavicular degenerative change, mild subcortical cystic change in the lateral humeral head. Analgesics as  needed for pain.   H/o Polycythemia vera  Hgb around 9.5. Noted to be 12-13 during recent hospitalization. No obvious bleeding noted, denies active bleeding/melena Iron panel consistent with ACD, b12 and folate WNL  Continue hydroxyurea  Per her last hematology oncology note, patient should be on 1 tab twice daily of hydroxyurea  Monday and Tuesday, and 1 tab daily Wednesday through Sunday, to minimize cytopenias Continue reduced dose schedule     Constipation Laxatives as needed     Comorbidities include hypertension, hyperlipidemia   Awaiting placement to SNF   Diet Order             Diet Carb Modified Room service appropriate? Yes; Fluid restriction: 1500 mL Fluid  Diet effective now                     Consultants: Nephrologist  Procedures: None    Medications:    apixaban  5 mg Oral BID   vitamin C  500 mg Oral BID   aspirin EC  81 mg Oral Daily   atorvastatin  10 mg Oral Daily   cycloSPORINE  1 drop Both Eyes BID   feeding supplement (GLUCERNA SHAKE)  237 mL Oral TID BM   fluticasone furoate-vilanterol  1 puff Inhalation Daily   hydroxyurea  500 mg Oral Daily   insulin aspart  0-5 Units Subcutaneous QHS   insulin aspart  0-9 Units Subcutaneous TID WC   insulin aspart  6 Units Subcutaneous TID WC   insulin glargine  12 Units Subcutaneous Daily   lidocaine  1 patch Transdermal Q24H   LORazepam  0.25 mg Oral BID   metoprolol succinate  50 mg Oral QHS   multivitamin with minerals  1 tablet Oral Daily   pantoprazole  40 mg Oral BID AC   torsemide  10 mg Oral QODAY   zinc sulfate (50mg elemental zinc)  220 mg Oral Daily   Continuous Infusions:   Anti-infectives (From admission, onward)    Start     Dose/Rate Route Frequency Ordered Stop   12/24/23 1000  azithromycin (ZITHROMAX) tablet 500 mg        500 mg Oral Daily 12/23/23 1220 12/25/23 1001   12/21/23 0745  cefTRIAXone (ROCEPHIN) 2 g in sodium chloride 0.9 % 100 mL IVPB  Status:  Discontinued         2 g 200 mL/hr over 30 Minutes Intravenous Every 24 hours 12/21/23 0744 12/21/23 0746   12/21/23 0745  azithromycin (ZITHROMAX) 500 mg in sodium chloride 0.9 % 250 mL IVPB  Status:  Discontinued        50 0 mg 250 mL/hr over 60 Minutes Intravenous Every 24 hours 12/21/23 0744 12/21/23 0746   12/21/23 0630  cefTRIAXone  (ROCEPHIN ) 2 g in sodium chloride  0.9 % 100 mL IVPB        2 g 200 mL/hr over 30 Minutes Intravenous Every 24 hours 12/21/23  9370 12/25/23 9386   12/21/23 0630  azithromycin  (ZITHROMAX ) 500 mg in sodium chloride  0.9 % 250 mL IVPB  Status:  Discontinued        500 mg 250 mL/hr over 60 Minutes Intravenous Every 24 hours 12/21/23 0629 12/23/23 1220              Family Communication/Anticipated D/C date and plan/Code Status   DVT prophylaxis: SCDs Start: 12/21/23 0740 apixaban  (ELIQUIS ) tablet 5 mg     Code Status: Limited: Do not attempt resuscitation (DNR) -DNR-LIMITED -Do Not Intubate/DNI   Family Communication: None Disposition Plan: Plan to discharge to SNF   Status is: Inpatient Remains inpatient appropriate because: Awaiting placement to SNF    Subjective:   Interval events noted.  Continues to have discomfort due to arthritis, otherwise no acute events.  Objective:    Vitals:   01/08/24 1603 01/08/24 1934 01/09/24 0417 01/09/24 0816  BP: 129/61 131/72 115/66 132/68  Pulse: 100 (!) 109 98 96  Resp: 16 17 16 18   Temp: 97.8 F (36.6 C) 97.7 F (36.5 C) 97.8 F (36.6 C) 98.5 F (36.9 C)  TempSrc: Oral Oral Oral Oral  SpO2: 95% 93% 93% 92%  Weight:      Height:       No data found.   Intake/Output Summary (Last 24 hours) at 01/09/2024 1232 Last data filed at 01/09/2024 0900 Gross per 24 hour  Intake 720 ml  Output --  Net 720 ml    Filed Weights   01/05/24 0500 01/07/24 0500 01/08/24 0500  Weight: 78 kg 77.9 kg 77.9 kg    Exam:  Constitutional: In no distress.  Sitting up in chair. Cardiovascular: Normal rate, regular  rhythm. No lower extremity edema  Pulmonary: Non labored breathing on nasal cannula, no wheezing or rales.   Abdominal: Soft. Non distended and non tender Musculoskeletal: Normal range of motion.     Neurological: Alert and oriented to person, place, and time. Non focal  Skin: Skin is warm and dry.   Data Reviewed:   I have personally reviewed following labs and imaging studies:  Labs: Labs show the following:   Basic Metabolic Panel: Recent Labs  Lab 01/03/24 0827 01/09/24 0537  NA 131* 138  K 4.0 4.3  CL 96* 102  CO2 24 25  GLUCOSE 198* 156*  BUN 39* 32*  CREATININE 0.88 0.96  CALCIUM  8.9 9.7   GFR Estimated Creatinine Clearance: 39.6 mL/min (by C-G formula based on SCr of 0.96 mg/dL). Liver Function Tests: No results for input(s): AST, ALT, ALKPHOS, BILITOT, PROT, ALBUMIN  in the last 168 hours. No results for input(s): LIPASE, AMYLASE in the last 168 hours. No results for input(s): AMMONIA in the last 168 hours. Coagulation profile No results for input(s): INR, PROTIME in the last 168 hours.  CBC: Recent Labs  Lab 01/07/24 0536 01/09/24 0537  WBC 3.5* 3.5*  NEUTROABS  --  2.0  HGB 10.1* 10.2*  HCT 32.2* 32.7*  MCV 109.5* 109.7*  PLT 361 342   Cardiac Enzymes: No results for input(s): CKTOTAL, CKMB, CKMBINDEX, TROPONINI in the last 168 hours. BNP (last 3 results) Recent Labs    12/22/23 1247 12/25/23 1343 01/09/24 0537  PROBNP 418.0* 6,107.0* 699.0*   CBG: Recent Labs  Lab 01/08/24 1154 01/08/24 1606 01/08/24 2121 01/09/24 0816 01/09/24 1200  GLUCAP 146* 148* 162* 158* 225*   D-Dimer: No results for input(s): DDIMER in the last 72 hours. Hgb A1c: No results for input(s): HGBA1C in  the last 72 hours. Lipid Profile: No results for input(s): CHOL, HDL, LDLCALC, TRIG, CHOLHDL, LDLDIRECT in the last 72 hours. Thyroid function studies: No results for input(s): TSH, T4TOTAL, T3FREE, THYROIDAB  in the last 72 hours.  Invalid input(s): FREET3 Anemia work up: No results for input(s): VITAMINB12, FOLATE, FERRITIN, TIBC, IRON, RETICCTPCT in the last 72 hours. Sepsis Labs: Recent Labs  Lab 01/07/24 0536 01/09/24 0537  WBC 3.5* 3.5*    Microbiology No results found for this or any previous visit (from the past 240 hours).  Procedures and diagnostic studies:  No results found.    LOS: 19 days   Alban Pepper MD [facility]   Pager on [email or web address]. If 7PM-7AM, please contact night-coverage at [email or web address]     01/09/2024, 12:32 PM           "

## 2024-01-10 DIAGNOSIS — J189 Pneumonia, unspecified organism: Secondary | ICD-10-CM | POA: Diagnosis not present

## 2024-01-10 DIAGNOSIS — J45901 Unspecified asthma with (acute) exacerbation: Secondary | ICD-10-CM

## 2024-01-10 LAB — BASIC METABOLIC PANEL WITH GFR
Anion gap: 9 (ref 5–15)
BUN: 32 mg/dL — ABNORMAL HIGH (ref 8–23)
CO2: 24 mmol/L (ref 22–32)
Calcium: 9.3 mg/dL (ref 8.9–10.3)
Chloride: 106 mmol/L (ref 98–111)
Creatinine, Ser: 1.04 mg/dL — ABNORMAL HIGH (ref 0.44–1.00)
GFR, Estimated: 52 mL/min — ABNORMAL LOW
Glucose, Bld: 140 mg/dL — ABNORMAL HIGH (ref 70–99)
Potassium: 4.1 mmol/L (ref 3.5–5.1)
Sodium: 139 mmol/L (ref 135–145)

## 2024-01-10 LAB — GLUCOSE, CAPILLARY
Glucose-Capillary: 149 mg/dL — ABNORMAL HIGH (ref 70–99)
Glucose-Capillary: 180 mg/dL — ABNORMAL HIGH (ref 70–99)
Glucose-Capillary: 166 mg/dL — ABNORMAL HIGH (ref 70–99)
Glucose-Capillary: 182 mg/dL — ABNORMAL HIGH (ref 70–99)

## 2024-01-10 LAB — CBC
HCT: 30.6 % — ABNORMAL LOW (ref 36.0–46.0)
Hemoglobin: 9.8 g/dL — ABNORMAL LOW (ref 12.0–15.0)
MCH: 35.3 pg — ABNORMAL HIGH (ref 26.0–34.0)
MCHC: 32 g/dL (ref 30.0–36.0)
MCV: 110.1 fL — ABNORMAL HIGH (ref 80.0–100.0)
Platelets: 297 K/uL (ref 150–400)
RBC: 2.78 MIL/uL — ABNORMAL LOW (ref 3.87–5.11)
RDW: 13.8 % (ref 11.5–15.5)
WBC: 3.7 K/uL — ABNORMAL LOW (ref 4.0–10.5)
nRBC: 0 % (ref 0.0–0.2)

## 2024-01-10 MED ORDER — MEDIHONEY WOUND/BURN DRESSING EX PSTE
1.0000 | PASTE | Freq: Two times a day (BID) | CUTANEOUS | Status: DC
  Administered 2024-01-10 – 2024-01-15 (×10): 1 via TOPICAL
  Filled 2024-01-10: qty 44

## 2024-01-10 MED ORDER — MEDIHONEY WOUND/BURN DRESSING EX PSTE: 1.0000 | PASTE | Freq: Every day | CUTANEOUS | Status: DC

## 2024-01-10 NOTE — TOC Progression Note (Addendum)
 Transition of Care [facility]) - Progression Note    Patient Details  Name: Jacqueline Arroyo MRN: [id number] Date of Birth: 29-Mar-1940  Transition [facility]) CM/SW Contact  Dalia GORMAN Fuse, RN Phone Number: 01/10/2024, 9:14 AM  Clinical Narrative:     Still awaiting [employer] auth for [employer]. TOC spoke with the patient and her daughter [name] in the room yesterday to make them aware that shara is still pending.  1500: TOC placed call to [facility] and was told shara is still pending. The auth request is sitting with [facility]. TOC will continue to follow.   TOC will continue to follow.  Expected Discharge Plan: Home w Home Health Services Barriers to Discharge: Continued Medical Work up               Expected Discharge Plan and Services In-house Referral: Clinical Social Work Discharge Planning Services: CM Consult   Living arrangements for the past 2 months: Apartment                                       Social Drivers of Health (SDOH) Interventions SDOH Screenings   Food Insecurity: Patient Declined (12/22/2023)  Housing: Patient Declined (12/22/2023)  Transportation Needs: Patient Declined (12/22/2023)  Utilities: Patient Declined (12/22/2023)  Financial Resource Strain: Low Risk (12/16/2023)   Received from [facility]  Physical Activity: Insufficiently Active (06/20/2023)   Received from [facility]  Social Connections: Patient Declined (12/22/2023)  Stress: No Stress Concern Present (06/20/2023)   Received from [facility]  Tobacco Use: Low Risk (12/21/2023)  Health Literacy: Low Risk (06/20/2023)   Received from [facility]    Readmission Risk Interventions    12/31/2023    4:35 PM  Readmission Risk Prevention Plan  Transportation Screening Complete  PCP or Specialist Appt within 5-7 Days Complete  Home Care Screening Complete  Medication Review (RN CM) Complete

## 2024-01-10 NOTE — Plan of Care (Signed)
" °  Problem: Education: Goal: Ability to describe self-care measures that may prevent or decrease complications (Diabetes Survival Skills Education) will improve Outcome: Progressing   Problem: Coping: Goal: Ability to adjust to condition or change in health will improve Outcome: Progressing   Problem: Nutritional: Goal: Maintenance of adequate nutrition will improve Outcome: Progressing   Problem: Skin Integrity: Goal: Risk for impaired skin integrity will decrease Outcome: Progressing   Problem: Tissue Perfusion: Goal: Adequacy of tissue perfusion will improve Outcome: Progressing   Problem: Education: Goal: Knowledge of General Education information will improve Description: Including pain rating scale, medication(s)/side effects and non-pharmacologic comfort measures Outcome: Progressing   Problem: Activity: Goal: Risk for activity intolerance will decrease Outcome: Progressing   Problem: Nutrition: Goal: Adequate nutrition will be maintained Outcome: Progressing   Problem: Coping: Goal: Level of anxiety will decrease Outcome: Progressing   Problem: Elimination: Goal: Will not experience complications related to bowel motility Outcome: Progressing   Problem: Pain Managment: Goal: General experience of comfort will improve and/or be controlled Outcome: Progressing   Problem: Safety: Goal: Ability to remain free from injury will improve Outcome: Progressing   "

## 2024-01-10 NOTE — Progress Notes (Signed)
 "    Progress Note    Jacqueline Arroyo  FMW:[id number] DOB: [birth year]  DOA: 12/21/2023 PCP: [facility]      Brief Narrative:    Medical records reviewed and are as summarized below:  Jacqueline Arroyo is a 84 y.o. female  with medical history significant for mild on surveillance, diabetes, HTN, HLD, asthma, polycythemia vera, HFrEF with last EF was around 35 to 40% from echo in 2025 July, CKD who presented to ED at [facility] for worsening shortness of breath cough and generalized weakness.  Patient had a recent hospitalization at [facility] and was discharged from the hospital on 12/16/2023 after being diagnosed with bronchitis and exacerbation of asthma.  She presented on 12/19  with worsening respiratory symptoms after she went home along with some chills, generalized weakness and worsening nausea.  Admitted for possible community-acquired pneumonia, asthma exacerbation, metapneumovirus infection.       Assessment/Plan:   Principal Problem:   CAP (community acquired pneumonia) Active Problems:   HFrEF (heart failure with reduced ejection fraction) (HCC)   HTN (hypertension)   HLD (hyperlipidemia)   DM (diabetes mellitus) (HCC)   Asthma, chronic   Polycythemia vera (HCC)   Nutrition Problem: Inadequate oral intake Etiology: acute illness  Signs/Symptoms: meal completion < 25%   Body mass index is 30.87 kg/m.   Possible CAP  Asthma exacerbation  Metapnemovirus + Chronic hypoxic respiratory failure - 2L [place] Patient recently treated at [facility] for asthma exacerbation and viral bronchitis (metapneumovirus +). She was discharged with albuterol  and steroid course Presented with worsening SOB, elevated D-dimer, VQ neg for PE. CXR with small opacity adjacent to elevated diaphragm.  Completed abx course, prednisone  x 5 days Continue bronchodilators as needed    Left popliteal vein DVT Suspect due to immobility elevated D-dimer, VQ neg for PE US  lower extremity  shows nonocclusive acute DVT in left popliteal vein Continue Eliquis .  She is allergic to heparin /Lovenox     Acute on chronic HFrEF Resolved Appears euvolemic  Echo shows EF 40 to 45%, LV global hypokinesis.  LA moderately dilated, trivial MR. Continue torsemide  from daily dosing to every other day dosing.  Continue metoprolol . S/p treatment with IV Lasix . 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  Serum creatinine mildly elevated this a.m.  Cr peaked 1.60    Latest Ref Rng & Units 01/10/2024    6:01 AM 01/09/2024    5:37 AM 01/03/2024    8:27 AM  BMP  Glucose 70 - 99 mg/dL 859  843  801   BUN 8 - 23 mg/dL 32  32  39   Creatinine 0.44 - 1.00 mg/dL 8.95  9.03  9.11   Sodium 135 - 145 mmol/L 139  138  131   Potassium 3.5 - 5.1 mmol/L 4.1  4.3  4.0   Chloride 98 - 111 mmol/L 106  102  96   CO2 22 - 32 mmol/L 24  25  24    Calcium  8.9 - 10.3 mg/dL 9.3  9.7  8.9   -Continue to monitor with daily BMP   Hyponatremia Resolved.     T2DM with hyperglycemia Steroid-induced hyperglycemia HbA1c 7.8 Completed prednisone  BG improved.  Continue Lantus  at 12 units daily and NovoLog  6 units 3 times daily with meals. Monitor glucose levels and adjust insulin  accordingly. CBG (last 3)  Recent Labs    01/09/24 2304 01/10/24 0828 01/10/24 1144  GLUCAP 125* 149* 180*  Acute urinary retention: Resolved  Bladder scan showed 935 mL of urine.  In and out urethral catheterization produced 1 L of urine on 01/06/2024.   She has been able to urinate spontaneously (multiple times) after transient urethral catheterization for urinary retention.    Left ankle osteoarthritis Has chronic pain XR ankle - no fracture Use Tylenol  as needed.  Patient takes courses of tramadol  intermittently outpatient Continue tramadol      Left wrist pain, left shoulder pain XR Left wrist no acute fracture History right shoulder showed mild acromioclavicular degenerative change, mild  subcortical cystic change in the lateral humeral head. Analgesics as needed for pain.   H/o Polycythemia vera  Hgb around 9.5. Noted to be 12-13 during recent hospitalization. No obvious bleeding noted, denies active bleeding/melena Iron panel consistent with ACD, b12 and folate WNL  Continue hydroxyurea  Per her last hematology oncology note, patient should be on 1 tab twice daily of hydroxyurea  Monday and Tuesday, and 1 tab daily Wednesday through Sunday, to minimize cytopenias Continue reduced dose schedule     Constipation Laxatives as needed    Comorbidities include hypertension, hyperlipidemia  Awaiting placement to SNF Diet Order             Diet Carb Modified Room service appropriate? Yes; Fluid restriction: 2000 mL Fluid  Diet effective now                   Consultants: Nephrologist  Procedures: None    Medications:    apixaban  5 mg Oral BID   vitamin C  500 mg Oral BID   aspirin EC  81 mg Oral Daily   atorvastatin  10 mg Oral Daily   cycloSPORINE  1 drop Both Eyes BID   feeding supplement (GLUCERNA SHAKE)  237 mL Oral TID BM   fluticasone furoate-vilanterol  1 puff Inhalation Daily   hydroxyurea  500 mg Oral Daily   insulin aspart  0-5 Units Subcutaneous QHS   insulin aspart  0-9 Units Subcutaneous TID WC   insulin aspart  6 Units Subcutaneous TID WC   insulin glargine  14 Units Subcutaneous Daily   leptospermum manuka honey  1 Application Topical BID   lidocaine  1 patch Transdermal Q24H   LORazepam  0.25 mg Oral BID   metoprolol succinate  50 mg Oral QHS   multivitamin with minerals  1 tablet Oral Daily   pantoprazole  40 mg Oral BID AC   torsemide  10 mg Oral QODAY   zinc sulfate (50mg elemental zinc)  220 mg Oral Daily   Continuous Infusions:   Anti-infectives (From admission, onward)    Start     Dose/Rate Route Frequency Ordered Stop   12/24/23 1000  azithromycin (ZITHROMAX) tablet 500 mg        500 mg Oral Daily 12/23/23 1220  12/25/23 1001   12/21/23 0745  cefTRIAXone (ROCEPHIN) 2 g in sodium chloride 0.9 % 100 mL IVPB  Status:  Discontinued        2 g 200 mL/hr over 30 Minutes Intravenous Every 24 hours 12/21/23 0744 12/21/23 0746   12/21/23 0745  azithromycin (ZITHROMAX) 500 mg in sodium chloride 0.9 % 250 mL IVPB  Status:  Discontinued        50 0 mg 250 mL/hr over 60 Minutes Intravenous Every 24 hours 12/21/23 0744 12/21/23 0746   12/21/23 0630  cefTRIAXone  (ROCEPHIN ) 2 g in sodium chloride  0.9 % 100 mL IVPB        2  g 200 mL/hr over 30 Minutes Intravenous Every 24 hours 12/21/23 0629 12/25/23 9386   12/21/23 0630  azithromycin  (ZITHROMAX ) 500 mg in sodium chloride  0.9 % 250 mL IVPB  Status:  Discontinued        500 mg 250 mL/hr over 60 Minutes Intravenous Every 24 hours 12/21/23 0629 12/23/23 1220              Family Communication/Anticipated D/C date and plan/Code Status   DVT prophylaxis: SCDs Start: 12/21/23 0740 apixaban  (ELIQUIS ) tablet 5 mg     Code Status: Limited: Do not attempt resuscitation (DNR) -DNR-LIMITED -Do Not Intubate/DNI   Family Communication: None Disposition Plan: Plan to discharge to SNF   Status is: Inpatient Remains inpatient appropriate because: Awaiting placement to SNF    Subjective:   Interval events noted.  Continues to have discomfort due to arthritis, otherwise no acute events.  Objective:    Vitals:   01/09/24 2005 01/10/24 0327 01/10/24 0434 01/10/24 0833  BP: 130/65 116/70  (!) 131/52  Pulse: (!) 106 90  80  Resp: 17 16  17   Temp: 97.7 F (36.5 C) 97.7 F (36.5 C)  97.8 F (36.6 C)  TempSrc: Oral Oral    SpO2: 92% 96%  96%  Weight:   71.7 kg   Height:       No data found.   Intake/Output Summary (Last 24 hours) at 01/10/2024 1507 Last data filed at 01/10/2024 1300 Gross per 24 hour  Intake 480 ml  Output 500 ml  Net -20 ml    Filed Weights   01/07/24 0500 01/08/24 0500 01/10/24 0434  Weight: 77.9 kg 77.9 kg 71.7 kg     Exam:  Constitutional: In no distress.  Sitting up in chair. Cardiovascular: Normal rate, regular rhythm. No lower extremity edema  Pulmonary: Non labored breathing on nasal cannula, no wheezing or rales.   Abdominal: Soft. Non distended and non tender Musculoskeletal: Normal range of motion.     Neurological: Alert and oriented to person, place, and time. Non focal  Skin: Skin is warm and dry.   Data Reviewed:   I have personally reviewed following labs and imaging studies:  Labs: Labs show the following:   Basic Metabolic Panel: Recent Labs  Lab 01/09/24 0537 01/10/24 0601  NA 138 139  K 4.3 4.1  CL 102 106  CO2 25 24  GLUCOSE 156* 140*  BUN 32* 32*  CREATININE 0.96 1.04*  CALCIUM  9.7 9.3   GFR Estimated Creatinine Clearance: 35 mL/min (A) (by C-G formula based on SCr of 1.04 mg/dL (H)). Liver Function Tests: No results for input(s): AST, ALT, ALKPHOS, BILITOT, PROT, ALBUMIN  in the last 168 hours. No results for input(s): LIPASE, AMYLASE in the last 168 hours. No results for input(s): AMMONIA in the last 168 hours. Coagulation profile No results for input(s): INR, PROTIME in the last 168 hours.  CBC: Recent Labs  Lab 01/07/24 0536 01/09/24 0537 01/10/24 0601  WBC 3.5* 3.5* 3.7*  NEUTROABS  --  2.0  --   HGB 10.1* 10.2* 9.8*  HCT 32.2* 32.7* 30.6*  MCV 109.5* 109.7* 110.1*  PLT 361 342 297   Cardiac Enzymes: No results for input(s): CKTOTAL, CKMB, CKMBINDEX, TROPONINI in the last 168 hours. BNP (last 3 results) Recent Labs    12/22/23 1247 12/25/23 1343 01/09/24 0537  PROBNP 418.0* 6,107.0* 699.0*   CBG: Recent Labs  Lab 01/09/24 1200 01/09/24 1639 01/09/24 2304 01/10/24 0828 01/10/24 1144  GLUCAP 225* 187* 125*  149* 180*   D-Dimer: No results for input(s): DDIMER in the last 72 hours. Hgb A1c: No results for input(s): HGBA1C in the last 72 hours. Lipid Profile: No results for input(s): CHOL,  HDL, LDLCALC, TRIG, CHOLHDL, LDLDIRECT in the last 72 hours. Thyroid function studies: No results for input(s): TSH, T4TOTAL, T3FREE, THYROIDAB in the last 72 hours.  Invalid input(s): FREET3 Anemia work up: No results for input(s): VITAMINB12, FOLATE, FERRITIN, TIBC, IRON, RETICCTPCT in the last 72 hours. Sepsis Labs: Recent Labs  Lab 01/07/24 0536 01/09/24 0537 01/10/24 0601  WBC 3.5* 3.5* 3.7*    Microbiology No results found for this or any previous visit (from the past 240 hours).  Procedures and diagnostic studies:  No results found.    LOS: 20 days   Alban Pepper MD [facility]   Pager on [email or web address]. If 7PM-7AM, please contact night-coverage at [email or web address]    01/10/2024, 3:07 PM           "

## 2024-01-10 NOTE — Plan of Care (Signed)
  Problem: Fluid Volume: Goal: Hemodynamic stability will improve Outcome: Progressing   Problem: Clinical Measurements: Goal: Signs and symptoms of infection will decrease Outcome: Progressing   Problem: Respiratory: Goal: Ability to maintain adequate ventilation will improve Outcome: Progressing   

## 2024-01-11 DIAGNOSIS — J45901 Unspecified asthma with (acute) exacerbation: Secondary | ICD-10-CM | POA: Diagnosis not present

## 2024-01-11 LAB — BASIC METABOLIC PANEL WITH GFR
Anion gap: 10 (ref 5–15)
BUN: 39 mg/dL — ABNORMAL HIGH (ref 8–23)
CO2: 24 mmol/L (ref 22–32)
Calcium: 9.4 mg/dL (ref 8.9–10.3)
Chloride: 105 mmol/L (ref 98–111)
Creatinine, Ser: 1.04 mg/dL — ABNORMAL HIGH (ref 0.44–1.00)
GFR, Estimated: 52 mL/min — ABNORMAL LOW
Glucose, Bld: 148 mg/dL — ABNORMAL HIGH (ref 70–99)
Potassium: 4.1 mmol/L (ref 3.5–5.1)
Sodium: 140 mmol/L (ref 135–145)

## 2024-01-11 LAB — GLUCOSE, CAPILLARY
Glucose-Capillary: 179 mg/dL — ABNORMAL HIGH (ref 70–99)
Glucose-Capillary: 132 mg/dL — ABNORMAL HIGH (ref 70–99)
Glucose-Capillary: 203 mg/dL — ABNORMAL HIGH (ref 70–99)
Glucose-Capillary: 179 mg/dL — ABNORMAL HIGH (ref 70–99)

## 2024-01-11 NOTE — Progress Notes (Signed)
 "    Progress Note    Jacqueline Arroyo  FMW:[id number] DOB: Aug 01, 1940  DOA: 12/21/2023 PCP: [facility]      Brief Narrative:    Medical records reviewed and are as summarized below:  Jacqueline Arroyo is a 84 y.o. female  with medical history significant for mild on surveillance, diabetes, HTN, HLD, asthma, polycythemia vera, HFrEF with last EF was around 35 to 40% from echo in 2025 July, CKD who presented to ED at [facility] for worsening shortness of breath cough and generalized weakness.  Patient had a recent hospitalization at [facility] and was discharged from the hospital on 12/16/2023 after being diagnosed with bronchitis and exacerbation of asthma.  She presented on 12/19  with worsening respiratory symptoms after she went home along with some chills, generalized weakness and worsening nausea.  Admitted for possible community-acquired pneumonia, asthma exacerbation, metapneumovirus infection.       Assessment/Plan:   Principal Problem:   CAP (community acquired pneumonia) Active Problems:   HFrEF (heart failure with reduced ejection fraction) (HCC)   HTN (hypertension)   HLD (hyperlipidemia)   DM (diabetes mellitus) (HCC)   Asthma, chronic   Polycythemia vera (HCC)   Nutrition Problem: Inadequate oral intake Etiology: acute illness  Signs/Symptoms: meal completion < 25%   Body mass index is 30.74 kg/m.   Possible CAP  Asthma exacerbation  Metapnemovirus + Chronic hypoxic respiratory failure - 2L [place] Patient recently treated at [facility] for asthma exacerbation and viral bronchitis (metapneumovirus +). She was discharged with albuterol  and steroid course Presented with worsening SOB, elevated D-dimer, VQ neg for PE. CXR with small opacity adjacent to elevated diaphragm.  Completed abx course, prednisone  x 5 days Continue bronchodilators as needed    Left popliteal vein DVT Suspect due to immobility elevated D-dimer, VQ neg for PE US  lower extremity  shows nonocclusive acute DVT in left popliteal vein Continue Eliquis .  She is allergic to heparin /Lovenox     Acute on chronic HFmrEF Resolved Appears euvolemic  Echo shows EF 40 to 45%, LV global hypokinesis.  LA moderately dilated, trivial MR. Continue torsemide  from daily dosing to every other day dosing.  Continue metoprolol . S/p treatment with IV Lasix . 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  Serum creatinine mildly elevated this a.m.  Cr peaked 1.60    Latest Ref Rng & Units 01/11/2024    5:54 AM 01/10/2024    6:01 AM 01/09/2024    5:37 AM  BMP  Glucose 70 - 99 mg/dL 851  859  843   BUN 8 - 23 mg/dL 39  32  32   Creatinine 0.44 - 1.00 mg/dL 8.95  8.95  9.03   Sodium 135 - 145 mmol/L 140  139  138   Potassium 3.5 - 5.1 mmol/L 4.1  4.1  4.3   Chloride 98 - 111 mmol/L 105  106  102   CO2 22 - 32 mmol/L 24  24  25    Calcium  8.9 - 10.3 mg/dL 9.4  9.3  9.7   -Continue to monitor with daily BMP   Hyponatremia Resolved.     T2DM with hyperglycemia Steroid-induced hyperglycemia HbA1c 7.8 Completed prednisone  BG improved.  Continue Lantus  at 12 units daily and NovoLog  6 units 3 times daily with meals. Monitor glucose levels and adjust insulin  accordingly. CBG (last 3)  Recent Labs    01/10/24 2210 01/11/24 0805 01/11/24 1157  GLUCAP 182* 179* 132*  Acute urinary retention: Resolved  Bladder scan showed 935 mL of urine.  In and out urethral catheterization produced 1 L of urine on 01/06/2024.   She has been able to urinate spontaneously (multiple times) after transient urethral catheterization for urinary retention.    Left ankle osteoarthritis Has chronic pain XR ankle - no fracture Use Tylenol  as needed.  Patient takes courses of tramadol  intermittently outpatient Continue tramadol      Left wrist pain, left shoulder pain XR Left wrist no acute fracture History right shoulder showed mild acromioclavicular degenerative change,  mild subcortical cystic change in the lateral humeral head. Analgesics as needed for pain.   H/o Polycythemia vera  Hgb around 9.5. Noted to be 12-13 during recent hospitalization. No obvious bleeding noted, denies active bleeding/melena Iron panel consistent with ACD, b12 and folate WNL  Continue hydroxyurea  Per her last hematology oncology note, patient should be on 1 tab twice daily of hydroxyurea  Monday and Tuesday, and 1 tab daily Wednesday through Sunday, to minimize cytopenias Continue reduced dose schedule     Constipation Laxatives as needed    Comorbidities include hypertension, hyperlipidemia  Awaiting placement to SNF Diet Order             Diet Carb Modified Room service appropriate? Yes; Fluid restriction: 2000 mL Fluid  Diet effective now                   Consultants: Nephrologist  Procedures: None    Medications:    apixaban  5 mg Oral BID   vitamin C  500 mg Oral BID   aspirin EC  81 mg Oral Daily   atorvastatin  10 mg Oral Daily   cycloSPORINE  1 drop Both Eyes BID   feeding supplement (GLUCERNA SHAKE)  237 mL Oral TID BM   fluticasone furoate-vilanterol  1 puff Inhalation Daily   hydroxyurea  500 mg Oral Daily   insulin aspart  0-5 Units Subcutaneous QHS   insulin aspart  0-9 Units Subcutaneous TID WC   insulin aspart  6 Units Subcutaneous TID WC   insulin glargine  14 Units Subcutaneous Daily   leptospermum manuka honey  1 Application Topical BID   lidocaine  1 patch Transdermal Q24H   LORazepam  0.25 mg Oral BID   metoprolol succinate  50 mg Oral QHS   multivitamin with minerals  1 tablet Oral Daily   pantoprazole  40 mg Oral BID AC   torsemide  10 mg Oral QODAY   zinc sulfate (50mg elemental zinc)  220 mg Oral Daily   Continuous Infusions:   Anti-infectives (From admission, onward)    Start     Dose/Rate Route Frequency Ordered Stop   12/24/23 1000  azithromycin (ZITHROMAX) tablet 500 mg        500 mg Oral Daily 12/23/23 1220  12/25/23 1001   12/21/23 0745  cefTRIAXone (ROCEPHIN) 2 g in sodium chloride 0.9 % 100 mL IVPB  Status:  Discontinued        2 g 200 mL/hr over 30 Minutes Intravenous Every 24 hours 12/21/23 0744 12/21/23 0746   12/21/23 0745  azithromycin (ZITHROMAX) 500 mg in sodium chloride 0.9 % 250 mL IVPB  Status:  Discontinued        50 0 mg 250 mL/hr over 60 Minutes Intravenous Every 24 hours 12/21/23 0744 12/21/23 0746   12/21/23 0630  cefTRIAXone  (ROCEPHIN ) 2 g in sodium chloride  0.9 % 100 mL IVPB        2  g 200 mL/hr over 30 Minutes Intravenous Every 24 hours 12/21/23 0629 12/25/23 9386   12/21/23 0630  azithromycin  (ZITHROMAX ) 500 mg in sodium chloride  0.9 % 250 mL IVPB  Status:  Discontinued        500 mg 250 mL/hr over 60 Minutes Intravenous Every 24 hours 12/21/23 0629 12/23/23 1220              Family Communication/Anticipated D/C date and plan/Code Status   DVT prophylaxis: SCDs Start: 12/21/23 0740 apixaban  (ELIQUIS ) tablet 5 mg     Code Status: Limited: Do not attempt resuscitation (DNR) -DNR-LIMITED -Do Not Intubate/DNI   Family Communication: None Disposition Plan: Plan to discharge to SNF awaiting ins approval.    Status is: Inpatient Remains inpatient appropriate because: Awaiting placement to SNF    Subjective:   Interval events noted.  No acute events overnight.   Objective:    Vitals:   01/10/24 1922 01/11/24 0347 01/11/24 0500 01/11/24 0803  BP: 106/79 (!) 107/58  113/60  Pulse: (!) 104 88  84  Resp: 16 16  16   Temp: 98 F (36.7 C) 98 F (36.7 C)  98.5 F (36.9 C)  TempSrc: Oral Oral    SpO2: 96% 93%  94%  Weight:   71.4 kg   Height:       No data found.  No intake or output data in the 24 hours ending 01/11/24 1405   Filed Weights   01/08/24 0500 01/10/24 0434 01/11/24 0500  Weight: 77.9 kg 71.7 kg 71.4 kg    Exam:  Physical Exam  Constitutional: In no distress. Chronically ill appearing.  Cardiovascular: Normal rate, regular  rhythm. No lower extremity edema  Pulmonary: Non labored breathing on [place], no wheezing or rales.   Abdominal: Soft. Non distended and non tender Musculoskeletal: Normal range of motion.     Neurological: Alert and oriented to person, place, and time. Non focal  Skin: Skin is warm and dry.    Data Reviewed:   I have personally reviewed following labs and imaging studies:  Labs: Labs show the following:   Basic Metabolic Panel: Recent Labs  Lab 01/09/24 0537 01/10/24 0601 01/11/24 0554  NA 138 139 140  K 4.3 4.1 4.1  CL 102 106 105  CO2 25 24 24   GLUCOSE 156* 140* 148*  BUN 32* 32* 39*  CREATININE 0.96 1.04* 1.04*  CALCIUM  9.7 9.3 9.4   GFR Estimated Creatinine Clearance: 34.9 mL/min (A) (by C-G formula based on SCr of 1.04 mg/dL (H)). Liver Function Tests: No results for input(s): AST, ALT, ALKPHOS, BILITOT, PROT, ALBUMIN  in the last 168 hours. No results for input(s): LIPASE, AMYLASE in the last 168 hours. No results for input(s): AMMONIA in the last 168 hours. Coagulation profile No results for input(s): INR, PROTIME in the last 168 hours.  CBC: Recent Labs  Lab 01/07/24 0536 01/09/24 0537 01/10/24 0601  WBC 3.5* 3.5* 3.7*  NEUTROABS  --  2.0  --   HGB 10.1* 10.2* 9.8*  HCT 32.2* 32.7* 30.6*  MCV 109.5* 109.7* 110.1*  PLT 361 342 297   Cardiac Enzymes: No results for input(s): CKTOTAL, CKMB, CKMBINDEX, TROPONINI in the last 168 hours. BNP (last 3 results) Recent Labs    12/22/23 1247 12/25/23 1343 01/09/24 0537  PROBNP 418.0* 6,107.0* 699.0*   CBG: Recent Labs  Lab 01/10/24 1144 01/10/24 1617 01/10/24 2210 01/11/24 0805 01/11/24 1157  GLUCAP 180* 166* 182* 179* 132*   D-Dimer: No results for input(s): DDIMER  in the last 72 hours. Hgb A1c: No results for input(s): HGBA1C in the last 72 hours. Lipid Profile: No results for input(s): CHOL, HDL, LDLCALC, TRIG, CHOLHDL, LDLDIRECT in the last 72  hours. Thyroid function studies: No results for input(s): TSH, T4TOTAL, T3FREE, THYROIDAB in the last 72 hours.  Invalid input(s): FREET3 Anemia work up: No results for input(s): VITAMINB12, FOLATE, FERRITIN, TIBC, IRON, RETICCTPCT in the last 72 hours. Sepsis Labs: Recent Labs  Lab 01/07/24 0536 01/09/24 0537 01/10/24 0601  WBC 3.5* 3.5* 3.7*    Microbiology No results found for this or any previous visit (from the past 240 hours).  Procedures and diagnostic studies:  No results found.    LOS: 21 days   Alban Pepper MD [facility]   Pager on [email or web address]. If 7PM-7AM, please contact night-coverage at [email or web address]    01/11/2024, 2:05 PM           "

## 2024-01-11 NOTE — Plan of Care (Signed)
" °  Problem: Fluid Volume: Goal: Hemodynamic stability will improve Outcome: Progressing   Problem: Respiratory: Goal: Ability to maintain adequate ventilation will improve Outcome: Progressing   Problem: Education: Goal: Ability to describe self-care measures that may prevent or decrease complications (Diabetes Survival Skills Education) will improve Outcome: Progressing   Problem: Coping: Goal: Ability to adjust to condition or change in health will improve Outcome: Progressing   Problem: Nutritional: Goal: Maintenance of adequate nutrition will improve Outcome: Progressing   Problem: Skin Integrity: Goal: Risk for impaired skin integrity will decrease Outcome: Progressing   Problem: Coping: Goal: Level of anxiety will decrease Outcome: Progressing   Problem: Elimination: Goal: Will not experience complications related to bowel motility Outcome: Progressing   Problem: Pain Managment: Goal: General experience of comfort will improve and/or be controlled Outcome: Progressing   Problem: Safety: Goal: Ability to remain free from injury will improve Outcome: Progressing   "

## 2024-01-11 NOTE — Plan of Care (Signed)

## 2024-01-12 DIAGNOSIS — J189 Pneumonia, unspecified organism: Secondary | ICD-10-CM | POA: Diagnosis not present

## 2024-01-12 LAB — GLUCOSE, CAPILLARY
Glucose-Capillary: 150 mg/dL — ABNORMAL HIGH (ref 70–99)
Glucose-Capillary: 140 mg/dL — ABNORMAL HIGH (ref 70–99)
Glucose-Capillary: 157 mg/dL — ABNORMAL HIGH (ref 70–99)
Glucose-Capillary: 157 mg/dL — ABNORMAL HIGH (ref 70–99)
Glucose-Capillary: 189 mg/dL — ABNORMAL HIGH (ref 70–99)

## 2024-01-12 LAB — BASIC METABOLIC PANEL WITH GFR
Anion gap: 9 (ref 5–15)
BUN: 38 mg/dL — ABNORMAL HIGH (ref 8–23)
CO2: 25 mmol/L (ref 22–32)
Calcium: 9.2 mg/dL (ref 8.9–10.3)
Chloride: 106 mmol/L (ref 98–111)
Creatinine, Ser: 0.9 mg/dL (ref 0.44–1.00)
GFR, Estimated: >60 mL/min
Glucose, Bld: 148 mg/dL — ABNORMAL HIGH (ref 70–99)
Potassium: 4.3 mmol/L (ref 3.5–5.1)
Sodium: 140 mmol/L (ref 135–145)

## 2024-01-12 LAB — MAGNESIUM: Magnesium: 1.8 mg/dL (ref 1.7–2.4)

## 2024-01-12 NOTE — Progress Notes (Signed)
 "    Progress Note    Jacqueline Arroyo  FMW:[id number] DOB: 1940/11/23  DOA: 12/21/2023 PCP: [facility]      Brief Narrative:    Medical records reviewed and are as summarized below:  Jacqueline Arroyo is a 84 y.o. female  with medical history significant for mild on surveillance, diabetes, HTN, HLD, asthma, polycythemia vera, HFrEF with last EF was around 35 to 40% from echo in 2025 July, CKD who presented to ED at [facility] for worsening shortness of breath cough and generalized weakness.  Patient had a recent hospitalization at [facility] and was discharged from the hospital on 12/16/2023 after being diagnosed with bronchitis and exacerbation of asthma.  She presented on 12/19  with worsening respiratory symptoms after she went home along with some chills, generalized weakness and worsening nausea.  Admitted for possible community-acquired pneumonia, asthma exacerbation, metapneumovirus infection.       Assessment/Plan:   Principal Problem:   CAP (community acquired pneumonia) Active Problems:   HFrEF (heart failure with reduced ejection fraction) (HCC)   HTN (hypertension)   HLD (hyperlipidemia)   DM (diabetes mellitus) (HCC)   Asthma, chronic   Polycythemia vera (HCC)   Nutrition Problem: Inadequate oral intake Etiology: acute illness  Signs/Symptoms: meal completion < 25%   Body mass index is 33.02 kg/m.   Possible CAP  Asthma exacerbation  Metapnemovirus + Chronic hypoxic respiratory failure - 2L [place] Patient recently treated at [facility] for asthma exacerbation and viral bronchitis (metapneumovirus +). She was discharged with albuterol  and steroid course Presented with worsening SOB, elevated D-dimer, VQ neg for PE. CXR with small opacity adjacent to elevated diaphragm.  Completed abx course, prednisone  x 5 days Continue bronchodilators as needed    Left popliteal vein DVT Suspect due to immobility elevated D-dimer, VQ neg for PE US  lower extremity  shows nonocclusive acute DVT in left popliteal vein Continue Eliquis .  She is allergic to heparin /Lovenox     Acute on chronic HFmrEF Resolved Appears euvolemic  Echo shows EF 40 to 45%, LV global hypokinesis.  LA moderately dilated, trivial MR. Continue torsemide  from daily dosing to every other day dosing.  Continue metoprolol . S/p treatment with IV Lasix . 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  Serum creatinine mildly elevated this a.m.  Cr peaked 1.60    Latest Ref Rng & Units 01/12/2024    7:58 AM 01/11/2024    5:54 AM 01/10/2024    6:01 AM  BMP  Glucose 70 - 99 mg/dL 851  851  859   BUN 8 - 23 mg/dL 38  39  32   Creatinine 0.44 - 1.00 mg/dL 9.09  8.95  8.95   Sodium 135 - 145 mmol/L 140  140  139   Potassium 3.5 - 5.1 mmol/L 4.3  4.1  4.1   Chloride 98 - 111 mmol/L 106  105  106   CO2 22 - 32 mmol/L 25  24  24    Calcium  8.9 - 10.3 mg/dL 9.2  9.4  9.3   -Continue to monitor with daily BMP   Hyponatremia Resolved.     T2DM with hyperglycemia Steroid-induced hyperglycemia HbA1c 7.8 Completed prednisone  BG improved.  Continue Lantus  at 12 units daily and NovoLog  6 units 3 times daily with meals. Monitor glucose levels and adjust insulin  accordingly. CBG (last 3)  Recent Labs    01/11/24 2116 01/12/24 0828 01/12/24 1157  GLUCAP 179* 150* 140*  Acute urinary retention: Resolved  Bladder scan showed 935 mL of urine.  In and out urethral catheterization produced 1 L of urine on 01/06/2024.   She has been able to urinate spontaneously (multiple times) after transient urethral catheterization for urinary retention.    Left ankle osteoarthritis Has chronic pain XR ankle - no fracture Use Tylenol  as needed.  Patient takes courses of tramadol  intermittently outpatient Continue tramadol      Left wrist pain, left shoulder pain XR Left wrist no acute fracture History right shoulder showed mild acromioclavicular degenerative change,  mild subcortical cystic change in the lateral humeral head. Analgesics as needed for pain.   H/o Polycythemia vera  Hgb around 9.5. Noted to be 12-13 during recent hospitalization. No obvious bleeding noted, denies active bleeding/melena Iron panel consistent with ACD, b12 and folate WNL  Continue hydroxyurea  Per her last hematology oncology note, patient should be on 1 tab twice daily of hydroxyurea  Monday and Tuesday, and 1 tab daily Wednesday through Sunday, to minimize cytopenias Continue reduced dose schedule     Constipation Laxatives as needed    Comorbidities include hypertension, hyperlipidemia  Awaiting placement to SNF Diet Order             Diet Carb Modified Room service appropriate? Yes; Fluid restriction: 2000 mL Fluid  Diet effective now                   Consultants: Nephrologist  Procedures: None    Medications:    apixaban  5 mg Oral BID   vitamin C  500 mg Oral BID   aspirin EC  81 mg Oral Daily   atorvastatin  10 mg Oral Daily   cycloSPORINE  1 drop Both Eyes BID   feeding supplement (GLUCERNA SHAKE)  237 mL Oral TID BM   fluticasone furoate-vilanterol  1 puff Inhalation Daily   insulin aspart  0-5 Units Subcutaneous QHS   insulin aspart  0-9 Units Subcutaneous TID WC   insulin aspart  6 Units Subcutaneous TID WC   insulin glargine  14 Units Subcutaneous Daily   leptospermum manuka honey  1 Application Topical BID   lidocaine  1 patch Transdermal Q24H   LORazepam  0.25 mg Oral BID   metoprolol succinate  50 mg Oral QHS   multivitamin with minerals  1 tablet Oral Daily   pantoprazole  40 mg Oral BID AC   torsemide  10 mg Oral QODAY   zinc sulfate (50mg elemental zinc)  220 mg Oral Daily   Continuous Infusions:   Anti-infectives (From admission, onward)    Start     Dose/Rate Route Frequency Ordered Stop   12/24/23 1000  azithromycin (ZITHROMAX) tablet 500 mg        500 mg Oral Daily 12/23/23 1220 12/25/23 1001   12/21/23 0745   cefTRIAXone (ROCEPHIN) 2 g in sodium chloride 0.9 % 100 mL IVPB  Status:  Discontinued        2 g 200 mL/hr over 30 Minutes Intravenous Every 24 hours 12/21/23 0744 12/21/23 0746   12/21/23 0745  azithromycin (ZITHROMAX) 500 mg in sodium chloride 0.9 % 250 mL IVPB  Status:  Discontinued        50 0 mg 250 mL/hr over 60 Minutes Intravenous Every 24 hours 12/21/23 0744 12/21/23 0746   12/21/23 0630  cefTRIAXone  (ROCEPHIN ) 2 g in sodium chloride  0.9 % 100 mL IVPB        2 g 200 mL/hr over 30 Minutes Intravenous Every  24 hours 12/21/23 0629 12/25/23 9386   12/21/23 0630  azithromycin  (ZITHROMAX ) 500 mg in sodium chloride  0.9 % 250 mL IVPB  Status:  Discontinued        500 mg 250 mL/hr over 60 Minutes Intravenous Every 24 hours 12/21/23 0629 12/23/23 1220              Family Communication/Anticipated D/C date and plan/Code Status   DVT prophylaxis: SCDs Start: 12/21/23 0740 apixaban  (ELIQUIS ) tablet 5 mg     Code Status: Limited: Do not attempt resuscitation (DNR) -DNR-LIMITED -Do Not Intubate/DNI   Family Communication: None Disposition Plan: Plan to discharge to SNF awaiting ins approval.    Status is: Inpatient Remains inpatient appropriate because: Awaiting placement to SNF    Subjective:   Interval events noted.  No acute events overnight.  Objective:    Vitals:   01/11/24 2036 01/12/24 0440 01/12/24 0500 01/12/24 0828  BP: (!) 117/59 134/67  (!) 122/58  Pulse: (!) 105 90  89  Resp: 19 18  16   Temp: (!) 97.5 F (36.4 C) 97.9 F (36.6 C)  98.5 F (36.9 C)  TempSrc: Oral Oral  Oral  SpO2: 95% 93%  93%  Weight:   76.7 kg   Height:       No data found.  No intake or output data in the 24 hours ending 01/12/24 1512   Filed Weights   01/10/24 0434 01/11/24 0500 01/12/24 0500  Weight: 71.7 kg 71.4 kg 76.7 kg    Exam:  Physical Exam  Constitutional: In no distress.  Cardiovascular: Normal rate, regular rhythm. No lower extremity edema  Pulmonary: Non  labored breathing on Bee, no wheezing or rales.   Abdominal: Soft. Non distended and non tender Musculoskeletal: Normal range of motion.     Neurological: Alert and oriented to person, place, and time. Non focal  Skin: Skin is warm and dry.     Data Reviewed:   I have personally reviewed following labs and imaging studies:  Labs: Labs show the following:   Basic Metabolic Panel: Recent Labs  Lab 01/09/24 0537 01/10/24 0601 01/11/24 0554 01/12/24 0758  NA 138 139 140 140  K 4.3 4.1 4.1 4.3  CL 102 106 105 106  CO2 25 24 24 25   GLUCOSE 156* 140* 148* 148*  BUN 32* 32* 39* 38*  CREATININE 0.96 1.04* 1.04* 0.90  CALCIUM  9.7 9.3 9.4 9.2  MG  --   --   --  1.8   GFR Estimated Creatinine Clearance: 41.8 mL/min (by C-G formula based on SCr of 0.9 mg/dL). Liver Function Tests: No results for input(s): AST, ALT, ALKPHOS, BILITOT, PROT, ALBUMIN  in the last 168 hours. No results for input(s): LIPASE, AMYLASE in the last 168 hours. No results for input(s): AMMONIA in the last 168 hours. Coagulation profile No results for input(s): INR, PROTIME in the last 168 hours.  CBC: Recent Labs  Lab 01/07/24 0536 01/09/24 0537 01/10/24 0601  WBC 3.5* 3.5* 3.7*  NEUTROABS  --  2.0  --   HGB 10.1* 10.2* 9.8*  HCT 32.2* 32.7* 30.6*  MCV 109.5* 109.7* 110.1*  PLT 361 342 297   Cardiac Enzymes: No results for input(s): CKTOTAL, CKMB, CKMBINDEX, TROPONINI in the last 168 hours. BNP (last 3 results) Recent Labs    12/22/23 1247 12/25/23 1343 01/09/24 0537  PROBNP 418.0* 6,107.0* 699.0*   CBG: Recent Labs  Lab 01/11/24 1157 01/11/24 1623 01/11/24 2116 01/12/24 0828 01/12/24 1157  GLUCAP 132* 203*  179* 150* 140*   D-Dimer: No results for input(s): DDIMER in the last 72 hours. Hgb A1c: No results for input(s): HGBA1C in the last 72 hours. Lipid Profile: No results for input(s): CHOL, HDL, LDLCALC, TRIG, CHOLHDL, LDLDIRECT in  the last 72 hours. Thyroid function studies: No results for input(s): TSH, T4TOTAL, T3FREE, THYROIDAB in the last 72 hours.  Invalid input(s): FREET3 Anemia work up: No results for input(s): VITAMINB12, FOLATE, FERRITIN, TIBC, IRON, RETICCTPCT in the last 72 hours. Sepsis Labs: Recent Labs  Lab 01/07/24 0536 01/09/24 0537 01/10/24 0601  WBC 3.5* 3.5* 3.7*    Microbiology No results found for this or any previous visit (from the past 240 hours).  Procedures and diagnostic studies:  No results found.    LOS: 22 days   Alban Pepper MD [facility]   Pager on [email or web address]. If 7PM-7AM, please contact night-coverage at [email or web address]    01/12/2024, 3:12 PM           "

## 2024-01-12 NOTE — Progress Notes (Signed)
 Patient alert and oriented x 4, refused pain medication throughout shift.  Patient complaints of 10/10 generalized pain at 1730.  Patient educated pain medication administrated with patient request, with poor understanding.  Patient verbalized poor understanding, refuses dressing change.

## 2024-01-13 DIAGNOSIS — J189 Pneumonia, unspecified organism: Secondary | ICD-10-CM | POA: Diagnosis not present

## 2024-01-13 LAB — BASIC METABOLIC PANEL WITH GFR
Anion gap: 12 (ref 5–15)
Anion gap: 11 (ref 5–15)
BUN: 38 mg/dL — ABNORMAL HIGH (ref 8–23)
BUN: 41 mg/dL — ABNORMAL HIGH (ref 8–23)
CO2: 23 mmol/L (ref 22–32)
CO2: 24 mmol/L (ref 22–32)
Calcium: 9.7 mg/dL (ref 8.9–10.3)
Calcium: 9.8 mg/dL (ref 8.9–10.3)
Chloride: 106 mmol/L (ref 98–111)
Chloride: 106 mmol/L (ref 98–111)
Creatinine, Ser: 0.88 mg/dL (ref 0.44–1.00)
Creatinine, Ser: 1.04 mg/dL — ABNORMAL HIGH (ref 0.44–1.00)
GFR, Estimated: >60 mL/min
GFR, Estimated: 52 mL/min — ABNORMAL LOW
Glucose, Bld: 147 mg/dL — ABNORMAL HIGH (ref 70–99)
Glucose, Bld: 140 mg/dL — ABNORMAL HIGH (ref 70–99)
Potassium: 4.2 mmol/L (ref 3.5–5.1)
Potassium: 4.7 mmol/L (ref 3.5–5.1)
Sodium: 141 mmol/L (ref 135–145)
Sodium: 141 mmol/L (ref 135–145)

## 2024-01-13 LAB — GLUCOSE, CAPILLARY
Glucose-Capillary: 179 mg/dL — ABNORMAL HIGH (ref 70–99)
Glucose-Capillary: 110 mg/dL — ABNORMAL HIGH (ref 70–99)
Glucose-Capillary: 132 mg/dL — ABNORMAL HIGH (ref 70–99)
Glucose-Capillary: 188 mg/dL — ABNORMAL HIGH (ref 70–99)

## 2024-01-13 LAB — MAGNESIUM: Magnesium: 1.9 mg/dL (ref 1.7–2.4)

## 2024-01-13 MED ORDER — IPRATROPIUM-ALBUTEROL 0.5-2.5 (3) MG/3ML IN SOLN: 3.0000 mL | Freq: Four times a day (QID) | RESPIRATORY_TRACT | Status: AC | PRN

## 2024-01-13 MED ORDER — LOSARTAN POTASSIUM 50 MG PO TABS: 50.0000 mg | ORAL_TABLET | Freq: Every day | ORAL | Status: AC

## 2024-01-13 MED ORDER — ZINC SULFATE 220 (50 ZN) MG PO CAPS: 220.0000 mg | ORAL_CAPSULE | Freq: Every day | ORAL | Status: AC

## 2024-01-13 MED ORDER — HYDROXYUREA 500 MG PO CAPS
500.0000 mg | ORAL_CAPSULE | Freq: Two times a day (BID) | ORAL | Status: AC
  Administered 2024-01-13 – 2024-01-14 (×4): 500 mg via ORAL
  Filled 2024-01-13 (×4): qty 1

## 2024-01-13 MED ORDER — TRAMADOL HCL 50 MG PO TABS: 50.0000 mg | ORAL_TABLET | Freq: Two times a day (BID) | ORAL | 0 refills | Status: AC | PRN

## 2024-01-13 MED ORDER — INSULIN GLARGINE 100 UNIT/ML ~~LOC~~ SOLN: 14.0000 [IU] | Freq: Every day | SUBCUTANEOUS | Status: AC

## 2024-01-13 MED ORDER — ACETAMINOPHEN 650 MG RE SUPP: 650.0000 mg | Freq: Four times a day (QID) | RECTAL | Status: DC

## 2024-01-13 MED ORDER — ADULT MULTIVITAMIN W/MINERALS CH: 1.0000 | ORAL_TABLET | Freq: Every day | ORAL | Status: AC

## 2024-01-13 MED ORDER — ASCORBIC ACID 500 MG PO TABS: 500.0000 mg | ORAL_TABLET | Freq: Two times a day (BID) | ORAL | Status: AC

## 2024-01-13 MED ORDER — HYDROXYUREA 500 MG PO CAPS: ORAL_CAPSULE | ORAL | Status: AC

## 2024-01-13 MED ORDER — APIXABAN 5 MG PO TABS: 5.0000 mg | ORAL_TABLET | Freq: Two times a day (BID) | ORAL | Status: AC

## 2024-01-13 MED ORDER — POLYETHYLENE GLYCOL 3350 17 G PO PACK: 17.0000 g | PACK | Freq: Every day | ORAL | Status: AC | PRN

## 2024-01-13 MED ORDER — FREESTYLE LANCETS MISC: Status: AC

## 2024-01-13 MED ORDER — ACETAMINOPHEN 325 MG PO TABS
650.0000 mg | ORAL_TABLET | Freq: Four times a day (QID) | ORAL | Status: DC
  Administered 2024-01-13 – 2024-01-15 (×7): 650 mg via ORAL
  Filled 2024-01-13 (×7): qty 2

## 2024-01-13 MED ORDER — GLUCERNA SHAKE PO LIQD: 237.0000 mL | Freq: Three times a day (TID) | ORAL | Status: AC

## 2024-01-13 NOTE — Discharge Summary (Incomplete)
 " Physician Discharge Summary   Patient: Jacqueline Arroyo MRN: [id number] DOB: October 07, 1940  Admit date:     12/21/2023  Discharge date: 01/15/2024  Discharge Physician: Elvan Sor   PCP: [facility]   Recommendations at discharge:    Will need BMP in 1 week to ensure serum creatinine is stable  Antihypertensive regimen will need to be adjusted.  Discuss whether aspirin  should be continued while being treated for DVT  Will need antidiabetes medications adjusted, she is new to insulin  and will need teaching regarding this.  Needs left hand splint as well for possible carpal tunnel syndrome.   Discharge Diagnoses: Principal Problem:   CAP (community acquired pneumonia) Active Problems:   HFrEF (heart failure with reduced ejection fraction) (HCC)   HTN (hypertension)   HLD (hyperlipidemia)   DM (diabetes mellitus) (HCC)   Asthma, chronic   Polycythemia vera (HCC)  Resolved Problems:   * No resolved hospital problems. *  1/14 seen and examined today, stable to dc to SNF  No changed made in the below dc summary done by my colleague yesterday.     Hospital Course: Jacqueline Arroyo is a 84 y.o. female  with medical history significant for mild on surveillance, diabetes, HTN, HLD, asthma, polycythemia vera, HFrEF with last EF was around 35 to 40% from echo in 2025 July, CKD who presented to ED at [facility] for worsening shortness of breath cough and generalized weakness.  Patient had a recent hospitalization at [facility] and was discharged from the hospital on 12/16/2023 after being diagnosed with bronchitis and exacerbation of asthma.  She presented on 12/19  with worsening respiratory symptoms after she went home along with some chills, generalized weakness and worsening nausea.  Admitted for possible community-acquired pneumonia, asthma exacerbation, metapneumovirus infection.      Assessment and Plan: Possible CAP  Asthma exacerbation  Metapnemovirus + Chronic hypoxic respiratory  failure - 2L  Patient recently treated at [facility] for asthma exacerbation and viral bronchitis (metapneumovirus +). She was discharged with albuterol  and steroid course Presented with worsening SOB, elevated D-dimer, VQ neg for PE. CXR with small opacity adjacent to elevated diaphragm.  Completed abx course, prednisone  x 5 days Continue bronchodilators as needed     Left popliteal vein DVT Suspect due to immobility elevated D-dimer, VQ neg for PE US  lower extremity shows nonocclusive acute DVT in left popliteal vein Continue Eliquis .  She is allergic to heparin /Lovenox      Acute on chronic HFmrEF Resolved Appears euvolemic  Echo shows EF 40 to 45%, LV global hypokinesis.  LA moderately dilated, trivial MR. Continue torsemide  from daily dosing to every other day dosing.  Continue metoprolol . S/p treatment with IV Lasix . 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  Serum creatinine mildly elevated this a.m.  Cr peaked 1.60   Hyponatremia Resolved.      T2DM with hyperglycemia Steroid-induced hyperglycemia HbA1c 7.8 Completed prednisone  BG improved.  She will continue lantus  14 u on discharge and will continue her home antidiabetes medication. This will be further titrated out of the hospital.  CBG (last 3)  Recent Labs    01/14/24 1212 01/14/24 1731 01/14/24 2122  GLUCAP 177* 134* 145*        Acute urinary retention: Resolved  Bladder scan showed 935 mL of urine.  In and out urethral catheterization produced 1 L of urine on 01/06/2024.   She has been able to urinate spontaneously (multiple times)  after transient urethral catheterization for urinary retention.       Left ankle osteoarthritis Has chronic pain XR ankle - no fracture Use Tylenol  as needed.  Patient takes courses of tramadol  intermittently outpatient Continue tramadol       Left wrist pain, left shoulder pain XR Left wrist no acute fracture History right shoulder  showed mild acromioclavicular degenerative change, mild subcortical cystic change in the lateral humeral head. L wrist splint at night.  Analgesics as needed for pain.     H/o Polycythemia vera  Hgb around 9.5. Noted to be 12-13 during recent hospitalization. No obvious bleeding noted, denies active bleeding/melena Iron panel consistent with ACD, b12 and folate WNL  Continue hydroxyurea  Per her last hematology oncology note, patient should be on 1 tab twice daily of hydroxyurea  Monday and Tuesday, and 1 tab daily Wednesday through Sunday, to minimize cytopenias Continue reduced dose schedule      Constipation Laxatives as needed     Comorbidities include hypertension, hyperlipidemia      Consultants: Nephrology,  Procedures performed: None  Disposition: Skilled nursing facility Diet recommendation:  Carb modified diet DISCHARGE MEDICATION: Allergies as of 01/15/2024       Reactions   Amoxicillin  Other (See Comments)   Other Reaction(s): Unknown   Doxycycline  Other (See Comments)   Makes her nervous and shaky.   Heparin     Broke out, whelps   Cephalexin Palpitations   Ciprofloxacin Palpitations   Legacy System: Delight    Onset Date: <blank>    Substance Legacy/Cerner: Cipro / Cipro (Legacy value)    Category: Drug    Severity Legacy/Cerner: <blank> / Unknown    Reaction(s): makes head feel funny  nausea    Comments: <blank>   Legacy System: Frye  Onset Date: <blank>  Substance Legacy/Cerner: Cipro / Cipro (Legacy value)  Category: Drug  Severity Legacy/Cerner: <blank> / Unknown  Reaction(s): makes head feel funny  nausea  Comments: <blank>   Hydrocodone-acetaminophen  Nausea And Vomiting, Nausea Only, Other (See Comments)   Sulfamethoxazole Palpitations        Medication List     PAUSE taking these medications    aspirin  EC 81 MG tablet Wait to take this until your doctor or other care provider tells you to start again. Take 81 mg by mouth  daily.       STOP taking these medications    predniSONE  20 MG tablet Commonly known as: DELTASONE        TAKE these medications    albuterol  108 (90 Base) MCG/ACT inhaler Commonly known as: VENTOLIN  HFA Inhale 1-2 puffs into the lungs every 6 (six) hours as needed for wheezing or shortness of breath.   apixaban  5 MG Tabs tablet Commonly known as: ELIQUIS  Take 1 tablet (5 mg total) by mouth 2 (two) times daily.   ascorbic acid  500 MG tablet Commonly known as: VITAMIN C  Take 1 tablet (500 mg total) by mouth 2 (two) times daily.   atorvastatin  10 MG tablet Commonly known as: LIPITOR Take 1 tablet by mouth daily.   benzonatate  200 MG capsule Commonly known as: TESSALON  Take 1 capsule (200 mg total) by mouth 3 (three) times daily as needed for cough.   calcium -vitamin D 500-200 MG-UNIT Tabs tablet Commonly known as: OSCAL WITH D Take by mouth.   feeding supplement (GLUCERNA SHAKE) Liqd Take 237 mLs by mouth 3 (three) times daily between meals.   fluticasone  50 MCG/ACT nasal spray Commonly known as: FLONASE  1 spray into each nostril daily.  freestyle lancets Check blood sugar three times a day before breakfast, lunch, and dinner What changed: See the new instructions.   furosemide  20 MG tablet Commonly known as: LASIX  Take 20 mg by mouth 3 (three) times a week.   hydroxyurea  500 MG capsule Commonly known as: HYDREA  Please take 500mg  twice a day, Monday and Tuesday. Wednesday, Thursday, Friday, Saturday, Sunday take 500mg  once a day. What changed:  how much to take how to take this when to take this additional instructions   insulin  glargine 100 UNIT/ML injection Commonly known as: LANTUS  Inject 0.14 mLs (14 Units total) into the skin daily.   ipratropium-albuterol  0.5-2.5 (3) MG/3ML Soln Commonly known as: DUONEB Take 3 mLs by nebulization every 6 (six) hours as needed.   Janumet XR 50-500 MG Tb24 Generic drug: SitaGLIPtin-MetFORMIN HCl Take 2  tablets by mouth daily.   LORazepam  0.5 MG tablet Commonly known as: ATIVAN  Take 0.5 mg by mouth at bedtime.   losartan  50 MG tablet Commonly known as: COZAAR  Take 1 tablet (50 mg total) by mouth daily. What changed: when to take this   metoprolol  succinate 50 MG 24 hr tablet Commonly known as: TOPROL -XL Take 50 mg by mouth at bedtime.   multivitamin with minerals Tabs tablet Take 1 tablet by mouth daily.   polyethylene glycol 17 g packet Commonly known as: MIRALAX  / GLYCOLAX  Take 17 g by mouth daily as needed for mild constipation.   Restasis  0.05 % ophthalmic emulsion Generic drug: cycloSPORINE  INSTILL 1 DROP INTO EACH EYE TWICE DAILY   traMADol  50 MG tablet Commonly known as: ULTRAM  Take 1 tablet (50 mg total) by mouth every 12 (twelve) hours as needed for up to 5 days for severe pain (pain score 7-10).   zinc  sulfate (50mg  elemental zinc ) 220 (50 Zn) MG capsule Take 1 capsule (220 mg total) by mouth daily.               Discharge Care Instructions  (From admission, onward)           Start     Ordered   01/14/24 0000  Discharge wound care:       Comments: R buttock: Cleanse with saline, pat dry. Apply medihoney, foam dressing on top. The foam can remain 3 days if not saturated or soiled. Ok to lift and reapply.   01/14/24 1046            Contact information for follow-up providers     [facility] Follow up.   Why: hospital follow up Contact information: [address] [place] [address] [phone]              Contact information for after-discharge care     Destination     [facility] .   Service: Skilled Nursing Contact information: [address] [place]  [address] [phone]                    Discharge Exam: Filed Weights   01/12/24 0500 01/14/24 0343 01/15/24 0500  Weight: 76.7 kg 79.3 kg 78 kg   Physical Exam  Constitutional: In no distress.  Chronically ill appearing Cardiovascular: Normal rate, regular rhythm. No lower extremity edema  Pulmonary: Non labored breathing on [place], no wheezing or rales.   Abdominal: Soft. Non distended and non tender Musculoskeletal: Normal range of motion.     Neurological: Alert and oriented to person, place, and time. Non focal  Skin: Skin is  warm and dry.    Condition at discharge: fair  The results of significant diagnostics from this hospitalization (including imaging, microbiology, ancillary and laboratory) are listed below for reference.   Imaging Studies: DG Shoulder Right Result Date: 12/31/2023 CLINICAL DATA:  Pain aggravated by activities of daily living. EXAM: DG SHOULDER 2+V*R* COMPARISON:  None Available. FINDINGS: There is no evidence of fracture or dislocation. Mild acromioclavicular degenerative change with spurring. The glenohumeral joint space is normal on provided views. Mild subcortical cystic change in the lateral humeral head. No erosive or bony destructive change. Soft tissues are unremarkable. IMPRESSION: 1. Mild acromioclavicular degenerative change. 2. Mild subcortical cystic change in the lateral humeral head, can be seen with rotator cuff arthropathy. Electronically Signed   By: Andrea Gasman M.D.   On: 12/31/2023 23:26   DG Chest Port 1 View Result Date: 12/31/2023 CLINICAL DATA:  Shortness of breath.  Weakness. EXAM: PORTABLE CHEST 1 VIEW COMPARISON:  12/27/2023 FINDINGS: Lower lung volumes from prior exam. Progression in ill-defined bibasilar opacities. There may be small pleural effusions. Stable heart size and mediastinal contours. No pulmonary edema. No pneumothorax. IMPRESSION: Progression in ill-defined bibasilar opacities, atelectasis versus pneumonia. Possible small pleural effusions. Electronically Signed   By: Andrea Gasman M.D.   On: 12/31/2023 15:58   ECHOCARDIOGRAM COMPLETE Result Date: 12/29/2023    ECHOCARDIOGRAM REPORT   Patient Name:   MARLISSA EMERICK Date of Exam: 12/29/2023 Medical Rec #:  [id number]     Height:       60.0 in Accession #:    [id number]    Weight:       154.8 lb Date of Birth:  09/05/1940      BSA:          1.674 m Patient Age:    83 years      BP:           128/64 mmHg Patient Gender: F             HR:           93 bpm. Exam Location:  [facility] Procedure: 2D Echo, Color Doppler, Cardiac Doppler and Intracardiac            Opacification Agent (Both Spectral and Color Flow Doppler were            utilized during procedure). Indications:     Ventricular Tachycardia  History:         Patient has no prior history of Echocardiogram examinations.                  Risk Factors:Hypertension and Diabetes.  Sonographer:     Logan Shove RDCS Referring Phys:  [id number] [facility] PONNALA Diagnosing Phys: Cara JONETTA Lovelace MD  Sonographer Comments: Suboptimal apical window. IMPRESSIONS  1. Left ventricular ejection fraction, by estimation, is 40 to 45%. The left ventricle has mildly decreased function. The left ventricle demonstrates global hypokinesis. Left ventricular diastolic function could not be evaluated.  2. Right ventricular systolic function is normal. The right ventricular size is normal.  3. Left atrial size was moderately dilated.  4. Right atrial size was mildly dilated.  5. The mitral valve is normal in structure. Trivial mitral valve regurgitation.  6. The aortic valve is normal in structure. Aortic valve regurgitation is not visualized. Aortic valve sclerosis is present, with no evidence of aortic valve stenosis. FINDINGS  Left Ventricle: Left ventricular ejection fraction, by estimation, is 40 to 45%. The left ventricle has mildly decreased  function. The left ventricle demonstrates global hypokinesis. Definity  contrast agent was given IV to delineate the left ventricular  endocardial borders. Strain was performed and the global longitudinal strain is indeterminate. The left ventricular internal cavity size was normal in size. There is no left  ventricular hypertrophy. Abnormal (paradoxical) septal motion, consistent with left bundle branch block. Left ventricular diastolic function could not be evaluated. Right Ventricle: The right ventricular size is normal. No increase in right ventricular wall thickness. Right ventricular systolic function is normal. Left Atrium: Left atrial size was moderately dilated. Right Atrium: Right atrial size was mildly dilated. Pericardium: There is no evidence of pericardial effusion. Mitral Valve: The mitral valve is normal in structure. There is moderate calcification of the mitral valve leaflet(s). Normal mobility of the mitral valve leaflets. Trivial mitral valve regurgitation. Tricuspid Valve: The tricuspid valve is normal in structure. Tricuspid valve regurgitation is trivial. Aortic Valve: The aortic valve is normal in structure. Aortic valve regurgitation is not visualized. Aortic valve sclerosis is present, with no evidence of aortic valve stenosis. Aortic valve mean gradient measures 8.0 mmHg. Aortic valve peak gradient measures 16.5 mmHg. Aortic valve area, by VTI measures 1.12 cm. Pulmonic Valve: The pulmonic valve was normal in structure. Pulmonic valve regurgitation is not visualized. Aorta: The ascending aorta was not well visualized. IAS/Shunts: No atrial level shunt detected by color flow Doppler. Additional Comments: 3D was performed not requiring image post processing on an independent workstation and was indeterminate.  LEFT VENTRICLE PLAX 2D LVIDd:         4.50 cm LVIDs:         3.60 cm LV PW:         0.90 cm LV IVS:        0.90 cm LVOT diam:     2.00 cm LV SV:         41 LV SV Index:   25 LVOT Area:     3.14 cm LV IVRT:       119 msec  LV Volumes (MOD) LV vol d, MOD A2C: 92.0 ml LV vol d, MOD A4C: 95.4 ml LV vol s, MOD A2C: 57.1 ml LV vol s, MOD A4C: 55.8 ml LV SV MOD A2C:     34.9 ml LV SV MOD A4C:     95.4 ml LV SV MOD BP:      37.8 ml RIGHT VENTRICLE             IVC RV Basal diam:  3.10 cm     IVC  diam: 1.10 cm RV S prime:     10.90 cm/s TAPSE (M-mode): 2.0 cm LEFT ATRIUM             Index        RIGHT ATRIUM           Index LA diam:        3.80 cm 2.27 cm/m   RA Area:     14.60 cm LA Vol (A2C):   75.4 ml 45.05 ml/m  RA Volume:   35.30 ml  21.09 ml/m LA Vol (A4C):   51.0 ml 30.47 ml/m LA Biplane Vol: 66.2 ml 39.55 ml/m  AORTIC VALVE AV Area (Vmax):    1.09 cm AV Area (Vmean):   1.25 cm AV Area (VTI):     1.12 cm AV Vmax:           202.80 cm/s AV Vmean:          127.800 cm/s AV  VTI:            0.367 m AV Peak Grad:      16.5 mmHg AV Mean Grad:      8.0 mmHg LVOT Vmax:         70.42 cm/s LVOT Vmean:        50.960 cm/s LVOT VTI:          0.131 m LVOT/AV VTI ratio: 0.36  AORTA Ao Root diam: 2.50 cm Ao Asc diam:  3.00 cm  SHUNTS Systemic VTI:  0.13 m Systemic Diam: 2.00 cm Cara JONETTA Lovelace MD Electronically signed by Cara JONETTA Lovelace MD Signature Date/Time: 12/29/2023/2:34:26 PM    Final    US  Venous Img Lower Bilateral (DVT) Result Date: 12/28/2023 CLINICAL DATA:  84 year old female with bilateral lower extremity pain. EXAM: BILATERAL LOWER EXTREMITY VENOUS DOPPLER ULTRASOUND TECHNIQUE: Gray-scale sonography with graded compression, as well as color Doppler and duplex ultrasound were performed to evaluate the lower extremity deep venous systems from the level of the common femoral vein and including the common femoral, femoral, profunda femoral, popliteal and calf veins including the posterior tibial, peroneal and gastrocnemius veins when visible. The superficial great saphenous vein was also interrogated. Spectral Doppler was utilized to evaluate flow at rest and with distal augmentation maneuvers in the common femoral, femoral and popliteal veins. COMPARISON:  None Available. FINDINGS: RIGHT LOWER EXTREMITY Common Femoral Vein: No evidence of thrombus. Normal compressibility, respiratory phasicity and response to augmentation. Saphenofemoral Junction: No evidence of thrombus. Normal  compressibility and flow on color Doppler imaging. Profunda Femoral Vein: No evidence of thrombus. Normal compressibility and flow on color Doppler imaging. Femoral Vein: No evidence of thrombus. Normal compressibility, respiratory phasicity and response to augmentation. Popliteal Vein: No evidence of thrombus. Normal compressibility, respiratory phasicity and response to augmentation. Calf Veins: No evidence of thrombus. Normal compressibility and flow on color Doppler imaging. Other Findings:  None. LEFT LOWER EXTREMITY Common Femoral Vein: No evidence of thrombus. Normal compressibility, respiratory phasicity and response to augmentation. Saphenofemoral Junction: No evidence of thrombus. Normal compressibility and flow on color Doppler imaging. Profunda Femoral Vein: No evidence of thrombus. Normal compressibility and flow on color Doppler imaging. Femoral Vein: No evidence of thrombus. Normal compressibility, respiratory phasicity and response to augmentation. Popliteal Vein: Nonocclusive, acute appearing focal thrombus within the popliteal vein. Calf Veins: Limited evaluation. No evidence of thrombus. Normal compressibility and flow on color Doppler imaging. Other Findings:  None. IMPRESSION: 1. Nonocclusive acute appearing deep vein thrombosis limited to the left popliteal vein. 2. Limited evaluation of the left calf veins. 3. No evidence of right lower extremity deep vein thrombosis. Ester Sides, MD [facility] Electronically Signed   By: Ester Sides M.D.   On: 12/28/2023 15:07   DG Chest 1 View Result Date: 12/27/2023 CLINICAL DATA:  Pulmonary edema EXAM: CHEST  1 VIEW COMPARISON:  Two days ago FINDINGS: The heart size and mediastinal contours are within normal limits. Stable left basilar opacities are noted concerning for subsegmental atelectasis possibly infiltrate. Minimal right basilar subsegmental atelectasis is noted. The visualized  skeletal structures are unremarkable. IMPRESSION: Stable left basilar opacities as described above. Electronically Signed   By: Lynwood Landy Raddle M.D.   On: 12/27/2023 14:58   DG Ankle Complete Left Result Date: 12/26/2023 CLINICAL DATA:  Fracture left lateral ankle pain. EXAM: LEFT ANKLE COMPLETE - 3+ VIEW COMPARISON:  Radiograph 02/24/2021 FINDINGS: No acute fracture. Normal ankle alignment, ankle mortise is preserved.  Suspected pes planus. Moderate tibial talar degenerative spurring. No erosive or bony destructive change. Small plantar calcaneal spur. No ankle joint effusion. Vascular calcifications are seen. IMPRESSION: 1. No acute fracture or dislocation of the left ankle. 2. Moderate tibiotalar degenerative spurring. Electronically Signed   By: Andrea Gasman M.D.   On: 12/26/2023 16:43   DG Chest 1 View Result Date: 12/25/2023 EXAM: 1 VIEW(S) XRAY OF THE CHEST 12/25/2023 01:56:33 PM COMPARISON: 12/23/2023 CLINICAL HISTORY: Pulmonary edema FINDINGS: LUNGS AND PLEURA: Low lung volumes. Stable streaky opacities in left lung base. No pleural effusion. No pneumothorax. HEART AND MEDIASTINUM: No acute abnormality of the cardiac and mediastinal silhouettes. BONES AND SOFT TISSUES: Lower thoracic vertebroplasty noted. IMPRESSION: 1. Unchanged streaky left basilar opacities. Electronically signed by: Michaeline Blanch MD 12/25/2023 02:02 PM EST RP Workstation: [id number]   DG Wrist Complete Left Result Date: 12/24/2023 EXAM: 3 OR MORE VIEW(S) XRAY OF THE LEFT WRIST 12/24/2023 02:31:00 PM COMPARISON: None available. CLINICAL HISTORY: Pain. No known injury. FINDINGS: BONES AND JOINTS: There is mild osteopenia. No acute fracture is evident. 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 There is bone on bone triscaphe joint arthrosis with a partial, likely degenerative lateral subluxation of the trapezium  relative to the scaphoid bone. 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 No further malalignment. SOFT TISSUES: There is generalized soft tissue swelling at the wrist. IMPRESSION: 1. No acute fracture. 2. Generalized soft tissue swelling. 3. Age indeterminate widening of the scapholunate interval. 4. Blunted appearance of the distal pole of the scaphoid bone, which may reflect a remote fracture with partial avascular necrosis. 5. Bone-on-bone triscaphe joint arthrosis with partial, likely degenerative lateral subluxation of the trapezium relative to the scaphoid bone. 6. 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 Electronically signed by: Francis Quam MD 12/24/2023 11:23 PM EST RP Workstation: [id number]   NM Pulmonary Perfusion Result Date: 12/23/2023 CLINICAL DATA:  Provided history: Pulmonary embolism (PE) suspected, low to intermediate prob, positive D-dimer Shortness of breath. EXAM: NUCLEAR MEDICINE PERFUSION LUNG SCAN TECHNIQUE: Perfusion images were obtained in multiple projections after intravenous injection of radiopharmaceutical. Ventilation scans intentionally deferred if perfusion scan and chest x-ray adequate for interpretation during COVID 19 epidemic. RADIOPHARMACEUTICALS:  4.4 mCi Tc-64m MAA IV COMPARISON:  None Available. FINDINGS: Homogeneous distribution of radiotracer. No peripheral or wedge-shaped perfusion defects. IMPRESSION: No scintigraphic findings of pulmonary embolus. Electronically Signed   By: Andrea Gasman M.D.   On: 12/23/2023 16:54   DG Chest 1 View Result Date: 12/23/2023 CLINICAL DATA:  Pneumonia. EXAM: CHEST  1 VIEW COMPARISON:  Radiograph 12/21/2023 FINDINGS: Resolved opacity in the periphery of the right lung. Increasing streaky opacities at the left lung base. Normal heart size with stable mediastinal contours. No pulmonary edema. No pleural effusion. No pneumothorax. IMPRESSION: 1. Resolved opacity in the  periphery of the right lung. 2. Increasing streaky opacities at the left lung base, favor atelectasis. Electronically Signed   By: Andrea Gasman M.D.   On: 12/23/2023 16:52   DG Chest 2 View Result Date: 12/21/2023 CLINICAL DATA:  Shortness of breath.  Weakness. EXAM: CHEST - 2 VIEW COMPARISON:  12/12/2023 FINDINGS: Stable heart size and mediastinal contours. Chronic elevation of right hemidiaphragm. Small opacity adjacent to elevated diaphragm was not seen on prior exam. No pulmonary edema, pleural effusion, or pneumothorax. Lower thoracic kyphoplasty again seen.  IMPRESSION: Small opacity adjacent to elevated right hemidiaphragm was not seen on prior exam, atelectasis versus pneumonia. Electronically Signed   By: Andrea Gasman M.D.   On: 12/21/2023 01:17    Microbiology: Results for orders placed or performed during the hospital encounter of 12/21/23  Resp panel by RT-PCR (RSV, Flu A&B, Covid) Anterior Nasal Swab     Status: None   Collection Time: 12/21/23 12:33 AM   Specimen: Anterior Nasal Swab  Result Value Ref Range Status   SARS Coronavirus 2 by RT PCR NEGATIVE NEGATIVE Final    Comment: (NOTE) SARS-CoV-2 target nucleic acids are NOT DETECTED.  The SARS-CoV-2 RNA is generally detectable in upper respiratory specimens during the acute phase of infection. The lowest concentration of SARS-CoV-2 viral copies this assay can detect is 138 copies/mL. A negative result does not preclude SARS-Cov-2 infection and should not be used as the sole basis for treatment or other patient management decisions. A negative result may occur with  improper specimen collection/handling, submission of specimen other than nasopharyngeal swab, presence of viral mutation(s) within the areas targeted by this assay, and inadequate number of viral copies(<138 copies/mL). A negative result must be combined with clinical observations, patient history, and epidemiological information. The expected result is  Negative.  Fact Sheet for Patients:  bloggercourse.com  Fact Sheet for Healthcare Providers:  seriousbroker.it  This test is no t yet approved or cleared by the [place]  FDA and  has been authorized for detection and/or diagnosis of SARS-CoV-2 by FDA under an Emergency Use Authorization (EUA). This EUA will remain  in effect (meaning this test can be used) for the duration of the COVID-19 declaration under Section 564(b)(1) of the Act, 21 U.S.C.section 360bbb-3(b)(1), unless the authorization is terminated  or revoked sooner.       Influenza A by PCR NEGATIVE NEGATIVE Final   Influenza B by PCR NEGATIVE NEGATIVE Final    Comment: (NOTE) The Xpert Xpress SARS-CoV-2/FLU/RSV plus assay is intended as an aid in the diagnosis of influenza from Nasopharyngeal swab specimens and should not be used as a sole basis for treatment. Nasal washings and aspirates are unacceptable for Xpert Xpress SARS-CoV-2/FLU/RSV testing.  Fact Sheet for Patients: bloggercourse.com  Fact Sheet for Healthcare Providers: seriousbroker.it  This test is not yet approved or cleared by the [place]  FDA and has been authorized for detection and/or diagnosis of SARS-CoV-2 by FDA under an Emergency Use Authorization (EUA). This EUA will remain in effect (meaning this test can be used) for the duration of the COVID-19 declaration under Section 564(b)(1) of the Act, 21 U.S.C. section 360bbb-3(b)(1), unless the authorization is terminated or revoked.     Resp Syncytial Virus by PCR NEGATIVE NEGATIVE Final    Comment: (NOTE) Fact Sheet for Patients: bloggercourse.com  Fact Sheet for Healthcare Providers: seriousbroker.it  This test is not yet approved or cleared by the [place]  FDA and has been authorized for detection and/or diagnosis of  SARS-CoV-2 by FDA under an Emergency Use Authorization (EUA). This EUA will remain in effect (meaning this test can be used) for the duration of the COVID-19 declaration under Section 564(b)(1) of the Act, 21 U.S.C. section 360bbb-3(b)(1), unless the authorization is terminated or revoked.  Performed at [facility], [address] [place], [place] [address]   Culture, blood (x 2)     Status: None   Collection Time: 12/21/23  6:52 AM   Specimen: BLOOD  Result Value Ref Range Status   Specimen Description BLOOD  LEFT ANTECUBITAL  Final   Special Requests   Final    BOTTLES DRAWN AEROBIC AND ANAEROBIC Blood Culture adequate volume   Culture   Final    NO GROWTH 5 DAYS Performed at [facility], [address] [place], [place] [address]    Report Status 12/26/2023 FINAL  Final  Culture, blood (x 2)     Status: None   Collection Time: 12/21/23  7:10 AM   Specimen: BLOOD  Result Value Ref Range Status   Specimen Description BLOOD BLOOD RIGHT ARM  Final   Special Requests   Final    BOTTLES DRAWN AEROBIC AND ANAEROBIC Blood Culture adequate volume   Culture   Final    NO GROWTH 5 DAYS Performed at [facility], [address] [place], [place] [address]    Report Status 12/26/2023 FINAL  Final    Labs: CBC: Recent Labs  Lab 01/09/24 0537 01/10/24 0601 01/14/24 0448  WBC 3.5* 3.7* 3.5*  NEUTROABS 2.0  --   --   HGB 10.2* 9.8* 10.5*  HCT 32.7* 30.6* 33.7*  MCV 109.7* 110.1* 110.9*  PLT 342 297 270   Basic Metabolic Panel: Recent Labs  Lab 01/11/24 0554 01/12/24 0758 01/13/24 0735 01/13/24 1330 01/14/24 0448  NA 140 140 141 141 140  K 4.1 4.3 4.2 4.7 4.3  CL 105 106 106 106 107  CO2 24 25 23 24 23   GLUCOSE 148* 148* 147* 140* 155*  BUN 39* 38* 38* 41* 43*  CREATININE 1.04* 0.90 0.88 1.04* 0.92  CALCIUM  9.4 9.2 9.7 9.8 9.3  MG  --  1.8 1.9  --  1.9   Liver Function Tests: No results for input(s): AST, ALT, ALKPHOS,  BILITOT, PROT, ALBUMIN  in the last 168 hours. CBG: Recent Labs  Lab 01/13/24 2323 01/14/24 0832 01/14/24 1212 01/14/24 1731 01/14/24 2122  GLUCAP 188* 152* 177* 134* 145*    Discharge time spent: greater than 30 minutes.  Signed: Elvan Sor, MD [facility] 01/15/2024 "

## 2024-01-13 NOTE — Progress Notes (Signed)
 Nutrition Follow-up  DOCUMENTATION CODES:   Obesity unspecified  INTERVENTION:   -Continue MVI with minerals daily -Continue carb modified diet -Continue Magic cup TID with meals, each supplement provides 290 kcal and 9 grams of protein  -Glucerna Shake po TID, each supplement provides 220 kcal and 10 grams of protein  -Continue 500 mg vitamin C  BID -Continue 220 mg zinc  sulfate daily x 14 days  NUTRITION DIAGNOSIS:   Inadequate oral intake related to acute illness as evidenced by meal completion < 25%.  Ongoing  GOAL:   Patient will meet greater than or equal to 90% of their needs  Progressing   MONITOR:   PO intake, Supplement acceptance, Labs, Weight trends, Skin, I & O's  REASON FOR ASSESSMENT:   Consult Assessment of nutrition requirement/status  ASSESSMENT:   84 y/o female with h/o CHF, asthma, HTN, HLD, DM, Afib, CKD, polycythemia and recent admission to [facility] for asthma exacerbation and who is now admitted with community-acquired pneumonia, asthma exacerbation, metapneumovirus infection, new DVT and AKI.  Reviewed I/O's: -160 ml x 24 hours and -3.6 L since 12/30/23  UOP: 400 ml x 24 hours   Patient receiving personal care at time of visit.   Per [facility] notes, patient with stage 2 pressure injury to right buttock.   Patient remains on a carb modified diet. Noted meal completions 60-100%. She is drinking her Glucerna shakes.   Reviewed weights; weights have ranged from 71.4-77.9 kg over the past 6 days.   Medications reviewed and include vitamin C , ativan , deamdex, and zinc  sulfate.   Per TOC notes, awaiting SNF placement for discharge.   Labs reviewed: CBGS: 140-203 (inpatient orders for glycemic control are 0-5 units insulin  aspart daily at bedtime, 0-9 units insulin  aspart TID with meals, 6 units insulin  aspart TID with meals, and 14 units insulin  glargine daily).    Diet Order:   Diet Order             Diet Carb Modified Room service appropriate?  Yes; Fluid restriction: 2000 mL Fluid  Diet effective now                   EDUCATION NEEDS:   Education needs have been addressed  Skin:  Skin Assessment: Skin Integrity Issues: Skin Integrity Issues:: Stage II Stage II: right buttocks  Last BM:  01/12/24 (type 4)  Height:   Ht Readings from Last 1 Encounters:  12/29/23 5' (1.524 m)    Weight:   Wt Readings from Last 1 Encounters:  01/12/24 76.7 kg    Ideal Body Weight:  45.45 kg  BMI:  Body mass index is 33.02 kg/m.  Estimated Nutritional Needs:   Kcal:  1500-1700kcal/day  Protein:  75-85g/day  Fluid:  1.3-1.5L/day    Margery ORN, RD, LDN, CDCES Registered Dietitian III Certified Diabetes Care and Education Specialist If unable to reach this RD, please use RD Inpatient group chat on secure chat between hours of 8am-4 pm daily

## 2024-01-13 NOTE — Progress Notes (Signed)
 "    Progress Note    Jacqueline Arroyo  FMW:[id number] DOB: 08-Sep-1940  DOA: 12/21/2023 PCP: [facility]      Brief Narrative:    Medical records reviewed and are as summarized below:  Jacqueline Arroyo is a 84 y.o. female  with medical history significant for mild on surveillance, diabetes, HTN, HLD, asthma, polycythemia vera, HFrEF with last EF was around 35 to 40% from echo in 2025 July, CKD who presented to ED at [facility] for worsening shortness of breath cough and generalized weakness.  Patient had a recent hospitalization at [facility] and was discharged from the hospital on 12/16/2023 after being diagnosed with bronchitis and exacerbation of asthma.  She presented on 12/19  with worsening respiratory symptoms after she went home along with some chills, generalized weakness and worsening nausea.  Admitted for possible community-acquired pneumonia, asthma exacerbation, metapneumovirus infection.       Assessment/Plan:   Principal Problem:   CAP (community acquired pneumonia) Active Problems:   HFrEF (heart failure with reduced ejection fraction) (HCC)   HTN (hypertension)   HLD (hyperlipidemia)   DM (diabetes mellitus) (HCC)   Asthma, chronic   Polycythemia vera (HCC)   Nutrition Problem: Inadequate oral intake Etiology: acute illness  Signs/Symptoms: meal completion < 25%   Body mass index is 33.02 kg/m.   Possible CAP  Asthma exacerbation  Metapnemovirus + Chronic hypoxic respiratory failure - 2L [place] Patient recently treated at [facility] for asthma exacerbation and viral bronchitis (metapneumovirus +). She was discharged with albuterol  and steroid course Presented with worsening SOB, elevated D-dimer, VQ neg for PE. CXR with small opacity adjacent to elevated diaphragm.  Completed abx course, prednisone  x 5 days Continue bronchodilators as needed    Left popliteal vein DVT Suspect due to immobility elevated D-dimer, VQ neg for PE US  lower extremity  shows nonocclusive acute DVT in left popliteal vein Continue Eliquis .  She is allergic to heparin /Lovenox     Acute on chronic HFmrEF Resolved Appears euvolemic  Echo shows EF 40 to 45%, LV global hypokinesis.  LA moderately dilated, trivial MR. Continue torsemide  from daily dosing to every other day dosing.  Continue metoprolol . S/p treatment with IV Lasix . 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  Serum creatinine mildly elevated this a.m.  Cr peaked 1.60    Latest Ref Rng & Units 01/12/2024    7:58 AM 01/11/2024    5:54 AM 01/10/2024    6:01 AM  BMP  Glucose 70 - 99 mg/dL 851  851  859   BUN 8 - 23 mg/dL 38  39  32   Creatinine 0.44 - 1.00 mg/dL 9.09  8.95  8.95   Sodium 135 - 145 mmol/L 140  140  139   Potassium 3.5 - 5.1 mmol/L 4.3  4.1  4.1   Chloride 98 - 111 mmol/L 106  105  106   CO2 22 - 32 mmol/L 25  24  24    Calcium  8.9 - 10.3 mg/dL 9.2  9.4  9.3   -Continue to monitor with daily BMP   Hyponatremia Resolved.     T2DM with hyperglycemia Steroid-induced hyperglycemia HbA1c 7.8 Completed prednisone  BG improved.  Continue Lantus  at 14 units daily and NovoLog  6 units 3 times daily with meals. Monitor glucose levels and adjust insulin  accordingly. CBG (last 3)  Recent Labs    01/12/24 1659 01/12/24 1937 01/12/24 2210  GLUCAP 157* 157* 189*  Acute urinary retention: Resolved  Bladder scan showed 935 mL of urine.  In and out urethral catheterization produced 1 L of urine on 01/06/2024.   She has been able to urinate spontaneously (multiple times) after transient urethral catheterization for urinary retention.    Left ankle osteoarthritis Has chronic pain XR ankle - no fracture Use Tylenol  as needed.  Patient takes courses of tramadol  intermittently outpatient Continue tramadol      Left wrist pain, left shoulder pain XR Left wrist no acute fracture History right shoulder showed mild acromioclavicular degenerative change,  mild subcortical cystic change in the lateral humeral head. Analgesics as needed for pain.   H/o Polycythemia vera  Hgb around 9.5. Noted to be 12-13 during recent hospitalization. No obvious bleeding noted, denies active bleeding/melena Iron panel consistent with ACD, b12 and folate WNL  Continue hydroxyurea  Per her last hematology oncology note, patient should be on 1 tab twice daily of hydroxyurea  Monday and Tuesday, and 1 tab daily Wednesday through Sunday, to minimize cytopenias Continue reduced dose schedule, will need this modified outpatient.     Constipation Laxatives as needed    Comorbidities include hypertension, hyperlipidemia  Awaiting placement to SNF Diet Order             Diet Carb Modified Room service appropriate? Yes; Fluid restriction: 2000 mL Fluid  Diet effective now                   Consultants: Nephrologist  Procedures: None    Medications:    apixaban  5 mg Oral BID   vitamin C  500 mg Oral BID   aspirin EC  81 mg Oral Daily   atorvastatin  10 mg Oral Daily   cycloSPORINE  1 drop Both Eyes BID   feeding supplement (GLUCERNA SHAKE)  237 mL Oral TID BM   fluticasone furoate-vilanterol  1 puff Inhalation Daily   insulin aspart  0-5 Units Subcutaneous QHS   insulin aspart  0-9 Units Subcutaneous TID WC   insulin aspart  6 Units Subcutaneous TID WC   insulin glargine  14 Units Subcutaneous Daily   leptospermum manuka honey  1 Application Topical BID   lidocaine  1 patch Transdermal Q24H   LORazepam  0.25 mg Oral BID   metoprolol succinate  50 mg Oral QHS   multivitamin with minerals  1 tablet Oral Daily   pantoprazole  40 mg Oral BID AC   torsemide  10 mg Oral QODAY   zinc sulfate (50mg elemental zinc)  220 mg Oral Daily   Continuous Infusions:   Anti-infectives (From admission, onward)    Start     Dose/Rate Route Frequency Ordered Stop   12/24/23 1000  azithromycin (ZITHROMAX) tablet 500 mg        500 mg Oral Daily 12/23/23  1220 12/25/23 1001   12/21/23 0745  cefTRIAXone (ROCEPHIN) 2 g in sodium chloride 0.9 % 100 mL IVPB  Status:  Discontinued        2 g 200 mL/hr over 30 Minutes Intravenous Every 24 hours 12/21/23 0744 12/21/23 0746   12/21/23 0745  azithromycin (ZITHROMAX) 500 mg in sodium chloride 0.9 % 250 mL IVPB  Status:  Discontinued        50 0 mg 250 mL/hr over 60 Minutes Intravenous Every 24 hours 12/21/23 0744 12/21/23 0746   12/21/23 0630  cefTRIAXone  (ROCEPHIN ) 2 g in sodium chloride  0.9 % 100 mL IVPB        2 g 200 mL/hr  over 30 Minutes Intravenous Every 24 hours 12/21/23 0629 12/25/23 9386   12/21/23 0630  azithromycin  (ZITHROMAX ) 500 mg in sodium chloride  0.9 % 250 mL IVPB  Status:  Discontinued        500 mg 250 mL/hr over 60 Minutes Intravenous Every 24 hours 12/21/23 0629 12/23/23 1220              Family Communication/Anticipated D/C date and plan/Code Status   DVT prophylaxis: SCDs Start: 12/21/23 0740 apixaban  (ELIQUIS ) tablet 5 mg     Code Status: Limited: Do not attempt resuscitation (DNR) -DNR-LIMITED -Do Not Intubate/DNI   Family Communication: None Disposition Plan: Plan to discharge to SNF awaiting ins approval.    Status is: Inpatient Remains inpatient appropriate because: SNF in the AM .     Subjective:   Interval events noted.  Having some numbness in median nerve distribution of her L hand. States she may have hit this. She did have a wrist splint ordered unclear if she had this on.   Objective:    Vitals:   01/12/24 0828 01/12/24 1804 01/12/24 1933 01/13/24 0339  BP: (!) 122/58 (!) 121/54 (!) 124/54 (!) 132/57  Pulse: 89 96 100 82  Resp: 16 17 20 18   Temp: 98.5 F (36.9 C) 97.6 F (36.4 C) 98.4 F (36.9 C) 98.7 F (37.1 C)  TempSrc: Oral Oral    SpO2: 93% 96% 93% 93%  Weight:      Height:       No data found.   Intake/Output Summary (Last 24 hours) at 01/13/2024 0829 Last data filed at 01/12/2024 1933 Gross per 24 hour  Intake 240 ml   Output 400 ml  Net -160 ml     Filed Weights   01/10/24 0434 01/11/24 0500 01/12/24 0500  Weight: 71.7 kg 71.4 kg 76.7 kg    Exam:   Constitutional: In no distress.  Chronically ill-appearing Cardiovascular: Normal rate, regular rhythm. No lower extremity edema  Pulmonary: Non labored breathing on nasal cannula, no wheezing or rales.   Abdominal: Soft. Non distended and non tender Musculoskeletal: Normal range of motion.     Neurological: Alert and oriented to person, place, and time. Non focal  Skin: Skin is warm and dry.   Data Reviewed:   I have personally reviewed following labs and imaging studies:  Labs: Labs show the following:   Basic Metabolic Panel: Recent Labs  Lab 01/09/24 0537 01/10/24 0601 01/11/24 0554 01/12/24 0758  NA 138 139 140 140  K 4.3 4.1 4.1 4.3  CL 102 106 105 106  CO2 25 24 24 25   GLUCOSE 156* 140* 148* 148*  BUN 32* 32* 39* 38*  CREATININE 0.96 1.04* 1.04* 0.90  CALCIUM  9.7 9.3 9.4 9.2  MG  --   --   --  1.8   GFR Estimated Creatinine Clearance: 41.8 mL/min (by C-G formula based on SCr of 0.9 mg/dL). Liver Function Tests: No results for input(s): AST, ALT, ALKPHOS, BILITOT, PROT, ALBUMIN  in the last 168 hours. No results for input(s): LIPASE, AMYLASE in the last 168 hours. No results for input(s): AMMONIA in the last 168 hours. Coagulation profile No results for input(s): INR, PROTIME in the last 168 hours.  CBC: Recent Labs  Lab 01/07/24 0536 01/09/24 0537 01/10/24 0601  WBC 3.5* 3.5* 3.7*  NEUTROABS  --  2.0  --   HGB 10.1* 10.2* 9.8*  HCT 32.2* 32.7* 30.6*  MCV 109.5* 109.7* 110.1*  PLT 361 342 297  Cardiac Enzymes: No results for input(s): CKTOTAL, CKMB, CKMBINDEX, TROPONINI in the last 168 hours. BNP (last 3 results) Recent Labs    12/22/23 1247 12/25/23 1343 01/09/24 0537  PROBNP 418.0* 6,107.0* 699.0*   CBG: Recent Labs  Lab 01/12/24 0828 01/12/24 1157 01/12/24 1659  01/12/24 1937 01/12/24 2210  GLUCAP 150* 140* 157* 157* 189*   D-Dimer: No results for input(s): DDIMER in the last 72 hours. Hgb A1c: No results for input(s): HGBA1C in the last 72 hours. Lipid Profile: No results for input(s): CHOL, HDL, LDLCALC, TRIG, CHOLHDL, LDLDIRECT in the last 72 hours. Thyroid function studies: No results for input(s): TSH, T4TOTAL, T3FREE, THYROIDAB in the last 72 hours.  Invalid input(s): FREET3 Anemia work up: No results for input(s): VITAMINB12, FOLATE, FERRITIN, TIBC, IRON, RETICCTPCT in the last 72 hours. Sepsis Labs: Recent Labs  Lab 01/07/24 0536 01/09/24 0537 01/10/24 0601  WBC 3.5* 3.5* 3.7*    Microbiology No results found for this or any previous visit (from the past 240 hours).  Procedures and diagnostic studies:  No results found.    LOS: 23 days   Alban Pepper MD [facility]   Pager on [email or web address]. If 7PM-7AM, please contact night-coverage at [email or web address]    01/13/2024, 8:29 AM           "

## 2024-01-14 LAB — BASIC METABOLIC PANEL WITH GFR
Anion gap: 10 (ref 5–15)
BUN: 43 mg/dL — ABNORMAL HIGH (ref 8–23)
CO2: 23 mmol/L (ref 22–32)
Calcium: 9.3 mg/dL (ref 8.9–10.3)
Chloride: 107 mmol/L (ref 98–111)
Creatinine, Ser: 0.92 mg/dL (ref 0.44–1.00)
GFR, Estimated: >60 mL/min
Glucose, Bld: 155 mg/dL — ABNORMAL HIGH (ref 70–99)
Potassium: 4.3 mmol/L (ref 3.5–5.1)
Sodium: 140 mmol/L (ref 135–145)

## 2024-01-14 LAB — IRON AND TIBC
Iron: 64 ug/dL (ref 28–170)
Saturation Ratios: 25 % (ref 10.4–31.8)
TIBC: 259 ug/dL (ref 250–450)
UIBC: 195 ug/dL

## 2024-01-14 LAB — CBC
HCT: 33.7 % — ABNORMAL LOW (ref 36.0–46.0)
Hemoglobin: 10.5 g/dL — ABNORMAL LOW (ref 12.0–15.0)
MCH: 34.5 pg — ABNORMAL HIGH (ref 26.0–34.0)
MCHC: 31.2 g/dL (ref 30.0–36.0)
MCV: 110.9 fL — ABNORMAL HIGH (ref 80.0–100.0)
Platelets: 270 K/uL (ref 150–400)
RBC: 3.04 MIL/uL — ABNORMAL LOW (ref 3.87–5.11)
RDW: 14.3 % (ref 11.5–15.5)
WBC: 3.5 K/uL — ABNORMAL LOW (ref 4.0–10.5)
nRBC: 0 % (ref 0.0–0.2)

## 2024-01-14 LAB — GLUCOSE, CAPILLARY
Glucose-Capillary: 152 mg/dL — ABNORMAL HIGH (ref 70–99)
Glucose-Capillary: 177 mg/dL — ABNORMAL HIGH (ref 70–99)
Glucose-Capillary: 134 mg/dL — ABNORMAL HIGH (ref 70–99)
Glucose-Capillary: 145 mg/dL — ABNORMAL HIGH (ref 70–99)

## 2024-01-14 LAB — MAGNESIUM: Magnesium: 1.9 mg/dL (ref 1.7–2.4)

## 2024-01-14 MED ORDER — MAGNESIUM CHLORIDE 64 MG PO TBEC
2.0000 | DELAYED_RELEASE_TABLET | Freq: Once | ORAL | Status: AC
  Administered 2024-01-14: 128 mg via ORAL
  Filled 2024-01-14: qty 2

## 2024-01-14 MED ORDER — MAGNESIUM SULFATE 2 GM/50ML IV SOLN
2.0000 g | Freq: Once | INTRAVENOUS | Status: DC
  Filled 2024-01-14: qty 50

## 2024-01-14 NOTE — TOC Progression Note (Signed)
 Transition of Care [facility]) - Progression Note    Patient Details  Name: Jacqueline Arroyo MRN: [id number] Date of Birth: Mar 13, 1940  [facility] CM/SW Contact  Dalia GORMAN Fuse, RN Phone Number: 01/14/2024, 10:19 AM  Clinical Narrative:     Ins auth received for [employer]. [facility] will likely have a bed tomorrow.  TOC will continue to follow.   Expected Discharge Plan: Home w Home Health Services Barriers to Discharge: Continued Medical Work up               Expected Discharge Plan and Services In-house Referral: Clinical Social Work Discharge Planning Services: CM Consult   Living arrangements for the past 2 months: Apartment                                       Social Drivers of Health (SDOH) Interventions SDOH Screenings   Food Insecurity: Patient Declined (12/22/2023)  Housing: Patient Declined (12/22/2023)  Transportation Needs: Patient Declined (12/22/2023)  Utilities: Patient Declined (12/22/2023)  Financial Resource Strain: Low Risk (12/16/2023)   Received from [facility]  Physical Activity: Insufficiently Active (06/20/2023)   Received from [facility]  Social Connections: Patient Declined (12/22/2023)  Stress: No Stress Concern Present (06/20/2023)   Received from [facility]  Tobacco Use: Low Risk (12/21/2023)  Health Literacy: Low Risk (06/20/2023)   Received from [facility]    Readmission Risk Interventions    12/31/2023    4:35 PM  Readmission Risk Prevention Plan  Transportation Screening Complete  PCP or Specialist Appt within 5-7 Days Complete  Home Care Screening Complete  Medication Review (RN CM) Complete

## 2024-01-14 NOTE — Plan of Care (Signed)
" °  Problem: Education: Goal: Ability to describe self-care measures that may prevent or decrease complications (Diabetes Survival Skills Education) will improve Outcome: Progressing   Problem: Coping: Goal: Ability to adjust to condition or change in health will improve Outcome: Progressing   Problem: Nutritional: Goal: Maintenance of adequate nutrition will improve Outcome: Progressing   Problem: Skin Integrity: Goal: Risk for impaired skin integrity will decrease Outcome: Progressing   Problem: Tissue Perfusion: Goal: Adequacy of tissue perfusion will improve Outcome: Progressing   Problem: Pain Managment: Goal: General experience of comfort will improve and/or be controlled Outcome: Progressing   Problem: Safety: Goal: Ability to remain free from injury will improve Outcome: Progressing   Problem: Skin Integrity: Goal: Risk for impaired skin integrity will decrease Outcome: Progressing   "

## 2024-01-14 NOTE — Progress Notes (Signed)
 Physical Therapy Treatment Patient Details Name: Jacqueline Arroyo MRN: [id number] DOB: 12/30/1940 Today's Date: 01/14/2024   History of Present Illness Pt is a pleasant 84 y.o. 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 MD assessment includes:  Possible CAP, asthma exacerbation, metapnemovirus, AKI.    PT Comments  Pt received in bed, had just returned from chair due to back pain from sitting up. Therefore, focused session on strengthening of B LE's with good tolerance. Will continue to progress functional mobility. Potential transfer to STR tomorrow.    If plan is discharge home, recommend the following: Assistance with cooking/housework;Assist for transportation;Two people to help with walking and/or transfers;A lot of help with bathing/dressing/bathroom   Can travel by private vehicle     No  Equipment Recommendations  Other (comment) (TBD at next level of care)    Recommendations for Other Services       Precautions / Restrictions Precautions Precautions: Fall Recall of Precautions/Restrictions: Intact Precaution/Restrictions Comments: L wrist splint for pain control, prn Required Braces or Orthoses: Splint/Cast Splint/Cast: LUE/ wrist Restrictions Weight Bearing Restrictions Per Provider Order: No Other Position/Activity Restrictions: Chronic bilateral ankle pain     Mobility  Bed Mobility               General bed mobility comments:  (declined)    Transfers                   General transfer comment: declined    Ambulation/Gait               General Gait Details: declined   Optometrist     Tilt Bed    Modified Rankin (Stroke Patients Only)       Balance Overall balance assessment: Needs  assistance Sitting-balance support: Feet supported Sitting balance-Leahy Scale: Fair     Standing balance support: Bilateral upper extremity supported, During functional activity, Reliant on assistive device for balance Standing balance-Leahy Scale: Fair Standing balance comment:  (Reliant on RW bed to chair transfers with CGA)                            Communication Communication Communication: No apparent difficulties  Cognition Arousal: Alert Behavior During Therapy: WFL for tasks assessed/performed, Flat affect   PT - Cognitive impairments: No apparent impairments                       PT - Cognition Comments: Pt is A and O x 4. Daughter present at bedside. Following commands: Intact      Cueing Cueing Techniques: Verbal cues, Tactile cues  Exercises General Exercises - Lower Extremity Ankle Circles/Pumps: AROM, Both, 15 reps, Supine Gluteal Sets: AROM, 10 reps, Supine Heel Slides: AROM, Both, 10 reps, Supine Hip ABduction/ADduction: AROM, Both, 10 reps Straight Leg Raises: AAROM, Both, 10 reps, Supine Other Exercises Other Exercises:  (LTR x 10) Other Exercises: Hip ABD/ADD in semi-supine x 10    General Comments General comments (skin integrity, edema, etc.): Pt educated on role of acute PT and upcoming transition to STR possibly tomorrow      Pertinent Vitals/Pain Pain Assessment Pain Assessment: Faces Faces Pain Scale: Hurts  a little bit Pain Location:  (back) Pain Descriptors / Indicators: Sore Pain Intervention(s): Limited activity within patient's tolerance    Home Living                          Prior Function            PT Goals (current goals can now be found in the care plan section) Acute Rehab PT Goals Patient Stated Goal: rehab then home    Frequency    Min 2X/week      PT Plan      Co-evaluation              AM-PAC PT 6 Clicks Mobility   Outcome Measure  Help needed turning from your  back to your side while in a flat bed without using bedrails?: A Lot Help needed moving from lying on your back to sitting on the side of a flat bed without using bedrails?: A Little Help needed moving to and from a bed to a chair (including a wheelchair)?: A Little Help needed standing up from a chair using your arms (e.g., wheelchair or bedside chair)?: A Little Help needed to walk in hospital room?: Total Help needed climbing 3-5 steps with a railing? : Total 6 Click Score: 13    End of Session Equipment Utilized During Treatment: Oxygen Activity Tolerance: Patient tolerated treatment well Patient left: in bed;with call bell/phone within reach;with bed alarm set Nurse Communication: Mobility status PT Visit Diagnosis: Difficulty in walking, not elsewhere classified (R26.2);Muscle weakness (generalized) (M62.81);Pain Pain - Right/Left: Left Pain - part of body: Ankle and joints of foot     Time: 1445-1458 PT Time Calculation (min) (ACUTE ONLY): 13 min  Charges:    $Therapeutic Exercise: 8-22 mins PT General Charges $$ ACUTE PT VISIT: 1 Visit                    Darice Bohr, PTA  [name] 01/14/2024, 4:07 PM

## 2024-01-15 LAB — GLUCOSE, CAPILLARY
Glucose-Capillary: 197 mg/dL — ABNORMAL HIGH (ref 70–99)
Glucose-Capillary: 163 mg/dL — ABNORMAL HIGH (ref 70–99)

## 2024-01-15 NOTE — Progress Notes (Signed)
 [facility] Progress Note   Non Billable note  Patient: Jacqueline Arroyo    FMW:[id number]  DOA: 12/21/2023     Date of Service: the patient was seen and examined on 01/15/2024  Chief Complaint  Patient presents with   Weakness      Brief hospital course:  Jacqueline Arroyo is a 84 y.o. female  with medical history significant for mild on surveillance, diabetes, HTN, HLD, asthma, polycythemia vera, HFrEF with last EF was around 35 to 40% from echo in 2025 July, CKD who presented to ED at [facility] for worsening shortness of breath cough and generalized weakness.  Patient had a recent hospitalization at [facility] and was discharged from the hospital on 12/16/2023 after being diagnosed with bronchitis and exacerbation of asthma.  She presented on 12/19  with worsening respiratory symptoms after she went home along with some chills, generalized weakness and worsening nausea.  Admitted for possible community-acquired pneumonia, asthma exacerbation, metapneumovirus infection.     Assessment and Plan: Possible CAP  Asthma exacerbation  Metapnemovirus + Chronic hypoxic respiratory failure - 2L [place] Patient recently treated at [facility] for asthma exacerbation and viral bronchitis (metapneumovirus +). She was discharged with albuterol  and steroid course Presented with worsening SOB, elevated D-dimer, VQ neg for PE. CXR with small opacity adjacent to elevated diaphragm.  Completed abx course, prednisone  x 5 days Continue bronchodilators as needed     Left popliteal vein DVT Suspect due to immobility elevated D-dimer, VQ neg for PE US  lower extremity shows nonocclusive acute DVT in left popliteal vein Continue Eliquis .  She is allergic to heparin /Lovenox      Acute on chronic HFmrEF Resolved Appears euvolemic  Echo shows EF 40 to 45%, LV global hypokinesis.  LA moderately dilated, trivial MR. Continue torsemide  from daily dosing to every other day dosing.  Continue metoprolol . S/p  treatment with IV Lasix . 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  Serum creatinine mildly elevated this a.m.  Cr peaked 1.60     Hyponatremia Resolved.      T2DM with hyperglycemia Steroid-induced hyperglycemia HbA1c 7.8 Completed prednisone  BG improved.  She will continue lantus  14 u on discharge and will continue her home antidiabetes medication. This will be further titrated out of the hospital.  CBG (last 3)  Recent Labs (last 2 labs)       Recent Labs    01/14/24 1212 01/14/24 1731 01/14/24 2122  GLUCAP 177* 134* 145*            Acute urinary retention: Resolved  Bladder scan showed 935 mL of urine.  In and out urethral catheterization produced 1 L of urine on 01/06/2024.   She has been able to urinate spontaneously (multiple times) after transient urethral catheterization for urinary retention.       Left ankle osteoarthritis Has chronic pain XR ankle - no fracture Use Tylenol  as needed.  Patient takes courses of tramadol  intermittently outpatient Continue tramadol       Left wrist pain, left shoulder pain XR Left wrist no acute fracture History right shoulder showed mild acromioclavicular degenerative change, mild subcortical cystic change in the lateral humeral head. L wrist splint at night.  Analgesics as needed for pain.     H/o Polycythemia vera  Hgb around 9.5. Noted to be 12-13 during recent hospitalization. No obvious bleeding noted, denies active bleeding/melena Iron panel consistent with ACD, b12 and folate WNL  Continue hydroxyurea  Per her last hematology  oncology note, patient should be on 1 tab twice daily of hydroxyurea  Monday and Tuesday, and 1 tab daily Wednesday through Sunday, to minimize cytopenias Continue reduced dose schedule      Constipation Laxatives as needed     Comorbidities include hypertension, hyperlipidemia   Body mass index is 33.58 kg/m.  Nutrition Problem: Inadequate oral  intake Etiology: acute illness Interventions: Interventions: Ensure Enlive (each supplement provides 350kcal and 20 grams of protein), MVI, Magic cup  Wound 01/02/24 1100 Pressure Injury Buttocks Right Stage 2 -  Partial thickness loss of dermis presenting as a shallow open injury with a red, pink wound bed without slough. (Active)     Diet: Carb modified diet DVT Prophylaxis: Eliquis   Advance goals of care discussion: DNR-limited  Family Communication: family was not present at bedside, at the time of interview.  The pt provided permission to discuss medical plan with the family. Opportunity was given to ask question and all questions were answered satisfactorily.   Disposition:  Pt is from home, admitted with pneumonia. Patient was stable and awaiting for SNF discharge.  Discharge were done yesterday on 01/14/2024 but SNF facility has no beds available so patient could not go yesterday. 1/14 today patient seems stable so patient will be discharged.  Subjective: No significant events overnight.  Patient was laying comfortably in the bed. Denied any active issues, no complaints. Patient felt comfortable going to the rehab.  Physical Exam: General: NAD, lying comfortably Appear in no distress, affect appropriate Eyes: PERRLA ENT: Oral Mucosa Clear, moist  Neck: no JVD,  Cardiovascular: S1 and S2 Present, no Murmur,  Respiratory: good respiratory effort, Bilateral Air entry equal and Decreased, no Crackles, no wheezes Abdomen: Bowel Sound present, Soft and no tenderness,  Skin: no rashes Extremities: no Pedal edema, no calf tenderness Neurologic: without any new focal findings Gait not checked due to patient safety concerns  Vitals:   01/14/24 2018 01/15/24 0426 01/15/24 0500 01/15/24 0750  BP: (!) 114/59 138/70  124/70  Pulse: (!) 110 (!) 45  88  Resp:  18  16  Temp: 98.1 F (36.7 C) 97.6 F (36.4 C)  97.7 F (36.5 C)  TempSrc:    Oral  SpO2: 94% 95%  95%  Weight:   78  kg   Height:        Intake/Output Summary (Last 24 hours) at 01/15/2024 1049 Last data filed at 01/15/2024 0900 Gross per 24 hour  Intake 600 ml  Output 1200 ml  Net -600 ml   Filed Weights   01/12/24 0500 01/14/24 0343 01/15/24 0500  Weight: 76.7 kg 79.3 kg 78 kg    Data Reviewed: I have personally reviewed and interpreted daily labs, tele strips, imagings as discussed above. I reviewed all nursing notes, pharmacy notes, vitals, pertinent old records I have discussed plan of care as described above with RN and patient/family.  CBC: Recent Labs  Lab 01/09/24 0537 01/10/24 0601 01/14/24 0448  WBC 3.5* 3.7* 3.5*  NEUTROABS 2.0  --   --   HGB 10.2* 9.8* 10.5*  HCT 32.7* 30.6* 33.7*  MCV 109.7* 110.1* 110.9*  PLT 342 297 270   Basic Metabolic Panel: Recent Labs  Lab 01/11/24 0554 01/12/24 0758 01/13/24 0735 01/13/24 1330 01/14/24 0448  NA 140 140 141 141 140  K 4.1 4.3 4.2 4.7 4.3  CL 105 106 106 106 107  CO2 24 25 23 24 23   GLUCOSE 148* 148* 147* 140* 155*  BUN 39* 38* 38* 41*  43*  CREATININE 1.04* 0.90 0.88 1.04* 0.92  CALCIUM  9.4 9.2 9.7 9.8 9.3  MG  --  1.8 1.9  --  1.9    Studies: No results found.  Scheduled Meds:  acetaminophen   650 mg Oral Q6H   Or   acetaminophen   650 mg Rectal Q6H   apixaban   5 mg Oral BID   vitamin C   500 mg Oral BID   aspirin  EC  81 mg Oral Daily   atorvastatin   10 mg Oral Daily   cycloSPORINE   1 drop Both Eyes BID   feeding supplement (GLUCERNA SHAKE)  237 mL Oral TID BM   fluticasone  furoate-vilanterol  1 puff Inhalation Daily   insulin  aspart  0-5 Units Subcutaneous QHS   insulin  aspart  0-9 Units Subcutaneous TID WC   insulin  aspart  6 Units Subcutaneous TID WC   insulin  glargine  14 Units Subcutaneous Daily   leptospermum manuka honey  1 Application Topical BID   lidocaine   1 patch Transdermal Q24H   LORazepam   0.25 mg Oral BID   metoprolol  succinate  50 mg Oral QHS   multivitamin with minerals  1 tablet Oral Daily    pantoprazole   40 mg Oral BID AC   torsemide   10 mg Oral QODAY   zinc  sulfate (50mg  elemental zinc )  220 mg Oral Daily   Continuous Infusions: PRN Meds: bisacodyl , diclofenac  Sodium, guaiFENesin , ipratropium-albuterol , ondansetron  **OR** ondansetron  (ZOFRAN ) IV, mouth rinse, polyethylene glycol, traMADol   Time spent: zero minutes. Non billable note. Patient was awaiting to be discharged today.  Stable to DC to SNF.  Author: ELVAN SOR. MD [facility] 01/15/2024 10:49 AM  To reach On-call, see care teams to locate the attending and reach out to them via [email or web address]. If 7PM-7AM, please contact night-coverage If you still have difficulty reaching the attending provider, please page the [facility] (Director on Call) for [facility] on amion for assistance.

## 2024-01-15 NOTE — Progress Notes (Signed)
 Mobility Specialist Progress Note:    01/15/24 1112  Mobility  Activity Stood with assistance;Pivoted/transferred to/from [facility];Pivoted/transferred from bed to chair  Level of Assistance Minimal assist, patient does 75% or more  Assistive Device Front wheel walker  Distance Ambulated (ft) 3 ft  Range of Motion/Exercises Active;All extremities  Activity Response Tolerated well  Mobility visit 1 Mobility  Mobility Specialist Start Time (ACUTE ONLY) 1057  Mobility Specialist Stop Time (ACUTE ONLY) 1112  Mobility Specialist Time Calculation (min) (ACUTE ONLY) 15 min   Pt received in bed, agreeable to mobility. Required MinA +2 to stand and transfer with RW. Tolerated well, pt able to take small steps towards BSC/chair. Left in chair with NT for peri care/bath. All needs met.  Sherrilee Ditty Mobility Specialist Please contact via Special Educational Needs Teacher or  Rehab office at [phone]

## 2024-01-15 NOTE — Progress Notes (Signed)
 Report has been called to [facility] [phone] RM [phone]

## 2024-01-15 NOTE — TOC Transition Note (Addendum)
 Transition of Care [facility]) - Discharge Note   Patient Details  Name: Jacqueline Arroyo MRN: [id number] Date of Birth: 21-Jan-1940  Transition of Care [facility]) CM/SW Contact:  Dalia GORMAN Fuse, RN Phone Number: 01/15/2024, 9:12 AM   Clinical Narrative:     Patient is medically clear to discharge to [facility] today. [facility] to transport   Nurse to call report to Emmalene Hertz [phone] RM 106B  Final next level of care: Skilled Nursing Facility Barriers to Discharge: Barriers Resolved   Patient Goals and CMS Choice Patient states their goals for this hospitalization and ongoing recovery are:: Go home          Discharge Placement              Patient chooses bed at: [facility] Patient to be transferred to facility by: [facility] Name of family member notified: Daughter [name] Patient and family notified of of transfer: 01/15/24  Discharge Plan and Services Additional resources added to the After Visit Summary for   In-house Referral: Clinical Social Work Discharge Planning Services: CM Consult                                 Social Drivers of Health (SDOH) Interventions SDOH Screenings   Food Insecurity: Patient Declined (12/22/2023)  Housing: Patient Declined (12/22/2023)  Transportation Needs: Patient Declined (12/22/2023)  Utilities: Patient Declined (12/22/2023)  Financial Resource Strain: Low Risk (12/16/2023)   Received from [facility]  Physical Activity: Insufficiently Active (06/20/2023)   Received from [facility]  Social Connections: Patient Declined (12/22/2023)  Stress: No Stress Concern Present (06/20/2023)   Received from [facility]  Tobacco Use: Low Risk (12/21/2023)  Health Literacy: Low Risk (06/20/2023)   Received from [facility]     Readmission Risk Interventions    12/31/2023    4:35 PM  Readmission Risk Prevention Plan  Transportation Screening Complete  PCP or Specialist Appt within 5-7 Days Complete  Home  Care Screening Complete  Medication Review (RN CM) Complete

## 2024-01-28 DIAGNOSIS — C884 MALT (mucosa associated lymphoid tissue) (CMS-HCC): Principal | ICD-10-CM

## 2024-02-11 IMAGING — CR DG ANKLE COMPLETE 3+V*L*
4 series · 4 of 4 positions shown · non-contrast
Comparison: None.

CLINICAL DATA: Ankle pain twisting about the toe.

EXAM:
LEFT ANKLE COMPLETE - 3+ VIEW

[ankle ap]
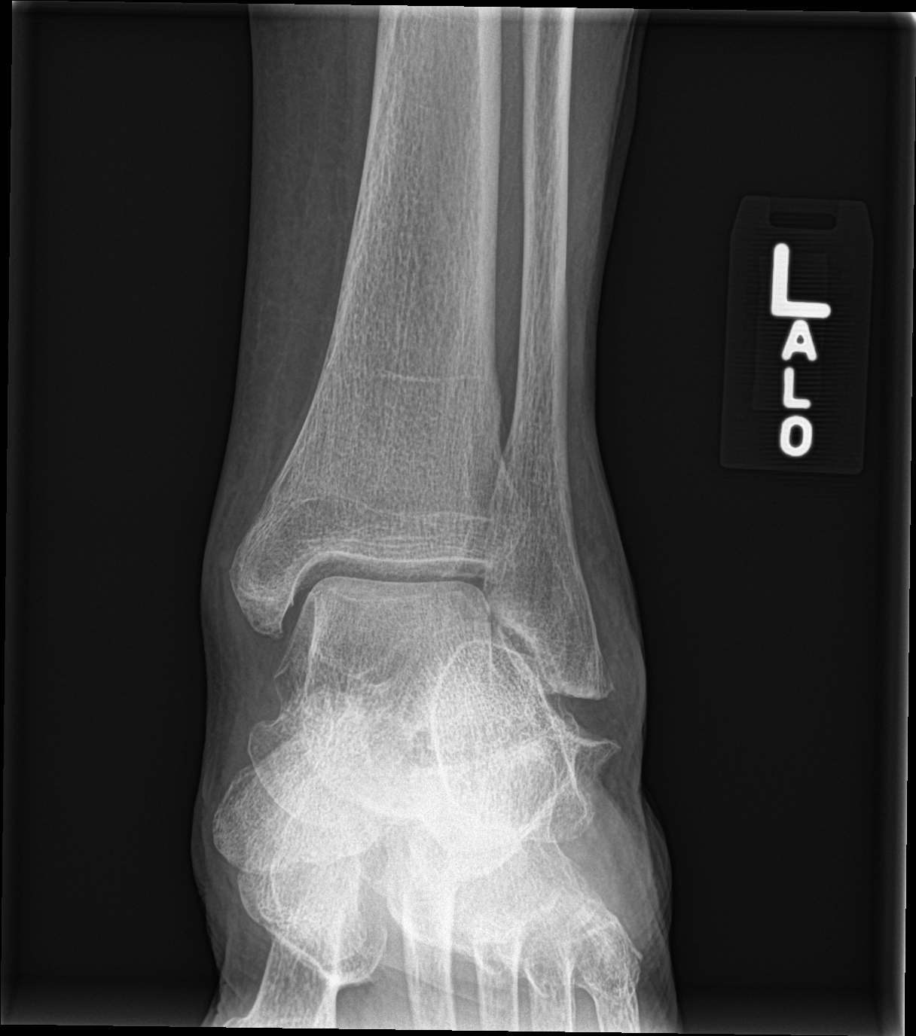

[ankle obl (1 of 2)]
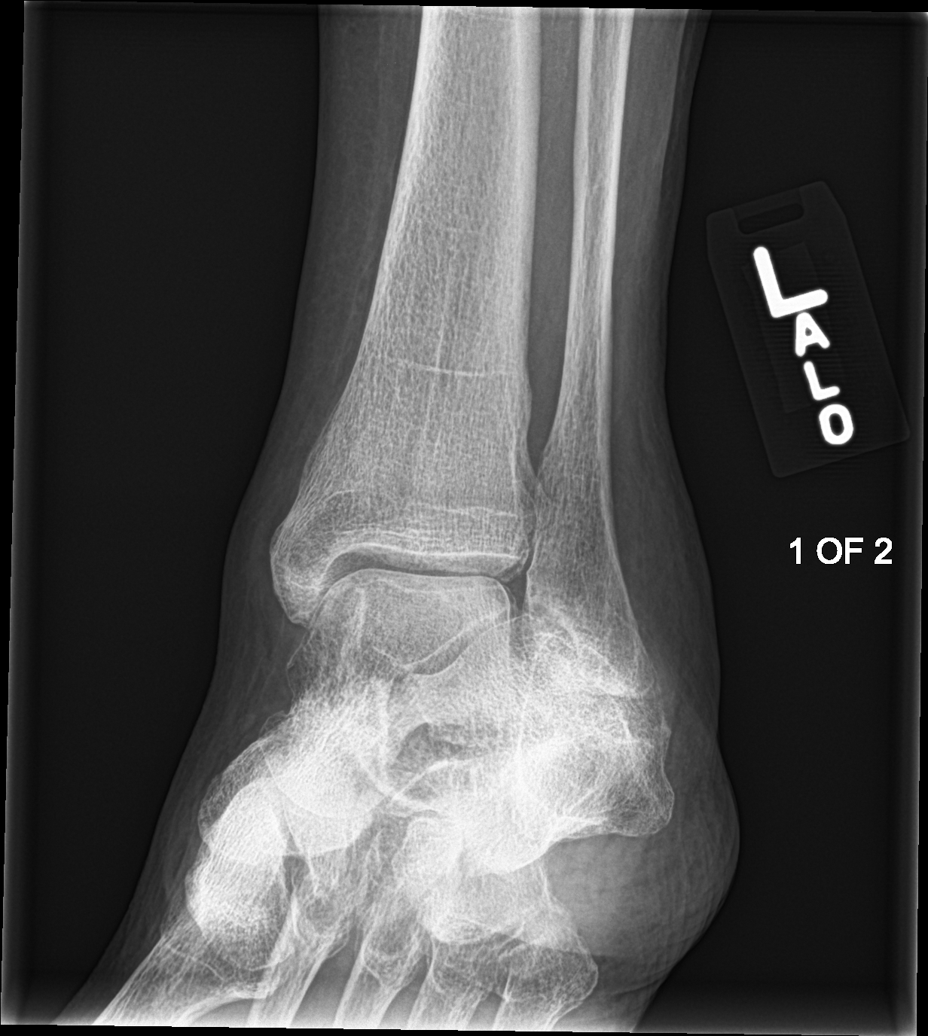

[ankle lat]
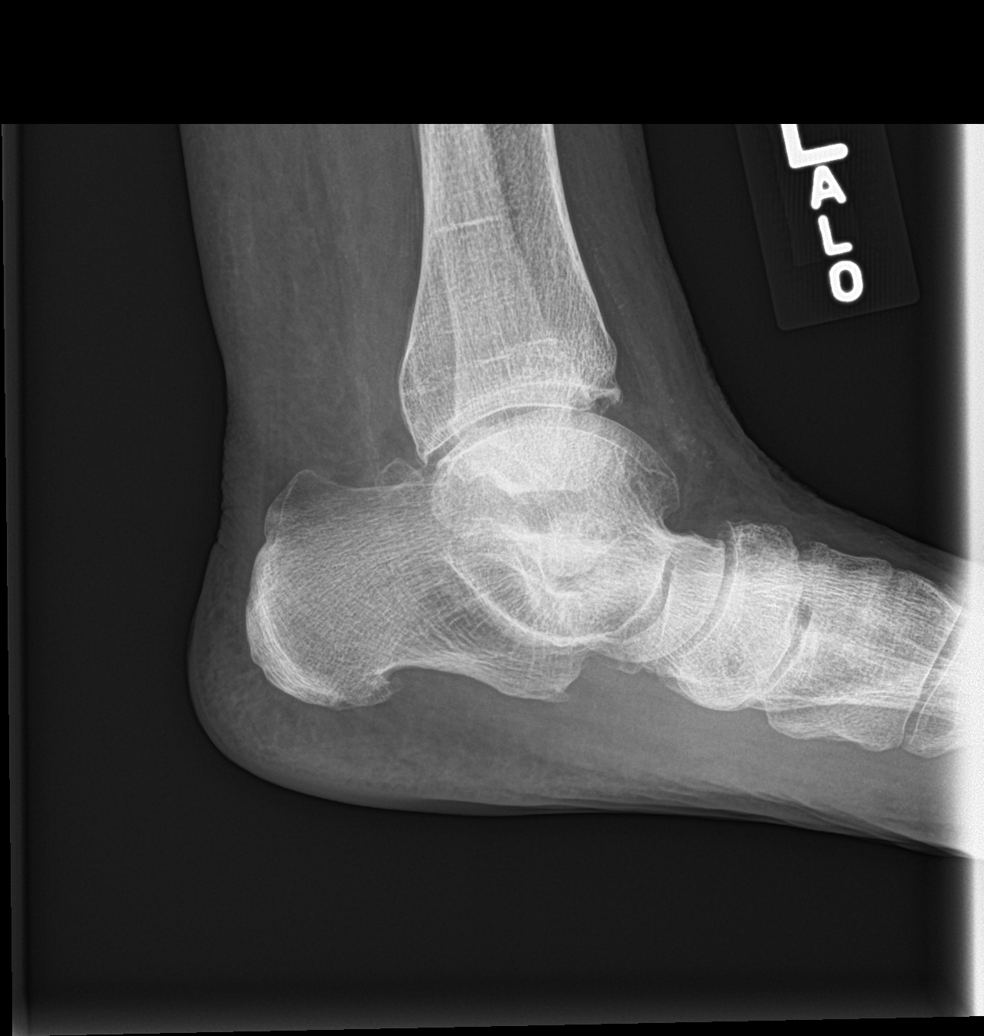

[ankle obl (2 of 2)]
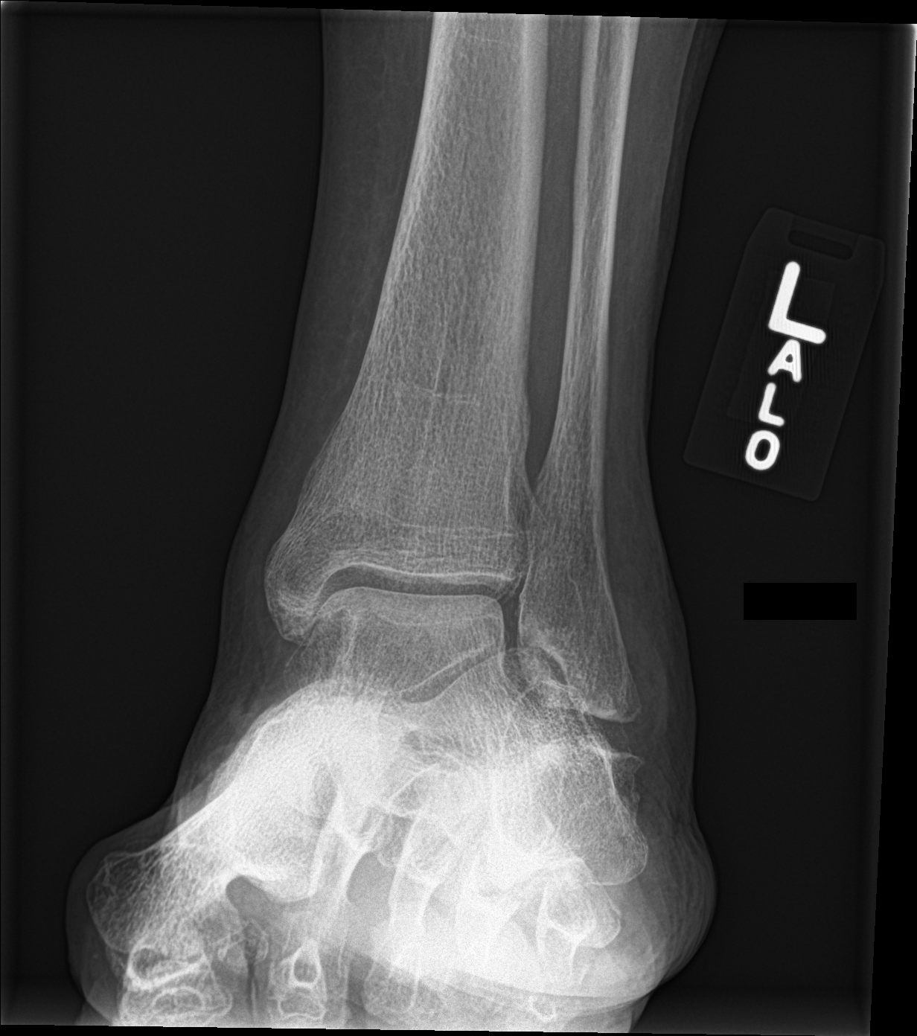

[4 of 4 positions shown; findings below may reference images not displayed]

FINDINGS: There is no evidence of fracture, dislocation, or joint effusion.
Degenerative changes of the tibiotalar and talonavicular joints.
Small plantar calcaneal spur. Soft tissue swelling about the ankle.
Osteopenia. Vascular calcifications.
IMPRESSION: 1.  No acute fracture or dislocation.

2. Degenerative changes of the tibiotalar and talonavicular joints.
Osteopenia.

3.  Soft tissue swelling about the ankle.

## 2024-02-11 IMAGING — CR DG FOOT COMPLETE 3+V*L*
4 series · 4 of 4 positions shown · non-contrast
Comparison: None.

CLINICAL DATA: Left foot/ankle twisting injury 1 week ago with left
foot and ankle pain.

EXAM:
LEFT FOOT - COMPLETE 3+ VIEW

[foot ap]
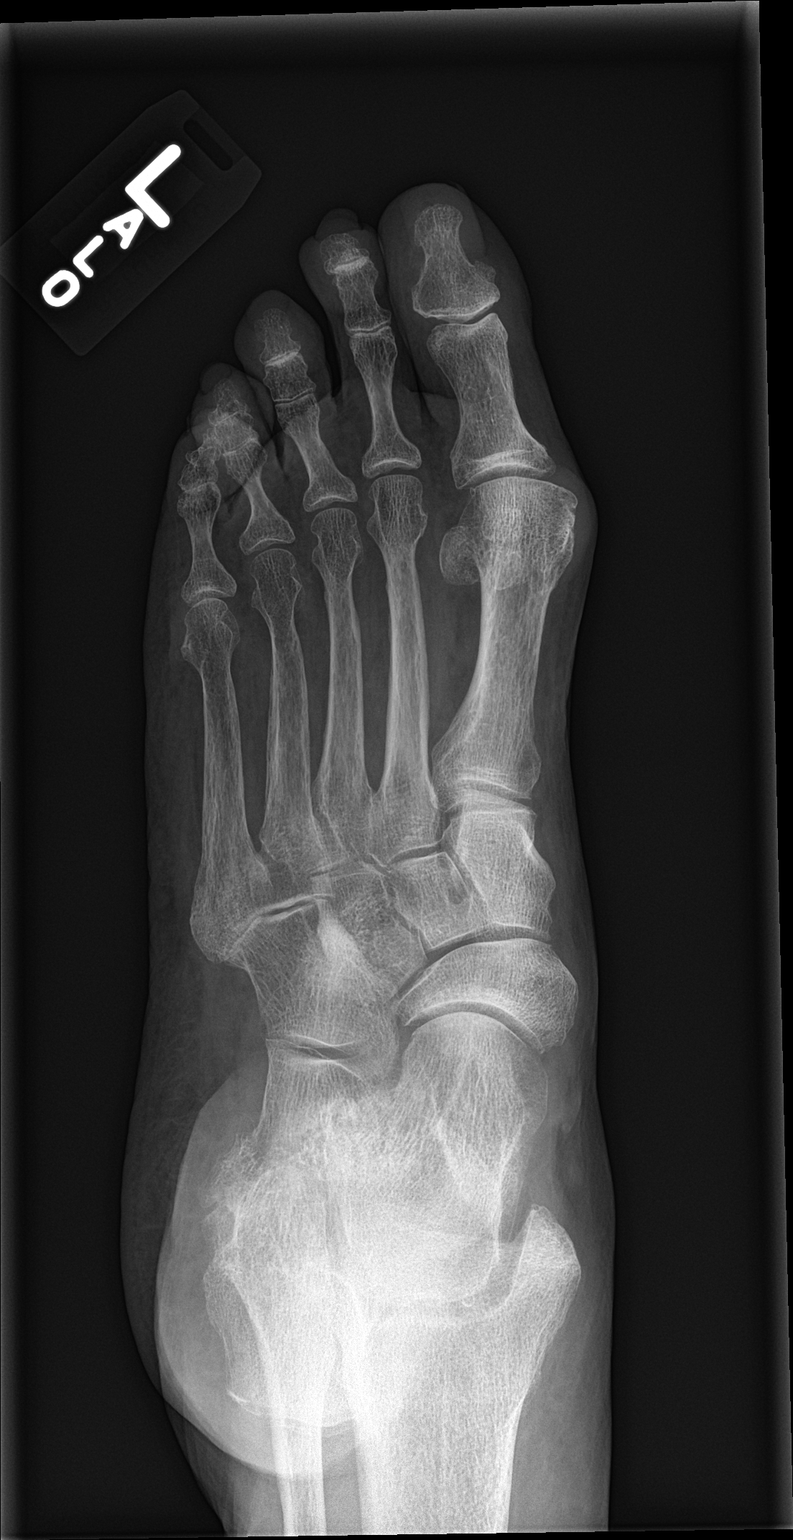

[foot obl]
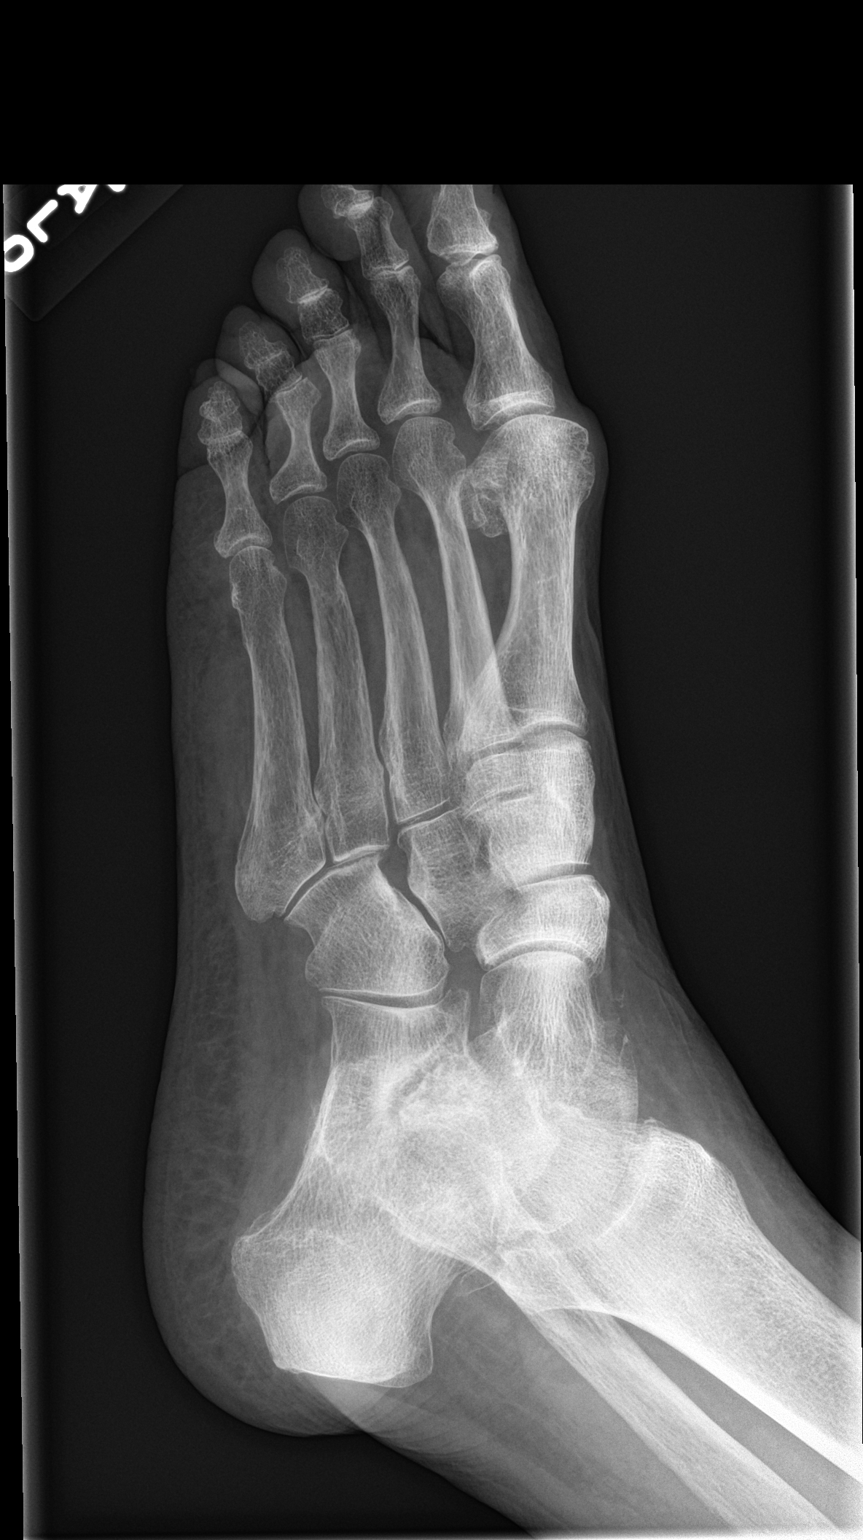

[foot lat (1 of 2)]
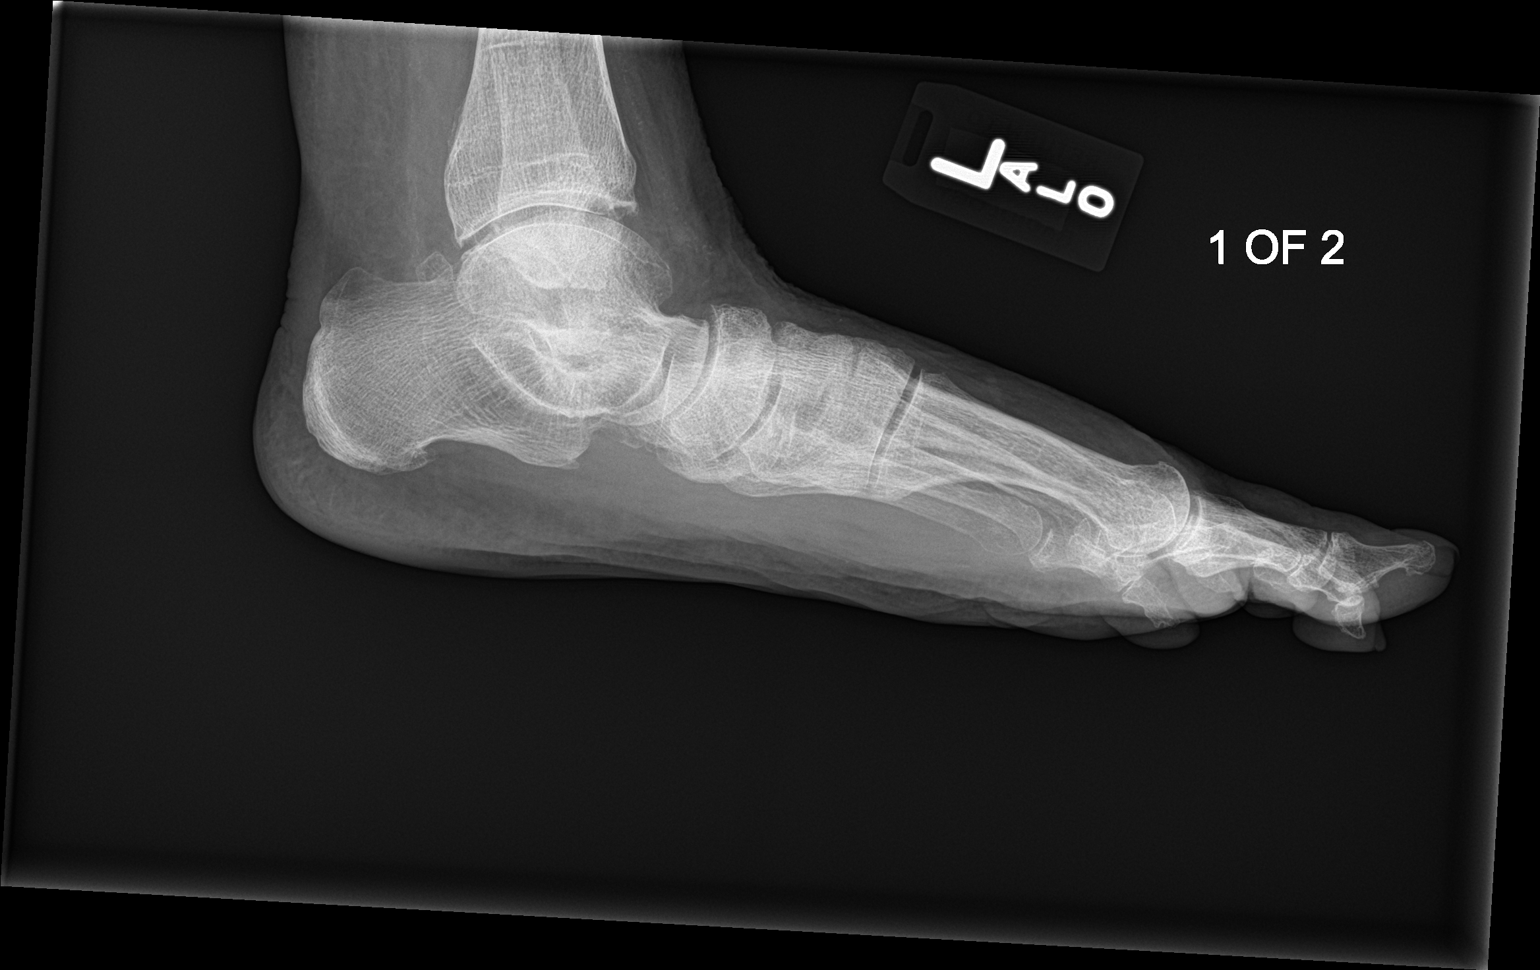

[foot lat (2 of 2)]
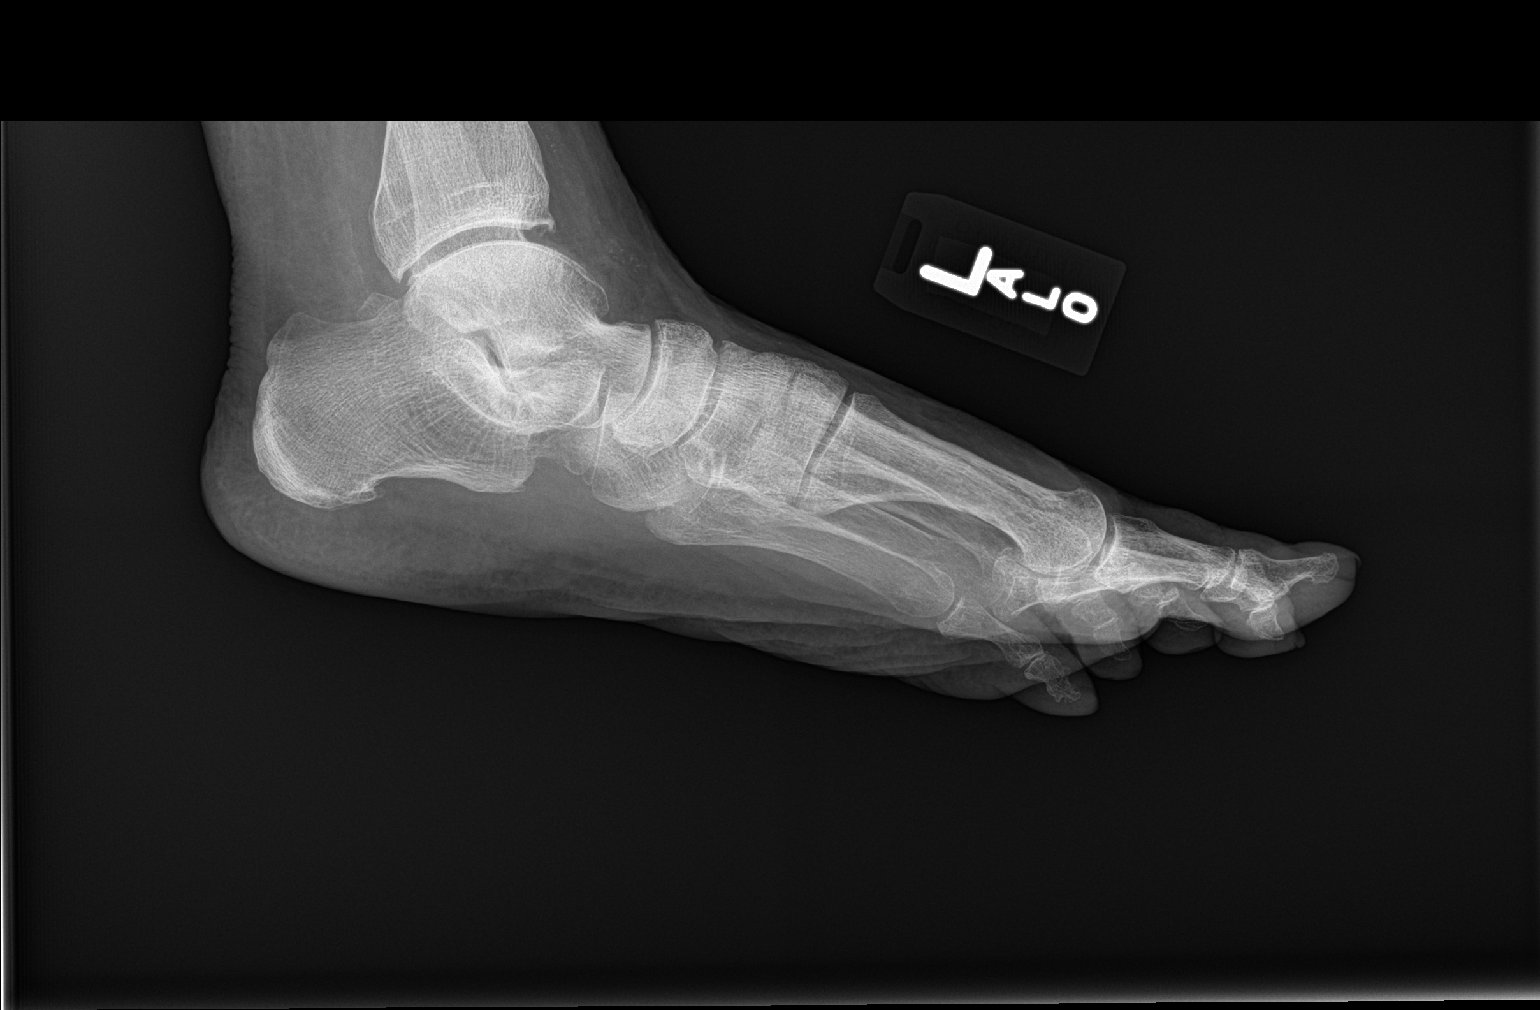

[4 of 4 positions shown; findings below may reference images not displayed]

FINDINGS: Minimal degenerate change of the first MTP joint and throughout the
interphalangeal joints. Mild degenerate change over the midfoot and
hindfoot. Mild pes planus deformity. Small inferior calcaneal spur.
Minimal degenerate change over the tibiotalar joint. No acute
fracture or dislocation. No significant soft tissue injury.
IMPRESSION: 1. No acute findings.
2. Mild degenerative changes as described.
